# Patient Record
Sex: Male | Born: 1950
Health system: Southern US, Community
[De-identification: ages and names within clinical notes are randomized; demographics above are authoritative.]

## PROBLEM LIST (undated history)

## (undated) DIAGNOSIS — Z9861 Coronary angioplasty status: Secondary | ICD-10-CM

## (undated) DIAGNOSIS — J45909 Unspecified asthma, uncomplicated: Secondary | ICD-10-CM

## (undated) DIAGNOSIS — I2510 Atherosclerotic heart disease of native coronary artery without angina pectoris: Secondary | ICD-10-CM

## (undated) DIAGNOSIS — J069 Acute upper respiratory infection, unspecified: Secondary | ICD-10-CM

## (undated) DIAGNOSIS — E119 Type 2 diabetes mellitus without complications: Secondary | ICD-10-CM

## (undated) DIAGNOSIS — I214 Non-ST elevation (NSTEMI) myocardial infarction: Secondary | ICD-10-CM

## (undated) DIAGNOSIS — G629 Polyneuropathy, unspecified: Secondary | ICD-10-CM

## (undated) DIAGNOSIS — G709 Myoneural disorder, unspecified: Secondary | ICD-10-CM

## (undated) DIAGNOSIS — Z789 Other specified health status: Secondary | ICD-10-CM

## (undated) DIAGNOSIS — R0602 Shortness of breath: Secondary | ICD-10-CM

## (undated) DIAGNOSIS — F329 Major depressive disorder, single episode, unspecified: Secondary | ICD-10-CM

## (undated) DIAGNOSIS — N400 Enlarged prostate without lower urinary tract symptoms: Secondary | ICD-10-CM

## (undated) DIAGNOSIS — K219 Gastro-esophageal reflux disease without esophagitis: Secondary | ICD-10-CM

## (undated) DIAGNOSIS — E785 Hyperlipidemia, unspecified: Secondary | ICD-10-CM

## (undated) DIAGNOSIS — R350 Frequency of micturition: Secondary | ICD-10-CM

## (undated) DIAGNOSIS — M1990 Unspecified osteoarthritis, unspecified site: Secondary | ICD-10-CM

## (undated) DIAGNOSIS — F32A Depression, unspecified: Secondary | ICD-10-CM

## (undated) DIAGNOSIS — J209 Acute bronchitis, unspecified: Secondary | ICD-10-CM

## (undated) HISTORY — PX: MULTIPLE TOOTH EXTRACTIONS: SHX2053

## (undated) HISTORY — DX: Hyperlipidemia, unspecified: E78.5

## (undated) HISTORY — PX: HERNIA REPAIR: SHX51

## (undated) HISTORY — DX: Benign prostatic hyperplasia without lower urinary tract symptoms: N40.0

## (undated) HISTORY — PX: BACK SURGERY: SHX140

## (undated) HISTORY — PX: FINGER SURGERY: SHX640

## (undated) HISTORY — PX: CARDIAC CATHETERIZATION: SHX172

---

## 2003-08-22 ENCOUNTER — Encounter: Admission: RE | Admit: 2003-08-22 | Discharge: 2003-10-10 | Payer: Self-pay | Admitting: *Deleted

## 2003-11-15 ENCOUNTER — Emergency Department (HOSPITAL_COMMUNITY): Admission: EM | Admit: 2003-11-15 | Discharge: 2003-11-15 | Payer: Self-pay | Admitting: Emergency Medicine

## 2007-08-29 ENCOUNTER — Emergency Department (HOSPITAL_COMMUNITY): Admission: EM | Admit: 2007-08-29 | Discharge: 2007-08-29 | Payer: Self-pay | Admitting: Emergency Medicine

## 2007-09-19 ENCOUNTER — Encounter (INDEPENDENT_AMBULATORY_CARE_PROVIDER_SITE_OTHER): Payer: Self-pay | Admitting: General Surgery

## 2007-09-19 ENCOUNTER — Ambulatory Visit (HOSPITAL_COMMUNITY): Admission: RE | Admit: 2007-09-19 | Discharge: 2007-09-19 | Payer: Self-pay | Admitting: General Surgery

## 2010-12-30 NOTE — Consult Note (Signed)
Arthur Smith, Arthur Smith NO.:  [id number]   MEDICAL RECORD NO.:  [id number]          PATIENT TYPE:  EMS   LOCATION:  ED                           FACILITY:  [facility]   PHYSICIAN:  Lennie Muckle, MD      DATE OF BIRTH:  Dec 23, 1950   DATE OF CONSULTATION:  DATE OF DISCHARGE:                                 CONSULTATION   REASON FOR CONSULTATION:  Umbilical hernia.   HISTORY OF PRESENT ILLNESS:  Arthur Smith is a 60 year old male who has  had a longstanding history of an umbilical hernia.  Apparently in the  past couple of days he began having more severe abdominal pain and began  having redness within the past 24 hours of his umbilicus.  He has had no  nausea or vomiting.Marland Kitchen  He has had normal bowel movements and no fevers.  He has been performing abdominal exercises and weight loss recently. He  has been uncomfortable in the past 24 hours and during the pain he  sought treatment at his primary care physician's office and was then  instructed to report to the surgical office but due to a  miscommunication he came to the emergency room at [facility].   PAST MEDICAL HISTORY:  His past medical history is significant for  idiopathic neuropathy and chronic back pain.   PAST SURGICAL HISTORY:  None.   SOCIAL HISTORY:  Tobacco use: None.  He also has no history of alcohol  use.   MEDICATIONS:  Medications include Gabapentin 800 mg 4 times a day,  imipramine 25 mg at bedtime, Neurontin 100 mg 4 times a day, Ultram 50  mg 4 times a day, Vicodin as needed, and Zonegran 100 mg at bedtime.   PHYSICAL EXAMINATION:  GENERAL:  On physical examination he is a  pleasant male, lying on a stretcher, in no acute distress.  VITAL SIGNS:  Temperature is 98.2, blood pressure 137/97.  Pulse is 84.  CHEST: Clear to auscultation bilaterally.  CARDIOVASCULAR:  Regular rate and rhythm.  ABDOMEN:  Obese, is soft. He is tender in the umbilical region.  There  is erythema noted.  An  umbilical hernia is present, approximately a cm  to 2 cm, is not able to be fully reduced to evaluate full size.  No  rebound tenderness or peritoneal signs are noted.   ASSESSMENT AND PLAN:  Umbilical hernia, likely with omental adhesions,  incarcerated.  Agree with CT scan for further assessment just to rule  out bowel within the hernia.  I am doubtful that there is any intestinal  compromise since he is currently stable and has no peritoneal signs.  If  the CT scan does show intestine within the hernia may  have to entertain surgery intervention earlier than later.  The CT does  only show omentum.  The patient can be discharged home with narcotics  and follow up with the surgery clinic, either myself, Dr. Daphine Deutscher, or  other partners, for discussion of repair at that time.Lennie Muckle, MD  Electronically Signed  ALA/MEDQ  D:  08/29/2007  T:  08/30/2007  Job:  [id number]   cc:   Thora Lance, M.D.  Fax: [phone]

## 2010-12-30 NOTE — Op Note (Signed)
Arthur Smith, Arthur Smith               ACCOUNT NO.:  [id number]   MEDICAL RECORD NO.:  [id number]          PATIENT TYPE:  AMB   LOCATION:  DAY                          FACILITY:  [facility]   PHYSICIAN:  Lennie Muckle, MD      DATE OF BIRTH:  Apr 16, 1951   DATE OF PROCEDURE:  DATE OF DISCHARGE:                               OPERATIVE REPORT   DATE OF SURGERY:  09/19/2007   PREOPERATIVE DIAGNOSIS:  Umbilical hernia.   POSTOPERATIVE DIAGNOSIS:  Umbilical hernia with incarcerated omentum.   PROCEDURE:  1. Diagnostic laparoscopy.  2. Open hernia repair.   SURGEON:  Lennie Muckle, MD   ASSISTANT:  General endotracheal anesthesia.   FINDINGS:  A small less than 1 cm defect at the umbilical region with an  incarcerated omentum.   SPECIMEN:  Omentum was removed and sent to Pathology.   ESTIMATED BLOOD LOSS:  Minimal.   COMPLICATIONS:  None immediate.   DRAINS:  None placed.   INDICATIONS FOR PROCEDURE:  Arthur Smith is a 60 year old male whom I had  seen in the emergency department due to abdominal pain and erythema in  the umbilical region.  He was found to have an umbilical hernia on CT  scan which had an omentum within the hernia.  He was treated with  antibiotics due to erythema at the umbilical region discussed in the  office performing a laparoscopic umbilical hernia repair.  Due to his  abdominal girth, I did think he would be better served with the  laparoscopic approach.  Informed consent was obtained from the patient.   DETAILS OF PROCEDURE:  Arthur Smith was identified in the preoperative  holding suite.  He was given heparin before being taken to the operating  room.  He also received IV cefazolin.  Once in the operating room, he  was placed in the supine position and placed under general endotracheal  anesthesia.  A Foley catheter was placed.  Once his abdomen was prepped  and draped in the usual sterile fashion, a time out procedure indicating  the patient and procedure  that were performed.  After insuring  orogastric tube was in position, a Veress needle was placed in the left  upper quadrant.  Pneumoperitoneum was established.  A 5 mm trocar using  Optiview was placed in the left side of the abdomen.  There was no  evidence of injury upon placement of the trocar or the Veress needle.  The Veress needle was removed, and a 5 mm trocar was placed in the left  upper quadrant.  The mid trocar was upsized to a 11 mm trocar.  An  additional 5 mm trocar was placed in the left lower quadrant.  Upon  inspection of the abdominal cavity, there was an omental adhesion to the  umbilical region.  This was taken down with blunt dissection.  There did  seem to be a small twisting piece of omentum up into the umbilical  hernia site.  This was easily transected.  I further inspected the  abdominal wall.  The lesion appeared to be less than a  centimeter in  size by laparoscopy.  There was a hardness noted at the umbilical region  which was unable to be reduced.  I attempted to remove the omentum  within the umbilical site.  However, despite multiple attempts at  removing the omentum at the umbilical region,  I was unsuccesful.  Due  to the size of the defect and the omentum remaining at the hernia site,  I elected to perform an open procedure.  An incision was placed at the  umbilicus.  Umbilical stalk was dissected completely around.  The  omentum was able to be removed from the hernia site.  This was removed  from the operative field and passed off for pathology review.  I then  fully cleaned the fascial surrounding the defect.  A fingertip was  placed within the defect that was approximately 1 cm in size.  Using 0  Nurolon sutures, I repaired it primarily in an interrupted fashion.  After closure, the abdomen once again was insufflated, hernia repair was  inspected.  There was no evidence of bleeding throughout the abdominal  cavity.  The 11 mm trocar site was closed  with 0 Vicryl suture using  laparoscopic suture passer.  The peritoneum was then released.  The  umbilicus was tacked down to the fascia using a 3-0 Vicryl suture.  The  skin was closed with 4-0 Monocryl.  2 x 2s and Tegaderm were placed in  the umbilical region.  Steri-strips for final dressing on the remaining  port site.  The patient was extubated, Foley catheter removed, and taken  to the postanesthesia care unit  in stable condition.  He will be  discharged home with instructions to perform no heavy lifting greater  than 20 pounds for six weeks.  He will follow up with me in  approximately two or three weeks' time.  He may shower tomorrow and was  given Percocet for pain.      Lennie Muckle, MD  Electronically Signed     ALA/MEDQ  D:  09/19/2007  T:  09/19/2007  Job:  [id number]

## 2011-05-07 LAB — BASIC METABOLIC PANEL
BUN: 11
CO2: 22
Calcium: 8.5
Chloride: 104
Creatinine, Ser: 1.07
GFR calc Af Amer: >60
GFR calc non Af Amer: >60
Glucose, Bld: 82
Potassium: 3.5
Sodium: 135

## 2011-05-07 LAB — CBC
HCT: 40.5
Hemoglobin: 14.2
MCHC: 35.1
MCV: 86.7
Platelets: 250
RBC: 4.67
RDW: 13.4
WBC: 7.5

## 2011-05-07 LAB — DIFFERENTIAL
Basophils Absolute: 0
Basophils Relative: 1
Eosinophils Absolute: 0.2
Eosinophils Relative: 3
Lymphocytes Relative: 33
Lymphs Abs: 2.4
Monocytes Absolute: 0.8
Monocytes Relative: 11
Neutro Abs: 4
Neutrophils Relative %: 53

## 2011-09-01 ENCOUNTER — Other Ambulatory Visit: Payer: Self-pay | Admitting: Internal Medicine

## 2011-09-01 DIAGNOSIS — R1011 Right upper quadrant pain: Secondary | ICD-10-CM

## 2011-09-02 ENCOUNTER — Ambulatory Visit
Admission: RE | Admit: 2011-09-02 | Discharge: 2011-09-02 | Disposition: A | Discharge status: Home / Self Care | Payer: No Typology Code available for payment source | Source: Ambulatory Visit | Attending: Internal Medicine | Admitting: Internal Medicine

## 2011-09-02 DIAGNOSIS — R1011 Right upper quadrant pain: Secondary | ICD-10-CM

## 2011-09-21 ENCOUNTER — Other Ambulatory Visit: Payer: Self-pay | Admitting: Internal Medicine

## 2011-09-23 ENCOUNTER — Ambulatory Visit
Admission: RE | Admit: 2011-09-23 | Discharge: 2011-09-23 | Disposition: A | Discharge status: Home / Self Care | Payer: No Typology Code available for payment source | Source: Ambulatory Visit | Attending: Internal Medicine | Admitting: Internal Medicine

## 2011-09-23 MED ORDER — IOHEXOL 300 MG/ML  SOLN
125.0000 mL | Freq: Once | INTRAMUSCULAR | Status: AC | PRN
  Administered 2011-09-23: 125 mL via INTRAVENOUS

## 2011-10-22 ENCOUNTER — Other Ambulatory Visit: Payer: Self-pay | Admitting: Gastroenterology

## 2011-11-03 ENCOUNTER — Other Ambulatory Visit (HOSPITAL_COMMUNITY): Payer: Self-pay | Admitting: Gastroenterology

## 2011-11-03 DIAGNOSIS — K635 Polyp of colon: Secondary | ICD-10-CM

## 2011-11-16 DIAGNOSIS — J209 Acute bronchitis, unspecified: Secondary | ICD-10-CM

## 2011-11-16 DIAGNOSIS — J45909 Unspecified asthma, uncomplicated: Secondary | ICD-10-CM

## 2011-11-16 HISTORY — DX: Unspecified asthma, uncomplicated: J20.9

## 2011-11-16 HISTORY — DX: Acute bronchitis, unspecified: J45.909

## 2011-11-23 ENCOUNTER — Other Ambulatory Visit: Payer: Self-pay | Admitting: Neurosurgery

## 2011-11-24 ENCOUNTER — Encounter (HOSPITAL_COMMUNITY): Payer: Self-pay | Admitting: Pharmacy Technician

## 2011-11-30 ENCOUNTER — Inpatient Hospital Stay (HOSPITAL_COMMUNITY): Admission: RE | Admit: 2011-11-30 | Payer: No Typology Code available for payment source | Source: Ambulatory Visit

## 2011-12-02 ENCOUNTER — Encounter (HOSPITAL_COMMUNITY)
Admission: RE | Admit: 2011-12-02 | Discharge: 2011-12-02 | Disposition: A | Discharge status: Home / Self Care | Payer: No Typology Code available for payment source | Source: Ambulatory Visit | Attending: Neurosurgery | Admitting: Neurosurgery

## 2011-12-02 ENCOUNTER — Encounter (HOSPITAL_COMMUNITY): Payer: Self-pay

## 2011-12-02 ENCOUNTER — Encounter (HOSPITAL_COMMUNITY)
Admission: RE | Admit: 2011-12-02 | Discharge: 2011-12-02 | Disposition: A | Discharge status: Home / Self Care | Payer: No Typology Code available for payment source | Source: Ambulatory Visit | Attending: Anesthesiology | Admitting: Anesthesiology

## 2011-12-02 DIAGNOSIS — Z538 Procedure and treatment not carried out for other reasons: Secondary | ICD-10-CM | POA: Insufficient documentation

## 2011-12-02 DIAGNOSIS — Z01818 Encounter for other preprocedural examination: Secondary | ICD-10-CM | POA: Insufficient documentation

## 2011-12-02 DIAGNOSIS — Z01812 Encounter for preprocedural laboratory examination: Secondary | ICD-10-CM | POA: Insufficient documentation

## 2011-12-02 HISTORY — DX: Shortness of breath: R06.02

## 2011-12-02 HISTORY — DX: Frequency of micturition: R35.0

## 2011-12-02 HISTORY — DX: Myoneural disorder, unspecified: G70.9

## 2011-12-02 HISTORY — DX: Other specified health status: Z78.9

## 2011-12-02 HISTORY — DX: Depression, unspecified: F32.A

## 2011-12-02 HISTORY — DX: Major depressive disorder, single episode, unspecified: F32.9

## 2011-12-02 HISTORY — DX: Unspecified osteoarthritis, unspecified site: M19.90

## 2011-12-02 HISTORY — DX: Acute upper respiratory infection, unspecified: J06.9

## 2011-12-02 LAB — CBC
HCT: 35.9 % — ABNORMAL LOW (ref 39.0–52.0)
Hemoglobin: 12.3 g/dL — ABNORMAL LOW (ref 13.0–17.0)
MCH: 30.6 pg (ref 26.0–34.0)
MCHC: 34.3 g/dL (ref 30.0–36.0)
MCV: 89.3 fL (ref 78.0–100.0)
Platelets: 230 10*3/uL (ref 150–400)
RBC: 4.02 MIL/uL — ABNORMAL LOW (ref 4.22–5.81)
RDW: 13.6 % (ref 11.5–15.5)
WBC: 6.3 10*3/uL (ref 4.0–10.5)

## 2011-12-02 LAB — BASIC METABOLIC PANEL
BUN: 17 mg/dL (ref 6–23)
CO2: 23 mEq/L (ref 19–32)
Calcium: 9 mg/dL (ref 8.4–10.5)
Chloride: 104 mEq/L (ref 96–112)
Creatinine, Ser: 0.98 mg/dL (ref 0.50–1.35)
GFR calc Af Amer: >90 mL/min (ref >90)
GFR calc non Af Amer: 88 mL/min — ABNORMAL LOW (ref >90)
Glucose, Bld: 102 mg/dL — ABNORMAL HIGH (ref 70–99)
Potassium: 4.1 mEq/L (ref 3.5–5.1)
Sodium: 137 mEq/L (ref 135–145)

## 2011-12-02 LAB — SURGICAL PCR SCREEN
MRSA, PCR: NEGATIVE
Staphylococcus aureus: POSITIVE — AB

## 2011-12-02 NOTE — Progress Notes (Addendum)
TOE ON LEFT FOOT WITH HEALING SORE.    (PATIENTS MEDICAL MD IS DR Kirby Funk WITH [facility])

## 2011-12-02 NOTE — Pre-Procedure Instructions (Signed)
20 Arthur Smith  12/02/2011   Your procedure is scheduled on:  Tuesday 12/08/11   Report to [facility] at 645 AM.  Call this number if you have problems the morning of surgery: [phone]   Remember:   Do not eat food:After Midnight.  May have clear liquids: up to 4 Hours before arrival (UNTIL 245 AM)  Clear liquids include soda, tea, black coffee, apple or grape juice, broth.  Take these medicines the morning of surgery with A SIP OF WATER: NEURONTIN, ULTRAM, TOFRANIL (IMIPRAMINE)    STOP ASPIRIN, HERBAL MEDICINES    Do not wear jewelry, make-up or nail polish.  Do not wear lotions, powders, or perfumes. You may wear deodorant.  Do not shave 48 hours prior to surgery.  Do not bring valuables to the hospital.  Contacts, dentures or bridgework may not be worn into surgery.  Leave suitcase in the car. After surgery it may be brought to your room.  For patients admitted to the hospital, checkout time is 11:00 AM the day of discharge.   Patients discharged the day of surgery will not be allowed to drive home.  Name and phone number of your driver:  Special Instructions: CHG Shower Use Special Wash: 1/2 bottle night before surgery and 1/2 bottle morning of surgery.   Please read over the following fact sheets that you were given: Pain Booklet, Coughing and Deep Breathing, MRSA Information and Surgical Site Infection Prevention

## 2011-12-03 ENCOUNTER — Encounter (HOSPITAL_COMMUNITY): Payer: Self-pay | Admitting: Vascular Surgery

## 2011-12-03 NOTE — Consult Note (Addendum)
Anesthesia Chart Review:  Patient is a 61 year old male scheduled for a T10-11 laminectomy on 12/08/11.  History includes former smoker, arthritis, depression, dizziness, and peripheral neuropathy.  He is on anti-seizure medication, but there is no history of seizures documented in his health history.  From Neurology notes (Arthur Smith), it appears he is on these for cryptogenic/axonal polyneuropathy. Patient says this effects him below the knees only.  CXR on 12/02/11 showed bronchitic changes without definite infiltrate.  I called and spoke with Arthur Smith.  He is currently recovering from a "cold", and has had some DOE associated with this.  Sats 98% on 12/02/11.  He denies fever currently, but did cough quite a bit yesterday.  He is starting to feel better today. His surgery is on 12/08/11, so I advised him to contact his PCP Arthur Smith tomorrow or Monday if he is not feeling better, so he can be well for surgery.  I also asked him to update Arthur Smith if he is not feeling better.  He denied CP.  He has no cardiac history and denies ever having any cardiac studies done.  His EKG prior to his UHR showed NSR (32/209).  He did not meet Anesthesia's requirement for a preoperative EKG, and I do not see that one was ordered by his surgeon.  He denied history of anesthesia complication and difficult intubation.      Labs reviewed.  Plan to proceed if no worsening URI/respiratory symptoms.  Addendum: 12/04/11  1300  Patient called me today.  He was still not feeling well today and saw Arthur Smith who diagnosed him with asthmatic bronchitis.  He prescribed doxycycline X 7 days, prednisone taper X 8 days, Flonase BID, and albuterol MDI PRN.  Arthur Smith recommended delaying surgery X at least 1 week.  I updated Arthur Smith at Arthur Smith office, and have asked them to contact the patient regarding rescheduling his surgery.

## 2011-12-08 ENCOUNTER — Ambulatory Visit (HOSPITAL_COMMUNITY)
Admission: RE | Admit: 2011-12-08 | Payer: No Typology Code available for payment source | Source: Ambulatory Visit | Admitting: Neurosurgery

## 2011-12-08 ENCOUNTER — Encounter (HOSPITAL_COMMUNITY): Payer: Self-pay | Admitting: Respiratory Therapy

## 2011-12-08 ENCOUNTER — Encounter (HOSPITAL_COMMUNITY): Admission: RE | Payer: Self-pay | Source: Ambulatory Visit

## 2011-12-08 ENCOUNTER — Other Ambulatory Visit: Payer: Self-pay | Admitting: Neurosurgery

## 2011-12-08 SURGERY — LUMBAR LAMINECTOMY/DECOMPRESSION MICRODISCECTOMY 1 LEVEL
Anesthesia: General

## 2011-12-15 ENCOUNTER — Encounter (HOSPITAL_COMMUNITY)
Admission: RE | Admit: 2011-12-15 | Discharge: 2011-12-15 | Disposition: A | Discharge status: Home / Self Care | Payer: No Typology Code available for payment source | Source: Ambulatory Visit | Attending: Anesthesiology | Admitting: Anesthesiology

## 2011-12-15 ENCOUNTER — Encounter (HOSPITAL_COMMUNITY)
Admission: RE | Admit: 2011-12-15 | Discharge: 2011-12-15 | Disposition: A | Discharge status: Home / Self Care | Payer: No Typology Code available for payment source | Source: Ambulatory Visit | Attending: Neurosurgery | Admitting: Neurosurgery

## 2011-12-15 ENCOUNTER — Encounter (HOSPITAL_COMMUNITY): Payer: Self-pay

## 2011-12-15 LAB — BASIC METABOLIC PANEL
BUN: 22 mg/dL (ref 6–23)
CO2: 26 mEq/L (ref 19–32)
Calcium: 9 mg/dL (ref 8.4–10.5)
Chloride: 103 mEq/L (ref 96–112)
Creatinine, Ser: 0.99 mg/dL (ref 0.50–1.35)
GFR calc Af Amer: >90 mL/min (ref >90)
GFR calc non Af Amer: 87 mL/min — ABNORMAL LOW (ref >90)
Glucose, Bld: 167 mg/dL — ABNORMAL HIGH (ref 70–99)
Potassium: 4.4 mEq/L (ref 3.5–5.1)
Sodium: 137 mEq/L (ref 135–145)

## 2011-12-15 LAB — CBC
HCT: 38.8 % — ABNORMAL LOW (ref 39.0–52.0)
Hemoglobin: 13.3 g/dL (ref 13.0–17.0)
MCH: 30.9 pg (ref 26.0–34.0)
MCHC: 34.3 g/dL (ref 30.0–36.0)
MCV: 90.2 fL (ref 78.0–100.0)
Platelets: 307 10*3/uL (ref 150–400)
RBC: 4.3 MIL/uL (ref 4.22–5.81)
RDW: 14 % (ref 11.5–15.5)
WBC: 7.7 10*3/uL (ref 4.0–10.5)

## 2011-12-15 NOTE — Pre-Procedure Instructions (Signed)
20 Arthur Smith  12/15/2011   Your procedure is scheduled on:  Tuesday May 7  Report to [facility] at 8:15 AM.  Call this number if you have problems the morning of surgery: [phone]   Remember:   Do not eat food:After Midnight.  May have clear liquids: up to 4 Hours before arrival.  Clear liquids include soda, tea, black coffee, apple or grape juice, broth.  Take these medicines the morning of surgery with A SIP OF WATER: Neurontin, Tramadol. Stop aspirin, herbal medications.    Do not wear jewelry, make-up or nail polish.  Do not wear lotions, powders, or perfumes. You may wear deodorant.  Do not shave 48 hours prior to surgery.  Do not bring valuables to the hospital.  Contacts, dentures or bridgework may not be worn into surgery.  Leave suitcase in the car. After surgery it may be brought to your room.  For patients admitted to the hospital, checkout time is 11:00 AM the day of discharge.   Patients discharged the day of surgery will not be allowed to drive home.  Name and phone number of your driver: NA  Special Instructions: CHG Shower Use Special Wash: 1/2 bottle night before surgery and 1/2 bottle morning of surgery.   Please read over the following fact sheets that you were given: Pain Booklet, Coughing and Deep Breathing and Surgical Site Infection Prevention

## 2011-12-21 MED ORDER — CEFAZOLIN SODIUM-DEXTROSE 2-3 GM-% IV SOLR
2.0000 g | INTRAVENOUS | Status: AC
  Administered 2011-12-22: 2 g via INTRAVENOUS
  Filled 2011-12-21: qty 50

## 2011-12-21 NOTE — H&P (Signed)
NEUROSURGICAL CONSULTATION   Arthur Smith   DOB:  17-Mar-1951 #[id number]    November 23, 2011    HISTORY:     Arthur Smith is a 61 year old retired man out on disability who presents with back, right flank, and right upper abdominal pain.  He notes numbness going in both of his lower extremities and says this has been going on and has increased over the last three months.  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  He notes increased pain with activity.  He has also been diagnosed with neuropathy in 1995 and has bilateral lower extremity numbness as a result of that.  He describes that the pain around his abdomen and into his right upper quadrant has been going on for about three months and it is quite severe.    REVIEW OF SYSTEMS:   A detailed Review of Systems sheet was reviewed with the patient.  Pertinent positives include wears glasses, hearing loss, ringing in ears, urinary urgency without frank incontinence, back pain, and arthritis.  All other systems are negative; this includes Constitutional symptoms, Cardiovascular, Endocrine, Respiratory, Gastrointestinal, Integumentary & Breast, Neurologic, Psychiatric, Hematologic/Lymphatic, Allergic/Immunologic.    PAST MEDICAL HISTORY:      Current Medical Conditions:    As previously described above.      Prior Operations and Hospitalizations:   His past medical history is additionally significant for in 08/2009 he had an umbilical hernia repair by Dr. Freida Busman in 2011.      Medications and Allergies:  He has currently been taking Gabapentin 800 mg. qid along with 100 mg. qid for a total of 900 mg. qid and has been using Ultram 50 mg. two po qid, Imipramine 25 mg. six tablets q.h.s. for a total dose of 150 mg. and Zonegran 400 mg. q.h.s. for neuropathy.  No known drug allergies.      Height and Weight:     Currently Arthur Smith is a 5'9" tall, 207 lbs.    FAMILY  HISTORY:    Both parents deceased with a history in his mother of high blood pressure, lung cancer, and diabetes.  His father is deceased with a stroke.    SOCIAL HISTORY:    He denies tobacco or alcohol use.  He has a history of drug abuse and says that "years ago he smoked pot".    DIAGNOSTIC STUDIES:   He has had MRIs of his lumbar and thoracic spine, which were performed through [facility] on 10/23/2011 and were read by Dr. Marjory Lies.  The most noteworthy finding is at the T10-11 level there is bulging disc and facet hypertrophy causing moderate spinal stenosis with cord compression at T10-11, and with subtle hyperintensity on STIR images.  There is also some spondylosis noted and bulging disc noted at C5-6, C6-7, and C7-T1 levels.    His lumbar MRI shows some disc degeneration at L5-S1 with disc bulge and facet arthropathy and moderate right and severe left foraminal stenosis. At L3-4 there is disc bulging, facet hypertrophy, and moderate biforaminal stenosis.  At L2-3 and L4-5 there is disc bulging and facet hypertrophy with mild biforaminal stenosis.    Additional radiographs were reviewed including lumbar and thoracic radiographs which show lumbar spine degenerative changes and there appear to be laterally based osteophytes T11 through L3.  There does not appear to be significant spondylolisthesis on flexion and extension views.    PHYSICAL EXAMINATION:  General Appearance:   On examination today, Arthur Smith is a pleasant and cooperative man in no acute distress.      Blood Pressure, Pulse:     His blood pressure is 130/82.  Heart rate is 74 and regular.  Respiratory rate is 16.      HEENT - normocephalic, atraumatic.  The pupils are equal, round and reactive to light.  The extraocular muscles are intact.  Sclerae - white.  Conjunctiva - pink.  Oropharynx benign.  Uvula midline.     Neck - there are no masses, meningismus, deformities, tracheal deviation, jugular vein distention or  carotid bruits.  There is normal cervical range of motion.  Spurlings' test is negative without reproducible radicular pain turning the patient's head to either side.  Lhermitte's sign is not present with axial compression.      Respiratory - there is normal respiratory effort with good intercostal function.  Lungs are clear to auscultation.  There are no rales, rhonchi or wheezes.      Cardiovascular - the heart has regular rate and rhythm to auscultation.  No murmurs are appreciated.  There is no extremity edema, cyanosis or clubbing.  There are palpable pedal pulses.      Abdomen - soft, nontender, no hepatosplenomegaly appreciated or masses.  There are active bowel sounds.  No guarding or rebound.      Musculoskeletal Examination - He is somewhat clumsy with his gait. He is able to stand on his heels and toes.  He is mildly spastic in his lower extremities.    NEUROLOGICAL EXAMINATION: The patient is oriented to time, person and place and has good recall of both recent and remote memory with normal attention span and concentration.  The patient speaks with clear and fluent speech and exhibits normal language function and appropriate fund of knowledge.      Cranial Nerve Examination - pupils are equal, round and reactive to light.  Extraocular movements are full.  Visual fields are full to confrontational testing.  Facial sensation and facial movement are symmetric and intact.  Hearing is intact to finger rub.  Palate is upgoing.  Shoulder shrug is symmetric.  Tongue protrudes in the midline.      Motor Examination - motor strength is 5/5 in the bilateral deltoids, biceps, triceps, handgrips, wrist extensors, interosseous.  In the lower extremities motor strength is 5/5 in hip flexion, extension, quadriceps, hamstrings, plantar flexion, dorsiflexion and extensor hallucis longus.      Sensory Examination - he has decreased pin sensation in a right T10 distribution and also bilaterally below T12.   He notes further numbness below his left knee to pinprick and up to his right knee and just above the knee to pinprick. He has intact proprioception on the left great toe and decreased proprioception on the right great toe, and he has decreased vibratory sensation in both lower extremities.      Deep Tendon Reflexes - trace in the upper extremities, at the knees and absent at the ankles.    The great toes are downgoing to plantar stimulation.      Cerebellar Examination - normal coordination in upper and lower extremities and normal rapid alternating movements.  Romberg test is negative.    IMPRESSION AND RECOMMENDATIONS: Arthur Smith is a 61 year old retired man with thoracic radiculopathy with pain around his right lower abdomen and also a thoracic myelopathy secondary to significant spinal stenosis and cord compression at the T10 through T11 levels. He additionally has a  peripheral neuropathy which makes it difficult to assess theseverity of his myelopathy, although he notes that his urinary urgency is worsening and as his bilateral lower extremity numbness.    In light of these findings and significant cord compression, I have recommended that Arthur Smith undergo a decompressive laminectomy T10 through T11 for spinal stenosis. We talked about risks and benefits of surgery and my concern is that he has increased signal within his spinal cord and he is at risk for worsening myelopathy if he does not pursue surgery; however, the risk of surgery is weakness or paralysis.  He understands these risks. He wishes to proceed with surgery and this has been set up for 12/08/2011.  Risks and benefit were discussed in detail with the patient. We then went over models and gave him printed information regarding the planned surgery and answered his questions.    [facility]    Danae Orleans. Venetia Maxon, M.D.  JDS:aft cc: Dr. Lesia Sago  Dr. Kirby Funk

## 2011-12-22 ENCOUNTER — Encounter (HOSPITAL_COMMUNITY): Payer: Self-pay | Admitting: Anesthesiology

## 2011-12-22 ENCOUNTER — Ambulatory Visit (HOSPITAL_COMMUNITY): Payer: No Typology Code available for payment source | Admitting: Anesthesiology

## 2011-12-22 ENCOUNTER — Ambulatory Visit (HOSPITAL_COMMUNITY)
Admission: RE | Admit: 2011-12-22 | Discharge: 2011-12-23 | Disposition: A | Discharge status: Home / Self Care | Payer: No Typology Code available for payment source | Source: Ambulatory Visit | Attending: Neurosurgery | Admitting: Neurosurgery

## 2011-12-22 ENCOUNTER — Encounter (HOSPITAL_COMMUNITY)
Admission: RE | Disposition: A | Discharge status: Home / Self Care | Payer: Self-pay | Source: Ambulatory Visit | Attending: Neurosurgery

## 2011-12-22 ENCOUNTER — Ambulatory Visit (HOSPITAL_COMMUNITY): Payer: No Typology Code available for payment source

## 2011-12-22 ENCOUNTER — Encounter (HOSPITAL_COMMUNITY): Payer: Self-pay | Admitting: Surgery

## 2011-12-22 DIAGNOSIS — M4714 Other spondylosis with myelopathy, thoracic region: Secondary | ICD-10-CM | POA: Insufficient documentation

## 2011-12-22 DIAGNOSIS — R0602 Shortness of breath: Secondary | ICD-10-CM | POA: Insufficient documentation

## 2011-12-22 DIAGNOSIS — Z01812 Encounter for preprocedural laboratory examination: Secondary | ICD-10-CM | POA: Insufficient documentation

## 2011-12-22 DIAGNOSIS — Z01818 Encounter for other preprocedural examination: Secondary | ICD-10-CM | POA: Insufficient documentation

## 2011-12-22 DIAGNOSIS — M5104 Intervertebral disc disorders with myelopathy, thoracic region: Secondary | ICD-10-CM | POA: Insufficient documentation

## 2011-12-22 DIAGNOSIS — M129 Arthropathy, unspecified: Secondary | ICD-10-CM | POA: Insufficient documentation

## 2011-12-22 HISTORY — PX: LUMBAR LAMINECTOMY/DECOMPRESSION MICRODISCECTOMY: SHX5026

## 2011-12-22 HISTORY — DX: Acute bronchitis, unspecified: J20.9

## 2011-12-22 HISTORY — DX: Unspecified asthma, uncomplicated: J45.909

## 2011-12-22 LAB — URINALYSIS, ROUTINE W REFLEX MICROSCOPIC
Bilirubin Urine: NEGATIVE
Glucose, UA: >1000 mg/dL — AB
Hgb urine dipstick: NEGATIVE
Ketones, ur: NEGATIVE mg/dL
Leukocytes, UA: NEGATIVE
Nitrite: NEGATIVE
Protein, ur: NEGATIVE mg/dL
Specific Gravity, Urine: 1.015 (ref 1.005–1.030)
Urobilinogen, UA: 0.2 mg/dL (ref 0.0–1.0)
pH: 7.5 (ref 5.0–8.0)

## 2011-12-22 LAB — URINE MICROSCOPIC-ADD ON

## 2011-12-22 SURGERY — LUMBAR LAMINECTOMY/DECOMPRESSION MICRODISCECTOMY 1 LEVEL
Anesthesia: General | Site: Back | Wound class: Clean

## 2011-12-22 MED ORDER — PROPOFOL 10 MG/ML IV EMUL
INTRAVENOUS | Status: DC | PRN
  Administered 2011-12-22: 200 mg via INTRAVENOUS

## 2011-12-22 MED ORDER — CEFAZOLIN SODIUM 1-5 GM-% IV SOLN
1.0000 g | Freq: Three times a day (TID) | INTRAVENOUS | Status: AC
  Administered 2011-12-22 – 2011-12-23 (×2): 1 g via INTRAVENOUS
  Filled 2011-12-22 (×3): qty 50

## 2011-12-22 MED ORDER — ROCURONIUM BROMIDE 100 MG/10ML IV SOLN
INTRAVENOUS | Status: DC | PRN
  Administered 2011-12-22: 50 mg via INTRAVENOUS

## 2011-12-22 MED ORDER — GABAPENTIN 300 MG PO CAPS
900.0000 mg | ORAL_CAPSULE | Freq: Four times a day (QID) | ORAL | Status: DC
  Administered 2011-12-22 – 2011-12-23 (×3): 900 mg via ORAL
  Filled 2011-12-22 (×8): qty 3

## 2011-12-22 MED ORDER — IMIPRAMINE HCL 50 MG PO TABS
150.0000 mg | ORAL_TABLET | Freq: Every day | ORAL | Status: DC
  Administered 2011-12-22: 150 mg via ORAL
  Filled 2011-12-22 (×2): qty 3

## 2011-12-22 MED ORDER — KCL IN DEXTROSE-NACL 20-5-0.45 MEQ/L-%-% IV SOLN
INTRAVENOUS | Status: DC
  Administered 2011-12-22: 21:00:00 via INTRAVENOUS
  Filled 2011-12-22 (×3): qty 1000

## 2011-12-22 MED ORDER — ACETAMINOPHEN 650 MG RE SUPP: 650.0000 mg | RECTAL | Status: DC | PRN

## 2011-12-22 MED ORDER — ZOLPIDEM TARTRATE 10 MG PO TABS: 10.0000 mg | ORAL_TABLET | Freq: Every evening | ORAL | Status: DC | PRN

## 2011-12-22 MED ORDER — HYDROMORPHONE HCL PF 1 MG/ML IJ SOLN
0.2500 mg | INTRAMUSCULAR | Status: DC | PRN
  Administered 2011-12-22 (×2): 0.5 mg via INTRAVENOUS

## 2011-12-22 MED ORDER — ONDANSETRON HCL 4 MG/2ML IJ SOLN
INTRAMUSCULAR | Status: DC | PRN
  Administered 2011-12-22: 4 mg via INTRAVENOUS

## 2011-12-22 MED ORDER — 0.9 % SODIUM CHLORIDE (POUR BTL) OPTIME
TOPICAL | Status: DC | PRN
  Administered 2011-12-22: 1000 mL

## 2011-12-22 MED ORDER — HYDROMORPHONE HCL PF 1 MG/ML IJ SOLN
INTRAMUSCULAR | Status: AC
  Filled 2011-12-22: qty 1

## 2011-12-22 MED ORDER — LIDOCAINE HCL (CARDIAC) 20 MG/ML IV SOLN
INTRAVENOUS | Status: DC | PRN
  Administered 2011-12-22: 70 mg via INTRAVENOUS

## 2011-12-22 MED ORDER — SENNOSIDES-DOCUSATE SODIUM 8.6-50 MG PO TABS
3.0000 | ORAL_TABLET | Freq: Every day | ORAL | Status: DC
  Administered 2011-12-22: 3 via ORAL
  Filled 2011-12-22: qty 3

## 2011-12-22 MED ORDER — SODIUM CHLORIDE 0.9 % IV SOLN
INTRAVENOUS | Status: AC
  Filled 2011-12-22: qty 500

## 2011-12-22 MED ORDER — LIDOCAINE-EPINEPHRINE 1 %-1:100000 IJ SOLN
INTRAMUSCULAR | Status: DC | PRN
  Administered 2011-12-22: 5 mL

## 2011-12-22 MED ORDER — PHENOL 1.4 % MT LIQD: 1.0000 | OROMUCOSAL | Status: DC | PRN

## 2011-12-22 MED ORDER — ONDANSETRON HCL 4 MG/2ML IJ SOLN: 4.0000 mg | INTRAMUSCULAR | Status: DC | PRN

## 2011-12-22 MED ORDER — SODIUM CHLORIDE 0.9 % IJ SOLN: 3.0000 mL | Freq: Two times a day (BID) | INTRAMUSCULAR | Status: DC

## 2011-12-22 MED ORDER — PHENAZOPYRIDINE HCL 200 MG PO TABS
200.0000 mg | ORAL_TABLET | Freq: Three times a day (TID) | ORAL | Status: DC
  Administered 2011-12-22: 200 mg via ORAL
  Filled 2011-12-22 (×6): qty 1

## 2011-12-22 MED ORDER — GABAPENTIN 100 MG PO CAPS: 100.0000 mg | ORAL_CAPSULE | Freq: Four times a day (QID) | ORAL | Status: DC

## 2011-12-22 MED ORDER — NEOSTIGMINE METHYLSULFATE 1 MG/ML IJ SOLN
INTRAMUSCULAR | Status: DC | PRN
  Administered 2011-12-22: 4 mg via INTRAVENOUS

## 2011-12-22 MED ORDER — MORPHINE SULFATE 4 MG/ML IJ SOLN: 0.0500 mg/kg | INTRAMUSCULAR | Status: DC | PRN

## 2011-12-22 MED ORDER — ASPIRIN EC 81 MG PO TBEC
81.0000 mg | DELAYED_RELEASE_TABLET | Freq: Every day | ORAL | Status: DC
Start: 2011-12-22 — End: 2011-12-23
  Administered 2011-12-22 – 2011-12-23 (×2): 81 mg via ORAL
  Filled 2011-12-22 (×3): qty 1

## 2011-12-22 MED ORDER — MENTHOL 3 MG MT LOZG: 1.0000 | LOZENGE | OROMUCOSAL | Status: DC | PRN

## 2011-12-22 MED ORDER — PHENYLEPHRINE HCL 10 MG/ML IJ SOLN
INTRAMUSCULAR | Status: DC | PRN
  Administered 2011-12-22: 80 ug via INTRAVENOUS

## 2011-12-22 MED ORDER — LACTATED RINGERS IV SOLN
INTRAVENOUS | Status: DC | PRN
  Administered 2011-12-22 (×2): via INTRAVENOUS

## 2011-12-22 MED ORDER — GLYCOPYRROLATE 0.2 MG/ML IJ SOLN
INTRAMUSCULAR | Status: DC | PRN
  Administered 2011-12-22: .6 mg via INTRAVENOUS

## 2011-12-22 MED ORDER — OXYCODONE-ACETAMINOPHEN 5-325 MG PO TABS
1.0000 | ORAL_TABLET | ORAL | Status: DC | PRN
  Administered 2011-12-22 – 2011-12-23 (×4): 2 via ORAL
  Filled 2011-12-22 (×4): qty 2

## 2011-12-22 MED ORDER — SODIUM CHLORIDE 0.9 % IV SOLN: 250.0000 mL | INTRAVENOUS | Status: DC

## 2011-12-22 MED ORDER — BACITRACIN 50000 UNITS IM SOLR
INTRAMUSCULAR | Status: AC
  Filled 2011-12-22: qty 1

## 2011-12-22 MED ORDER — BUPIVACAINE HCL (PF) 0.5 % IJ SOLN
INTRAMUSCULAR | Status: DC | PRN
  Administered 2011-12-22: 5 mL

## 2011-12-22 MED ORDER — TRAMADOL HCL 50 MG PO TABS
100.0000 mg | ORAL_TABLET | Freq: Four times a day (QID) | ORAL | Status: DC
  Administered 2011-12-22 – 2011-12-23 (×3): 100 mg via ORAL
  Filled 2011-12-22 (×6): qty 2

## 2011-12-22 MED ORDER — HEMOSTATIC AGENTS (NO CHARGE) OPTIME
TOPICAL | Status: DC | PRN
  Administered 2011-12-22: 1 via TOPICAL

## 2011-12-22 MED ORDER — HYDROCODONE-ACETAMINOPHEN 5-325 MG PO TABS: 1.0000 | ORAL_TABLET | ORAL | Status: DC | PRN

## 2011-12-22 MED ORDER — SODIUM CHLORIDE 0.9 % IJ SOLN: 3.0000 mL | INTRAMUSCULAR | Status: DC | PRN

## 2011-12-22 MED ORDER — SODIUM CHLORIDE 0.9 % IR SOLN
Status: DC | PRN
  Administered 2011-12-22: 12:00:00

## 2011-12-22 MED ORDER — MIDAZOLAM HCL 5 MG/5ML IJ SOLN
INTRAMUSCULAR | Status: DC | PRN
  Administered 2011-12-22: 2 mg via INTRAVENOUS

## 2011-12-22 MED ORDER — ACETAMINOPHEN 325 MG PO TABS
650.0000 mg | ORAL_TABLET | ORAL | Status: DC | PRN
Start: 2011-12-22 — End: 2011-12-23

## 2011-12-22 MED ORDER — MORPHINE SULFATE 2 MG/ML IJ SOLN: 1.0000 mg | INTRAMUSCULAR | Status: DC | PRN

## 2011-12-22 MED ORDER — THROMBIN 5000 UNITS EX KIT
PACK | CUTANEOUS | Status: DC | PRN
  Administered 2011-12-22 (×2): 5000 [IU] via TOPICAL

## 2011-12-22 MED ORDER — ZONISAMIDE 100 MG PO CAPS
300.0000 mg | ORAL_CAPSULE | Freq: Every day | ORAL | Status: DC
  Administered 2011-12-22: 300 mg via ORAL
  Filled 2011-12-22 (×2): qty 3

## 2011-12-22 MED ORDER — EPHEDRINE SULFATE 50 MG/ML IJ SOLN
INTRAMUSCULAR | Status: DC | PRN
  Administered 2011-12-22 (×2): 10 mg via INTRAVENOUS

## 2011-12-22 MED ORDER — FENTANYL CITRATE 0.05 MG/ML IJ SOLN
INTRAMUSCULAR | Status: DC | PRN
  Administered 2011-12-22: 150 ug via INTRAVENOUS

## 2011-12-22 MED ORDER — DEXAMETHASONE SODIUM PHOSPHATE 4 MG/ML IJ SOLN
INTRAMUSCULAR | Status: DC | PRN
  Administered 2011-12-22: 10 mg via INTRAVENOUS

## 2011-12-22 MED ORDER — GABAPENTIN 800 MG PO TABS: 800.0000 mg | ORAL_TABLET | Freq: Four times a day (QID) | ORAL | Status: DC

## 2011-12-22 MED ORDER — DOCUSATE SODIUM 100 MG PO CAPS
100.0000 mg | ORAL_CAPSULE | Freq: Two times a day (BID) | ORAL | Status: DC
  Administered 2011-12-22 – 2011-12-23 (×3): 100 mg via ORAL
  Filled 2011-12-22 (×5): qty 1

## 2011-12-22 SURGICAL SUPPLY — 65 items
BAG DECANTER FOR FLEXI CONT (MISCELLANEOUS) ×2 IMPLANT
BENZOIN TINCTURE PRP APPL 2/3 (GAUZE/BANDAGES/DRESSINGS) IMPLANT
BIT DRILL NEURO 2X3.1 SFT TUCH (MISCELLANEOUS) ×1 IMPLANT
BLADE SURG ROTATE 9660 (MISCELLANEOUS) IMPLANT
BUR ROUND FLUTED 5 RND (BURR) ×2 IMPLANT
CANISTER SUCTION 2500CC (MISCELLANEOUS) ×2 IMPLANT
CLOTH BEACON ORANGE TIMEOUT ST (SAFETY) ×2 IMPLANT
CONT SPEC 4OZ CLIKSEAL STRL BL (MISCELLANEOUS) ×2 IMPLANT
DERMABOND ADVANCED (GAUZE/BANDAGES/DRESSINGS) ×1
DERMABOND ADVANCED .7 DNX12 (GAUZE/BANDAGES/DRESSINGS) ×1 IMPLANT
DRAPE LAPAROTOMY 100X72X124 (DRAPES) ×2 IMPLANT
DRAPE MICROSCOPE LEICA (MISCELLANEOUS) ×2 IMPLANT
DRAPE POUCH INSTRU U-SHP 10X18 (DRAPES) ×2 IMPLANT
DRAPE SURG 17X23 STRL (DRAPES) ×2 IMPLANT
DRESSING TELFA 8X3 (GAUZE/BANDAGES/DRESSINGS) ×2 IMPLANT
DRILL NEURO 2X3.1 SOFT TOUCH (MISCELLANEOUS) ×2
DURAPREP 26ML APPLICATOR (WOUND CARE) ×2 IMPLANT
ELECT BLADE 4.0 EZ CLEAN MEGAD (MISCELLANEOUS) ×2
ELECT REM PT RETURN 9FT ADLT (ELECTROSURGICAL) ×2
ELECTRODE BLDE 4.0 EZ CLN MEGD (MISCELLANEOUS) ×1 IMPLANT
ELECTRODE REM PT RTRN 9FT ADLT (ELECTROSURGICAL) ×1 IMPLANT
EVACUATOR 1/8 PVC DRAIN (DRAIN) ×2 IMPLANT
GAUZE SPONGE 4X4 16PLY XRAY LF (GAUZE/BANDAGES/DRESSINGS) IMPLANT
GLOVE BIO SURGEON STRL SZ8 (GLOVE) ×2 IMPLANT
GLOVE BIOGEL M 8.0 STRL (GLOVE) ×2 IMPLANT
GLOVE BIOGEL PI IND STRL 7.5 (GLOVE) ×2 IMPLANT
GLOVE BIOGEL PI IND STRL 8 (GLOVE) ×1 IMPLANT
GLOVE BIOGEL PI IND STRL 8.5 (GLOVE) ×1 IMPLANT
GLOVE BIOGEL PI INDICATOR 7.5 (GLOVE) ×2
GLOVE BIOGEL PI INDICATOR 8 (GLOVE) ×1
GLOVE BIOGEL PI INDICATOR 8.5 (GLOVE) ×1
GLOVE ECLIPSE 7.5 STRL STRAW (GLOVE) ×6 IMPLANT
GLOVE ECLIPSE 8.0 STRL XLNG CF (GLOVE) ×2 IMPLANT
GLOVE EXAM NITRILE LRG STRL (GLOVE) IMPLANT
GLOVE EXAM NITRILE MD LF STRL (GLOVE) IMPLANT
GLOVE EXAM NITRILE XL STR (GLOVE) IMPLANT
GLOVE EXAM NITRILE XS STR PU (GLOVE) IMPLANT
GLOVE SS BIOGEL STRL SZ 7 (GLOVE) ×2 IMPLANT
GLOVE SUPERSENSE BIOGEL SZ 7 (GLOVE) ×2
GOWN BRE IMP SLV AUR LG STRL (GOWN DISPOSABLE) ×2 IMPLANT
GOWN BRE IMP SLV AUR XL STRL (GOWN DISPOSABLE) ×4 IMPLANT
GOWN STRL REIN 2XL LVL4 (GOWN DISPOSABLE) ×2 IMPLANT
KIT BASIN OR (CUSTOM PROCEDURE TRAY) ×2 IMPLANT
KIT ROOM TURNOVER OR (KITS) ×2 IMPLANT
NEEDLE HYPO 18GX1.5 BLUNT FILL (NEEDLE) IMPLANT
NEEDLE HYPO 25X1 1.5 SAFETY (NEEDLE) ×2 IMPLANT
NEEDLE SPNL 18GX3.5 QUINCKE PK (NEEDLE) ×8 IMPLANT
NS IRRIG 1000ML POUR BTL (IV SOLUTION) ×2 IMPLANT
PACK LAMINECTOMY NEURO (CUSTOM PROCEDURE TRAY) ×2 IMPLANT
PAD ARMBOARD 7.5X6 YLW CONV (MISCELLANEOUS) ×6 IMPLANT
RUBBERBAND STERILE (MISCELLANEOUS) ×4 IMPLANT
SPONGE GAUZE 4X4 12PLY (GAUZE/BANDAGES/DRESSINGS) ×2 IMPLANT
SPONGE SURGIFOAM ABS GEL SZ50 (HEMOSTASIS) ×2 IMPLANT
STAPLER SKIN PROX WIDE 3.9 (STAPLE) IMPLANT
STRIP CLOSURE SKIN 1/2X4 (GAUZE/BANDAGES/DRESSINGS) IMPLANT
SUT VIC AB 0 CT1 18XCR BRD8 (SUTURE) ×1 IMPLANT
SUT VIC AB 0 CT1 8-18 (SUTURE) ×1
SUT VIC AB 2-0 CT1 18 (SUTURE) ×2 IMPLANT
SUT VIC AB 3-0 SH 8-18 (SUTURE) ×2 IMPLANT
SYR 20ML ECCENTRIC (SYRINGE) ×2 IMPLANT
SYR 5ML LL (SYRINGE) IMPLANT
TAPE CLOTH SURG 4X10 WHT LF (GAUZE/BANDAGES/DRESSINGS) ×2 IMPLANT
TOWEL OR 17X24 6PK STRL BLUE (TOWEL DISPOSABLE) ×2 IMPLANT
TOWEL OR 17X26 10 PK STRL BLUE (TOWEL DISPOSABLE) ×2 IMPLANT
WATER STERILE IRR 1000ML POUR (IV SOLUTION) ×2 IMPLANT

## 2011-12-22 NOTE — Op Note (Signed)
12/22/2011  12:41 PM  PATIENT:  Arthur Smith  61 y.o. male  PRE-OPERATIVE DIAGNOSIS:  thoracic stenosis thoracic spondylosis with myelopathy  thoracic herniated disc thoracic radiculopathy T 10 - T 11  POST-OPERATIVE DIAGNOSIS: thoracic stenosis thoracic spondylosis with myelopathy  thoracic herniated disc thoracic radiculopathy T 10 - T 11   PROCEDURE:  Procedure(s) (LRB): Thoracic LAMINECTOMY/DECOMPRESSIONT 10 - T 11 (N/A)  SURGEON:  Surgeon(s) and Role:    * Maeola Harman, MD - Primary    * Karn Cassis, MD - Assisting  PHYSICIAN ASSISTANT:   ASSISTANTS: Poteat, RN   ANESTHESIA:   general  EBL:  Total I/O In: 1000 [I.V.:1000] Out: 100 [Blood:100]  BLOOD ADMINISTERED:none  DRAINS: (Medium) Hemovact drain(s) in the epidural with  Suction Open   LOCAL MEDICATIONS USED:  LIDOCAINE   SPECIMEN:  No Specimen  DISPOSITION OF SPECIMEN:  N/A  COUNTS:  YES  TOURNIQUET:  * No tourniquets in log *  DICTATION: DICTATION: Patient has severe spinal stenosis at T 10 - 11 with myelopathy and thoracic radiculopathy. It was elected to take him to surgery for T 10 -  11  laminectomy.  Procedure: Patient was brought to the operating room and following the smooth and uncomplicated induction of general endotracheal anesthesia he was placed in a prone position on the Wilson frame. Mid to Low back was prepped and draped in the usual sterile fashion with DuraPrep. Preoperative radiograph was obtained prior to prepping with 4 18 gauge spinal needles taped to the skin.  Counting up from the sacrum, the upper most spinal needle was found to be overlying the T 10 -11 level. Area of planned incision was infiltrated with local lidocaine. Incision was made in the midline and carried to the thoracic fascia which was incised on the right side of midline. Subperiosteal dissection was performed exposing what was felt to be T 10 and T 11 levels. Intraoperative x-ray demonstrated marker probes at these  levels.  A total laminectomy of T 10  and T 11 was performed with leksell, then a high-speed drill and completed with Kerrison rongeurs to decompress the lateral aspect of the spinal cord and neural foraminae. Angled curettes were used to detatch and then remove thickened compressive ligamentous material.  At this point it was felt that all neural elements were well decompressed. The wound was then irrigated with bacitracin saline. Hemostasis was assured with bipolar electrocautery and , gelfoam and bone wax. A medium Hemovac drain was placed through a separate stab incision. The thoacoodorsal fascia was closed with 0 Vicryl sutures the subcutaneous tissues reapproximated 2-0 Vicryl inverted sutures and the skin edges were reapproximated with 3-0 Vicryl subcuticular stitch. The wound is dressed with Dermabond. Patient was extubated in the operating room and taken to recovery in stable and satisfactory condition having tolerated his operation well counts were correct at the end of the case.   PLAN OF CARE: Admit to inpatient   PATIENT DISPOSITION:  PACU - hemodynamically stable.   Delay start of Pharmacological VTE agent (>24hrs) due to surgical blood loss or risk of bleeding: yes

## 2011-12-22 NOTE — Progress Notes (Signed)
Patient complaining of increased urgency to urinate, pain upon urination, and bladder spasms.  Called Dr. Jeral Fruit who ordered urine culture and pyridium for spasms.  I did a bladder scan wichindicated 300 cc .  Explained to patient the plan to culture his urine and the pyridium to help with symptoms.  Patient is able to void so encouraged him to keep voiding as he feels necessary.  Patient is using the urinal.  Will continue to monitor situation.

## 2011-12-22 NOTE — Anesthesia Preprocedure Evaluation (Signed)
Anesthesia Evaluation    Reviewed: Allergy & Precautions, H&P , NPO status , Patient's Chart, lab work & pertinent test results  Airway Mallampati: II      Dental   Pulmonary shortness of breath and with exertion, Recent URI , Resolved,  breath sounds clear to auscultation        Cardiovascular negative cardio ROS  Rhythm:Regular Rate:Normal     Neuro/Psych    GI/Hepatic negative GI ROS, Neg liver ROS,   Endo/Other  negative endocrine ROS  Renal/GU negative Renal ROS     Musculoskeletal  (+) Arthritis -,   Abdominal   Peds  Hematology negative hematology ROS (+)   Anesthesia Other Findings   Reproductive/Obstetrics                           Anesthesia Physical Anesthesia Plan  ASA: II  Anesthesia Plan: General   Post-op Pain Management:    Induction: Intravenous  Airway Management Planned: Oral ETT  Additional Equipment:   Intra-op Plan:   Post-operative Plan: Extubation in OR  Informed Consent:   Plan Discussed with: CRNA  Anesthesia Plan Comments:         Anesthesia Quick Evaluation

## 2011-12-22 NOTE — Progress Notes (Signed)
Pt. C/oing of pain in abdomen, not firm and pt. Not feeling any difference in size of abdomen.

## 2011-12-22 NOTE — Transfer of Care (Signed)
Immediate Anesthesia Transfer of Care Note  Patient: Arthur Smith  Procedure(s) Performed: Procedure(s) (LRB): LUMBAR LAMINECTOMY/DECOMPRESSION MICRODISCECTOMY 1 LEVEL (N/A)  Patient Location: PACU  Anesthesia Type: General  Level of Consciousness: awake, alert  and oriented  Airway & Oxygen Therapy: Patient Spontanous Breathing and Patient connected to nasal cannula oxygen  Post-op Assessment: Report given to PACU RN, Post -op Vital signs reviewed and stable and Patient moving all extremities X 4  Post vital signs: Reviewed and stable  Complications: No apparent anesthesia complications

## 2011-12-22 NOTE — Anesthesia Postprocedure Evaluation (Signed)
  Anesthesia Post-op Note  Patient: Arthur Smith  Procedure(s) Performed: Procedure(s) (LRB): LUMBAR LAMINECTOMY/DECOMPRESSION MICRODISCECTOMY 1 LEVEL (N/A)  Patient Location: PACU  Anesthesia Type: General  Level of Consciousness: awake  Airway and Oxygen Therapy: Patient Spontanous Breathing  Post-op Pain: mild  Post-op Assessment: Post-op Vital signs reviewed  Post-op Vital Signs: Reviewed  Complications: No apparent anesthesia complications

## 2011-12-22 NOTE — Anesthesia Procedure Notes (Signed)
Procedure Name: Intubation Date/Time: 12/22/2011 11:00 AM Performed by: Quentin Ore Pre-anesthesia Checklist: Patient identified, Emergency Drugs available, Suction available, Patient being monitored and Timeout performed Patient Re-evaluated:Patient Re-evaluated prior to inductionOxygen Delivery Method: Circle system utilized Preoxygenation: Pre-oxygenation with 100% oxygen Intubation Type: IV induction Ventilation: Mask ventilation without difficulty and Oral airway inserted - appropriate to patient size Laryngoscope Size: Mac and 3 Grade View: Grade I Tube type: Oral Tube size: 7.5 mm Number of attempts: 1 Airway Equipment and Method: Stylet Placement Confirmation: ETT inserted through vocal cords under direct vision,  positive ETCO2 and breath sounds checked- equal and bilateral Secured at: 22 cm Tube secured with: Tape Dental Injury: Teeth and Oropharynx as per pre-operative assessment

## 2011-12-22 NOTE — Interval H&P Note (Signed)
History and Physical Interval Note:  12/22/2011 10:36 AM  Arthur Smith  has presented today for surgery, with the diagnosis of thoracic stenosis thoracic spondylosis thoracic herniated disc thoracic radiculopathy  The various methods of treatment have been discussed with the patient and family. After consideration of risks, benefits and other options for treatment, the patient has consented to  Procedure(s) (LRB): LUMBAR LAMINECTOMY/DECOMPRESSION MICRODISCECTOMY 1 LEVEL (N/A) as a surgical intervention .  The patients' history has been reviewed, patient examined, no change in status, stable for surgery.  I have reviewed the patients' chart and labs.  Questions were answered to the patient's satisfaction.     [name]  Date of Initial H&P: 11/23/2011  History reviewed, patient examined, no change in status, stable for surgery.

## 2011-12-22 NOTE — Progress Notes (Signed)
Awake, alert, conversant.  MAEW with good power 

## 2011-12-22 NOTE — Preoperative (Signed)
Beta Blockers   Reason not to administer Beta Blockers:Not Applicable 

## 2011-12-22 NOTE — Progress Notes (Signed)
Patient ID: Arthur Smith, male   DOB: 01/21/1951, 61 y.o.   MRN: [id number]  Alert, conversant. No c/o pain at present. Drain patent, minimal blood present. Good strength BLE.   Georgiann Cocker, RN, BSN

## 2011-12-23 ENCOUNTER — Encounter (HOSPITAL_COMMUNITY): Payer: Self-pay | Admitting: Neurosurgery

## 2011-12-23 NOTE — Evaluation (Signed)
Physical Therapy Evaluation Patient Details Name: Arthur Smith MRN: [id number] DOB: 07-Mar-1951 Today's Date: 12/23/2011 Time: 4098-1191 PT Time Calculation (min): 25 min  PT Assessment / Plan / Recommendation Clinical Impression  Pt is 61 y/o male admitted for s/p thoracic decompression.  Pt moving well however needs constant cues to prevent twisting.  Will continue to follow to improve mobility and continuet to educate on back precautions.    PT Assessment  Patient needs continued PT services    Follow Up Recommendations  No PT follow up    Equipment Recommendations  3 in 1 bedside comode    Frequency Min 5X/week    Precautions / Restrictions Precautions Precautions: Back   Pertinent Vitals/Pain 5/10 back pain       Mobility  Bed Mobility Bed Mobility: Rolling Left;Left Sidelying to Sit Rolling Left: 5: Supervision Left Sidelying to Sit: 5: Supervision;HOB flat;With rails Details for Bed Mobility Assistance: Superivsion for safety with cues for proper technique to prevent twisting motion. Transfers Transfers: Sit to Stand;Stand to Sit Sit to Stand: 4: Min guard;From bed;From chair/3-in-1;With armrests Stand to Sit: 4: Min guard;To bed Details for Transfer Assistance: Minguard for safety with cues for hand placement Ambulation/Gait Ambulation/Gait Assistance: 5: Supervision Ambulation Distance (Feet): 200 Feet Assistive device: None Ambulation/Gait Assistance Details: Slow, guarded gait with wide BOS.  Minguard for safety especially with turns. Gait Pattern: Decreased stride length;Decreased trunk rotation;Wide base of support Gait velocity: decrease gait speed Stairs: Yes Stairs Assistance: 4: Min guard Stair Management Technique: Two rails;Alternating pattern;Forwards Number of Stairs: 3     Exercises     PT Goals Acute Rehab PT Goals PT Goal Formulation: With patient Time For Goal Achievement: 12/30/11 Potential to Achieve Goals: Good Pt will Roll  Supine to Left Side: with modified independence PT Goal: Rolling Supine to Left Side - Progress: Goal set today Pt will go Supine/Side to Sit: with modified independence PT Goal: Supine/Side to Sit - Progress: Goal set today Pt will go Sit to Supine/Side: with modified independence PT Goal: Sit to Supine/Side - Progress: Goal set today Pt will go Sit to Stand: with modified independence PT Goal: Sit to Stand - Progress: Goal set today Pt will go Stand to Sit: with modified independence PT Goal: Stand to Sit - Progress: Goal set today Pt will Ambulate: >150 feet;with modified independence PT Goal: Ambulate - Progress: Goal set today Pt will Go Up / Down Stairs: 3-5 stairs;with modified independence PT Goal: Up/Down Stairs - Progress: Goal set today Additional Goals Additional Goal #1: Pt will be able to recall and adhere to 3/3 back precautions. PT Goal: Additional Goal #1 - Progress: Goal set today  Visit Information  Last PT Received On: 12/23/11 Assistance Needed: +1    Subjective Data  Subjective: "I've been up and moving last night." Patient Stated Goal: To return home and being more active.  To make it to [place] to visit my brother in June.   Prior Functioning  Home Living Lives With: Spouse Available Help at Discharge: Family Type of Home: House Home Access: Stairs to enter Secretary/administrator of Steps: 5 Entrance Stairs-Rails: Can reach both Home Layout: One level Bathroom Shower/Tub: Forensic scientist: Standard Bathroom Accessibility: Yes How Accessible: Accessible via walker Home Adaptive Equipment: None Prior Function Level of Independence: Independent Able to Take Stairs?: Yes Driving: Yes Vocation: Retired Musician: No difficulties Dominant Hand: Right    Cognition  Overall Cognitive Status: Appears within functional limits for tasks assessed/performed  Arousal/Alertness: Awake/alert Orientation Level: Appears  intact for tasks assessed Behavior During Session: [facility] for tasks performed    Extremity/Trunk Assessment Right Upper Extremity Assessment RUE ROM/Strength/Tone: Within functional levels Left Upper Extremity Assessment LUE ROM/Strength/Tone: Within functional levels Right Lower Extremity Assessment RLE ROM/Strength/Tone: Within functional levels Left Lower Extremity Assessment LLE ROM/Strength/Tone: Within functional levels Trunk Assessment Trunk Assessment: Normal   Balance Balance Balance Assessed: Yes Static Sitting Balance Static Sitting - Balance Support: Feet supported Static Sitting - Level of Assistance: 6: Modified independent (Device/Increase time)  End of Session PT - End of Session Equipment Utilized During Treatment: Gait belt Activity Tolerance: Patient tolerated treatment well Patient left: in chair;with call bell/phone within reach Nurse Communication: Mobility status   Candice Lunney 12/23/2011, 9:05 AM Jake Shark, PT DPT [phone]

## 2011-12-23 NOTE — Discharge Summary (Signed)
Physician Discharge Summary  Patient ID: Arthur Smith MRN: [id number] DOB/AGE: October 25, 1950 61 y.o.  Admit date: 12/22/2011 Discharge date: 12/23/2011  Admission Diagnoses: thoracic stenosis thoracic spondylosis with myelopathy thoracic herniated disc thoracic radiculopathy T 10 - T 11   Discharge Diagnoses:  thoracic stenosis thoracic spondylosis with myelopathy thoracic herniated disc thoracic radiculopathy T 10 - T 11 s/p Thoracic LAMINECTOMY/DECOMPRESSIONT 10 - T 11   Active Problems:  * No active hospital problems. *    Discharged Condition: good  Hospital Course: Arthur Smith is a 60 year old retired man out on disability who presented to office with back, right flank, and right upper abdominal pain.  He notes numbness going in both of his lower extremities and says this has been going on and has increased over the last three months.  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  He notes increased pain with activity.  He has also been diagnosed with neuropathy in 1995 and has bilateral lower extremity numbness as a result of that.  He describes that the pain around his abdomen and into his right upper quadrant has been going on for about three months and it is quite severe. He was admitted for surgery on 12/22/11. Following an uncomplicated thoracic laminectomy, he recovered in Neuro PACU before transfer to 3000. Drain output was minimal before removal this am. He experienced some urinary frequency through the night. U/A found to be negative except Glucose. I&O cath yielded ~400cc this am. He will f/u with Urology on o/p basis.   Consults: None  Significant Diagnostic Studies: radiology: X-Ray: intra-operative  Treatments: surgery: Thoracic LAMINECTOMY/DECOMPRESSIONT 10 - T 11    Discharge Exam: Blood pressure 109/55, pulse 89, temperature 98 F (36.7 C), temperature source Oral, resp. rate 20, height 5\' 8"   (1.727 m), weight 97.3 kg (214 lb 8.1 oz), SpO2 97.00%. Alert, conversant. No numbness, tingling LE. No thoracic, lumbar pain. Drain empty this am after 10cc emptied last night- pulled - DSD to site.  Site without erythema, swelling. Good strength BLE. U/A negative (elevated glucose). Pt will f/u with [facility] on O/P basis, # given.     Disposition: Per Dr. Venetia Maxon, d/c to home. Rx's given: Percocet, Neurontin, Flomax. [facility] # given for pt to call for appt. Pt will also call [facility] office to schedule 3-4 wk f/u appt. Pt verbalizes understanding of d/c instructions.   Medication List  As of 12/23/2011  8:35 AM   ASK your doctor about these medications         aspirin EC 81 MG tablet   Take 81 mg by mouth daily.      gabapentin 100 MG capsule   Commonly known as: NEURONTIN   Take 100 mg by mouth 4 (four) times daily.      gabapentin 800 MG tablet   Commonly known as: NEURONTIN   Take 800 mg by mouth 4 (four) times daily.      imipramine 25 MG tablet   Commonly known as: TOFRANIL   Take 150 mg by mouth at bedtime.      SENNA S 8.6-50 MG per tablet   Generic drug: senna-docusate   Take 3 tablets by mouth at bedtime.      traMADol 50 MG tablet   Commonly known as: ULTRAM   Take 100 mg by mouth 4 (four) times daily.      zonisamide 100 MG capsule  Commonly known as: ZONEGRAN   Take 300 mg by mouth at bedtime.             Signed: Georgiann Cocker 12/23/2011, 8:35 AM

## 2011-12-23 NOTE — Progress Notes (Signed)
Subjective: Patient reports "My legs and back don't hurt. I have a little discomfort low in front, especially when I urinate"  Objective: Vital signs in last 24 hours: Temp:  [97.5 F (36.4 C)-98.2 F (36.8 C)] 98 F (36.7 C) (05/08 0600) Pulse Rate:  [72-107] 89  (05/08 0600) Resp:  [11-20] 20  (05/08 0600) BP: (109-144)/(55-91) 109/55 mmHg (05/08 0600) SpO2:  [92 %-99 %] 97 % (05/08 0600) Weight:  [97.3 kg (214 lb 8.1 oz)] 97.3 kg (214 lb 8.1 oz) (05/08 0346)  Intake/Output from previous day: 05/07 0701 - 05/08 0700 In: 1410 [I.V.:1400] Out: 1800 [Urine:1700; Blood:100] Intake/Output this shift:    Alert, conversant. No numbness, tingling LE. No thoracic, lumbar pain. Drain empty this am after 10cc emptied last night. Site without erythema, swelling. Drsg dry. Good strength BLE. U/A negative (elevated glucose).  Lab Results: No results found for this basename: WBC:2,HGB:2,HCT:2,PLT:2 in the last 72 hours BMET No results found for this basename: NA:2,K:2,CL:2,CO2:2,GLUCOSE:2,BUN:2,CREATININE:2,CALCIUM:2 in the last 72 hours  Studies/Results: Dg Thoracolumabar Spine  12/22/2011  *RADIOLOGY REPORT*  Clinical Data: Back pain  THORACOLUMBAR SPINE - 2 VIEW  Comparison: Thoracic spine films 11/23/2011  Findings: Prone film #1 demonstrates multiple linear metallic densities overlying the lumbar spine to allow for accurate thoracic spine numbering.  Film #2 demonstrates angled probes overlying the left T10-11 and left T9- T10 interspaces.  Five lumbar type few bodies are assumed.  IMPRESSION: As above.  Original Report Authenticated By: Elsie Stain, M.D.    Assessment/Plan: Improved, Drain pulled, approximately 10cc thin bloody drainange from site. DSD to site. Incision remains with Dermabond.   LOS: 1 day  Plan to D/C to home. Pt will call if urinary s/s persist. Pt knows to call with s/s infection at site; verballizes understanding of d/c wound care & activity instructions. Pt will  call office to schedule 3wk f/u with Dr. Venetia Maxon in office. Rx's will be given with Dr. Fredrich Birks visit.   Georgiann Cocker 12/23/2011, 7:42 AM

## 2011-12-23 NOTE — Progress Notes (Signed)
Patient still complaining of abdominal pain and frequent urination.  Urinalysis came back negative.  Patient took first dose of pyridium overnight (see MAR).  Has been urinating overnight quite frequently.  Repeated bladder scan which showed 374 cc.  Offered to do in and out catherterization to help relive pain and patient refused.  Will continue to monitor and pass along information to the next shift.

## 2011-12-23 NOTE — Progress Notes (Signed)
Pt d/c in stable condition. Instructions reviewed with patient and family. Pt urged to follow up asap with urologist.

## 2012-03-22 ENCOUNTER — Other Ambulatory Visit: Payer: No Typology Code available for payment source

## 2012-03-24 ENCOUNTER — Ambulatory Visit (INDEPENDENT_AMBULATORY_CARE_PROVIDER_SITE_OTHER): Payer: No Typology Code available for payment source | Admitting: Vascular Surgery

## 2012-03-24 DIAGNOSIS — I70219 Atherosclerosis of native arteries of extremities with intermittent claudication, unspecified extremity: Secondary | ICD-10-CM

## 2012-05-11 ENCOUNTER — Other Ambulatory Visit: Payer: No Typology Code available for payment source

## 2012-05-12 ENCOUNTER — Ambulatory Visit
Admission: RE | Admit: 2012-05-12 | Discharge: 2012-05-12 | Disposition: A | Discharge status: Home / Self Care | Payer: No Typology Code available for payment source | Source: Ambulatory Visit | Attending: Gastroenterology | Admitting: Gastroenterology

## 2012-05-12 DIAGNOSIS — K635 Polyp of colon: Secondary | ICD-10-CM

## 2012-12-19 ENCOUNTER — Other Ambulatory Visit: Payer: Self-pay | Admitting: Neurology

## 2012-12-25 ENCOUNTER — Other Ambulatory Visit: Payer: Self-pay | Admitting: Neurology

## 2013-01-14 ENCOUNTER — Emergency Department (HOSPITAL_COMMUNITY)
Admission: EM | Admit: 2013-01-14 | Discharge: 2013-01-14 | Disposition: A | Discharge status: Home / Self Care | Payer: No Typology Code available for payment source | Attending: Emergency Medicine | Admitting: Emergency Medicine

## 2013-01-14 ENCOUNTER — Emergency Department (HOSPITAL_COMMUNITY): Payer: No Typology Code available for payment source

## 2013-01-14 ENCOUNTER — Encounter (HOSPITAL_COMMUNITY): Payer: Self-pay | Admitting: *Deleted

## 2013-01-14 DIAGNOSIS — S82402A Unspecified fracture of shaft of left fibula, initial encounter for closed fracture: Secondary | ICD-10-CM

## 2013-01-14 DIAGNOSIS — Z79899 Other long term (current) drug therapy: Secondary | ICD-10-CM | POA: Insufficient documentation

## 2013-01-14 DIAGNOSIS — Z7982 Long term (current) use of aspirin: Secondary | ICD-10-CM | POA: Insufficient documentation

## 2013-01-14 DIAGNOSIS — Z8679 Personal history of other diseases of the circulatory system: Secondary | ICD-10-CM | POA: Insufficient documentation

## 2013-01-14 DIAGNOSIS — G609 Hereditary and idiopathic neuropathy, unspecified: Secondary | ICD-10-CM | POA: Insufficient documentation

## 2013-01-14 DIAGNOSIS — Z8709 Personal history of other diseases of the respiratory system: Secondary | ICD-10-CM | POA: Insufficient documentation

## 2013-01-14 DIAGNOSIS — Y990 Civilian activity done for income or pay: Secondary | ICD-10-CM | POA: Insufficient documentation

## 2013-01-14 DIAGNOSIS — F329 Major depressive disorder, single episode, unspecified: Secondary | ICD-10-CM | POA: Insufficient documentation

## 2013-01-14 DIAGNOSIS — S82409A Unspecified fracture of shaft of unspecified fibula, initial encounter for closed fracture: Secondary | ICD-10-CM | POA: Insufficient documentation

## 2013-01-14 DIAGNOSIS — F3289 Other specified depressive episodes: Secondary | ICD-10-CM | POA: Insufficient documentation

## 2013-01-14 DIAGNOSIS — Y929 Unspecified place or not applicable: Secondary | ICD-10-CM | POA: Insufficient documentation

## 2013-01-14 DIAGNOSIS — Z87891 Personal history of nicotine dependence: Secondary | ICD-10-CM | POA: Insufficient documentation

## 2013-01-14 DIAGNOSIS — R350 Frequency of micturition: Secondary | ICD-10-CM | POA: Insufficient documentation

## 2013-01-14 DIAGNOSIS — W010XXA Fall on same level from slipping, tripping and stumbling without subsequent striking against object, initial encounter: Secondary | ICD-10-CM | POA: Insufficient documentation

## 2013-01-14 DIAGNOSIS — M1990 Unspecified osteoarthritis, unspecified site: Secondary | ICD-10-CM | POA: Insufficient documentation

## 2013-01-14 MED ORDER — HYDROCODONE-ACETAMINOPHEN 5-325 MG PO TABS: 1.0000 | ORAL_TABLET | Freq: Four times a day (QID) | ORAL | Status: DC | PRN

## 2013-01-14 MED ORDER — HYDROCODONE-ACETAMINOPHEN 5-325 MG PO TABS
2.0000 | ORAL_TABLET | Freq: Once | ORAL | Status: AC
  Administered 2013-01-14: 2 via ORAL
  Filled 2013-01-14: qty 2

## 2013-01-14 NOTE — Progress Notes (Signed)
Orthopedic Tech Progress Note Patient Details:  Arthur Smith [birth year] [id number]  Ortho Devices Type of Ortho Device: CAM walker;Crutches Ortho Device/Splint Interventions: Application   [name], Arthur Smith 01/14/2013, 2:26 PM

## 2013-01-14 NOTE — ED Provider Notes (Signed)
History     CSN: [id number]  Arrival date & time 01/14/13  1214   First MD Initiated Contact with Patient 01/14/13 1234      Chief Complaint  Patient presents with  . Ankle Injury    (Consider location/radiation/quality/duration/timing/severity/associated sxs/prior treatment) The history is provided by the patient. No language interpreter was used.  Arthur Smith is a 62 y/o M with PMHx of neuromuscular disorder, neuropathy, depression presenting to the ED with left ankle pain after sustaining a fall down a ramp at work - patient stated that the pain is localized to the left ankle without radiation, described as a constant aching sensation that worsens with motion - sharp, shooting pain. Stated that as he was walking down the ramp he lost his footing and ended up landing on his left ankle in an extended position. Stated that he has numbness to feet, bilaterally, secondary to neuropathy - baseline for patient. Reported swelling to the outside of his ankle. Denied changes to numbness, weakness, tingling, loss of sensation, LOC, head injury.   Past Medical History  Diagnosis Date  . Recurrent upper respiratory infection (URI)     SOB CURRENT COLD  . Neuromuscular disorder     DIZZINESS   PERIPHERAL NEUROPATHY   . Arthritis   . Frequency of urination     URINATION AT NIGHT, PAINFUL SLOW STREAM   . No pertinent past medical history     RINGING RIGHT EAR  . Depression   . Shortness of breath     d/t "cold" 12/02/11  . Bronchitis with asthma, acute April 2013    Past Surgical History  Procedure Laterality Date  . Hernia repair      UMBILICAL HERNIA 2011  . Finger surgery      RIGHT MIDDLE FINGER  . Multiple tooth extractions    . Lumbar laminectomy/decompression microdiscectomy  12/22/2011    Procedure: LUMBAR LAMINECTOMY/DECOMPRESSION MICRODISCECTOMY 1 LEVEL;  Surgeon: Maeola Harman, MD;  Location: [facility];  Service: Neurosurgery;  Laterality: N/A;  Thoracic ten-eleven  laminectomy    No family history on file.  History  Substance Use Topics  . Smoking status: Former Games developer  . Smokeless tobacco: Not on file  . Alcohol Use: No      Review of Systems  Constitutional: Negative for fever and chills.  HENT: Negative for sore throat and trouble swallowing.   Eyes: Negative for visual disturbance.  Respiratory: Negative for chest tightness and shortness of breath.   Cardiovascular: Negative for chest pain.  Musculoskeletal: Positive for arthralgias (left ankle). Negative for back pain.  Skin: Negative for rash and wound.  Neurological: Positive for numbness (baseline for patient in both feet and BLE). Negative for dizziness and weakness.  All other systems reviewed and are negative.    Allergies  Review of patient's allergies indicates no known allergies.  Home Medications   Current Outpatient Rx  Name  Route  Sig  Dispense  Refill  . aspirin EC 81 MG tablet   Oral   Take 162 mg by mouth daily.          Marland Kitchen gabapentin (NEURONTIN) 100 MG capsule   Oral   Take 100 mg by mouth 4 (four) times daily.         Marland Kitchen gabapentin (NEURONTIN) 800 MG tablet   Oral   Take 800 mg by mouth 4 (four) times daily.          Marland Kitchen imipramine (TOFRANIL) 25 MG tablet   Oral  Take 150 mg by mouth at bedtime.         . senna-docusate (SENNA S) 8.6-50 MG per tablet   Oral   Take 3 tablets by mouth at bedtime.          . traMADol (ULTRAM) 50 MG tablet   Oral   Take 100 mg by mouth every 6 (six) hours.          Marland Kitchen zonisamide (ZONEGRAN) 100 MG capsule   Oral   Take 300 mg by mouth at bedtime.         Marland Kitchen HYDROcodone-acetaminophen (NORCO) 5-325 MG per tablet   Oral   Take 1 tablet by mouth every 6 (six) hours as needed for pain.   6 tablet   0     BP 126/76  Pulse 96  Temp(Src) 97.6 F (36.4 C) (Oral)  Resp 18  SpO2 94%  Physical Exam  Nursing note and vitals reviewed. Constitutional: He is oriented to person, place, and time. He appears  well-developed and well-nourished. No distress.  HENT:  Head: Normocephalic and atraumatic.  Eyes: Conjunctivae and EOM are normal. Pupils are equal, round, and reactive to light.  Neck: Normal range of motion. Neck supple. No tracheal deviation present.  Negative neck stiffness Negative nuchal rigidity Negative lymphadenopathy  Cardiovascular: Normal rate, regular rhythm and normal heart sounds.  Exam reveals no friction rub.   No murmur heard. Pulses:      Radial pulses are 2+ on the right side, and 2+ on the left side.       Dorsalis pedis pulses are 2+ on the right side, and 2+ on the left side.  Pulmonary/Chest: Effort normal and breath sounds normal. No respiratory distress. He has no wheezes. He has no rales.  Musculoskeletal: He exhibits tenderness. He exhibits no edema.  Swelling noted to the lateral aspect of the left ankle - swelling to the lateral malleolus. Negative ecchymosis, inflammation, warmth to touch noted.  Negative ecchymosis noted to foot or toes of left extremity  Pain upon palpation to the lateral malleolus and lateral aspect of left foot - ATFL tenderness upon palpation.  Decreased ROM to the left ankle secondary to pain.  Full ROM to the left hip, knee, and toes  Strength 5+/5+ to left leg, strength 4+/5+ to the left foot Strength 5+/5+ to right lower extremity   Lymphadenopathy:    He has no cervical adenopathy.  Neurological: He is alert and oriented to person, place, and time. He exhibits normal muscle tone. Coordination normal.  Skin: Skin is warm and dry. No rash noted. He is not diaphoretic. No erythema.  Psychiatric: He has a normal mood and affect. His behavior is normal. Thought content normal.    ED Course  Procedures (including critical care time)  Labs Reviewed - No data to display Dg Ankle Complete Left  01/14/2013   *RADIOLOGY REPORT*  Clinical Data: Pain post fall.  LEFT ANKLE COMPLETE - 3+ VIEW  Comparison: None.  Findings: Oblique  fracture of the distal fibula at the level of the ankle mortise, distracted approximately 1 mm.  Ankle mortise appears intact.  Medial malleolus intact.  Normal mineralization and alignment.  No significant osseous degenerative change.  IMPRESSION:  Minimally-displaced oblique fracture, distal fibula   Original Report Authenticated By: D. Andria Rhein, MD     1. Fibula fracture, left, closed, initial encounter       MDM  Patient stable, afebrile. Left ankle imaging noted minimally displaced oblique fracture of  distal fibula, approximately 1 mm - negative grossly displaced fracture. Negative vascular damage noted. Patient has numbness and decreased sensation to the lower extremities bilaterally  Discussed findings with Dr. Manus Gunning - recommended camwalker boot and crutches - cleared patient for discharge. Pain managed in ED setting. Patient stable, afebrile, aseptic. Negative findings for septic joint. Patient placed in camwalker and given cructhes. Discussed with patient care and to stay in boot at all times - use crutches at all times. Referred patient to PCP and orthopedics. Discussed with patient to rest, elevate, ice. Discussed with patient to monitor symptoms and if symptoms are to worsen or change to report back to the ED.  Patient agreed to plan of care, understood, all questions answered.        Raymon Mutton, PA-C 01/14/13 1707

## 2013-01-14 NOTE — ED Provider Notes (Signed)
Medical screening examination/treatment/procedure(s) were performed by non-physician practitioner and as supervising physician I was immediately available for consultation/collaboration.   Glynn Octave, MD 01/14/13 [phone]

## 2013-01-14 NOTE — ED Notes (Signed)
Pt is here with left ankle injury with lateral swelling after coming out of workshop and slipped down walkway and came down on left foot.

## 2013-01-16 DIAGNOSIS — S8263XA Displaced fracture of lateral malleolus of unspecified fibula, initial encounter for closed fracture: Secondary | ICD-10-CM | POA: Insufficient documentation

## 2013-01-30 DIAGNOSIS — S82892D Other fracture of left lower leg, subsequent encounter for closed fracture with routine healing: Secondary | ICD-10-CM | POA: Insufficient documentation

## 2013-03-06 DIAGNOSIS — M79672 Pain in left foot: Secondary | ICD-10-CM | POA: Insufficient documentation

## 2013-04-18 ENCOUNTER — Other Ambulatory Visit: Payer: Self-pay

## 2013-04-18 MED ORDER — TRAMADOL HCL 50 MG PO TABS: 100.0000 mg | ORAL_TABLET | Freq: Four times a day (QID) | ORAL | Status: DC | PRN

## 2013-04-18 NOTE — Telephone Encounter (Signed)
Rx signed and faxed.

## 2013-05-02 ENCOUNTER — Ambulatory Visit (INDEPENDENT_AMBULATORY_CARE_PROVIDER_SITE_OTHER): Payer: PRIVATE HEALTH INSURANCE | Admitting: Nurse Practitioner

## 2013-05-02 ENCOUNTER — Encounter: Payer: Self-pay | Admitting: Nurse Practitioner

## 2013-05-02 VITALS — BP 117/74 | HR 84 | Wt 227.5 lb

## 2013-05-02 DIAGNOSIS — G609 Hereditary and idiopathic neuropathy, unspecified: Secondary | ICD-10-CM

## 2013-05-02 DIAGNOSIS — M5414 Radiculopathy, thoracic region: Secondary | ICD-10-CM

## 2013-05-02 MED ORDER — GABAPENTIN 800 MG PO TABS: 800.0000 mg | ORAL_TABLET | Freq: Four times a day (QID) | ORAL | Status: DC

## 2013-05-02 MED ORDER — GABAPENTIN 100 MG PO CAPS: 100.0000 mg | ORAL_CAPSULE | Freq: Four times a day (QID) | ORAL | Status: DC

## 2013-05-02 NOTE — Patient Instructions (Addendum)
Try Lyrica 50 mg twice daily along with current dose of gabapentin Will renew her gabapentin Call in 2 weeks and with me know how Lyrica is working Followup in 6 months

## 2013-05-02 NOTE — Progress Notes (Addendum)
[facility]  PATIENT: Arthur Smith DOB: Oct 18, 1950   REASON FOR VISIT: Followup for neuropathy   HISTORY OF PRESENT ILLNESS:  Arthur Smith is a 62 year old right-handed white male with a history of chronic low back pain. The pt was last seen 10/28/12. The patient also has an inherited peripheral neuropathy and has been followed through this office for this reason. The patient presents with a sudden onset of right-sided back pain with radiation to the umbilical area on the right. The patient has severe pain that has persisted. The patient may note that bending and twisting may worsen the pain and the patient never had a rash in this distribution. The wife is a Engineer, civil (consulting), and has looked for a shingles outbreak, but none was seen. The patient has not had a scanning procedure of the low back or thoracic spine in the recent past. The patient has undergone a renal ultrasound and has undergone CT scanning procedures of the abdomen, with no evidence of renal calculi. The patient notes that the pain has a burning quality to it. The patient believes that he has had some issues with hesitancy and urgency the bladder and this has increased since the onset of the pain. The patient comes to this office for an evaluation. The patient is already on gabapentin and imipramine and Zonegran for discomfort prior to the onset of the above symptoms.  04/08/12. Pt had decompressive laminectomy T10-T11 for spinal stenosis by Dr. Venetia Maxon 12/22/11. He says his pain was resolved post operatively He is currently in PT. He remains on his Gabapentin for inherited neuropathy. He has no neurologic complaints.   05/02/13: Patient returns for followup. He had a fall May 29 with fracture of the left tibia distal and. He has had more problems controlling his pain since that time particularly on the left foot and left lower leg.Marland Kitchen He is on the maximum dose of Neurontin presently . He also takes Ultram He has an inherited  neuropathy.   REVIEW OF SYSTEMS: Full 14 system review of systems performed and notable only for:  Constitutional: N/A  Cardiovascular: N/A  Ear/Nose/Throat: Hearing loss  Skin: N/A  Eyes: N/A  Respiratory: Snoring Gastroitestinal: N/A  Hematology/Lymphatic: N/A  Endocrine: N/A Musculoskeletal:N/A  Allergy/Immunology: N/A  Neurological: Neuropathy pain  Psychiatric: N/A   ALLERGIES: No Known Allergies  HOME MEDICATIONS: Outpatient Prescriptions Prior to Visit  Medication Sig Dispense Refill  . aspirin EC 81 MG tablet Take 162 mg by mouth daily.       Marland Kitchen gabapentin (NEURONTIN) 100 MG capsule Take 100 mg by mouth 4 (four) times daily.      Marland Kitchen gabapentin (NEURONTIN) 800 MG tablet Take 800 mg by mouth 4 (four) times daily.       Marland Kitchen HYDROcodone-acetaminophen (NORCO) 5-325 MG per tablet Take 1 tablet by mouth every 6 (six) hours as needed for pain.  6 tablet  0  . imipramine (TOFRANIL) 25 MG tablet Take 150 mg by mouth at bedtime.      . senna-docusate (SENNA S) 8.6-50 MG per tablet Take 3 tablets by mouth at bedtime.       . traMADol (ULTRAM) 50 MG tablet Take 2 tablets (100 mg total) by mouth 4 (four) times daily as needed.  240 tablet  0  . zonisamide (ZONEGRAN) 100 MG capsule Take 300 mg by mouth at bedtime.       No facility-administered medications prior to visit.    PAST MEDICAL HISTORY: Past Medical History  Diagnosis  Date  . Recurrent upper respiratory infection (URI)     SOB CURRENT COLD  . Neuromuscular disorder     DIZZINESS   PERIPHERAL NEUROPATHY   . Arthritis   . Frequency of urination     URINATION AT NIGHT, PAINFUL SLOW STREAM   . No pertinent past medical history     RINGING RIGHT EAR  . Depression   . Shortness of breath     d/t "cold" 12/02/11  . Bronchitis with asthma, acute April 2013    PAST SURGICAL HISTORY: Past Surgical History  Procedure Laterality Date  . Hernia repair      UMBILICAL HERNIA 2011  . Finger surgery      RIGHT MIDDLE FINGER    . Multiple tooth extractions    . Lumbar laminectomy/decompression microdiscectomy  12/22/2011    Procedure: LUMBAR LAMINECTOMY/DECOMPRESSION MICRODISCECTOMY 1 LEVEL;  Surgeon: Maeola Harman, MD;  Location: [facility];  Service: Neurosurgery;  Laterality: N/A;  Thoracic ten-eleven laminectomy    FAMILY HISTORY: History reviewed. No pertinent family history.  SOCIAL HISTORY: History   Social History  . Marital Status: Married    Spouse Name: N/A    Number of Children: N/A  . Years of Education: N/A   Occupational History  . Not on file.   Social History Main Topics  . Smoking status: Former Games developer  . Smokeless tobacco: Never Used  . Alcohol Use: No  . Drug Use: No  . Sexual Activity: Not on file   Other Topics Concern  . Not on file   Social History Narrative  . No narrative on file     PHYSICAL EXAM  Filed Vitals:   05/02/13 1101  BP: 117/74  Pulse: 84  Weight: 227 lb 8 oz (103.193 kg)   Body mass index is 34.6 kg/(m^2).  Generalized: Well developed, moderately obese male in no acute distress  Head: normocephalic and atraumatic,. Oropharynx benign  Neck: Supple, no carotid bruits  Cardiac: Regular rate rhythm, no murmur  Musculoskeletal: No deformity   Neurological examination   Mentation: Alert oriented to time, place, history taking. Follows all commands speech and language fluent  Cranial nerve II-XII: Pupils were equal round reactive to light extraocular movements were full, visual field were full on confrontational test. Facial sensation and strength were normal. hearing was intact to finger rubbing bilaterally. Uvula tongue midline. head turning and shoulder shrug and were normal and symmetric.Tongue protrusion into cheek strength was normal. Motor: normal bulk and tone, full strength in the BUE, BLE, fine finger movements normal, no pronator drift. No focal weakness Sensory: Decreased pinprick up to the knees bilaterally, vibratory sense is impaired in  the feet,  position sense is intact   Coordination: finger-nose-finger, heel-to-shin bilaterally, no dysmetria Reflexes: Brachioradialis 2/2, biceps 2/2, triceps 2/2, patellar 2/2, Achilles trace, plantar responses were flexor bilaterally. Gait and Station: Rising up from seated position without assistance, wide based  stance,  moderate stride, good arm swing, smooth turning, able to perform tiptoe, and heel walking without difficulty. Tandem gait steady   DIAGNOSTIC DATA (LABS, IMAGING, TESTING) - None to review   ASSESSMENT AND PLAN  62 y.o. year old male  has a past medical history of inherited peripheral neuropathy . He had recent fracture of the left tibia distal hand and is having more difficulty controlling his pain. He is currently on gabapentin 900 four  times daily  Try Lyrica 50 mg twice daily along with current dose of gabapentin, samples given Will renew  gabapentin Call in 2 weeks and with me know how Lyrica is working Followup in 6 months Nilda Riggs, [facility], [facility], APRN  [facility] [address], [address] [place], [place] [address] [phone]  I reviewed the above note and documentation by the Nurse Practitioner and agree with the history, physical exam, assessment and plan as outlined above.

## 2013-05-08 ENCOUNTER — Telehealth: Payer: Self-pay | Admitting: Nurse Practitioner

## 2013-05-09 MED ORDER — PREGABALIN 100 MG PO CAPS: 100.0000 mg | ORAL_CAPSULE | Freq: Two times a day (BID) | ORAL | Status: DC

## 2013-05-09 NOTE — Telephone Encounter (Signed)
Called pt, he feels Lyrica has been helpful will increase dose slightly.

## 2013-05-09 NOTE — Telephone Encounter (Signed)
Return pt's call about the medication pregabalin (Lyrica) 50 mg, pt's states that his pain level is about 30- 50 %. Pt states that he would like to increase this medication to 100 mg. Pt also wanted to know if so could Darrol Angel, NP call in a prescription.

## 2013-05-25 ENCOUNTER — Other Ambulatory Visit: Payer: Self-pay | Admitting: Neurology

## 2013-05-26 ENCOUNTER — Other Ambulatory Visit: Payer: Self-pay

## 2013-05-26 MED ORDER — TRAMADOL HCL 50 MG PO TABS: 100.0000 mg | ORAL_TABLET | Freq: Four times a day (QID) | ORAL | Status: DC | PRN

## 2013-06-20 ENCOUNTER — Other Ambulatory Visit: Payer: Self-pay | Admitting: Neurology

## 2013-06-20 MED ORDER — IMIPRAMINE HCL 25 MG PO TABS: 150.0000 mg | ORAL_TABLET | Freq: Every day | ORAL | Status: DC

## 2013-06-20 NOTE — Telephone Encounter (Signed)
Pt's prescription was faxed over to [facility] at [phone].

## 2013-06-27 ENCOUNTER — Other Ambulatory Visit: Payer: Self-pay | Admitting: Neurology

## 2013-06-27 MED ORDER — TRAMADOL HCL 50 MG PO TABS: 100.0000 mg | ORAL_TABLET | Freq: Four times a day (QID) | ORAL | Status: DC | PRN

## 2013-06-28 ENCOUNTER — Telehealth: Payer: Self-pay | Admitting: *Deleted

## 2013-06-28 NOTE — Telephone Encounter (Signed)
Pt's prescription was faxed to [facility] : [facility] at [phone].

## 2013-07-03 NOTE — Telephone Encounter (Signed)
I have not had access to Epic for over 2 weeks.  By viewing the chart, this Rx was already processed.

## 2013-07-18 ENCOUNTER — Other Ambulatory Visit: Payer: Self-pay | Admitting: Neurology

## 2013-07-28 ENCOUNTER — Other Ambulatory Visit: Payer: Self-pay | Admitting: Neurology

## 2013-07-28 NOTE — Telephone Encounter (Signed)
Rx faxed to [facility] at [phone].

## 2013-09-27 ENCOUNTER — Other Ambulatory Visit: Payer: Self-pay | Admitting: Neurology

## 2013-09-27 NOTE — Telephone Encounter (Signed)
Rx signed and faxed.

## 2013-10-15 ENCOUNTER — Other Ambulatory Visit: Payer: Self-pay

## 2013-10-15 MED ORDER — IMIPRAMINE HCL 25 MG PO TABS: 150.0000 mg | ORAL_TABLET | Freq: Every day | ORAL | Status: DC

## 2013-10-25 ENCOUNTER — Other Ambulatory Visit: Payer: Self-pay

## 2013-10-25 MED ORDER — GABAPENTIN 800 MG PO TABS: 800.0000 mg | ORAL_TABLET | Freq: Four times a day (QID) | ORAL | Status: DC

## 2013-10-25 MED ORDER — PREGABALIN 100 MG PO CAPS: 100.0000 mg | ORAL_CAPSULE | Freq: Two times a day (BID) | ORAL | Status: DC

## 2013-10-25 MED ORDER — ZONISAMIDE 100 MG PO CAPS: 300.0000 mg | ORAL_CAPSULE | Freq: Every day | ORAL | Status: DC

## 2013-10-25 MED ORDER — IMIPRAMINE HCL 25 MG PO TABS: 150.0000 mg | ORAL_TABLET | Freq: Every day | ORAL | Status: DC

## 2013-10-25 MED ORDER — GABAPENTIN 100 MG PO CAPS: 100.0000 mg | ORAL_CAPSULE | Freq: Four times a day (QID) | ORAL | Status: DC

## 2013-10-25 NOTE — Telephone Encounter (Signed)
Rx signed and faxed.

## 2013-10-30 ENCOUNTER — Ambulatory Visit (INDEPENDENT_AMBULATORY_CARE_PROVIDER_SITE_OTHER): Payer: PRIVATE HEALTH INSURANCE | Admitting: Nurse Practitioner

## 2013-10-30 ENCOUNTER — Encounter (INDEPENDENT_AMBULATORY_CARE_PROVIDER_SITE_OTHER): Payer: Self-pay

## 2013-10-30 ENCOUNTER — Encounter: Payer: Self-pay | Admitting: Nurse Practitioner

## 2013-10-30 VITALS — BP 115/74 | HR 85 | Ht 69.0 in | Wt 225.0 lb

## 2013-10-30 DIAGNOSIS — G609 Hereditary and idiopathic neuropathy, unspecified: Secondary | ICD-10-CM

## 2013-10-30 DIAGNOSIS — M5414 Radiculopathy, thoracic region: Secondary | ICD-10-CM

## 2013-10-30 NOTE — Progress Notes (Signed)
[facility]  PATIENT: Arthur FerronRoger A Smith DOB: 03/04/1951   REASON FOR VISIT: Followup for peripheral neuropathy   HISTORY OF PRESENT ILLNESS:[name], 63 year old male returns for followup. He has a history of inherited peripheral neuropathy and chronic low back pain. Lyrica was added to his regimen at his last visit and his symptoms are well controlled. He is also on gabapentin and Ultram. He returns for reevaluation    HISTORY: of chronic low back pain. The pt was last seen 10/28/12. The patient also has an inherited peripheral neuropathy and has been followed through this office for this reason. The patient presents with a sudden onset of right-sided back pain with radiation to the umbilical area on the right. The patient has severe pain that has persisted. The patient may note that bending and twisting may worsen the pain and the patient never had a rash in this distribution. The wife is a Engineer, civil (consulting)nurse, and has looked for a shingles outbreak, but none was seen. The patient has not had a scanning procedure of the low back or thoracic spine in the recent past. The patient has undergone a renal ultrasound and has undergone CT scanning procedures of the abdomen, with no evidence of renal calculi. The patient notes that the pain has a burning quality to it. The patient believes that he has had some issues with hesitancy and urgency the bladder and this has increased since the onset of the pain. The patient comes to this office for an evaluation. The patient is already on gabapentin and imipramine and Zonegran for discomfort prior to the onset of the above symptoms.  04/08/12. Pt had decompressive laminectomy T10-T11 for spinal stenosis by Dr. Venetia MaxonStern 12/22/11. He says his pain was resolved post operatively He is currently in PT. He remains on his Gabapentin for inherited neuropathy. He has no neurologic complaints.  05/02/13: Patient returns for followup. He had a fall May 29 with fracture of  the left tibia distal and. He has had more problems controlling his pain since that time particularly on the left foot and left lower leg.Marland Kitchen. He is on the maximum dose of Neurontin presently . He also takes Ultram He has an inherited neuropathy.    REVIEW OF SYSTEMS: Full 14 system review of systems performed and notable only for those listed, all others are neg:  Constitutional: N/A  Cardiovascular: N/A  Ear/Nose/Throat: ringing in the ears Skin: N/A  Eyes: N/A  Respiratory: N/A  Gastroitestinal: N/A  Hematology/Lymphatic: N/A  Endocrine: N/A Musculoskeletal: Back pain Allergy/Immunology: N/A  Neurological: Inherited neuropathy Psychiatric: N/A   ALLERGIES: No Known Allergies  HOME MEDICATIONS: Outpatient Prescriptions Prior to Visit  Medication Sig Dispense Refill  . aspirin EC 81 MG tablet Take 162 mg by mouth daily.       Marland Kitchen. gabapentin (NEURONTIN) 100 MG capsule Take 1 capsule (100 mg total) by mouth 4 (four) times daily. Take with 800mg  tabs  120 capsule  5  . gabapentin (NEURONTIN) 800 MG tablet Take 1 tablet (800 mg total) by mouth 4 (four) times daily. Take with 100mg  caps  120 tablet  5  . HYDROcodone-acetaminophen (NORCO) 5-325 MG per tablet Take 1 tablet by mouth every 6 (six) hours as needed for pain.  6 tablet  0  . imipramine (TOFRANIL) 25 MG tablet Take 6 tablets (150 mg total) by mouth at bedtime.  180 tablet  5  . pregabalin (LYRICA) 100 MG capsule Take 1 capsule (100 mg total) by mouth 2 (two) times  daily.  60 capsule  5  . senna-docusate (SENNA S) 8.6-50 MG per tablet Take 3 tablets by mouth at bedtime.       . traMADol (ULTRAM) 50 MG tablet TAKE TWO TABLETS BY MOUTH 4 TIMES DAILY AS NEEDED  240 tablet  1  . zonisamide (ZONEGRAN) 100 MG capsule Take 3 capsules (300 mg total) by mouth at bedtime.  90 capsule  5   No facility-administered medications prior to visit.    PAST MEDICAL HISTORY: Past Medical History  Diagnosis Date  . Recurrent upper respiratory  infection (URI)     SOB CURRENT COLD  . Neuromuscular disorder     DIZZINESS   PERIPHERAL NEUROPATHY   . Arthritis   . Frequency of urination     URINATION AT NIGHT, PAINFUL SLOW STREAM   . No pertinent past medical history     RINGING RIGHT EAR  . Depression   . Shortness of breath     d/t "cold" 12/02/11  . Bronchitis with asthma, acute April 2013    PAST SURGICAL HISTORY: Past Surgical History  Procedure Laterality Date  . Hernia repair      UMBILICAL HERNIA 2011  . Finger surgery      RIGHT MIDDLE FINGER  . Multiple tooth extractions    . Lumbar laminectomy/decompression microdiscectomy  12/22/2011    Procedure: LUMBAR LAMINECTOMY/DECOMPRESSION MICRODISCECTOMY 1 LEVEL;  Surgeon: Maeola Harman, MD;  Location: [facility];  Service: Neurosurgery;  Laterality: N/A;  Thoracic ten-eleven laminectomy    FAMILY HISTORY: History reviewed. No pertinent family history.  SOCIAL HISTORY: History   Social History  . Marital Status: Married    Spouse Name: Merge    Number of Children: 3  . Years of Education: 12   Occupational History  . Retired   .     Social History Main Topics  . Smoking status: Former Games developer  . Smokeless tobacco: Never Used  . Alcohol Use: No  . Drug Use: No  . Sexual Activity: Not on file   Other Topics Concern  . Not on file   Social History Narrative   Patient lives at home Merge his wife.    Patient has 3 adult children and 1 step son.    Patient has a high school education.    Patient is retired.    [employer]      PHYSICAL EXAM  Filed Vitals:   10/30/13 1517  BP: 115/74  Pulse: 85  Height: 5\' 9"  (1.753 m)  Weight: 225 lb (102.059 kg)   Body mass index is 33.21 kg/(m^2).  Generalized: Well developed, moderately obese male in no acute distress    Neurological examination   Mentation: Alert oriented to time, place, history taking. Follows all commands speech and language fluent  Cranial nerve II-XII: Fundoscopic exam reveals  sharp disc margins.Pupils were equal round reactive to light extraocular movements were full, visual field were full on confrontational test. Facial sensation and strength were normal. hearing was intact to finger rubbing bilaterally. Uvula tongue midline. head turning and shoulder shrug were normal and symmetric.Tongue protrusion into cheek strength was normal. Motor: normal bulk and tone, full strength in the BUE, BLE, . No focal weakness Sensory: Decreased pinprick to the knees bilaterally, vibratory impaired in the feet, position sense is intact   Coordination: finger-nose-finger, heel-to-shin bilaterally, no dysmetria Reflexes: Brachioradialis 2/2, biceps 2/2, triceps 2/2, patellar 2/2, Achilles trace,  plantar responses were flexor bilaterally. Gait and Station: Rising up from seated position  without assistance, wide based stance,  moderate stride, good arm swing, smooth turning, able to perform tiptoe, and heel walking without difficulty. Tandem gait is steady  DIAGNOSTIC DATA (LABS, IMAGING, TESTING) -  ASSESSMENT AND PLAN  63 y.o. year old male  has a past medical history of peripheral neuropathy and chronic back pain.  Continue Lyrica at current dose refilled 10/25/13 Continue gabapentin at current dose, refilled 10/25/13 Followup in 6 months Nilda Riggs, [facility], [facility], [facility]  [facility] [address] [place], [place] [address] [phone]

## 2013-10-30 NOTE — Patient Instructions (Addendum)
Continue Lyrica at current dose Continue gabapentin at current dose Followup in 6 months

## 2013-10-30 NOTE — Progress Notes (Signed)
I have read the note, and I agree with the clinical assessment and plan.  WILLIS,CHARLES KEITH   

## 2013-11-27 ENCOUNTER — Other Ambulatory Visit: Payer: Self-pay | Admitting: Neurology

## 2013-11-28 NOTE — Telephone Encounter (Signed)
Rx signed and faxed.

## 2013-12-28 ENCOUNTER — Other Ambulatory Visit: Payer: Self-pay | Admitting: Neurology

## 2013-12-28 ENCOUNTER — Telehealth: Payer: Self-pay | Admitting: Neurology

## 2013-12-28 MED ORDER — TAMSULOSIN HCL 0.4 MG PO CAPS: 0.4000 mg | ORAL_CAPSULE | Freq: Every day | ORAL | Status: DC

## 2013-12-28 NOTE — Telephone Encounter (Signed)
Patient is requesting a Rx for Flomax.  Would you like to prescribe?  Please advise.  Thank you.

## 2013-12-28 NOTE — Telephone Encounter (Signed)
The patient apparently had the pharmacy call our office to get the Flomax refill, I called the patient and left a message, we are not prescribing this medication, he needs to contact the doctor that gave him this prescription, we did not have this on the list of his medications.

## 2014-02-01 ENCOUNTER — Other Ambulatory Visit (HOSPITAL_COMMUNITY): Payer: Self-pay | Admitting: Neurosurgery

## 2014-02-01 DIAGNOSIS — IMO0002 Reserved for concepts with insufficient information to code with codable children: Secondary | ICD-10-CM

## 2014-02-05 ENCOUNTER — Other Ambulatory Visit: Payer: Self-pay | Admitting: Neurology

## 2014-02-05 NOTE — Telephone Encounter (Signed)
Rx signed and faxed.

## 2014-02-20 ENCOUNTER — Ambulatory Visit (HOSPITAL_COMMUNITY)
Admission: RE | Admit: 2014-02-20 | Discharge: 2014-02-20 | Disposition: A | Discharge status: Home / Self Care | Payer: No Typology Code available for payment source | Source: Ambulatory Visit | Attending: Neurosurgery | Admitting: Neurosurgery

## 2014-02-20 DIAGNOSIS — M51379 Other intervertebral disc degeneration, lumbosacral region without mention of lumbar back pain or lower extremity pain: Secondary | ICD-10-CM | POA: Insufficient documentation

## 2014-02-20 DIAGNOSIS — IMO0002 Reserved for concepts with insufficient information to code with codable children: Secondary | ICD-10-CM

## 2014-02-20 DIAGNOSIS — M5137 Other intervertebral disc degeneration, lumbosacral region: Secondary | ICD-10-CM | POA: Insufficient documentation

## 2014-02-20 LAB — CREATININE, SERUM
Creatinine, Ser: 1.12 mg/dL (ref 0.50–1.35)
GFR calc Af Amer: 80 mL/min — ABNORMAL LOW (ref >90)
GFR calc non Af Amer: 69 mL/min — ABNORMAL LOW (ref >90)

## 2014-02-20 MED ORDER — GADOBENATE DIMEGLUMINE 529 MG/ML IV SOLN
20.0000 mL | Freq: Once | INTRAVENOUS | Status: AC | PRN
  Administered 2014-02-20: 20 mL via INTRAVENOUS

## 2014-03-08 ENCOUNTER — Encounter: Payer: Self-pay | Admitting: Dietician

## 2014-03-08 ENCOUNTER — Encounter: Payer: No Typology Code available for payment source | Attending: Internal Medicine | Admitting: Dietician

## 2014-03-08 VITALS — Ht 69.0 in | Wt 219.0 lb

## 2014-03-08 DIAGNOSIS — Z6832 Body mass index (BMI) 32.0-32.9, adult: Secondary | ICD-10-CM | POA: Diagnosis not present

## 2014-03-08 DIAGNOSIS — Z713 Dietary counseling and surveillance: Secondary | ICD-10-CM | POA: Insufficient documentation

## 2014-03-08 DIAGNOSIS — E669 Obesity, unspecified: Secondary | ICD-10-CM | POA: Diagnosis not present

## 2014-03-08 DIAGNOSIS — E785 Hyperlipidemia, unspecified: Secondary | ICD-10-CM | POA: Diagnosis not present

## 2014-03-08 NOTE — Progress Notes (Signed)
  Medical Nutrition Therapy:  Appt start time: 1430 end time:  1515.   Assessment:  Primary concerns today:  Arthur Smith is here today reporting that Dr. Valentina LucksGriffin sent him here for a "special diet" due to high cholesterol. He states his whole family has history of high cholesterol. Mom had diabetes. Arthur Smith lives with his wife. She does the grocery shopping and cooking. Since receiving news that he has high cholesterol, Arthur Smith reports that he has been watching what he eats and has lost 10 pounds in the last few months. He states that he does not salt his food and uses salsa, BBQ sauce, pepper, light [place] dressing and hot sauce. Always uses light salad dressings.   Preferred Learning Style:  No preference indicated   Learning Readiness:   Ready   MEDICATIONS: see list   DIETARY INTAKE:  24-hr recall:  B ( AM): dry "diet" cereal OR egg whites with low sodium ham; water   Snk ( AM): fruits (peaches, plums, grapes) L ( PM): sometimes skips Snk ( PM): cereal D ( PM): ribeye steak, pork chops, lean hamburger with vegetables Snk ( PM): sometimes; fruit  Beverages: water, 3-4 cups black coffee per day, orange juice, vanilla soy milk   Usual physical activity: considering joining the [facility] [employer]; neuropathy and current back injury  Estimated energy needs: 1800-2000 calories  Progress Towards Goal(s):  In progress.   Nutritional Diagnosis:  Kite-2.2 Altered nutrition-related laboratory (dyslipidemia) As related to obesity and diet high in saturated fat and family history of hyperlipidemia.  As evidenced by TGs 302, TC 250, HDL 35, and LDL 215.    Intervention:  Nutrition counseling provided. Encouraged decreased intake of saturated fats. Explained role of fiber in decreasing cholesterol. Recommended exercise and intake of healthy unsaturated fats.  Teaching Method Utilized: Visual Auditory  Handouts given during visit include:  High fiber foods  Cholesterol and Triglycerides  handout  Barriers to learning/adherence to lifestyle change: neuropathy and back injury  Demonstrated degree of understanding via:  Teach Back   Monitoring/Evaluation:  Dietary intake, exercise, labs, and body weight in 3 month(s).

## 2014-03-08 NOTE — Patient Instructions (Addendum)
-  Encourage your wife to join [employer] with you  -Swimming!  -Decrease saturated fat intake  -Lean cuts of beef and pork, lean ground beef, fish, chicken or [place] without skin   -Increase intake of healthy fats  -Fish, nuts (almonds and walnuts), avocado, olive oil  -Increase fiber intake    -Non-starchy vegetables, fruits, beans, whole grains, nuts

## 2014-03-12 ENCOUNTER — Other Ambulatory Visit: Payer: Self-pay | Admitting: Neurosurgery

## 2014-04-15 ENCOUNTER — Emergency Department (HOSPITAL_COMMUNITY): Payer: No Typology Code available for payment source

## 2014-04-15 ENCOUNTER — Emergency Department (HOSPITAL_COMMUNITY)
Admission: EM | Admit: 2014-04-15 | Discharge: 2014-04-15 | Disposition: A | Discharge status: Home / Self Care | Payer: No Typology Code available for payment source | Attending: Emergency Medicine | Admitting: Emergency Medicine

## 2014-04-15 ENCOUNTER — Encounter (HOSPITAL_COMMUNITY): Payer: Self-pay | Admitting: Emergency Medicine

## 2014-04-15 DIAGNOSIS — J45909 Unspecified asthma, uncomplicated: Secondary | ICD-10-CM | POA: Insufficient documentation

## 2014-04-15 DIAGNOSIS — R5381 Other malaise: Secondary | ICD-10-CM | POA: Insufficient documentation

## 2014-04-15 DIAGNOSIS — Z87891 Personal history of nicotine dependence: Secondary | ICD-10-CM | POA: Diagnosis not present

## 2014-04-15 DIAGNOSIS — F29 Unspecified psychosis not due to a substance or known physiological condition: Secondary | ICD-10-CM | POA: Insufficient documentation

## 2014-04-15 DIAGNOSIS — F3289 Other specified depressive episodes: Secondary | ICD-10-CM | POA: Diagnosis not present

## 2014-04-15 DIAGNOSIS — M129 Arthropathy, unspecified: Secondary | ICD-10-CM | POA: Insufficient documentation

## 2014-04-15 DIAGNOSIS — R4182 Altered mental status, unspecified: Secondary | ICD-10-CM | POA: Diagnosis present

## 2014-04-15 DIAGNOSIS — F329 Major depressive disorder, single episode, unspecified: Secondary | ICD-10-CM | POA: Diagnosis not present

## 2014-04-15 DIAGNOSIS — Z862 Personal history of diseases of the blood and blood-forming organs and certain disorders involving the immune mechanism: Secondary | ICD-10-CM | POA: Insufficient documentation

## 2014-04-15 DIAGNOSIS — Z79899 Other long term (current) drug therapy: Secondary | ICD-10-CM | POA: Insufficient documentation

## 2014-04-15 DIAGNOSIS — Z8669 Personal history of other diseases of the nervous system and sense organs: Secondary | ICD-10-CM | POA: Insufficient documentation

## 2014-04-15 DIAGNOSIS — Z7982 Long term (current) use of aspirin: Secondary | ICD-10-CM | POA: Insufficient documentation

## 2014-04-15 DIAGNOSIS — R5383 Other fatigue: Secondary | ICD-10-CM

## 2014-04-15 DIAGNOSIS — R410 Disorientation, unspecified: Secondary | ICD-10-CM

## 2014-04-15 DIAGNOSIS — Z8639 Personal history of other endocrine, nutritional and metabolic disease: Secondary | ICD-10-CM | POA: Insufficient documentation

## 2014-04-15 LAB — URINALYSIS, ROUTINE W REFLEX MICROSCOPIC
Bilirubin Urine: NEGATIVE
Glucose, UA: NEGATIVE mg/dL
Hgb urine dipstick: NEGATIVE
Ketones, ur: NEGATIVE mg/dL
Leukocytes, UA: NEGATIVE
Nitrite: NEGATIVE
Protein, ur: NEGATIVE mg/dL
Specific Gravity, Urine: 1.015 (ref 1.005–1.030)
Urobilinogen, UA: 1 mg/dL (ref 0.0–1.0)
pH: 6.5 (ref 5.0–8.0)

## 2014-04-15 LAB — COMPREHENSIVE METABOLIC PANEL
ALT: 32 U/L (ref 0–53)
AST: 25 U/L (ref 0–37)
Albumin: 3.6 g/dL (ref 3.5–5.2)
Alkaline Phosphatase: 151 U/L — ABNORMAL HIGH (ref 39–117)
Anion gap: 10 (ref 5–15)
BUN: 19 mg/dL (ref 6–23)
CO2: 25 mEq/L (ref 19–32)
Calcium: 9 mg/dL (ref 8.4–10.5)
Chloride: 108 mEq/L (ref 96–112)
Creatinine, Ser: 1.06 mg/dL (ref 0.50–1.35)
GFR calc Af Amer: 85 mL/min — ABNORMAL LOW (ref >90)
GFR calc non Af Amer: 73 mL/min — ABNORMAL LOW (ref >90)
Glucose, Bld: 82 mg/dL (ref 70–99)
Potassium: 4.8 mEq/L (ref 3.7–5.3)
Sodium: 143 mEq/L (ref 137–147)
Total Bilirubin: 0.4 mg/dL (ref 0.3–1.2)
Total Protein: 6.8 g/dL (ref 6.0–8.3)

## 2014-04-15 LAB — CBC WITH DIFFERENTIAL/PLATELET
Basophils Absolute: 0 10*3/uL (ref 0.0–0.1)
Basophils Relative: 0 % (ref 0–1)
Eosinophils Absolute: 0.2 10*3/uL (ref 0.0–0.7)
Eosinophils Relative: 3 % (ref 0–5)
HCT: 39.1 % (ref 39.0–52.0)
Hemoglobin: 13.2 g/dL (ref 13.0–17.0)
Lymphocytes Relative: 14 % (ref 12–46)
Lymphs Abs: 1 10*3/uL (ref 0.7–4.0)
MCH: 30.4 pg (ref 26.0–34.0)
MCHC: 33.8 g/dL (ref 30.0–36.0)
MCV: 90.1 fL (ref 78.0–100.0)
Monocytes Absolute: 0.9 10*3/uL (ref 0.1–1.0)
Monocytes Relative: 12 % (ref 3–12)
Neutro Abs: 5.4 10*3/uL (ref 1.7–7.7)
Neutrophils Relative %: 71 % (ref 43–77)
Platelets: 220 10*3/uL (ref 150–400)
RBC: 4.34 MIL/uL (ref 4.22–5.81)
RDW: 12.9 % (ref 11.5–15.5)
WBC: 7.6 10*3/uL (ref 4.0–10.5)

## 2014-04-15 LAB — CBG MONITORING, ED: Glucose-Capillary: 95 mg/dL (ref 70–99)

## 2014-04-15 LAB — RAPID URINE DRUG SCREEN, HOSP PERFORMED
Amphetamines: NOT DETECTED
Barbiturates: NOT DETECTED
Benzodiazepines: NOT DETECTED
Cocaine: NOT DETECTED
Opiates: NOT DETECTED
Tetrahydrocannabinol: NOT DETECTED

## 2014-04-15 LAB — TROPONIN I: Troponin I: <0.3 ng/mL (ref <0.30)

## 2014-04-15 MED ORDER — SODIUM CHLORIDE 0.9 % IV BOLUS (SEPSIS)
1000.0000 mL | Freq: Once | INTRAVENOUS | Status: AC
  Administered 2014-04-15: 1000 mL via INTRAVENOUS

## 2014-04-15 NOTE — Discharge Instructions (Signed)
Drink plenty of fluids. If symptoms worsen return to the ER as soon as able. Otherwise follow up with primary care doctor.    Confusion Confusion is the inability to think with your usual speed or clarity. Confusion may come on quickly or slowly over time. How quickly the confusion comes on depends on the cause. Confusion can be due to any number of causes. CAUSES   Concussion, head injury, or head trauma.  Seizures.  Stroke.  Fever.  Brain tumor.  Age related decreased brain function (dementia).  Heightened emotional states like rage or terror.  Mental illness in which the person loses the ability to determine what is real and what is not (hallucinations).  Infections such as a urinary tract infection (UTI).  Toxic effects from alcohol, drugs, or prescription medicines.  Dehydration and an imbalance of salts in the body (electrolytes).  Lack of sleep.  Low blood sugar (diabetes).  Low levels of oxygen from conditions such as chronic lung disorders.  Drug interactions or other medicine side effects.  Nutritional deficiencies, especially niacin, thiamine, vitamin C, or vitamin B.  Sudden drop in body temperature (hypothermia).  Change in routine, such as when traveling or hospitalized. SIGNS AND SYMPTOMS  People often describe their thinking as cloudy or unclear when they are confused. Confusion can also include feeling disoriented. That means you are unaware of where or who you are. You may also not know what the date or time is. If confused, you may also have difficulty paying attention, remembering, and making decisions. Some people also act aggressively when they are confused.  DIAGNOSIS  The medical evaluation of confusion may include:  Blood and urine tests.  X-rays.  Brain and nervous system tests.  Analyzing your brain waves (electroencephalogram or EEG).  Magnetic resonance imaging (MRI) of your head.  Computed tomography (CT) scan of your  head.  Mental status tests in which your health care provider may ask many questions. Some of these questions may seem silly or strange, but they are a very important test to help diagnose and treat confusion. TREATMENT  An admission to the hospital may not be needed, but a person with confusion should not be left alone. Stay with a family member or friend until the confusion clears. Avoid alcohol, pain relievers, or sedative drugs until you have fully recovered. Do not drive until directed by your health care provider. HOME CARE INSTRUCTIONS  What family and friends can do:  To find out if someone is confused, ask the person to state his or her name, age, and the date. If the person is unsure or answers incorrectly, he or she is confused.  Always introduce yourself, no matter how well the person knows you.  Often remind the person of his or her location.  Place a calendar and clock near the confused person.  Help the person with his or her medicines. You may want to use a pill box, an alarm as a reminder, or give the person each dose as prescribed.  Talk about current events and plans for the day.  Try to keep the environment calm, quiet, and peaceful.  Make sure the person keeps follow-up visits with his or her health care provider. PREVENTION  Ways to prevent confusion:  Avoid alcohol.  Eat a balanced diet.  Get enough sleep.  Take medicine only as directed by your health care provider.  Do not become isolated. Spend time with other people and make plans for your days.  Keep careful watch  on your blood sugar levels if you are diabetic. SEEK IMMEDIATE MEDICAL CARE IF:   You develop severe headaches, repeated vomiting, seizures, blackouts, or slurred speech.  There is increasing confusion, weakness, numbness, restlessness, or personality changes.  You develop a loss of balance, have marked dizziness, feel uncoordinated, or fall.  You have delusions, hallucinations, or  develop severe anxiety.  Your family members think you need to be rechecked. Document Released: 09/10/2004 Document Revised: 12/18/2013 Document Reviewed: 09/08/2013 [facility] Patient Information 2015 [place], [place]. This information is not intended to replace advice given to you by your health care provider. Make sure you discuss any questions you have with your health care provider.

## 2014-04-15 NOTE — ED Provider Notes (Signed)
Medical screening examination/treatment/procedure(s) were conducted as a shared visit with non-physician practitioner(s) or resident  and myself.  I personally evaluated the patient during the encounter and agree with the findings.   I have personally reviewed any xrays and/ or EKG's with the provider and I agree with interpretation.   Patient had recent episode of confusion and bilateral shakiness not feeling his normal self. Nonfocal symptoms at home. Patient back to normal here possibly related to narcotics as he's been taking more recently for worsening pain. Patient denies stroke history and his normal neuro exam in ER. Discussed observation for possible TIA versus very close followup outpatient. Patient's capacity make decisions and his wife is a nurse will be with him tonight and they preferred to go home. CT head unremarkable. 5+ strength in UE and LE with f/e at major joints. Sensation to palpation intact in UE and LE. CNs 2-12 grossly intact.  EOMFI.  PERRL.   Finger nose and coordination intact bilateral.   Visual fields intact to finger testing. Dg Chest 2 View  04/15/2014   CLINICAL DATA:  Shortness of breath, weakness.  EXAM: CHEST  2 VIEW  COMPARISON:  None.  FINDINGS: Heart and mediastinal contours are within normal limits. No focal opacities or effusions. No acute bony abnormality.  IMPRESSION: No active cardiopulmonary disease.   Electronically Signed   By: Charlett Nose M.D.   On: 04/15/2014 16:18   Ct Head Wo Contrast  04/15/2014   CLINICAL DATA:  63 year old male with weakness, confusion and blurred vision.  EXAM: CT HEAD WITHOUT CONTRAST  TECHNIQUE: Contiguous axial images were obtained from the base of the skull through the vertex without intravenous contrast.  COMPARISON:  None.  FINDINGS: No intracranial abnormalities are identified, including mass lesion or mass effect, hydrocephalus, extra-axial fluid collection, midline shift, hemorrhage, or acute infarction.  The visualized  bony calvarium is unremarkable.  IMPRESSION: Unremarkable noncontrast head CT.   Electronically Signed   By: Laveda Abbe M.D.   On: 04/15/2014 16:30   Transient confusion  Enid Skeens, MD 04/15/14 [phone]

## 2014-04-15 NOTE — ED Notes (Signed)
CBG is 95. Notified Nurse Marcelino Duster.

## 2014-04-15 NOTE — ED Notes (Signed)
Patient transported to X-ray 

## 2014-04-15 NOTE — ED Notes (Signed)
Woke up at 1230; started to shake and had blurred vision, felt like all was going black, weakness. Called EMS. Alert, but confused on arrival. In route, normal oriented alertness. States currently feels tired.

## 2014-04-15 NOTE — ED Provider Notes (Signed)
CSN: [id number]     Arrival date & time 04/15/14  1510 History   None    Chief Complaint  Patient presents with  . Altered Mental Status  . Weakness     (Consider location/radiation/quality/duration/timing/severity/associated sxs/prior Treatment) HPI Comments: Patient is a 63 year old male past medical history significant for arthritis, depression, hyperlipidemia, back pain, peripheral neuropathy presenting to the ED for an episode of pre-syncopal symptoms including blurred vision, shakiness, generalized weakness, chest palpitations. Patient states he called 911 and they said he sounded confused. Patient denies any LOC or syncope. Symptoms have resolved and patient's symptoms are all gone. Per the patient's wife, she believes he may be taking more of his pain medication than advised, she believes he is taking his medications 1 tablet Q2-3H instead of Q4-6H, as he forgets when he takes his medicine.   Patient is a 63 y.o. male presenting with altered mental status and weakness.  Altered Mental Status Associated symptoms: weakness (generalized (resolved))   Associated symptoms: no fever   Weakness Associated symptoms include weakness (generalized (resolved)). Pertinent negatives include no chills or fever.    Past Medical History  Diagnosis Date  . Recurrent upper respiratory infection (URI)     SOB CURRENT COLD  . Neuromuscular disorder     DIZZINESS   PERIPHERAL NEUROPATHY   . Arthritis   . Frequency of urination     URINATION AT NIGHT, PAINFUL SLOW STREAM   . No pertinent past medical history     RINGING RIGHT EAR  . Depression   . Shortness of breath     d/t "cold" 12/02/11  . Bronchitis with asthma, acute April 2013  . Hyperlipidemia    Past Surgical History  Procedure Laterality Date  . Hernia repair      UMBILICAL HERNIA 2011  . Finger surgery      RIGHT MIDDLE FINGER  . Multiple tooth extractions    . Lumbar laminectomy/decompression microdiscectomy  12/22/2011     Procedure: LUMBAR LAMINECTOMY/DECOMPRESSION MICRODISCECTOMY 1 LEVEL;  Surgeon: Maeola Harman, MD;  Location: [facility];  Service: Neurosurgery;  Laterality: N/A;  Thoracic ten-eleven laminectomy   History reviewed. No pertinent family history. History  Substance Use Topics  . Smoking status: Former Games developer  . Smokeless tobacco: Never Used  . Alcohol Use: No    Review of Systems  Constitutional: Negative for fever and chills.  Neurological: Positive for weakness (generalized (resolved)).  All other systems reviewed and are negative.     Allergies  Review of patient's allergies indicates no known allergies.  Home Medications   Prior to Admission medications   Medication Sig Start Date End Date Taking? Authorizing Provider  aspirin EC 81 MG tablet Take 162 mg by mouth daily.    Yes Historical Provider, MD  gabapentin (NEURONTIN) 100 MG capsule Take 1 capsule (100 mg total) by mouth 4 (four) times daily. Take with  tabs 10/25/13  Yes York Spaniel, MD  gabapentin (NEURONTIN) 800 MG tablet Take 1 tablet (800 mg total) by mouth 4 (four) times daily. Take with  caps 10/25/13  Yes York Spaniel, MD  HYDROcodone-acetaminophen [facility]) 5-325 MG per tablet Take 1 tablet by mouth every 6 (six) hours as needed for pain. 01/14/13  Yes Marissa Sciacca, PA-C  imipramine (TOFRANIL) 25 MG tablet Take 6 tablets (150 mg total) by mouth at bedtime. 10/25/13  Yes York Spaniel, MD  pregabalin (LYRICA) 100 MG capsule Take 1 capsule (100 mg total) by mouth 2 (two) times  daily. 10/25/13  Yes York Spaniel, MD  senna-docusate (SENNA S) 8.6-50 MG per tablet Take 4 tablets by mouth at bedtime.    Yes Historical Provider, MD  tamsulosin (FLOMAX) 0.4 MG CAPS capsule Take 1 capsule (0.4 mg total) by mouth daily after supper. 12/28/13  Yes York Spaniel, MD  traMADol (ULTRAM) 50 MG tablet Take 100 mg by mouth every 6 (six) hours as needed (pain).   Yes Historical Provider, MD  zonisamide  (ZONEGRAN) 100 MG capsule Take 3 capsules (300 mg total) by mouth at bedtime. 10/25/13  Yes York Spaniel, MD   BP 155/94  Pulse 73  Temp(Src) 98.7 F (37.1 C) (Oral)  Resp 16  Ht  (1.778 m)  Wt 214 lb (97.07 kg)  BMI 30.71 kg/m2  SpO2 99% Physical Exam  Nursing note and vitals reviewed. Constitutional: He is oriented to person, place, and time. He appears well-developed and well-nourished. No distress.  HENT:  Head: Normocephalic and atraumatic.  Right Ear: External ear normal.  Left Ear: External ear normal.  Nose: Nose normal.  Mouth/Throat: Oropharynx is clear and moist. No oropharyngeal exudate.  Eyes: Conjunctivae and EOM are normal. Pupils are equal, round, and reactive to light.  Neck: Normal range of motion. Neck supple.  Cardiovascular: Normal rate, regular rhythm, normal heart sounds and intact distal pulses.   Pulmonary/Chest: Effort normal and breath sounds normal. No respiratory distress.  Abdominal: Soft. There is no tenderness.  Neurological: He is alert and oriented to person, place, and time. He has normal strength. No cranial nerve deficit. Gait normal. GCS eye subscore is 4. GCS verbal subscore is 5. GCS motor subscore is 6.  Sensation grossly intact.  No pronator drift.  Bilateral heel-knee-shin intact.  Skin: Skin is warm and dry. He is not diaphoretic.  Psychiatric: He has a normal mood and affect. His speech is normal.    ED Course  Procedures (including critical care time) Medications  sodium chloride 0.9 % bolus 1,000 mL (1,000 mLs Intravenous New Bag/Given 04/15/14 1646)    Labs Review Labs Reviewed  URINALYSIS, ROUTINE W REFLEX MICROSCOPIC - Abnormal; Notable for the following:    APPearance HAZY (*)    All other components within normal limits  URINE CULTURE  CBC WITH DIFFERENTIAL  COMPREHENSIVE METABOLIC PANEL  TROPONIN I  URINE RAPID DRUG SCREEN (HOSP PERFORMED)  CBG MONITORING, ED    Imaging Review Dg Chest 2  View  04/15/2014   CLINICAL DATA:  Shortness of breath, weakness.  EXAM: CHEST  2 VIEW  COMPARISON:  None.  FINDINGS: Heart and mediastinal contours are within normal limits. No focal opacities or effusions. No acute bony abnormality.  IMPRESSION: No active cardiopulmonary disease.   Electronically Signed   By: Charlett Nose M.D.   On: 04/15/2014 16:18   Ct Head Wo Contrast  04/15/2014   CLINICAL DATA:  63 year old male with weakness, confusion and blurred vision.  EXAM: CT HEAD WITHOUT CONTRAST  TECHNIQUE: Contiguous axial images were obtained from the base of the skull through the vertex without intravenous contrast.  COMPARISON:  None.  FINDINGS: No intracranial abnormalities are identified, including mass lesion or mass effect, hydrocephalus, extra-axial fluid collection, midline shift, hemorrhage, or acute infarction.  The visualized bony calvarium is unremarkable.  IMPRESSION: Unremarkable noncontrast head CT.   Electronically Signed   By: Laveda Abbe M.D.   On: 04/15/2014 16:30     EKG Interpretation None      4:38 PM On re-evaluation,  no complaints. Patient resting comfortably. AAOx4.   MDM   Final diagnoses:  None    Filed Vitals:   04/15/14 1631  BP: 155/94  Pulse: 73  Temp:   Resp: 16   Afebrile, NAD, non-toxic appearing, AAOx4.  No neurofocal deficits on examination. I have reviewed nursing notes, vital signs, and all appropriate lab and imaging results for this patient. Discussed options with patient and family regarding possible TIA vs vasovagal symptoms with conservative option being admission or discharge home with close observation by wife who is a Engineer, civil (consulting) with PCP f/u. Patient and family prefer discharge pending any abnormalities on lab results. Will sign patient out to [employer], PA-C. Patient d/w with Dr. Jodi Mourning, agrees with plan.        Jeannetta Ellis, PA-C 04/15/14 1658

## 2014-04-15 NOTE — ED Notes (Signed)
Patient returned from X-ray 

## 2014-04-17 LAB — URINE CULTURE
Colony Count: NO GROWTH
Culture: NO GROWTH

## 2014-04-23 ENCOUNTER — Encounter: Payer: Self-pay | Admitting: Nurse Practitioner

## 2014-04-24 ENCOUNTER — Encounter (HOSPITAL_COMMUNITY)
Admission: RE | Admit: 2014-04-24 | Discharge: 2014-04-24 | Disposition: A | Discharge status: Home / Self Care | Payer: No Typology Code available for payment source | Source: Ambulatory Visit | Attending: Neurosurgery | Admitting: Neurosurgery

## 2014-04-24 ENCOUNTER — Encounter (HOSPITAL_COMMUNITY): Payer: Self-pay

## 2014-04-24 DIAGNOSIS — Z01818 Encounter for other preprocedural examination: Secondary | ICD-10-CM | POA: Diagnosis present

## 2014-04-24 DIAGNOSIS — M5126 Other intervertebral disc displacement, lumbar region: Secondary | ICD-10-CM | POA: Diagnosis not present

## 2014-04-24 HISTORY — DX: Polyneuropathy, unspecified: G62.9

## 2014-04-24 HISTORY — DX: Gastro-esophageal reflux disease without esophagitis: K21.9

## 2014-04-24 LAB — CBC
HCT: 40.2 % (ref 39.0–52.0)
Hemoglobin: 13.6 g/dL (ref 13.0–17.0)
MCH: 30.3 pg (ref 26.0–34.0)
MCHC: 33.8 g/dL (ref 30.0–36.0)
MCV: 89.5 fL (ref 78.0–100.0)
Platelets: 227 10*3/uL (ref 150–400)
RBC: 4.49 MIL/uL (ref 4.22–5.81)
RDW: 13 % (ref 11.5–15.5)
WBC: 6.7 10*3/uL (ref 4.0–10.5)

## 2014-04-24 LAB — BASIC METABOLIC PANEL
Anion gap: 11 (ref 5–15)
BUN: 29 mg/dL — ABNORMAL HIGH (ref 6–23)
CO2: 23 mEq/L (ref 19–32)
Calcium: 8.8 mg/dL (ref 8.4–10.5)
Chloride: 104 mEq/L (ref 96–112)
Creatinine, Ser: 1.14 mg/dL (ref 0.50–1.35)
GFR calc Af Amer: 78 mL/min — ABNORMAL LOW (ref >90)
GFR calc non Af Amer: 67 mL/min — ABNORMAL LOW (ref >90)
Glucose, Bld: 101 mg/dL — ABNORMAL HIGH (ref 70–99)
Potassium: 4.6 mEq/L (ref 3.7–5.3)
Sodium: 138 mEq/L (ref 137–147)

## 2014-04-24 LAB — SURGICAL PCR SCREEN
MRSA, PCR: POSITIVE — AB
Staphylococcus aureus: POSITIVE — AB

## 2014-04-24 NOTE — Progress Notes (Signed)
04/24/14 1026  OBSTRUCTIVE SLEEP APNEA  Have you ever been diagnosed with sleep apnea through a sleep study? No  Do you snore loudly (loud enough to be heard through closed doors)?  1  Has anyone observed you stop breathing during your sleep? 0  Do you have, or are you being treated for high blood pressure? 0  BMI more than 35 kg/m2? 0  Age over 63 years old? 1  Neck circumference greater than 40 cm/16 inches? 1 (16.5)  Gender: 1  Obstructive Sleep Apnea Score 4  Score 4 or greater  Results sent to PCP

## 2014-04-24 NOTE — Pre-Procedure Instructions (Addendum)
Arthur Smith  04/24/2014   Your procedure is scheduled on:  05/01/14  Report to [facility] short stay admitting at 530 AM.  Call this number if you have problems the morning of surgery: [phone]   Remember:   Do not eat food or drink liquids after midnight.   Take these medicines the morning of surgery with A SIP OF WATER: pain med if needed     Take all med as ordered until day of surgery except as instructed below or per dr    Arthur Smith all herbel meds, nsaids (aleve,naproxen,advil,ibuprofen) 5 days prior to surgery including vitamins, aspirin(04/26/14)   Do not wear jewelry, make-up or nail polish.  Do not wear lotions, powders, or perfumes. You may wear deodorant.  Do not shave 48 hours prior to surgery. Men may shave face and neck.  Do not bring valuables to the hospital.  [facility] is not responsible                  for any belongings or valuables.               Contacts, dentures or bridgework may not be worn into surgery.  Leave suitcase in the car. After surgery it may be brought to your room.  For patients admitted to the hospital, discharge time is determined by your                treatment team.               Patients discharged the day of surgery will not be allowed to drive  home.  Name and phone number of your driver:   Special Instructions:  Special Instructions: [place] - Preparing for Surgery  Before surgery, you can play an important role.  Because skin is not sterile, your skin needs to be as free of germs as possible.  You can reduce the number of germs on you skin by washing with CHG (chlorahexidine gluconate) soap before surgery.  CHG is an antiseptic cleaner which kills germs and bonds with the skin to continue killing germs even after washing.  Please DO NOT use if you have an allergy to CHG or antibacterial soaps.  If your skin becomes reddened/irritated stop using the CHG and inform your nurse when you arrive at Short Stay.  Do not shave (including  legs and underarms) for at least 48 hours prior to the first CHG shower.  You may shave your face.  Please follow these instructions carefully:   1.  Shower with CHG Soap the night before surgery and the morning of Surgery.  2.  If you choose to wash your hair, wash your hair first as usual with your normal shampoo.  3.  After you shampoo, rinse your hair and body thoroughly to remove the Shampoo.  4.  Use CHG as you would any other liquid soap.  You can apply chg directly  to the skin and wash gently with scrungie or a clean washcloth.  5.  Apply the CHG Soap to your body ONLY FROM THE NECK DOWN.  Do not use on open wounds or open sores.  Avoid contact with your eyes ears, mouth and genitals (private parts).  Wash genitals (private parts)       with your normal soap.  6.  Wash thoroughly, paying special attention to the area where your surgery will be performed.  7.  Thoroughly rinse your body with warm water from the neck down.  8.  DO NOT shower/wash with your normal soap after using and rinsing off the CHG Soap.  9.  Pat yourself dry with a clean towel.            10.  Wear clean pajamas.            11.  Place clean sheets on your bed the night of your first shower and do not sleep with pets.  Day of Surgery  Do not apply any lotions/deodorants the morning of surgery.  Please wear clean clothes to the hospital/surgery center.   Please read over the following fact sheets that you were given: Pain Booklet, Coughing and Deep Breathing, MRSA Information and Surgical Site Infection Prevention

## 2014-04-30 MED ORDER — CEFAZOLIN SODIUM-DEXTROSE 2-3 GM-% IV SOLR
2.0000 g | INTRAVENOUS | Status: AC
  Administered 2014-05-01: 2 g via INTRAVENOUS
  Filled 2014-04-30: qty 50

## 2014-05-01 ENCOUNTER — Encounter (HOSPITAL_COMMUNITY): Payer: Self-pay | Admitting: *Deleted

## 2014-05-01 ENCOUNTER — Ambulatory Visit (HOSPITAL_COMMUNITY): Payer: No Typology Code available for payment source | Admitting: Anesthesiology

## 2014-05-01 ENCOUNTER — Ambulatory Visit (HOSPITAL_COMMUNITY)
Admission: RE | Admit: 2014-05-01 | Discharge: 2014-05-01 | Disposition: A | Discharge status: Home / Self Care | Payer: No Typology Code available for payment source | Source: Ambulatory Visit | Attending: Neurosurgery | Admitting: Neurosurgery

## 2014-05-01 ENCOUNTER — Observation Stay (HOSPITAL_COMMUNITY): Payer: No Typology Code available for payment source

## 2014-05-01 ENCOUNTER — Encounter (HOSPITAL_COMMUNITY): Payer: No Typology Code available for payment source | Admitting: Anesthesiology

## 2014-05-01 ENCOUNTER — Encounter (HOSPITAL_COMMUNITY)
Admission: RE | Disposition: A | Discharge status: Home / Self Care | Payer: Self-pay | Source: Ambulatory Visit | Attending: Neurosurgery

## 2014-05-01 DIAGNOSIS — G609 Hereditary and idiopathic neuropathy, unspecified: Secondary | ICD-10-CM | POA: Insufficient documentation

## 2014-05-01 DIAGNOSIS — M5126 Other intervertebral disc displacement, lumbar region: Secondary | ICD-10-CM | POA: Diagnosis not present

## 2014-05-01 DIAGNOSIS — M47817 Spondylosis without myelopathy or radiculopathy, lumbosacral region: Secondary | ICD-10-CM | POA: Insufficient documentation

## 2014-05-01 HISTORY — PX: LUMBAR LAMINECTOMY/DECOMPRESSION MICRODISCECTOMY: SHX5026

## 2014-05-01 SURGERY — LUMBAR LAMINECTOMY/DECOMPRESSION MICRODISCECTOMY 1 LEVEL
Anesthesia: General | Site: Back | Laterality: Right

## 2014-05-01 MED ORDER — GABAPENTIN 800 MG PO TABS: 800.0000 mg | ORAL_TABLET | Freq: Four times a day (QID) | ORAL | Status: DC

## 2014-05-01 MED ORDER — HYDROCODONE-ACETAMINOPHEN 5-325 MG PO TABS: 1.0000 | ORAL_TABLET | ORAL | Status: DC | PRN

## 2014-05-01 MED ORDER — ZONISAMIDE 100 MG PO CAPS
300.0000 mg | ORAL_CAPSULE | Freq: Every day | ORAL | Status: DC
  Filled 2014-05-01: qty 3

## 2014-05-01 MED ORDER — MIDAZOLAM HCL 2 MG/2ML IJ SOLN
INTRAMUSCULAR | Status: AC
  Filled 2014-05-01: qty 2

## 2014-05-01 MED ORDER — IMIPRAMINE HCL 50 MG PO TABS
150.0000 mg | ORAL_TABLET | Freq: Every day | ORAL | Status: DC
  Filled 2014-05-01: qty 3

## 2014-05-01 MED ORDER — PANTOPRAZOLE SODIUM 40 MG IV SOLR
40.0000 mg | Freq: Every day | INTRAVENOUS | Status: DC
  Filled 2014-05-01: qty 40

## 2014-05-01 MED ORDER — INFLUENZA VAC SPLIT QUAD 0.5 ML IM SUSY
0.5000 mL | PREFILLED_SYRINGE | INTRAMUSCULAR | Status: AC | PRN
  Administered 2014-05-01: 0.5 mL via INTRAMUSCULAR
  Filled 2014-05-01: qty 0.5

## 2014-05-01 MED ORDER — PHENYLEPHRINE HCL 10 MG/ML IJ SOLN
INTRAMUSCULAR | Status: DC | PRN
  Administered 2014-05-01: 80 ug via INTRAVENOUS

## 2014-05-01 MED ORDER — VANCOMYCIN HCL IN DEXTROSE 1-5 GM/200ML-% IV SOLN
INTRAVENOUS | Status: AC
  Administered 2014-05-01: 1000 mg via INTRAVENOUS
  Filled 2014-05-01: qty 200

## 2014-05-01 MED ORDER — PROPOFOL 10 MG/ML IV BOLUS
INTRAVENOUS | Status: DC | PRN
  Administered 2014-05-01: 10 mg via INTRAVENOUS
  Administered 2014-05-01: 130 mg via INTRAVENOUS

## 2014-05-01 MED ORDER — GLYCOPYRROLATE 0.2 MG/ML IJ SOLN
INTRAMUSCULAR | Status: DC | PRN
  Administered 2014-05-01: 0.6 mg via INTRAVENOUS

## 2014-05-01 MED ORDER — MIDAZOLAM HCL 5 MG/5ML IJ SOLN
INTRAMUSCULAR | Status: DC | PRN
  Administered 2014-05-01 (×2): 1 mg via INTRAVENOUS

## 2014-05-01 MED ORDER — FENTANYL CITRATE 0.05 MG/ML IJ SOLN
INTRAMUSCULAR | Status: AC
  Filled 2014-05-01: qty 2

## 2014-05-01 MED ORDER — METHOCARBAMOL 500 MG PO TABS
500.0000 mg | ORAL_TABLET | Freq: Four times a day (QID) | ORAL | Status: DC | PRN
  Administered 2014-05-01: 500 mg via ORAL
  Filled 2014-05-01: qty 1

## 2014-05-01 MED ORDER — B COMPLEX PO TABS: 1.0000 | ORAL_TABLET | Freq: Every day | ORAL | Status: DC

## 2014-05-01 MED ORDER — LIDOCAINE HCL (CARDIAC) 20 MG/ML IV SOLN
INTRAVENOUS | Status: AC
  Filled 2014-05-01: qty 5

## 2014-05-01 MED ORDER — PREGABALIN 100 MG PO CAPS
100.0000 mg | ORAL_CAPSULE | Freq: Two times a day (BID) | ORAL | Status: DC
  Administered 2014-05-01: 100 mg via ORAL
  Filled 2014-05-01: qty 1

## 2014-05-01 MED ORDER — MORPHINE SULFATE 2 MG/ML IJ SOLN: 1.0000 mg | INTRAMUSCULAR | Status: DC | PRN

## 2014-05-01 MED ORDER — ONDANSETRON HCL 4 MG/2ML IJ SOLN: 4.0000 mg | INTRAMUSCULAR | Status: DC | PRN

## 2014-05-01 MED ORDER — OXYCODONE-ACETAMINOPHEN 5-325 MG PO TABS
1.0000 | ORAL_TABLET | ORAL | Status: DC | PRN
  Administered 2014-05-01: 2 via ORAL
  Filled 2014-05-01: qty 2

## 2014-05-01 MED ORDER — POLYETHYLENE GLYCOL 3350 17 G PO PACK
17.0000 g | PACK | Freq: Every day | ORAL | Status: DC | PRN
  Filled 2014-05-01: qty 1

## 2014-05-01 MED ORDER — DOCUSATE SODIUM 100 MG PO CAPS
100.0000 mg | ORAL_CAPSULE | Freq: Two times a day (BID) | ORAL | Status: DC
  Administered 2014-05-01: 100 mg via ORAL
  Filled 2014-05-01 (×2): qty 1

## 2014-05-01 MED ORDER — HYDROMORPHONE HCL PF 1 MG/ML IJ SOLN
INTRAMUSCULAR | Status: AC
  Filled 2014-05-01: qty 1

## 2014-05-01 MED ORDER — ONDANSETRON HCL 4 MG/2ML IJ SOLN
INTRAMUSCULAR | Status: AC
  Filled 2014-05-01: qty 2

## 2014-05-01 MED ORDER — OXYCODONE HCL 5 MG PO TABS: 5.0000 mg | ORAL_TABLET | Freq: Once | ORAL | Status: DC | PRN

## 2014-05-01 MED ORDER — VANCOMYCIN HCL IN DEXTROSE 1-5 GM/200ML-% IV SOLN
1000.0000 mg | Freq: Once | INTRAVENOUS | Status: DC
  Filled 2014-05-01: qty 200

## 2014-05-01 MED ORDER — HEMOSTATIC AGENTS (NO CHARGE) OPTIME
TOPICAL | Status: DC | PRN
  Administered 2014-05-01: 1 via TOPICAL

## 2014-05-01 MED ORDER — FENTANYL CITRATE 0.05 MG/ML IJ SOLN
INTRAMUSCULAR | Status: DC | PRN
  Administered 2014-05-01: 50 ug via INTRAVENOUS
  Administered 2014-05-01 (×2): 25 ug via INTRAVENOUS
  Administered 2014-05-01: 100 ug via INTRAVENOUS
  Administered 2014-05-01: 50 ug via INTRAVENOUS

## 2014-05-01 MED ORDER — LIDOCAINE HCL (CARDIAC) 20 MG/ML IV SOLN
INTRAVENOUS | Status: DC | PRN
  Administered 2014-05-01: 50 mg via INTRAVENOUS
  Administered 2014-05-01: 30 mg via INTRAVENOUS

## 2014-05-01 MED ORDER — HYDROCODONE-ACETAMINOPHEN 5-325 MG PO TABS: 1.0000 | ORAL_TABLET | Freq: Four times a day (QID) | ORAL | Status: DC | PRN

## 2014-05-01 MED ORDER — KCL IN DEXTROSE-NACL 20-5-0.45 MEQ/L-%-% IV SOLN
INTRAVENOUS | Status: DC
  Filled 2014-05-01 (×2): qty 1000

## 2014-05-01 MED ORDER — MENTHOL 3 MG MT LOZG: 1.0000 | LOZENGE | OROMUCOSAL | Status: DC | PRN

## 2014-05-01 MED ORDER — LIDOCAINE-EPINEPHRINE 1 %-1:100000 IJ SOLN
INTRAMUSCULAR | Status: DC | PRN
Start: 2014-05-01 — End: 2014-05-01
  Administered 2014-05-01: 2 mL

## 2014-05-01 MED ORDER — ROCURONIUM BROMIDE 100 MG/10ML IV SOLN
INTRAVENOUS | Status: DC | PRN
  Administered 2014-05-01: 50 mg via INTRAVENOUS

## 2014-05-01 MED ORDER — NEOSTIGMINE METHYLSULFATE 10 MG/10ML IV SOLN
INTRAVENOUS | Status: DC | PRN
  Administered 2014-05-01: 4 mg via INTRAVENOUS

## 2014-05-01 MED ORDER — ZOLPIDEM TARTRATE 5 MG PO TABS: 5.0000 mg | ORAL_TABLET | Freq: Every evening | ORAL | Status: DC | PRN

## 2014-05-01 MED ORDER — PHENOL 1.4 % MT LIQD: 1.0000 | OROMUCOSAL | Status: DC | PRN

## 2014-05-01 MED ORDER — DEXAMETHASONE SODIUM PHOSPHATE 4 MG/ML IJ SOLN
INTRAMUSCULAR | Status: AC
  Filled 2014-05-01: qty 2

## 2014-05-01 MED ORDER — ONDANSETRON HCL 4 MG/2ML IJ SOLN
INTRAMUSCULAR | Status: DC | PRN
Start: 2014-05-01 — End: 2014-05-01
  Administered 2014-05-01: 4 mg via INTRAVENOUS

## 2014-05-01 MED ORDER — METHOCARBAMOL 1000 MG/10ML IJ SOLN
500.0000 mg | Freq: Four times a day (QID) | INTRAMUSCULAR | Status: DC | PRN
  Filled 2014-05-01: qty 5

## 2014-05-01 MED ORDER — 0.9 % SODIUM CHLORIDE (POUR BTL) OPTIME
TOPICAL | Status: DC | PRN
  Administered 2014-05-01: 1000 mL

## 2014-05-01 MED ORDER — METHYLPREDNISOLONE ACETATE 80 MG/ML IJ SUSP
INTRAMUSCULAR | Status: DC | PRN
  Administered 2014-05-01: 80 mg

## 2014-05-01 MED ORDER — HYDROMORPHONE HCL PF 1 MG/ML IJ SOLN
0.2500 mg | INTRAMUSCULAR | Status: DC | PRN
  Administered 2014-05-01: 0.5 mg via INTRAVENOUS

## 2014-05-01 MED ORDER — ALBUTEROL SULFATE (2.5 MG/3ML) 0.083% IN NEBU: 2.5000 mg | INHALATION_SOLUTION | Freq: Four times a day (QID) | RESPIRATORY_TRACT | Status: DC | PRN

## 2014-05-01 MED ORDER — PNEUMOCOCCAL VAC POLYVALENT 25 MCG/0.5ML IJ INJ
0.5000 mL | INJECTION | INTRAMUSCULAR | Status: AC | PRN
  Administered 2014-05-01: 0.5 mL via INTRAMUSCULAR
  Filled 2014-05-01: qty 0.5

## 2014-05-01 MED ORDER — ACETAMINOPHEN 650 MG RE SUPP: 650.0000 mg | RECTAL | Status: DC | PRN

## 2014-05-01 MED ORDER — FENTANYL CITRATE 0.05 MG/ML IJ SOLN
INTRAMUSCULAR | Status: AC
  Filled 2014-05-01: qty 5

## 2014-05-01 MED ORDER — DEXAMETHASONE SODIUM PHOSPHATE 4 MG/ML IJ SOLN
INTRAMUSCULAR | Status: DC | PRN
  Administered 2014-05-01: 8 mg via INTRAVENOUS

## 2014-05-01 MED ORDER — BISACODYL 10 MG RE SUPP: 10.0000 mg | Freq: Every day | RECTAL | Status: DC | PRN

## 2014-05-01 MED ORDER — GLYCOPYRROLATE 0.2 MG/ML IJ SOLN
INTRAMUSCULAR | Status: AC
Start: 2014-05-01 — End: 2014-05-01
  Filled 2014-05-01: qty 3

## 2014-05-01 MED ORDER — GABAPENTIN 300 MG PO CAPS
900.0000 mg | ORAL_CAPSULE | Freq: Four times a day (QID) | ORAL | Status: DC
  Administered 2014-05-01: 900 mg via ORAL
  Filled 2014-05-01 (×3): qty 3

## 2014-05-01 MED ORDER — OXYCODONE HCL 5 MG/5ML PO SOLN: 5.0000 mg | Freq: Once | ORAL | Status: DC | PRN

## 2014-05-01 MED ORDER — PROPOFOL 10 MG/ML IV BOLUS
INTRAVENOUS | Status: AC
  Filled 2014-05-01: qty 20

## 2014-05-01 MED ORDER — BUPIVACAINE HCL (PF) 0.5 % IJ SOLN
INTRAMUSCULAR | Status: DC | PRN
Start: 2014-05-01 — End: 2014-05-01
  Administered 2014-05-01: 2 mL

## 2014-05-01 MED ORDER — LACTATED RINGERS IV SOLN
INTRAVENOUS | Status: DC | PRN
  Administered 2014-05-01 (×2): via INTRAVENOUS

## 2014-05-01 MED ORDER — THROMBIN 5000 UNITS EX SOLR
CUTANEOUS | Status: DC | PRN
  Administered 2014-05-01 (×2): 5000 [IU] via TOPICAL

## 2014-05-01 MED ORDER — SODIUM CHLORIDE 0.9 % IJ SOLN: 3.0000 mL | INTRAMUSCULAR | Status: DC | PRN

## 2014-05-01 MED ORDER — ALUM & MAG HYDROXIDE-SIMETH 200-200-20 MG/5ML PO SUSP
30.0000 mL | Freq: Four times a day (QID) | ORAL | Status: DC | PRN
Start: 2014-05-01 — End: 2014-05-01

## 2014-05-01 MED ORDER — SENNOSIDES-DOCUSATE SODIUM 8.6-50 MG PO TABS
4.0000 | ORAL_TABLET | Freq: Every day | ORAL | Status: DC
  Filled 2014-05-01: qty 4

## 2014-05-01 MED ORDER — SODIUM CHLORIDE 0.9 % IJ SOLN
3.0000 mL | Freq: Two times a day (BID) | INTRAMUSCULAR | Status: DC
  Administered 2014-05-01: 3 mL via INTRAVENOUS

## 2014-05-01 MED ORDER — ACETAMINOPHEN 325 MG PO TABS: 650.0000 mg | ORAL_TABLET | ORAL | Status: DC | PRN

## 2014-05-01 MED ORDER — OXYCODONE-ACETAMINOPHEN 10-325 MG PO TABS: 1.0000 | ORAL_TABLET | ORAL | Status: DC | PRN

## 2014-05-01 MED ORDER — FENTANYL CITRATE 0.05 MG/ML IJ SOLN
INTRAMUSCULAR | Status: DC | PRN
  Administered 2014-05-01: 100 ug via INTRAVENOUS

## 2014-05-01 MED ORDER — NEOSTIGMINE METHYLSULFATE 10 MG/10ML IV SOLN
INTRAVENOUS | Status: AC
  Filled 2014-05-01: qty 1

## 2014-05-01 MED ORDER — FLEET ENEMA 7-19 GM/118ML RE ENEM
1.0000 | ENEMA | Freq: Once | RECTAL | Status: DC | PRN
  Filled 2014-05-01: qty 1

## 2014-05-01 MED ORDER — ROCURONIUM BROMIDE 50 MG/5ML IV SOLN
INTRAVENOUS | Status: AC
  Filled 2014-05-01: qty 1

## 2014-05-01 MED ORDER — SENNA 8.6 MG PO TABS: 1.0000 | ORAL_TABLET | Freq: Two times a day (BID) | ORAL | Status: DC

## 2014-05-01 MED ORDER — B COMPLEX-C PO TABS
1.0000 | ORAL_TABLET | Freq: Every day | ORAL | Status: DC
  Administered 2014-05-01: 1 via ORAL
  Filled 2014-05-01: qty 1

## 2014-05-01 MED ORDER — TRAMADOL HCL 50 MG PO TABS: 100.0000 mg | ORAL_TABLET | Freq: Four times a day (QID) | ORAL | Status: DC | PRN

## 2014-05-01 MED ORDER — GABAPENTIN 100 MG PO CAPS: 100.0000 mg | ORAL_CAPSULE | Freq: Four times a day (QID) | ORAL | Status: DC

## 2014-05-01 MED ORDER — TAMSULOSIN HCL 0.4 MG PO CAPS
0.4000 mg | ORAL_CAPSULE | Freq: Every day | ORAL | Status: DC
  Filled 2014-05-01: qty 1

## 2014-05-01 SURGICAL SUPPLY — 63 items
BAG DECANTER FOR FLEXI CONT (MISCELLANEOUS) IMPLANT
BENZOIN TINCTURE PRP APPL 2/3 (GAUZE/BANDAGES/DRESSINGS) IMPLANT
BIT DRILL NEURO 2X3.1 SFT TUCH (MISCELLANEOUS) ×1 IMPLANT
BLADE SURG ROTATE 9660 (MISCELLANEOUS) IMPLANT
BUR ROUND FLUTED 5 RND (BURR) ×2 IMPLANT
CANISTER SUCT 3000ML (MISCELLANEOUS) ×2 IMPLANT
CONT SPEC 4OZ CLIKSEAL STRL BL (MISCELLANEOUS) ×2 IMPLANT
DERMABOND ADHESIVE PROPEN (GAUZE/BANDAGES/DRESSINGS) ×1
DERMABOND ADVANCED (GAUZE/BANDAGES/DRESSINGS)
DERMABOND ADVANCED .7 DNX12 (GAUZE/BANDAGES/DRESSINGS) IMPLANT
DERMABOND ADVANCED .7 DNX6 (GAUZE/BANDAGES/DRESSINGS) ×1 IMPLANT
DRAPE LAPAROTOMY 100X72X124 (DRAPES) ×2 IMPLANT
DRAPE MICROSCOPE LEICA (MISCELLANEOUS) ×2 IMPLANT
DRAPE POUCH INSTRU U-SHP 10X18 (DRAPES) ×2 IMPLANT
DRAPE SURG 17X23 STRL (DRAPES) ×2 IMPLANT
DRILL NEURO 2X3.1 SOFT TOUCH (MISCELLANEOUS) ×2
DRSG OPSITE POSTOP 3X4 (GAUZE/BANDAGES/DRESSINGS) ×2 IMPLANT
DRSG TELFA 3X8 NADH (GAUZE/BANDAGES/DRESSINGS) IMPLANT
DURAPREP 26ML APPLICATOR (WOUND CARE) ×2 IMPLANT
ELECT BLADE 4.0 EZ CLEAN MEGAD (MISCELLANEOUS) ×2
ELECT REM PT RETURN 9FT ADLT (ELECTROSURGICAL) ×2
ELECTRODE BLDE 4.0 EZ CLN MEGD (MISCELLANEOUS) ×1 IMPLANT
ELECTRODE REM PT RTRN 9FT ADLT (ELECTROSURGICAL) ×1 IMPLANT
GAUZE SPONGE 4X4 12PLY STRL (GAUZE/BANDAGES/DRESSINGS) IMPLANT
GAUZE SPONGE 4X4 16PLY XRAY LF (GAUZE/BANDAGES/DRESSINGS) IMPLANT
GLOVE BIO SURGEON STRL SZ8 (GLOVE) ×2 IMPLANT
GLOVE BIOGEL PI IND STRL 7.0 (GLOVE) ×2 IMPLANT
GLOVE BIOGEL PI IND STRL 8 (GLOVE) ×1 IMPLANT
GLOVE BIOGEL PI IND STRL 8.5 (GLOVE) ×1 IMPLANT
GLOVE BIOGEL PI INDICATOR 7.0 (GLOVE) ×2
GLOVE BIOGEL PI INDICATOR 8 (GLOVE) ×1
GLOVE BIOGEL PI INDICATOR 8.5 (GLOVE) ×1
GLOVE ECLIPSE 8.0 STRL XLNG CF (GLOVE) ×2 IMPLANT
GLOVE EXAM NITRILE LRG STRL (GLOVE) IMPLANT
GLOVE EXAM NITRILE MD LF STRL (GLOVE) IMPLANT
GLOVE EXAM NITRILE XL STR (GLOVE) IMPLANT
GLOVE EXAM NITRILE XS STR PU (GLOVE) IMPLANT
GLOVE SURG SS PI 7.0 STRL IVOR (GLOVE) ×6 IMPLANT
GOWN STRL REUS W/ TWL LRG LVL3 (GOWN DISPOSABLE) ×1 IMPLANT
GOWN STRL REUS W/ TWL XL LVL3 (GOWN DISPOSABLE) IMPLANT
GOWN STRL REUS W/TWL 2XL LVL3 (GOWN DISPOSABLE) ×2 IMPLANT
GOWN STRL REUS W/TWL LRG LVL3 (GOWN DISPOSABLE) ×1
GOWN STRL REUS W/TWL XL LVL3 (GOWN DISPOSABLE)
KIT BASIN OR (CUSTOM PROCEDURE TRAY) ×2 IMPLANT
KIT ROOM TURNOVER OR (KITS) ×2 IMPLANT
NEEDLE HYPO 18GX1.5 BLUNT FILL (NEEDLE) ×2 IMPLANT
NEEDLE HYPO 25X1 1.5 SAFETY (NEEDLE) ×2 IMPLANT
NEEDLE SPNL 18GX3.5 QUINCKE PK (NEEDLE) ×2 IMPLANT
NS IRRIG 1000ML POUR BTL (IV SOLUTION) ×2 IMPLANT
PACK LAMINECTOMY NEURO (CUSTOM PROCEDURE TRAY) ×2 IMPLANT
PAD ARMBOARD 7.5X6 YLW CONV (MISCELLANEOUS) ×6 IMPLANT
RUBBERBAND STERILE (MISCELLANEOUS) ×4 IMPLANT
SPONGE SURGIFOAM ABS GEL SZ50 (HEMOSTASIS) ×2 IMPLANT
STRIP CLOSURE SKIN 1/2X4 (GAUZE/BANDAGES/DRESSINGS) IMPLANT
SUT VIC AB 0 CT1 18XCR BRD8 (SUTURE) ×1 IMPLANT
SUT VIC AB 0 CT1 8-18 (SUTURE) ×1
SUT VIC AB 2-0 CT1 18 (SUTURE) ×2 IMPLANT
SUT VIC AB 3-0 SH 8-18 (SUTURE) ×2 IMPLANT
SYR 20ML ECCENTRIC (SYRINGE) ×2 IMPLANT
SYR 5ML LL (SYRINGE) ×2 IMPLANT
TOWEL OR 17X24 6PK STRL BLUE (TOWEL DISPOSABLE) ×2 IMPLANT
TOWEL OR 17X26 10 PK STRL BLUE (TOWEL DISPOSABLE) ×2 IMPLANT
WATER STERILE IRR 1000ML POUR (IV SOLUTION) ×2 IMPLANT

## 2014-05-01 NOTE — Progress Notes (Signed)
Patient alert and oriented, mae's well, voiding adequate amount of urine, swallowing without difficulty, no c/o pain. Patient discharged home with family. Script and discharged instructions given to patient. Patient and family stated understanding of d/c instructions given and agree to call the office to schedule f/u appointment. 

## 2014-05-01 NOTE — Progress Notes (Signed)
Awake, alert, conversant.  Full strength, no numbness.  Pain markedly improved.

## 2014-05-01 NOTE — Progress Notes (Signed)
Patient ID: Arthur Smith, male   DOB: 1951/08/04, 63 y.o.   MRN: [id number] Alert, ambulating from bathroom. Incision flat, dry, without drainage or erythema. Honeycomb over Dermabond intact. Good strength BLE. Mild incisional pain. Pt & wife verbalize understanding of d/c instructions & agree to call office to schedle 3-4 week f/u with DrStern. Rx's to chart for Percocet & Tizanidine. Pt will wean off Lyrica & Tramadol, continuing Gabapentin home med.  Georgiann Cocker RN BSN

## 2014-05-01 NOTE — Discharge Summary (Signed)
Physician Discharge Summary  Patient ID: Arthur Smith MRN: [id number] DOB/AGE: 1951/06/06 63 y.o.  Admit date: 05/01/2014 Discharge date: 05/01/2014  Admission Diagnoses: Lumbar herniated nucleus pulposus without myelopathy L 34 Right, lumbar spondylosis, lumbar stenosis, lumbago, lumbar radiculopathy   Discharge Diagnoses: Lumbar herniated nucleus pulposus without myelopathy L 34 Right, lumbar spondylosis, lumbar stenosis, lumbago, lumbar radiculopathy s/p Right Lumbar three-four Microdiskectomy (Right) with microdissection  Active Problems:   Herniated lumbar intervertebral disc   Discharged Condition: good  Hospital Course: [name] was admitted for surgery with Dx HNP and lumbar radiculopathy. Following uncomplicated microdiscectomy at L3-4 on the right, he recovered nicely & transferred to 3500 for observation.   Consults: None  Significant Diagnostic Studies: radiology: X-Ray: intra-operative  Treatments: surgery: Right Lumbar three-four Microdiskectomy (Right) with microdissection   Discharge Exam: Blood pressure 115/71, pulse 88, temperature 97.2 F (36.2 C), temperature source Oral, resp. rate 20, height  (1.753 m), weight 97.75 kg (215 lb 8 oz), SpO2 100.00%. Alert, ambulating from bathroom. Incision flat, dry, without drainage or erythema. Honeycomb over Dermabond intact. Good strength BLE. Mild incisional pain.  Disposition: 01-Home or Self Care  Pt & wife verbalize understanding of d/c instructions & agree to call office to schedle 3-4 week f/u with DrStern. Rx's to chart for Percocet & Tizanidine. Pt will wean off Lyrica & Tramadol, continuing Gabapentin home med.      Medication List    ASK your doctor about these medications       albuterol (2.5 MG/3ML) 0.083% nebulizer solution  Commonly known as:  PROVENTIL  Inhale 2.5 mg into the lungs every 6 (six) hours as needed for wheezing or shortness of breath.     aspirin EC 81 MG tablet  Take 162 mg  by mouth daily.     b complex vitamins tablet  Take 1 tablet by mouth daily.     gabapentin 100 MG capsule  Commonly known as:  NEURONTIN  Take 1 capsule (100 mg total) by mouth 4 (four) times daily. Take with  tabs     gabapentin 800 MG tablet  Commonly known as:  NEURONTIN  Take 1 tablet (800 mg total) by mouth 4 (four) times daily. Take with  caps     HYDROcodone-acetaminophen 5-325 MG per tablet  Commonly known as:  NORCO  Take 1 tablet by mouth every 6 (six) hours as needed for pain.     imipramine 25 MG tablet  Commonly known as:  TOFRANIL  Take 6 tablets (150 mg total) by mouth at bedtime.     oxyCODONE-acetaminophen 10-325 MG per tablet  Commonly known as:  PERCOCET  Take 1 tablet by mouth every 6 (six) hours as needed for pain.     pregabalin 100 MG capsule  Commonly known as:  LYRICA  Take 1 capsule (100 mg total) by mouth 2 (two) times daily.     SENNA S 8.6-50 MG per tablet  Generic drug:  senna-docusate  Take 4 tablets by mouth at bedtime.     tamsulosin 0.4 MG Caps capsule  Commonly known as:  FLOMAX  Take 1 capsule (0.4 mg total) by mouth daily after supper.     traMADol 50 MG tablet  Commonly known as:  ULTRAM  Take 100 mg by mouth every 6 (six) hours as needed (pain).     zonisamide 100 MG capsule  Commonly known as:  ZONEGRAN  Take 3 capsules (300 mg total) by mouth at bedtime.  Signed: Georgiann Cocker 05/01/2014, 2:38 PM

## 2014-05-01 NOTE — Transfer of Care (Signed)
Immediate Anesthesia Transfer of Care Note  Patient: Arthur Smith  Procedure(s) Performed: Procedure(s): Right Lumbar three-four Microdiskectomy (Right)  Patient Location: PACU  Anesthesia Type:General  Level of Consciousness: awake, oriented, patient cooperative and responds to stimulation  Airway & Oxygen Therapy: Patient Spontanous Breathing and Patient connected to face mask oxygen  Post-op Assessment: Report given to PACU RN, Post -op Vital signs reviewed and stable and Patient moving all extremities X 4  Post vital signs: Reviewed and stable  Complications: No apparent anesthesia complications

## 2014-05-01 NOTE — Anesthesia Preprocedure Evaluation (Signed)
Anesthesia Evaluation  Patient identified by MRN, date of birth, ID band Patient awake    Reviewed: Allergy & Precautions, H&P , NPO status , Patient's Chart, lab work & pertinent test results  Airway Mallampati: II TM Distance: >3 FB Neck ROM: Full    Dental no notable dental hx. (+) Edentulous Upper, Edentulous Lower, Dental Advisory Given   Pulmonary asthma , former smoker,  breath sounds clear to auscultation  Pulmonary exam normal       Cardiovascular negative cardio ROS  Rhythm:Regular Rate:Normal     Neuro/Psych Depression negative neurological ROS     GI/Hepatic Neg liver ROS, GERD-  Medicated and Controlled,  Endo/Other  negative endocrine ROS  Renal/GU negative Renal ROS  negative genitourinary   Musculoskeletal  (+) Arthritis -, Osteoarthritis,    Abdominal   Peds  Hematology negative hematology ROS (+)   Anesthesia Other Findings   Reproductive/Obstetrics negative OB ROS                           Anesthesia Physical Anesthesia Plan  ASA: II  Anesthesia Plan: General   Post-op Pain Management:    Induction: Intravenous  Airway Management Planned: Oral ETT  Additional Equipment:   Intra-op Plan:   Post-operative Plan: Extubation in OR  Informed Consent: I have reviewed the patients History and Physical, chart, labs and discussed the procedure including the risks, benefits and alternatives for the proposed anesthesia with the patient or authorized representative who has indicated his/her understanding and acceptance.   Dental advisory given  Plan Discussed with: CRNA  Anesthesia Plan Comments:         Anesthesia Quick Evaluation

## 2014-05-01 NOTE — Op Note (Signed)
05/01/2014  9:22 AM  PATIENT:  Arthur Smith  62 y.o. male  PRE-OPERATIVE DIAGNOSIS:  Lumbar herniated nucleus pulposus without myelopathy L 34 Right, lumbar spondylosis, lumbar stenosis, lumbago, lumbar radiculopathy  POST-OPERATIVE DIAGNOSIS:   Lumbar herniated nucleus pulposus without myelopathy L 34 Right, lumbar spondylosis, lumbar stenosis, lumbago, lumbar radiculopathy   PROCEDURE:  Procedure(s): Right Lumbar three-four Microdiskectomy (Right) with microdissection  SURGEON:  Surgeon(s) and Role:    * Aasir Daigler, MD - Primary  PHYSICIAN ASSISTANT:   ASSISTANTS: Poteat, RN   ANESTHESIA:   general  EBL:  Total I/O In: 1000 [I.V.:1000] Out: -   BLOOD ADMINISTERED:none  DRAINS: none   LOCAL MEDICATIONS USED:  LIDOCAINE   SPECIMEN:  No Specimen  DISPOSITION OF SPECIMEN:  N/A  COUNTS:  YES  TOURNIQUET:  * No tourniquets in log *  DICTATION: Patient has a large L34 disc rupture on the right with significant right leg pain and weakness. It was elected to take him to surgery for right L 34 microdiscectomy.  Procedure: Patient was brought to the operating room and following the smooth and uncomplicated induction of general endotracheal anesthesia he was placed in a prone position on the Wilson frame. Low back was prepped and draped in the usual sterile fashion with betadine scrub and DuraPrep. Preoperative localizing X ray was obtained with a spinal needle. Area of planned incision was infiltrated with local lidocaine. Incision was made in the midline and carried to the lumbodorsal fascia which was incised on the right side of midline. Subperiosteal dissection was performed exposing what was felt to be L 34 level. Intraoperative x-ray demonstrated marker probe at L3-4.  A hemi-semi-laminectomy of L 3 was performed a high-speed drill and completed with Kerrison rongeurs and a generous foraminotomy was performed overlying the superior aspect of the L 4 lamina. Ligamentum  flavum was detached and removed in a piecemeal fashion and the L 4 nerve root was decompressed laterally with removal of the superior aspect of the facet and ligamentum causing nerve root compression. The microscope was brought into the field and the L 4 nerve root was mobilized medially. This exposed a subligamentous partially calcified disc herniation and multiple fragments of herniated disc material were removed.  The redundant annulus was also removed with 2 mm Kerrison rongeur. The interspace was not entered. At this point it was felt that all neural elements were well decompressed and there was no evidence of residual loose disc material. The wound was then irrigated with bacitracin saline and no additional disc material was mobilized. Hemostasis was assured with bipolar electrocautery and the interspace was irrigated with Depo-Medrol and fentanyl. The lumbodorsal fascia was closed with 0 Vicryl sutures the subcutaneous tissues reapproximated 2-0 Vicryl inverted sutures and the skin edges were reapproximated with 3-0 Vicryl subcuticular stitch. The wound is dressed with Dermabond and an occlusive dressing. Patient was extubated in the operating room and taken to recovery in stable and satisfactory condition having tolerated his operation well counts were correct at the end of the case.  PLAN OF CARE: Admit for overnight observation  PATIENT DISPOSITION:  PACU - hemodynamically stable.   Delay start of Pharmacological VTE agent (>24hrs) due to surgical blood loss or risk of bleeding: yes  

## 2014-05-01 NOTE — Plan of Care (Signed)
Problem: Consults Goal: Diagnosis - Spinal Surgery Outcome: Completed/Met Date Met:  05/01/14 Microdiscectomy

## 2014-05-01 NOTE — Progress Notes (Signed)
OT Cancellation Note  Patient Details Name: Arthur Smith MRN: [id number] DOB: June 13, 1951   Cancelled Treatment:    Reason Eval/Treat Not Completed: OT screened, no needs identified, will sign off  Earlie Raveling OTR/L [phone] 05/01/2014, 3:04 PM

## 2014-05-01 NOTE — H&P (Signed)
> [address] [place], [place] [address] Phone: [phone]   Patient ID:   [id number] Patient: Arthur Smith  Date of Birth: [birth year] Visit Type: Office Visit   Date: 03/07/2014 03:30 PM Provider: Danae Orleans. Venetia Maxon MD   This 63 year old male presents for Follow Up of back pain.  History of Present Illness: 1.  Follow Up of back pain  Pt returns to discuss MRI.  Lumbar pain persists, better controlled by Percocet 5/325 2 QID (than Norco).  Patient returns today to review his lumbar MRI.  This shows that he has a significant disc herniation at L3 L4 on the right and also to a lesser degree on the left.  He has significant stenosis at this level.  He has multilevel degenerative changes throughout the lumbar spine.  He has been taking Percocet 5/325-4 times daily for pain relief.  He is not able to function well and wants to get something done.  He is constrained by his wife's work schedule and she wants to wait until September as that is when she will be able have time off.  I do not believe that nonsurgical treatment is good option given the significant pain and sizable disc herniation.      Medical/Surgical/Interim History Reviewed, no change.  Last detailed document date:01/15/2014.   PAST MEDICAL HISTORY, SURGICAL HISTORY, FAMILY HISTORY, SOCIAL HISTORY AND REVIEW OF SYSTEMS I have reviewed the patient's past medical, surgical, family and social history as well as the comprehensive review of systems as included on the [facility] history form dated, which I have signed.  Family History: Reviewed, no changes.  Last detailed document: 01/15/2014.   Social History: Tobacco use reviewed. Reviewed, no changes. Last detailed document date: 01/15/2014.      MEDICATIONS(added, continued or stopped this visit):   Started Medication Directions Instruction Stopped   Flomax 0.4 mg capsule take 1 capsule by oral route  every day  1/2 hour following the same meal each day     gabapentin 100 mg capsule take 1 capsule by oral route 4 times every day     gabapentin 800 mg tablet take 1 tablet by oral route 4 times every day     imipramine 25 mg tablet take 6 tablet by oral route  every day     meloxicam 15 mg tablet take 1 tablet by oral route  every day    01/15/2014 Norco 5 mg-325 mg tablet take 1 tablet by oral route  every 6 hours as needed for pain    03/07/2014 Percocet 10 mg-325 mg tablet take 1 tablet by oral route  every 6 hours as needed    03/05/2014 Percocet 5 mg-325 mg tablet take 1-2 po q6hrs prn pain  03/07/2014   tramadol 50 mg tablet take 2 tablet by oral route 4 times every day as needed     zonisamide 100 mg capsule take 3 capsule by oral route  every day      ALLERGIES:  Ingredient Reaction Medication Name Comment  NO KNOWN ALLERGIES     No known allergies.   Vitals Date Temp F BP Pulse Ht In Wt Lb BMI BSA Pain Score  03/07/2014  127/76 54 68 222 33.75  0/10      DIAGNOSTIC RESULTS Diagnostic report text  CLINICAL DATA: Low back pain. History of lumbar radiculopathy.  EXAM: MRI LUMBAR SPINE WITHOUT AND WITH CONTRAST  TECHNIQUE: Multiplanar and multiecho pulse sequences of the lumbar spine  were obtained without and with intravenous contrast.  CONTRAST: 20mL MULTIHANCE GADOBENATE DIMEGLUMINE 529 MG/ML IV SOLN  COMPARISON: Plain films from ordering provider's office 01/15/2014.  FINDINGS: Mild straightening of the normal lumbar lordosis. Trace retrolisthesis at L2-3, L3-4, L4-5, and L5-S1 appears facet mediated. Multilevel disc space narrowing at L2-3, L3-4, L4-5, and L5-S1. Prominent endplate reactive changes most notable L3-4. Conus has normal size and signal but ends at the L2-3 disc space level. No filum terminale abnormalities are seen. No paravertebral masses. No SI joint abnormalities.  Postcontrast enhancement above and below the L3-4 disc space reflects active ongoing  degenerative change. There is also peridiscal enhancement of the extruded fragment in the canal, described below.  The individual disc spaces were examined with axial images as follows:  L1-L2: Unremarkable disc space. Extensive osseous spurring to the right without foraminal narrowing.  L2-L3: Advanced disc space narrowing. Mild annular bulging. Central osteophyte formation. Mild facet arthropathy and ligamentum flavum hypertrophy. Mild canal stenosis and lateral recess encroachment could affect either L3 nerve root. No appreciable foraminal narrowing.  L3-L4: Severe disc space narrowing with endplate reactive changes. There is a large disc extrusion in the canal, within an inferiorly migrated free fragment. Portions of this extruded fragment demonstrate peridiscal enhancement. There is moderately severe central canal stenosis which is worsened by superimposed posterior element hypertrophy. Due to the large size of this abnormality, it is difficult to exclude a superimposed component of hemorrhage) epidural hematoma), although I believe the majority of this abnormality represents disc material. A component of the disc extends into the right neural foramen. Bilateral right greater than left L4 nerve root impingement along with right L3 nerve root impingement are noted.  L4-L5: Severe disc space narrowing. Central protrusion with bilateral osseous reaction. Facet and ligamentum flavum hypertrophy. Moderate central canal stenosis. Bilateral foraminal narrowing. Either L5 nerve root could be affected.  L5-S1: Central protrusion. Central osteophyte formation, and posterior, hypertrophy combine to result in moderate to severe central canal stenosis. There is moderately advanced bilateral neural foraminal narrowing. Bilateral L5 and S1 nerve root impingement are likely.  IMPRESSION: The dominant abnormality is at L3-L4, where a large disc extrusion and a caudally migrated free  fragment combine with posterior element hypertrophy to result in moderately severe stenosis and right greater than left neural impingement. See discussion above.  Potentially symptomatic neural impingement is observed at L2-3, L4-5, and L5-S1 as detailed above.   Electronically Signed By: Davonna Belling M.D. On: 02/20/2014 19:43    IMPRESSION Lumbar radiculopathy right greater than left with L3 L4 disc herniation with significant stenosis and nerve root compression.  Completed Orders (this encounter) Order Details Reason Side Interpretation Result Initial Treatment Date Region  Lifestyle education regarding diet Encouraged to eat a well balanced diet and follow up with primary care physician.         Assessment/Plan # Detail Type Description   1. Assessment Lumbago (724.2).       2. Assessment Lumbar radiculopathy (724.4).       3. Assessment Peripheral neuropathy (356.9).       4. Assessment Herniated lumbar intervertebral disc (722.10).       5. Assessment BMI 33.0-33.9,ADULT (V85.33).   Plan Orders Today's instructions / counseling include(s) Lifestyle education regarding diet.         Pain Assessment/Treatment Pain Scale: 0/10. Method: Numeric Pain Intensity Scale. Onset: 10/15/2013.  I have recommended the patient undergo right and possibly left L3 L4 laminectomy and microdiscectomy.  Patient wishes to proceed  with surgery and we will planned on doing so in early September.  Risks and benefits were discussed in detail with the patient and patient teaching was also performed today.  Orders: Instruction(s)/Education: Assessment Instruction  V85.33 Lifestyle education regarding diet    MEDICATIONS PRESCRIBED TODAY    Rx Quantity Refills  PERCOCET 10 mg-325 mg  120 0            Provider:  Danae Orleans. Venetia Maxon MD  03/10/2014 03:18 PM Dictation edited by: Danae Orleans. Venetia Maxon    CC Providers: [facility] [address] [place],  [place]  [address]-   [facility] [address] [place], [place] [address]- ----------------------------------------------------------------------------------------------------------------------------------------------------------------------         Electronically signed by Danae Orleans Venetia Maxon MD on 03/10/2014 03:18 PM

## 2014-05-01 NOTE — Brief Op Note (Signed)
05/01/2014  9:22 AM  PATIENT:  Arthur Smith  63 y.o. male  PRE-OPERATIVE DIAGNOSIS:  Lumbar herniated nucleus pulposus without myelopathy L 34 Right, lumbar spondylosis, lumbar stenosis, lumbago, lumbar radiculopathy  POST-OPERATIVE DIAGNOSIS:   Lumbar herniated nucleus pulposus without myelopathy L 34 Right, lumbar spondylosis, lumbar stenosis, lumbago, lumbar radiculopathy   PROCEDURE:  Procedure(s): Right Lumbar three-four Microdiskectomy (Right) with microdissection  SURGEON:  Surgeon(s) and Role:    * Maeola Harman, MD - Primary  PHYSICIAN ASSISTANT:   ASSISTANTS: Poteat, RN   ANESTHESIA:   general  EBL:  Total I/O In: 1000 [I.V.:1000] Out: -   BLOOD ADMINISTERED:none  DRAINS: none   LOCAL MEDICATIONS USED:  LIDOCAINE   SPECIMEN:  No Specimen  DISPOSITION OF SPECIMEN:  N/A  COUNTS:  YES  TOURNIQUET:  * No tourniquets in log *  DICTATION: Patient has a large L34 disc rupture on the right with significant right leg pain and weakness. It was elected to take him to surgery for right L 34 microdiscectomy.  Procedure: Patient was brought to the operating room and following the smooth and uncomplicated induction of general endotracheal anesthesia he was placed in a prone position on the Wilson frame. Low back was prepped and draped in the usual sterile fashion with betadine scrub and DuraPrep. Preoperative localizing X ray was obtained with a spinal needle. Area of planned incision was infiltrated with local lidocaine. Incision was made in the midline and carried to the lumbodorsal fascia which was incised on the right side of midline. Subperiosteal dissection was performed exposing what was felt to be L 34 level. Intraoperative x-ray demonstrated marker probe at L3-4.  A hemi-semi-laminectomy of L 3 was performed a high-speed drill and completed with Kerrison rongeurs and a generous foraminotomy was performed overlying the superior aspect of the L 4 lamina. Ligamentum  flavum was detached and removed in a piecemeal fashion and the L 4 nerve root was decompressed laterally with removal of the superior aspect of the facet and ligamentum causing nerve root compression. The microscope was brought into the field and the L 4 nerve root was mobilized medially. This exposed a subligamentous partially calcified disc herniation and multiple fragments of herniated disc material were removed.  The redundant annulus was also removed with 2 mm Kerrison rongeur. The interspace was not entered. At this point it was felt that all neural elements were well decompressed and there was no evidence of residual loose disc material. The wound was then irrigated with bacitracin saline and no additional disc material was mobilized. Hemostasis was assured with bipolar electrocautery and the interspace was irrigated with Depo-Medrol and fentanyl. The lumbodorsal fascia was closed with 0 Vicryl sutures the subcutaneous tissues reapproximated 2-0 Vicryl inverted sutures and the skin edges were reapproximated with 3-0 Vicryl subcuticular stitch. The wound is dressed with Dermabond and an occlusive dressing. Patient was extubated in the operating room and taken to recovery in stable and satisfactory condition having tolerated his operation well counts were correct at the end of the case.  PLAN OF CARE: Admit for overnight observation  PATIENT DISPOSITION:  PACU - hemodynamically stable.   Delay start of Pharmacological VTE agent (>24hrs) due to surgical blood loss or risk of bleeding: yes

## 2014-05-01 NOTE — Anesthesia Postprocedure Evaluation (Signed)
  Anesthesia Post-op Note  Patient: Arthur Smith  Procedure(s) Performed: Procedure(s): Right Lumbar three-four Microdiskectomy (Right)  Patient Location: PACU  Anesthesia Type:General  Level of Consciousness: awake and alert   Airway and Oxygen Therapy: Patient Spontanous Breathing  Post-op Pain: none  Post-op Assessment: Post-op Vital signs reviewed, Patient's Cardiovascular Status Stable and Respiratory Function Stable  Post-op Vital Signs: Reviewed  Filed Vitals:   05/01/14 0945  BP:   Pulse: 72  Temp:   Resp: 14    Complications: No apparent anesthesia complications

## 2014-05-03 ENCOUNTER — Encounter (HOSPITAL_COMMUNITY): Payer: Self-pay | Admitting: Neurosurgery

## 2014-05-04 ENCOUNTER — Other Ambulatory Visit: Payer: Self-pay | Admitting: Neurology

## 2014-05-04 MED ORDER — GABAPENTIN 800 MG PO TABS: 800.0000 mg | ORAL_TABLET | Freq: Four times a day (QID) | ORAL | Status: DC

## 2014-05-04 MED ORDER — PREGABALIN 100 MG PO CAPS: 100.0000 mg | ORAL_CAPSULE | Freq: Two times a day (BID) | ORAL | Status: DC

## 2014-05-04 NOTE — Telephone Encounter (Signed)
Rx signed and faxed.

## 2014-05-07 ENCOUNTER — Ambulatory Visit: Payer: PRIVATE HEALTH INSURANCE | Admitting: Nurse Practitioner

## 2014-06-12 ENCOUNTER — Ambulatory Visit: Payer: No Typology Code available for payment source | Admitting: Dietician

## 2014-07-30 ENCOUNTER — Encounter: Payer: Self-pay | Admitting: Neurology

## 2014-07-30 ENCOUNTER — Ambulatory Visit (INDEPENDENT_AMBULATORY_CARE_PROVIDER_SITE_OTHER): Payer: PRIVATE HEALTH INSURANCE | Admitting: Neurology

## 2014-07-30 VITALS — BP 121/81 | HR 82 | Ht 70.0 in | Wt 222.6 lb

## 2014-07-30 DIAGNOSIS — G609 Hereditary and idiopathic neuropathy, unspecified: Secondary | ICD-10-CM

## 2014-07-30 NOTE — Patient Instructions (Signed)

## 2014-07-30 NOTE — Progress Notes (Signed)
Reason for visit: Peripheral neuropathy  Arthur FerronRoger A Sutherland is an 63 y.o. male  History of present illness:  Arthur Smith is a 63 year old right-handed white male with a peripheral neuropathy that is felt to be inherited, the patient's brother also has a neuropathy. The patient denies any significant balance issues. He denies any falls or stumbles. He also has chronic low back pain, with sciatica pain going down the right leg. He sees Dr. Venetia MaxonStern for this. He will be going for epidural steroid injections in the near future. The patient is on gabapentin and Lyrica combination, and this seems well for him. He indicates that he sleeps fairly well at night. He returns to this office for further evaluation.  Past Medical History  Diagnosis Date  . Recurrent upper respiratory infection (URI)     SOB CURRENT COLD  . Neuromuscular disorder     DIZZINESS   PERIPHERAL NEUROPATHY   . Arthritis   . Frequency of urination     URINATION AT NIGHT, PAINFUL SLOW STREAM   . No pertinent past medical history     RINGING RIGHT EAR  . Depression   . Shortness of breath     d/t "cold" 12/02/11  . Bronchitis with asthma, acute April 2013  . Hyperlipidemia   . GERD (gastroesophageal reflux disease)     occ  . Neuropathy     Past Surgical History  Procedure Laterality Date  . Hernia repair      UMBILICAL HERNIA 2011  . Finger surgery      RIGHT MIDDLE FINGER  . Multiple tooth extractions    . Lumbar laminectomy/decompression microdiscectomy  12/22/2011    Procedure: LUMBAR LAMINECTOMY/DECOMPRESSION MICRODISCECTOMY 1 LEVEL;  Surgeon: Maeola HarmanJoseph Stern, MD;  Location: [facility];  Service: Neurosurgery;  Laterality: N/A;  Thoracic ten-eleven laminectomy  . Lumbar laminectomy/decompression microdiscectomy Right 05/01/2014    Procedure: Right Lumbar three-four Microdiskectomy;  Surgeon: Maeola HarmanJoseph Stern, MD;  Location: [facility];  Service: Neurosurgery;  Laterality: Right;    Family History  Problem Relation  Age of Onset  . Cancer Mother   . Stroke Father   . Neuropathy Brother     Social history:  reports that he quit smoking about 40 years ago. His smoking use included Cigarettes. He has a 30 pack-year smoking history. He has never used smokeless tobacco. He reports that he drinks alcohol. He reports that he does not use illicit drugs.   No Known Allergies  Medications:  Current Outpatient Prescriptions on File Prior to Visit  Medication Sig Dispense Refill  . albuterol (PROVENTIL) (2.5 MG/3ML) 0.083% nebulizer solution Inhale 2.5 mg into the lungs every 6 (six) hours as needed for wheezing or shortness of breath.    Marland Kitchen. aspirin EC 81 MG tablet Take 162 mg by mouth daily.     Marland Kitchen. b complex vitamins tablet Take 1 tablet by mouth daily.    Marland Kitchen. gabapentin (NEURONTIN) 100 MG capsule TAKE 1 CAPSULE BY MOUTH 4 TIMES DAILY (TAKE WITH 800 MG TABLETS) 120 capsule 5  . gabapentin (NEURONTIN) 800 MG tablet Take 1 tablet (800 mg total) by mouth 4 (four) times daily. Take with 100mg  caps 120 tablet 5  . imipramine (TOFRANIL) 25 MG tablet TAKE 6 TABLETS BY MOUTH ONCE DAILY AT BEDTIME 180 tablet 5  . pregabalin (LYRICA) 100 MG capsule Take 1 capsule (100 mg total) by mouth 2 (two) times daily. 60 capsule 5  . senna-docusate (SENNA S) 8.6-50 MG per tablet Take  4 tablets by mouth at bedtime.     . tamsulosin (FLOMAX) 0.4 MG CAPS capsule Take 1 capsule (0.4 mg total) by mouth daily after supper.    . zonisamide (ZONEGRAN) 100 MG capsule TAKE 3 CAPSULES BY MOUTH ONCE DAILY AT BEDTIME. 90 capsule 5   No current facility-administered medications on file prior to visit.    ROS:  Out of a complete 14 system review of symptoms, the patient complains only of the following symptoms, and all other reviewed systems are negative.  Back pain, right leg pain  Blood pressure 121/81, pulse 82, height 5\' 10"  (1.778 m), weight 222 lb 9.6 oz (100.971 kg).  Physical Exam  General: The patient is alert and cooperative at the  time of the examination. The patient is minimally obese.  Skin: No significant peripheral edema is noted.   Neurologic Exam  Mental status: The patient is oriented x 3.  Cranial nerves: Facial symmetry is present. Speech is normal, no aphasia or dysarthria is noted. Extraocular movements are full. Visual fields are full.  Motor: The patient has good strength in all 4 extremities. The patient is able walk on heels and toes bilaterally.  Sensory examination: There is a stocking pattern pinprick sensory deficit up to the knees bilaterally. The patient has symmetric sensation in the arms and face.  Coordination: The patient has good finger-nose-finger and heel-to-shin bilaterally.  Gait and station: The patient has a normal gait. Tandem gait is normal. Romberg is negative. No drift is seen.  Reflexes: Deep tendon reflexes are symmetric, but are depressed.   Assessment/Plan:  1. Peripheral neuropathy  2. Chronic low back pain, right-sided sciatica  The patient is relatively stable with his neuropathy symptoms. He will continue his medications without change, and he will follow-up in 9 or 10 months. He will call our office if new issues arise.  Marlan Palau. Keith Kerney Hopfensperger MD 07/30/2014 8:52 PM  [facility] [address] [place], [place] [address]  Phone [phone] Fax [phone]

## 2014-08-06 ENCOUNTER — Other Ambulatory Visit: Payer: Self-pay

## 2014-08-06 MED ORDER — IMIPRAMINE HCL 25 MG PO TABS: ORAL_TABLET | ORAL | Status: DC

## 2014-08-06 NOTE — Telephone Encounter (Signed)
Pharmacy requests 90 days  

## 2014-08-08 ENCOUNTER — Ambulatory Visit: Payer: PRIVATE HEALTH INSURANCE | Admitting: Nurse Practitioner

## 2014-08-14 ENCOUNTER — Other Ambulatory Visit: Payer: Self-pay

## 2014-08-14 MED ORDER — GABAPENTIN 100 MG PO CAPS: ORAL_CAPSULE | ORAL | Status: DC

## 2014-08-14 MED ORDER — GABAPENTIN 800 MG PO TABS: 800.0000 mg | ORAL_TABLET | Freq: Four times a day (QID) | ORAL | Status: DC

## 2014-08-14 MED ORDER — ZONISAMIDE 100 MG PO CAPS: ORAL_CAPSULE | ORAL | Status: DC

## 2014-08-14 MED ORDER — PREGABALIN 100 MG PO CAPS: 100.0000 mg | ORAL_CAPSULE | Freq: Two times a day (BID) | ORAL | Status: DC

## 2014-08-14 NOTE — Telephone Encounter (Signed)
Rx signed and faxed.

## 2014-08-14 NOTE — Telephone Encounter (Signed)
Pharmacy requests 90 day Rx  

## 2014-08-22 ENCOUNTER — Telehealth: Payer: Self-pay | Admitting: Neurology

## 2014-08-22 NOTE — Telephone Encounter (Signed)
[facility] Reather LaurenceWilli[facility] to refill 90 day supply Rx for gabapentin (NEURONTIN) 800 MG tablet,  gabapentin (NEURONTIN) 100 MG capsule, and pregabalin (LYRICA) 100 MG capsule.  Please call and advise.

## 2014-08-22 NOTE — Telephone Encounter (Signed)
These Rx's were already sent for 90 day supplies on 12/29.  I called the pharmacy.  Spoke with Morrie SheldonAshley.  She verified they do have the 90 day Rx's, however they have been saved on file because it is too soon.  Ins will not pay for refills at this time.  I called the patient back.  Got no answer.  Left message relaying this info.

## 2014-10-10 ENCOUNTER — Other Ambulatory Visit: Payer: Self-pay | Admitting: Neurosurgery

## 2014-10-29 ENCOUNTER — Encounter (HOSPITAL_COMMUNITY): Payer: Self-pay

## 2014-10-29 ENCOUNTER — Encounter (HOSPITAL_COMMUNITY)
Admission: RE | Admit: 2014-10-29 | Discharge: 2014-10-29 | Disposition: A | Discharge status: Home / Self Care | Payer: No Typology Code available for payment source | Source: Ambulatory Visit | Attending: Neurosurgery | Admitting: Neurosurgery

## 2014-10-29 DIAGNOSIS — M4806 Spinal stenosis, lumbar region: Secondary | ICD-10-CM | POA: Insufficient documentation

## 2014-10-29 DIAGNOSIS — Z01812 Encounter for preprocedural laboratory examination: Secondary | ICD-10-CM | POA: Diagnosis not present

## 2014-10-29 LAB — BASIC METABOLIC PANEL
Anion gap: 9 (ref 5–15)
BUN: 9 mg/dL (ref 6–23)
CO2: 22 mmol/L (ref 19–32)
Calcium: 8.9 mg/dL (ref 8.4–10.5)
Chloride: 109 mmol/L (ref 96–112)
Creatinine, Ser: 1.02 mg/dL (ref 0.50–1.35)
GFR calc Af Amer: 88 mL/min — ABNORMAL LOW (ref >90)
GFR calc non Af Amer: 76 mL/min — ABNORMAL LOW (ref >90)
Glucose, Bld: 106 mg/dL — ABNORMAL HIGH (ref 70–99)
Potassium: 4 mmol/L (ref 3.5–5.1)
Sodium: 140 mmol/L (ref 135–145)

## 2014-10-29 LAB — TYPE AND SCREEN
ABO/RH(D): O NEG
Antibody Screen: NEGATIVE

## 2014-10-29 LAB — CBC
HCT: 41.8 % (ref 39.0–52.0)
Hemoglobin: 14.1 g/dL (ref 13.0–17.0)
MCH: 30.8 pg (ref 26.0–34.0)
MCHC: 33.7 g/dL (ref 30.0–36.0)
MCV: 91.3 fL (ref 78.0–100.0)
Platelets: 269 10*3/uL (ref 150–400)
RBC: 4.58 MIL/uL (ref 4.22–5.81)
RDW: 13.9 % (ref 11.5–15.5)
WBC: 7.1 10*3/uL (ref 4.0–10.5)

## 2014-10-29 LAB — ABO/RH: ABO/RH(D): O NEG

## 2014-10-29 LAB — SURGICAL PCR SCREEN
MRSA, PCR: POSITIVE — AB
Staphylococcus aureus: POSITIVE — AB

## 2014-10-29 NOTE — Pre-Procedure Instructions (Addendum)
Arthur FerronRoger A Mcclenney  10/29/2014   Your procedure is scheduled on:  11/08/14  [facility] at 100 PM.  Call this number if you have problems the morning of surgery: [phone]   Remember:   Do not eat food or drink liquids after midnight.   Take these medicines the morning of surgery with A SIP OF WATER: gabapentin, didlaudid if needed, lyrica    STOP all herbel meds, nsaids (aleve,naproxen,advil,ibuprofen) 5 days prior to surgery starting 11/03/14 including vitamins, aspirin   Do not wear jewelry, make-up or nail polish.  Do not wear lotions, powders, or perfumes. You may wear deodorant.  Do not shave 48 hours prior to surgery. Men may shave face and neck.  Do not bring valuables to the hospital.  [facility] is not responsible                  for any belongings or valuables.               Contacts, dentures or bridgework may not be worn into surgery.  Leave suitcase in the car. After surgery it may be brought to your room.  For patients admitted to the hospital, discharge time is determined by your                treatment team.               Patients discharged the day of surgery will not be allowed to drive  home.  Name and phone number of your driver:   Special Instructions:  Special Instructions: [place] - Preparing for Surgery  Before surgery, you can play an important role.  Because skin is not sterile, your skin needs to be as free of germs as possible.  You can reduce the number of germs on you skin by washing with CHG (chlorahexidine gluconate) soap before surgery.  CHG is an antiseptic cleaner which kills germs and bonds with the skin to continue killing germs even after washing.  Please DO NOT use if you have an allergy to CHG or antibacterial soaps.  If your skin becomes reddened/irritated stop using the CHG and inform your nurse when you arrive at Short Stay.  Do not shave (including legs and underarms) for at least 48 hours prior to the  first CHG shower.  You may shave your face.  Please follow these instructions carefully:   1.  Shower with CHG Soap the night before surgery and the morning of Surgery.  2.  If you choose to wash your hair, wash your hair first as usual with your normal shampoo.  3.  After you shampoo, rinse your hair and body thoroughly to remove the Shampoo.  4.  Use CHG as you would any other liquid soap.  You can apply chg directly  to the skin and wash gently with scrungie or a clean washcloth.  5.  Apply the CHG Soap to your body ONLY FROM THE NECK DOWN.  Do not use on open wounds or open sores.  Avoid contact with your eyes ears, mouth and genitals (private parts).  Wash genitals (private parts)       with your normal soap.  6.  Wash thoroughly, paying special attention to the area where your surgery will be performed.  7.  Thoroughly rinse your body with warm water from the neck down.  8.  DO NOT shower/wash with your normal soap after using and rinsing off the CHG Soap.  9.  Pat yourself dry with a clean towel.            10.  Wear clean pajamas.            11.  Place clean sheets on your bed the night of your first shower and do not sleep with pets.  Day of Surgery  Do not apply any lotions/deodorants the morning of surgery.  Please wear clean clothes to the hospital/surgery center.   Please read over the following fact sheets that you were given: Pain Booklet, Coughing and Deep Breathing, Blood Transfusion Information, MRSA Information and Surgical Site Infection Prevention

## 2014-11-07 ENCOUNTER — Other Ambulatory Visit: Payer: Self-pay | Admitting: Neurology

## 2014-11-07 MED ORDER — CEFAZOLIN SODIUM-DEXTROSE 2-3 GM-% IV SOLR
2.0000 g | INTRAVENOUS | Status: AC
  Administered 2014-11-08: 2 g via INTRAVENOUS
  Filled 2014-11-07: qty 50

## 2014-11-08 ENCOUNTER — Inpatient Hospital Stay (HOSPITAL_COMMUNITY)
Admission: RE | Admit: 2014-11-08 | Discharge: 2014-11-09 | DRG: 520 | Disposition: A | Discharge status: Home / Self Care | Payer: No Typology Code available for payment source | Source: Ambulatory Visit | Attending: Neurosurgery | Admitting: Neurosurgery

## 2014-11-08 ENCOUNTER — Ambulatory Visit (HOSPITAL_COMMUNITY): Payer: No Typology Code available for payment source

## 2014-11-08 ENCOUNTER — Encounter (HOSPITAL_COMMUNITY)
Admission: RE | Disposition: A | Discharge status: Home / Self Care | Payer: Self-pay | Source: Ambulatory Visit | Attending: Neurosurgery

## 2014-11-08 ENCOUNTER — Ambulatory Visit (HOSPITAL_COMMUNITY): Payer: No Typology Code available for payment source | Admitting: Certified Registered Nurse Anesthetist

## 2014-11-08 ENCOUNTER — Encounter (HOSPITAL_COMMUNITY): Payer: Self-pay | Admitting: Certified Registered Nurse Anesthetist

## 2014-11-08 DIAGNOSIS — M4807 Spinal stenosis, lumbosacral region: Secondary | ICD-10-CM | POA: Diagnosis present

## 2014-11-08 DIAGNOSIS — M5117 Intervertebral disc disorders with radiculopathy, lumbosacral region: Secondary | ICD-10-CM | POA: Diagnosis present

## 2014-11-08 DIAGNOSIS — Z79899 Other long term (current) drug therapy: Secondary | ICD-10-CM | POA: Diagnosis not present

## 2014-11-08 DIAGNOSIS — M4726 Other spondylosis with radiculopathy, lumbar region: Secondary | ICD-10-CM | POA: Diagnosis present

## 2014-11-08 DIAGNOSIS — Z419 Encounter for procedure for purposes other than remedying health state, unspecified: Secondary | ICD-10-CM

## 2014-11-08 DIAGNOSIS — M4716 Other spondylosis with myelopathy, lumbar region: Secondary | ICD-10-CM | POA: Diagnosis present

## 2014-11-08 DIAGNOSIS — M4727 Other spondylosis with radiculopathy, lumbosacral region: Secondary | ICD-10-CM | POA: Diagnosis present

## 2014-11-08 DIAGNOSIS — M48062 Spinal stenosis, lumbar region with neurogenic claudication: Secondary | ICD-10-CM | POA: Diagnosis present

## 2014-11-08 HISTORY — PX: LUMBAR LAMINECTOMY/DECOMPRESSION MICRODISCECTOMY: SHX5026

## 2014-11-08 SURGERY — LUMBAR LAMINECTOMY/DECOMPRESSION MICRODISCECTOMY 2 LEVELS
Anesthesia: General | Site: Back | Laterality: Right

## 2014-11-08 MED ORDER — PHENYLEPHRINE HCL 10 MG/ML IJ SOLN
10.0000 mg | INTRAMUSCULAR | Status: DC | PRN
  Administered 2014-11-08: 20 ug/min via INTRAVENOUS

## 2014-11-08 MED ORDER — ZOLPIDEM TARTRATE 5 MG PO TABS: 5.0000 mg | ORAL_TABLET | Freq: Every evening | ORAL | Status: DC | PRN

## 2014-11-08 MED ORDER — DOCUSATE SODIUM 100 MG PO CAPS
100.0000 mg | ORAL_CAPSULE | Freq: Two times a day (BID) | ORAL | Status: DC
  Administered 2014-11-08 – 2014-11-09 (×2): 100 mg via ORAL
  Filled 2014-11-08 (×2): qty 1

## 2014-11-08 MED ORDER — B COMPLEX PO TABS: 1.0000 | ORAL_TABLET | Freq: Every day | ORAL | Status: DC

## 2014-11-08 MED ORDER — B COMPLEX-C PO TABS
1.0000 | ORAL_TABLET | Freq: Every day | ORAL | Status: DC
  Administered 2014-11-09: 1 via ORAL
  Filled 2014-11-08: qty 1

## 2014-11-08 MED ORDER — GLYCOPYRROLATE 0.2 MG/ML IJ SOLN
INTRAMUSCULAR | Status: DC | PRN
  Administered 2014-11-08: .8 mg via INTRAVENOUS

## 2014-11-08 MED ORDER — ONDANSETRON HCL 4 MG/2ML IJ SOLN
INTRAMUSCULAR | Status: DC | PRN
  Administered 2014-11-08: 4 mg via INTRAVENOUS

## 2014-11-08 MED ORDER — ONDANSETRON HCL 4 MG/2ML IJ SOLN
INTRAMUSCULAR | Status: AC
  Filled 2014-11-08: qty 2

## 2014-11-08 MED ORDER — IMIPRAMINE HCL 25 MG PO TABS
25.0000 mg | ORAL_TABLET | Freq: Every day | ORAL | Status: DC
  Administered 2014-11-08: 25 mg via ORAL
  Filled 2014-11-08 (×2): qty 1

## 2014-11-08 MED ORDER — OXYCODONE HCL 5 MG PO TABS
5.0000 mg | ORAL_TABLET | Freq: Once | ORAL | Status: AC | PRN
  Administered 2014-11-08: 5 mg via ORAL

## 2014-11-08 MED ORDER — OXYCODONE-ACETAMINOPHEN 5-325 MG PO TABS
ORAL_TABLET | ORAL | Status: AC
  Administered 2014-11-08: 1 via ORAL
  Filled 2014-11-08: qty 1

## 2014-11-08 MED ORDER — PROMETHAZINE HCL 25 MG/ML IJ SOLN: 6.2500 mg | INTRAMUSCULAR | Status: DC | PRN

## 2014-11-08 MED ORDER — SUCCINYLCHOLINE CHLORIDE 20 MG/ML IJ SOLN
INTRAMUSCULAR | Status: AC
  Filled 2014-11-08: qty 1

## 2014-11-08 MED ORDER — LIDOCAINE HCL (CARDIAC) 20 MG/ML IV SOLN
INTRAVENOUS | Status: AC
Start: 2014-11-08 — End: 2014-11-08
  Filled 2014-11-08: qty 5

## 2014-11-08 MED ORDER — GABAPENTIN 300 MG PO CAPS
900.0000 mg | ORAL_CAPSULE | Freq: Three times a day (TID) | ORAL | Status: DC
  Administered 2014-11-08 – 2014-11-09 (×3): 900 mg via ORAL
  Filled 2014-11-08 (×3): qty 3

## 2014-11-08 MED ORDER — TAMSULOSIN HCL 0.4 MG PO CAPS
0.4000 mg | ORAL_CAPSULE | Freq: Every day | ORAL | Status: DC
  Administered 2014-11-08 – 2014-11-09 (×2): 0.4 mg via ORAL
  Filled 2014-11-08 (×2): qty 1

## 2014-11-08 MED ORDER — SENNOSIDES-DOCUSATE SODIUM 8.6-50 MG PO TABS
4.0000 | ORAL_TABLET | Freq: Every day | ORAL | Status: DC
  Administered 2014-11-08: 4 via ORAL
  Filled 2014-11-08: qty 4

## 2014-11-08 MED ORDER — LIDOCAINE HCL (CARDIAC) 20 MG/ML IV SOLN
INTRAVENOUS | Status: DC | PRN
  Administered 2014-11-08: 80 mg via INTRAVENOUS

## 2014-11-08 MED ORDER — PHENOL 1.4 % MT LIQD: 1.0000 | OROMUCOSAL | Status: DC | PRN

## 2014-11-08 MED ORDER — THROMBIN 5000 UNITS EX SOLR
CUTANEOUS | Status: DC | PRN
  Administered 2014-11-08 (×2): 5000 [IU] via TOPICAL

## 2014-11-08 MED ORDER — HYDROMORPHONE HCL 1 MG/ML IJ SOLN
INTRAMUSCULAR | Status: AC
  Administered 2014-11-08: 0.5 mg via INTRAVENOUS
  Filled 2014-11-08: qty 1

## 2014-11-08 MED ORDER — PROPOFOL 10 MG/ML IV BOLUS
INTRAVENOUS | Status: AC
  Filled 2014-11-08: qty 20

## 2014-11-08 MED ORDER — ATORVASTATIN CALCIUM 10 MG PO TABS
10.0000 mg | ORAL_TABLET | Freq: Every day | ORAL | Status: DC
  Administered 2014-11-08: 10 mg via ORAL
  Filled 2014-11-08: qty 1

## 2014-11-08 MED ORDER — ARTIFICIAL TEARS OP OINT
TOPICAL_OINTMENT | OPHTHALMIC | Status: AC
  Filled 2014-11-08: qty 3.5

## 2014-11-08 MED ORDER — METHOCARBAMOL 1000 MG/10ML IJ SOLN
500.0000 mg | Freq: Four times a day (QID) | INTRAVENOUS | Status: DC | PRN
  Filled 2014-11-08: qty 5

## 2014-11-08 MED ORDER — ALUM & MAG HYDROXIDE-SIMETH 200-200-20 MG/5ML PO SUSP: 30.0000 mL | Freq: Four times a day (QID) | ORAL | Status: DC | PRN

## 2014-11-08 MED ORDER — METHOCARBAMOL 500 MG PO TABS
500.0000 mg | ORAL_TABLET | Freq: Four times a day (QID) | ORAL | Status: DC | PRN
  Administered 2014-11-08 – 2014-11-09 (×2): 500 mg via ORAL
  Filled 2014-11-08 (×2): qty 1

## 2014-11-08 MED ORDER — PROPOFOL 10 MG/ML IV BOLUS
INTRAVENOUS | Status: DC | PRN
  Administered 2014-11-08: 200 mg via INTRAVENOUS

## 2014-11-08 MED ORDER — HEMOSTATIC AGENTS (NO CHARGE) OPTIME
TOPICAL | Status: DC | PRN
  Administered 2014-11-08: 1 via TOPICAL

## 2014-11-08 MED ORDER — LACTATED RINGERS IV SOLN
INTRAVENOUS | Status: DC | PRN
  Administered 2014-11-08 (×2): via INTRAVENOUS

## 2014-11-08 MED ORDER — GLYCOPYRROLATE 0.2 MG/ML IJ SOLN
INTRAMUSCULAR | Status: AC
  Filled 2014-11-08: qty 3

## 2014-11-08 MED ORDER — SODIUM CHLORIDE 0.9 % IJ SOLN: 3.0000 mL | INTRAMUSCULAR | Status: DC | PRN

## 2014-11-08 MED ORDER — SODIUM CHLORIDE 0.9 % IV SOLN: 250.0000 mL | INTRAVENOUS | Status: DC

## 2014-11-08 MED ORDER — PHENYLEPHRINE 40 MCG/ML (10ML) SYRINGE FOR IV PUSH (FOR BLOOD PRESSURE SUPPORT)
PREFILLED_SYRINGE | INTRAVENOUS | Status: AC
  Filled 2014-11-08: qty 10

## 2014-11-08 MED ORDER — HYDROMORPHONE HCL 1 MG/ML IJ SOLN
INTRAMUSCULAR | Status: AC
  Administered 2014-11-08: 1 mg via INTRAVENOUS
  Filled 2014-11-08: qty 1

## 2014-11-08 MED ORDER — MIDAZOLAM HCL 2 MG/2ML IJ SOLN
INTRAMUSCULAR | Status: AC
  Filled 2014-11-08: qty 2

## 2014-11-08 MED ORDER — ONDANSETRON HCL 4 MG/2ML IJ SOLN: 4.0000 mg | INTRAMUSCULAR | Status: DC | PRN

## 2014-11-08 MED ORDER — ARTIFICIAL TEARS OP OINT
TOPICAL_OINTMENT | OPHTHALMIC | Status: DC | PRN
  Administered 2014-11-08: 1 via OPHTHALMIC

## 2014-11-08 MED ORDER — BUPIVACAINE HCL (PF) 0.5 % IJ SOLN
INTRAMUSCULAR | Status: DC | PRN
  Administered 2014-11-08: 5 mL

## 2014-11-08 MED ORDER — LACTATED RINGERS IV SOLN
INTRAVENOUS | Status: DC
  Administered 2014-11-08: 14:00:00 via INTRAVENOUS

## 2014-11-08 MED ORDER — SODIUM CHLORIDE 0.9 % IJ SOLN
3.0000 mL | Freq: Two times a day (BID) | INTRAMUSCULAR | Status: DC
  Administered 2014-11-08: 3 mL via INTRAVENOUS

## 2014-11-08 MED ORDER — PHENYLEPHRINE HCL 10 MG/ML IJ SOLN
INTRAMUSCULAR | Status: DC | PRN
  Administered 2014-11-08 (×5): 80 ug via INTRAVENOUS

## 2014-11-08 MED ORDER — ACETAMINOPHEN 325 MG PO TABS: 650.0000 mg | ORAL_TABLET | ORAL | Status: DC | PRN

## 2014-11-08 MED ORDER — POLYETHYLENE GLYCOL 3350 17 G PO PACK: 17.0000 g | PACK | Freq: Every day | ORAL | Status: DC | PRN

## 2014-11-08 MED ORDER — HYDROMORPHONE HCL 1 MG/ML IJ SOLN: 0.5000 mg | INTRAMUSCULAR | Status: DC | PRN

## 2014-11-08 MED ORDER — FENTANYL CITRATE 0.05 MG/ML IJ SOLN
INTRAMUSCULAR | Status: AC
  Filled 2014-11-08: qty 2

## 2014-11-08 MED ORDER — ROCURONIUM BROMIDE 100 MG/10ML IV SOLN
INTRAVENOUS | Status: DC | PRN
  Administered 2014-11-08: 50 mg via INTRAVENOUS

## 2014-11-08 MED ORDER — PANTOPRAZOLE SODIUM 40 MG IV SOLR
40.0000 mg | Freq: Every day | INTRAVENOUS | Status: DC
  Administered 2014-11-08: 40 mg via INTRAVENOUS
  Filled 2014-11-08: qty 40

## 2014-11-08 MED ORDER — ZONISAMIDE 100 MG PO CAPS: 100.0000 mg | ORAL_CAPSULE | Freq: Every day | ORAL | Status: DC

## 2014-11-08 MED ORDER — NEOSTIGMINE METHYLSULFATE 10 MG/10ML IV SOLN
INTRAVENOUS | Status: AC
  Filled 2014-11-08: qty 4

## 2014-11-08 MED ORDER — NEOSTIGMINE METHYLSULFATE 10 MG/10ML IV SOLN
INTRAVENOUS | Status: DC | PRN
  Administered 2014-11-08: 5 mg via INTRAVENOUS

## 2014-11-08 MED ORDER — LIDOCAINE-EPINEPHRINE 1 %-1:100000 IJ SOLN
INTRAMUSCULAR | Status: DC | PRN
  Administered 2014-11-08: 5 mL

## 2014-11-08 MED ORDER — KCL IN DEXTROSE-NACL 20-5-0.45 MEQ/L-%-% IV SOLN
INTRAVENOUS | Status: DC
  Administered 2014-11-08: 23:00:00 via INTRAVENOUS
  Filled 2014-11-08: qty 1000

## 2014-11-08 MED ORDER — OXYCODONE HCL 5 MG PO TABS
ORAL_TABLET | ORAL | Status: AC
  Administered 2014-11-08: 5 mg via ORAL
  Filled 2014-11-08: qty 1

## 2014-11-08 MED ORDER — FENTANYL CITRATE 0.05 MG/ML IJ SOLN
INTRAMUSCULAR | Status: DC | PRN
Start: 2014-11-08 — End: 2014-11-08
  Administered 2014-11-08: 50 ug via INTRAVENOUS
  Administered 2014-11-08: 100 ug via INTRAVENOUS
  Administered 2014-11-08: 50 ug via INTRAVENOUS

## 2014-11-08 MED ORDER — ACETAMINOPHEN 650 MG RE SUPP: 650.0000 mg | RECTAL | Status: DC | PRN

## 2014-11-08 MED ORDER — METHOCARBAMOL 500 MG PO TABS
ORAL_TABLET | ORAL | Status: AC
  Administered 2014-11-08: 500 mg via ORAL
  Filled 2014-11-08: qty 1

## 2014-11-08 MED ORDER — ZONISAMIDE 100 MG PO CAPS
100.0000 mg | ORAL_CAPSULE | Freq: Every day | ORAL | Status: DC
  Administered 2014-11-08 – 2014-11-09 (×2): 100 mg via ORAL
  Filled 2014-11-08 (×2): qty 1

## 2014-11-08 MED ORDER — OXYCODONE HCL 5 MG/5ML PO SOLN: 5.0000 mg | Freq: Once | ORAL | Status: AC | PRN

## 2014-11-08 MED ORDER — MENTHOL 3 MG MT LOZG: 1.0000 | LOZENGE | OROMUCOSAL | Status: DC | PRN

## 2014-11-08 MED ORDER — BISACODYL 10 MG RE SUPP: 10.0000 mg | Freq: Every day | RECTAL | Status: DC | PRN

## 2014-11-08 MED ORDER — FENTANYL CITRATE 0.05 MG/ML IJ SOLN
INTRAMUSCULAR | Status: AC
  Filled 2014-11-08: qty 5

## 2014-11-08 MED ORDER — FENTANYL CITRATE 0.05 MG/ML IJ SOLN
INTRAMUSCULAR | Status: DC | PRN
  Administered 2014-11-08: 100 ug via INTRAVENOUS

## 2014-11-08 MED ORDER — GABAPENTIN 800 MG PO TABS: 800.0000 mg | ORAL_TABLET | Freq: Four times a day (QID) | ORAL | Status: DC

## 2014-11-08 MED ORDER — HYDROCODONE-ACETAMINOPHEN 5-325 MG PO TABS: 1.0000 | ORAL_TABLET | ORAL | Status: DC | PRN

## 2014-11-08 MED ORDER — CEFAZOLIN SODIUM 1-5 GM-% IV SOLN
1.0000 g | Freq: Three times a day (TID) | INTRAVENOUS | Status: AC
Start: 2014-11-09 — End: 2014-11-09
  Administered 2014-11-08 – 2014-11-09 (×2): 1 g via INTRAVENOUS
  Filled 2014-11-08 (×3): qty 50

## 2014-11-08 MED ORDER — HYDROMORPHONE HCL 1 MG/ML IJ SOLN
0.2500 mg | INTRAMUSCULAR | Status: DC | PRN
  Administered 2014-11-08 (×2): 0.5 mg via INTRAVENOUS
  Administered 2014-11-08: 1 mg via INTRAVENOUS

## 2014-11-08 MED ORDER — HYDROMORPHONE HCL 2 MG PO TABS: 2.0000 mg | ORAL_TABLET | Freq: Four times a day (QID) | ORAL | Status: DC | PRN

## 2014-11-08 MED ORDER — MIDAZOLAM HCL 5 MG/5ML IJ SOLN
INTRAMUSCULAR | Status: DC | PRN
  Administered 2014-11-08: 2 mg via INTRAVENOUS

## 2014-11-08 MED ORDER — OXYCODONE-ACETAMINOPHEN 5-325 MG PO TABS
1.0000 | ORAL_TABLET | ORAL | Status: DC | PRN
  Administered 2014-11-08: 1 via ORAL
  Administered 2014-11-09 (×2): 2 via ORAL
  Filled 2014-11-08 (×2): qty 2

## 2014-11-08 MED ORDER — ASPIRIN EC 81 MG PO TBEC
162.0000 mg | DELAYED_RELEASE_TABLET | Freq: Every day | ORAL | Status: DC
Start: 2014-11-09 — End: 2014-11-09
  Administered 2014-11-09: 162 mg via ORAL
  Filled 2014-11-08: qty 2

## 2014-11-08 MED ORDER — METHYLPREDNISOLONE ACETATE 80 MG/ML IJ SUSP
INTRAMUSCULAR | Status: DC | PRN
  Administered 2014-11-08: 80 mg

## 2014-11-08 MED ORDER — PREGABALIN 50 MG PO CAPS
100.0000 mg | ORAL_CAPSULE | Freq: Two times a day (BID) | ORAL | Status: DC
  Administered 2014-11-08 – 2014-11-09 (×2): 100 mg via ORAL
  Filled 2014-11-08 (×2): qty 2

## 2014-11-08 MED ORDER — FLEET ENEMA 7-19 GM/118ML RE ENEM: 1.0000 | ENEMA | Freq: Once | RECTAL | Status: AC | PRN

## 2014-11-08 SURGICAL SUPPLY — 55 items
BENZOIN TINCTURE PRP APPL 2/3 (GAUZE/BANDAGES/DRESSINGS) IMPLANT
BIT DRILL NEURO 2X3.1 SFT TUCH (MISCELLANEOUS) ×1 IMPLANT
BLADE CLIPPER SURG (BLADE) IMPLANT
BUR ROUND FLUTED 5 RND (BURR) ×2 IMPLANT
CANISTER SUCT 3000ML PPV (MISCELLANEOUS) ×2 IMPLANT
CONT SPEC 4OZ CLIKSEAL STRL BL (MISCELLANEOUS) ×2 IMPLANT
DECANTER SPIKE VIAL GLASS SM (MISCELLANEOUS) ×2 IMPLANT
DRAPE LAPAROTOMY 100X72X124 (DRAPES) ×2 IMPLANT
DRAPE MICROSCOPE LEICA (MISCELLANEOUS) ×2 IMPLANT
DRAPE POUCH INSTRU U-SHP 10X18 (DRAPES) ×2 IMPLANT
DRAPE SURG 17X23 STRL (DRAPES) ×2 IMPLANT
DRILL NEURO 2X3.1 SOFT TOUCH (MISCELLANEOUS) ×2
DRSG OPSITE POSTOP 4X6 (GAUZE/BANDAGES/DRESSINGS) ×2 IMPLANT
DRSG TELFA 3X8 NADH (GAUZE/BANDAGES/DRESSINGS) IMPLANT
DURAPREP 26ML APPLICATOR (WOUND CARE) ×2 IMPLANT
ELECT REM PT RETURN 9FT ADLT (ELECTROSURGICAL) ×2
ELECTRODE REM PT RTRN 9FT ADLT (ELECTROSURGICAL) ×1 IMPLANT
GAUZE SPONGE 4X4 12PLY STRL (GAUZE/BANDAGES/DRESSINGS) IMPLANT
GAUZE SPONGE 4X4 16PLY XRAY LF (GAUZE/BANDAGES/DRESSINGS) IMPLANT
GLOVE BIO SURGEON STRL SZ8 (GLOVE) ×2 IMPLANT
GLOVE BIOGEL PI IND STRL 8 (GLOVE) ×1 IMPLANT
GLOVE BIOGEL PI IND STRL 8.5 (GLOVE) ×1 IMPLANT
GLOVE BIOGEL PI INDICATOR 8 (GLOVE) ×1
GLOVE BIOGEL PI INDICATOR 8.5 (GLOVE) ×1
GLOVE ECLIPSE 8.0 STRL XLNG CF (GLOVE) ×2 IMPLANT
GLOVE EXAM NITRILE LRG STRL (GLOVE) IMPLANT
GLOVE EXAM NITRILE MD LF STRL (GLOVE) IMPLANT
GLOVE EXAM NITRILE XL STR (GLOVE) IMPLANT
GLOVE EXAM NITRILE XS STR PU (GLOVE) IMPLANT
GOWN STRL REUS W/ TWL LRG LVL3 (GOWN DISPOSABLE) IMPLANT
GOWN STRL REUS W/ TWL XL LVL3 (GOWN DISPOSABLE) ×1 IMPLANT
GOWN STRL REUS W/TWL 2XL LVL3 (GOWN DISPOSABLE) ×2 IMPLANT
GOWN STRL REUS W/TWL LRG LVL3 (GOWN DISPOSABLE)
GOWN STRL REUS W/TWL XL LVL3 (GOWN DISPOSABLE) ×1
KIT BASIN OR (CUSTOM PROCEDURE TRAY) ×2 IMPLANT
KIT ROOM TURNOVER OR (KITS) ×2 IMPLANT
LIQUID BAND (GAUZE/BANDAGES/DRESSINGS) ×2 IMPLANT
NEEDLE HYPO 18GX1.5 BLUNT FILL (NEEDLE) IMPLANT
NEEDLE HYPO 25X1 1.5 SAFETY (NEEDLE) ×2 IMPLANT
NS IRRIG 1000ML POUR BTL (IV SOLUTION) ×2 IMPLANT
PACK LAMINECTOMY NEURO (CUSTOM PROCEDURE TRAY) ×2 IMPLANT
PAD ARMBOARD 7.5X6 YLW CONV (MISCELLANEOUS) ×6 IMPLANT
RUBBERBAND STERILE (MISCELLANEOUS) ×4 IMPLANT
SPONGE SURGIFOAM ABS GEL SZ50 (HEMOSTASIS) ×2 IMPLANT
STAPLER SKIN PROX WIDE 3.9 (STAPLE) IMPLANT
STRIP CLOSURE SKIN 1/2X4 (GAUZE/BANDAGES/DRESSINGS) IMPLANT
SUT VIC AB 0 CT1 18XCR BRD8 (SUTURE) ×1 IMPLANT
SUT VIC AB 0 CT1 8-18 (SUTURE) ×1
SUT VIC AB 2-0 CT1 18 (SUTURE) ×2 IMPLANT
SUT VIC AB 3-0 SH 8-18 (SUTURE) ×2 IMPLANT
SYR 20ML ECCENTRIC (SYRINGE) ×2 IMPLANT
SYR 5ML LL (SYRINGE) IMPLANT
TOWEL OR 17X24 6PK STRL BLUE (TOWEL DISPOSABLE) ×2 IMPLANT
TOWEL OR 17X26 10 PK STRL BLUE (TOWEL DISPOSABLE) ×2 IMPLANT
WATER STERILE IRR 1000ML POUR (IV SOLUTION) ×2 IMPLANT

## 2014-11-08 NOTE — Anesthesia Procedure Notes (Signed)
Procedure Name: Intubation Date/Time: 11/08/2014 3:49 PM Performed by: Rise PatienceBELL, Undrea Archbold T Pre-anesthesia Checklist: Patient identified, Emergency Drugs available, Suction available and Patient being monitored Patient Re-evaluated:Patient Re-evaluated prior to inductionOxygen Delivery Method: Circle system utilized Preoxygenation: Pre-oxygenation with 100% oxygen Intubation Type: IV induction Ventilation: Mask ventilation without difficulty and Oral airway inserted - appropriate to patient size Laryngoscope Size: Hyacinth MeekerMiller and 2 Grade View: Grade I Tube type: Oral Tube size: 7.5 mm Number of attempts: 1 Airway Equipment and Method: Stylet and Oral airway Placement Confirmation: ETT inserted through vocal cords under direct vision,  positive ETCO2 and breath sounds checked- equal and bilateral Secured at: 22 cm Tube secured with: Tape Dental Injury: Teeth and Oropharynx as per pre-operative assessment

## 2014-11-08 NOTE — Progress Notes (Signed)
Awake, alert, conversant.  Full strength both legs PF/DF/EHL. Doing well. 

## 2014-11-08 NOTE — Transfer of Care (Signed)
Immediate Anesthesia Transfer of Care Note  Patient: Arthur Smith  Procedure(s) Performed: Procedure(s) with comments: Right L4-5 L5-S1 Laminectomy (Right) - Right L4-5 L5-S1 Laminectomy  Patient Location: PACU  Anesthesia Type:General  Level of Consciousness: awake, alert  and oriented  Airway & Oxygen Therapy: Patient Spontanous Breathing and Patient connected to nasal cannula oxygen  Post-op Assessment: Report given to RN, Post -op Vital signs reviewed and stable and Patient moving all extremities X 4  Post vital signs: Reviewed and stable  Last Vitals:  Filed Vitals:   11/08/14 1301  BP: 129/89  Pulse: 89  Temp: 36.4 C  Resp: 20    Complications: No apparent anesthesia complications

## 2014-11-08 NOTE — Op Note (Signed)
11/08/2014  5:30 PM  PATIENT:  Rosalva Ferronoger A Amenta  64 y.o. male  PRE-OPERATIVE DIAGNOSIS:  Spinal stenosis of lumbar region; Degenerative disc disease, Lumbosacral; Radiculopathy, Lumbar region L 45 and L 5 S 1 levels  POST-OPERATIVE DIAGNOSIS:  Spinal stenosis of lumbar region; Degenerative disc disease, Lumbosacral; Radiculopathy, Lumbar region L 45 and L 5 S 1 levels   PROCEDURE:  Procedure(s) with comments: Right L4-5 L5-S1 Laminectomy (Right) - Right L4-5 L5-S1 Laminectomy with microdissection  SURGEON:  Surgeon(s) and Role:    * Maeola HarmanJoseph Fredrik Mogel, MD - Primary    * Lisbeth RenshawNeelesh Nundkumar, MD - Assisting  PHYSICIAN ASSISTANT:   ASSISTANTS: Poteat, RN   ANESTHESIA:   general  EBL:  Total I/O In: 1300 [I.V.:1300] Out: 50 [Blood:50]  BLOOD ADMINISTERED:none  DRAINS: none   LOCAL MEDICATIONS USED:  LIDOCAINE   SPECIMEN:  No Specimen  DISPOSITION OF SPECIMEN:  N/A  COUNTS:  YES  TOURNIQUET:  * No tourniquets in log *  DICTATION: Patient has severe spinal stenosis at L4-5 and L 5 S1 on the right with significant right leg pain. It was elected to take him to surgery for right L 45 and right L 5 S 1 laminectomies.  Procedure: Patient was brought to the operating room and following the smooth and uncomplicated induction of general endotracheal anesthesia he was placed in a prone position on the Wilson frame. Low back was prepped and draped in the usual sterile fashion with betadine scrub and DuraPrep. Area of planned incision was infiltrated with local lidocaine. Incision was made in the midline and carried to the lumbodorsal fascia which was incised on the right side of midline. Subperiosteal dissection was performed exposing what was felt to be L45 and L 5 S 1 levels. Intraoperative x-ray demonstrated marker probes at L 45 and L 5 S 1. . A right  Laminectomy/foraminotomy of L4 and L5 was performed with leksell, then a high-speed drill and completed with Kerrison rongeurs and generous  foraminotomies were performed to decompress the L4, L5, and S1 nerves bilaterally. Ligamentum flavum was detached and removed in a piecemeal fashion and the right  L4 nerve root was decompressed laterally with removal of the superior aspect of the facet and ligamentum causing nerve root compression, as was the right L 5 nerve root. Angled curettes were used to detatch and then remove thickened compressive ligamentous material.  This was done with the operating microscope, using microdissection technique.  At this point it was felt that all neural elements were well decompressed. The wound was then irrigated with bacitracin saline. Hemostasis was assured with bipolar electrocautery and the interspace was irrigated with Depo-Medrol and fentanyl. The lumbodorsal fascia was closed with 0 Vicryl sutures the subcutaneous tissues reapproximated 2-0 Vicryl inverted sutures and the skin edges were reapproximated with 3-0 Vicryl subcuticular stitch. The wound was dressed with Dermabond and an occlusive dressing. Patient was extubated in the operating room and taken to recovery in stable and satisfactory condition having tolerated his operation well counts were correct at the end of the case.    PLAN OF CARE: Admit to inpatient   PATIENT DISPOSITION:  PACU - hemodynamically stable.   Delay start of Pharmacological VTE agent (>24hrs) due to surgical blood loss or risk of bleeding: yes

## 2014-11-08 NOTE — Anesthesia Preprocedure Evaluation (Addendum)
Anesthesia Evaluation  Patient identified by MRN, date of birth, ID band Patient awake    Reviewed: Allergy & Precautions, NPO status , Patient's Chart, lab work & pertinent test results  History of Anesthesia Complications Negative for: history of anesthetic complications  Airway Mallampati: II  TM Distance: >3 FB Neck ROM: Full    Dental  (+) Dental Advisory Given, Edentulous Upper, Edentulous Lower   Pulmonary asthma , Recent URI , former smoker,          Cardiovascular negative cardio ROS      Neuro/Psych PSYCHIATRIC DISORDERS Depression    GI/Hepatic Neg liver ROS, GERD-  Medicated,  Endo/Other  negative endocrine ROSMorbid obesity  Renal/GU negative Renal ROS     Musculoskeletal  (+) Arthritis -,   Abdominal   Peds  Hematology   Anesthesia Other Findings   Reproductive/Obstetrics                           Anesthesia Physical Anesthesia Plan  ASA: II  Anesthesia Plan: General   Post-op Pain Management:    Induction: Intravenous  Airway Management Planned: Oral ETT  Additional Equipment:   Intra-op Plan:   Post-operative Plan: Extubation in OR  Informed Consent: I have reviewed the patients History and Physical, chart, labs and discussed the procedure including the risks, benefits and alternatives for the proposed anesthesia with the patient or authorized representative who has indicated his/her understanding and acceptance.   Dental advisory given  Plan Discussed with: CRNA, Anesthesiologist and Surgeon  Anesthesia Plan Comments:        Anesthesia Quick Evaluation

## 2014-11-08 NOTE — Brief Op Note (Signed)
11/08/2014  5:30 PM  PATIENT:  Arthur Smith  63 y.o. male  PRE-OPERATIVE DIAGNOSIS:  Spinal stenosis of lumbar region; Degenerative disc disease, Lumbosacral; Radiculopathy, Lumbar region L 45 and L 5 S 1 levels  POST-OPERATIVE DIAGNOSIS:  Spinal stenosis of lumbar region; Degenerative disc disease, Lumbosacral; Radiculopathy, Lumbar region L 45 and L 5 S 1 levels   PROCEDURE:  Procedure(s) with comments: Right L4-5 L5-S1 Laminectomy (Right) - Right L4-5 L5-S1 Laminectomy with microdissection  SURGEON:  Surgeon(s) and Role:    * Samil Mecham, MD - Primary    * Neelesh Nundkumar, MD - Assisting  PHYSICIAN ASSISTANT:   ASSISTANTS: Poteat, RN   ANESTHESIA:   general  EBL:  Total I/O In: 1300 [I.V.:1300] Out: 50 [Blood:50]  BLOOD ADMINISTERED:none  DRAINS: none   LOCAL MEDICATIONS USED:  LIDOCAINE   SPECIMEN:  No Specimen  DISPOSITION OF SPECIMEN:  N/A  COUNTS:  YES  TOURNIQUET:  * No tourniquets in log *  DICTATION: Patient has severe spinal stenosis at L4-5 and L 5 S1 on the right with significant right leg pain. It was elected to take him to surgery for right L 45 and right L 5 S 1 laminectomies.  Procedure: Patient was brought to the operating room and following the smooth and uncomplicated induction of general endotracheal anesthesia he was placed in a prone position on the Wilson frame. Low back was prepped and draped in the usual sterile fashion with betadine scrub and DuraPrep. Area of planned incision was infiltrated with local lidocaine. Incision was made in the midline and carried to the lumbodorsal fascia which was incised on the right side of midline. Subperiosteal dissection was performed exposing what was felt to be L45 and L 5 S 1 levels. Intraoperative x-ray demonstrated marker probes at L 45 and L 5 S 1. . A right  Laminectomy/foraminotomy of L4 and L5 was performed with leksell, then a high-speed drill and completed with Kerrison rongeurs and generous  foraminotomies were performed to decompress the L4, L5, and S1 nerves bilaterally. Ligamentum flavum was detached and removed in a piecemeal fashion and the right  L4 nerve root was decompressed laterally with removal of the superior aspect of the facet and ligamentum causing nerve root compression, as was the right L 5 nerve root. Angled curettes were used to detatch and then remove thickened compressive ligamentous material.  This was done with the operating microscope, using microdissection technique.  At this point it was felt that all neural elements were well decompressed. The wound was then irrigated with bacitracin saline. Hemostasis was assured with bipolar electrocautery and the interspace was irrigated with Depo-Medrol and fentanyl. The lumbodorsal fascia was closed with 0 Vicryl sutures the subcutaneous tissues reapproximated 2-0 Vicryl inverted sutures and the skin edges were reapproximated with 3-0 Vicryl subcuticular stitch. The wound was dressed with Dermabond and an occlusive dressing. Patient was extubated in the operating room and taken to recovery in stable and satisfactory condition having tolerated his operation well counts were correct at the end of the case.    PLAN OF CARE: Admit to inpatient   PATIENT DISPOSITION:  PACU - hemodynamically stable.   Delay start of Pharmacological VTE agent (>24hrs) due to surgical blood loss or risk of bleeding: yes  

## 2014-11-08 NOTE — Interval H&P Note (Signed)
History and Physical Interval Note:  11/08/2014 7:16 AM  Arthur Smith  has presented today for surgery, with the diagnosis of Spinal stenosis of lumbar region; Degenerative disc disease, Lumbosacral; Radiculopathy, Lumbar region  The various methods of treatment have been discussed with the patient and family. After consideration of risks, benefits and other options for treatment, the patient has consented to  Procedure(s) with comments: Right L4-5 L5-S1 Laminectomy (Right) - Right L4-5 L5-S1 Laminectomy as a surgical intervention .  The patient's history has been reviewed, patient examined, no change in status, stable for surgery.  I have reviewed the patient's chart and labs.  Questions were answered to the patient's satisfaction.     Skylinn Vialpando D

## 2014-11-08 NOTE — H&P (Signed)
Patient ID:   [id number] Patient: Arthur Smith  Date of Birth: 08/31/1950 Visit Type: Office Visit   Date: 10/10/2014 11:15 AM Provider: Danae OrleansJoseph D. Venetia MaxonStern MD   This 64 year old male presents for back pain.  History of Present Illness: 1.  back pain  08/23/14 L5-S1 and TF ESI 09/20/14 L5-S1 right TF ESI and S1  Patient notes injection relief only lasted 2-3 days.  Patient continues to complain of significant discomfort into his right leg.  He denies left leg symptoms.  I reviewed his imaging studies which show significant foraminal stenosis at L4 L5 and L5-S1 with right greater than left.  Because of the severity of his pain and failure to improve with injection therapy, I recommended proceeding with a right L4 L5 and right L5-S1 laminal foraminotomies for spinal stenosis and lateral recess stenosis.  I reviewed the imaging studies with the patient and his wife.  They are comfortable with this recommendation and wished to proceed.  Therefore pull that we will be able to proceed with a sooner surgical date but currently the first available surgical date is 11/08/14.      Medical/Surgical/Interim History Reviewed, no change.  Last detailed document date:01/15/2014.   PAST MEDICAL HISTORY, SURGICAL HISTORY, FAMILY HISTORY, SOCIAL HISTORY AND REVIEW OF SYSTEMS I have reviewed the patient's past medical, surgical, family and social history as well as the comprehensive review of systems as included on the [facility] history form dated 08/23/2014, which I have signed.  Family History: Reviewed, no changes.  Last detailed document: 01/15/2014.   Social History: Tobacco use reviewed. Reviewed, no changes. Last detailed document date: 01/15/2014.      MEDICATIONS(added, continued or stopped this visit): Started Medication Directions Instruction Stopped  09/17/2014 Dilaudid 2 mg tablet take 1-2 po q4-6hrs prn pain  10/10/2014  10/10/2014 Dilaudid 2 mg tablet  take 1-2 po q4-6hrs prn pain     Flomax 0.4 mg capsule take 1 capsule by oral route  every day 1/2 hour following the same meal each day     gabapentin 100 mg capsule take 1 capsule by oral route 4 times every day     gabapentin 800 mg tablet take 1 tablet by oral route 4 times every day     imipramine 25 mg tablet take 6 tablet by oral route  every day     meloxicam 15 mg tablet take 1 tablet by oral route  every day    05/30/2014 tizanidine 2 mg tablet take 1 po q6hrs prn spasm     tramadol 50 mg tablet take 2 tablet by oral route 4 times every day as needed     zonisamide 100 mg capsule take 3 capsule by oral route  every day       ALLERGIES: Ingredient Reaction Medication Name Comment  NO KNOWN ALLERGIES     No known allergies.    Vitals Date Temp F BP Pulse Ht In Wt Lb BMI BSA Pain Score  10/10/2014  139/85 86 68 224 34.06  5/10      IMPRESSION Patient is not had significant lasting relief with injections and wishes to proceed with surgery.  This will consist of right L4-L5 and right L5-S1 lamino foraminotomies for stenosis.  Completed Orders (this encounter) Order Details Reason Side Interpretation Result Initial Treatment Date Region  Lifestyle education regarding diet Encouraged to eat a well balanced diet and follow up with primary care physician.  Assessment/Plan # Detail Type Description   1. Assessment Displacement of lumbosacral intervertebral disc (M51.27).       2. Assessment DDD (degenerative disc disease), lumbosacral (M51.37).       3. Assessment Radiculopathy, lumbar region (M54.16).       4. Assessment Spinal stenosis of lumbar region (M48.06).       5. Assessment Body mass index (BMI) 34.0-34.9, adult (Z68.34).   Plan Orders Today's instructions / counseling include(s) Lifestyle education regarding diet.         Pain Assessment/Treatment Pain Scale: 5/10. Method: Numeric Pain Intensity Scale. Location: back. Onset:  10/15/2013. Duration: varies. Quality: discomforting. Pain Assessment/Treatment follow-up plan of care: Patient is taking medications as prescribed..  Right L4-L5 and right L5-S1 lamino foraminotomies for stenosis.  Risks benefits were discussed in detail with the patient and his wife and they wished to proceed.  Orders: Diagnostic Procedures: Assessment Procedure  M48.06 Laminectomy and Foraminotomy - right - L4-L5 - L5-S1  Instruction(s)/Education: Assessment Instruction  Z68.34 Lifestyle education regarding diet    MEDICATIONS PRESCRIBED TODAY    Rx Quantity Refills  DILAUDID 2 mg  60 0            Provider:  Danae Orleans. Venetia Maxon MD  10/13/2014 04:03 PM Dictation edited by: Danae Orleans. Venetia Maxon    CC Providers: [facility] [address] [place],  [place]  [address]-   [facility] [address] [place], [place] [address]-              Electronically signed by Danae Orleans Venetia Maxon MD on 10/13/2014 04:03 PM  > [address] [place], [place] [address] Phone: [phone]   Patient ID:   [id number] Patient: Arthur Smith  Date of Birth: [birth year] Visit Type: Office Visit   Date: 07/23/2014 11:00 AM Provider: Danae Orleans. Venetia Maxon MD   This 64 year old male presents for Follow Up of back pain.  History of Present Illness: 1.  Follow Up of back pain  I reviewed the patient's MRI which shows excellent decompression at L3 L4 but the patient continues to have significant lumbar stenosis and spondylosis of L4 L5 levels.  He does have narrowing affecting the right L5 and right S1 nerve roots.  I suggested that we try blocks at these levels to see whether this will give him some relief of his discomfort.  I do not see any evidence of competition related to his recent surgery.  In fact where there was severely compressed nerves at the L3 L4 level this is much improved.       Medical/Surgical/Interim History Reviewed, no change.  Last detailed document date:01/15/2014.   PAST MEDICAL HISTORY, SURGICAL HISTORY, FAMILY HISTORY, SOCIAL HISTORY AND REVIEW OF SYSTEMS I have reviewed the patient's past medical, surgical, family and social history as well as the comprehensive review of systems as included on the [facility] history form dated, which I have signed.  Family History: Reviewed, no changes.  Last detailed document: 01/15/2014.   Social History: Tobacco use reviewed. Reviewed, no changes. Last detailed document date: 01/15/2014.      MEDICATIONS(added, continued or stopped this visit):   Started Medication Directions Instruction Stopped  07/16/2014 Dilaudid 2 mg tablet take 1-2 po q4-6hrs prn pain  07/23/2014  07/23/2014 Dilaudid 2 mg tablet take 1-2 po q4-6hrs prn pain     Flomax 0.4 mg capsule take 1 capsule  by oral route  every day 1/2 hour following the same meal each day     gabapentin 100 mg capsule take 1 capsule by oral route 4 times every day     gabapentin 800 mg tablet take 1 tablet by oral route 4 times every day     imipramine 25 mg tablet take 6 tablet by oral route  every day     meloxicam 15 mg tablet take 1 tablet by oral route  every day    05/30/2014 tizanidine 2 mg tablet take 1 po q6hrs prn spasm     tramadol 50 mg tablet take 2 tablet by oral route 4 times every day as needed     zonisamide 100 mg capsule take 3 capsule by oral route  every day      ALLERGIES:  Ingredient Reaction Medication Name Comment  NO KNOWN ALLERGIES     No known allergies.    Vitals Date Temp F BP Pulse Ht In Wt Lb BMI BSA Pain Score  07/23/2014  123/78 79 68 222 33.75  8/10      DIAGNOSTIC RESULTS Diagnostic report text  CLINICAL DATA: Low back pain radiating into the right hip and posterior right leg and ankle with numbness and tingling. Symptoms for 8 months. The patient is status post right L3-4  microdiscectomy and micro dissection 05/01/2014.  EXAM: MRI LUMBAR SPINE WITHOUT AND WITH CONTRAST  TECHNIQUE: Multiplanar and multiecho pulse sequences of the lumbar spine were obtained without and with intravenous contrast.  CONTRAST: 20 cc MultiHance IV.  COMPARISON: MR lumbar spine 02/20/2014.  FINDINGS: Vertebral body height is maintained. Scattered degenerative endplate signal change is most notable at L3-4. The conus medullaris is normal in signal and position. Imaged intra-abdominal contents appear normal.  The T11-12 and T12-L1 levels are imaged in the sagittal plane only and negative.  L1-2: Negative.  L2-3: Disc bulge and endplate spur are again seen. Mild ligamentum flavum thickening is identified. Mild to moderate central canal and lateral recess narrowing appears unchanged. The foramina are open.  L3-4: The patient is status post right laminotomy and discectomy. Small amount of residual disc bulging is seen in a far left paracentral position. The thecal sac appears decompressed. A small portion of the superior aspect of the right facet has been removed with improved appearance of encroachment on the exiting right L3 root. The left foramen is open.  L4-5: Shallow disc bulge ligamentum flavum thickening causing mild to moderate central canal narrowing is unchanged. Mild bilateral foraminal narrowing is also stable in appearance.  L5-S1: Disc bulge and endplate spur again seen. There is moderate to moderately severe central canal stenosis with narrowing of both lateral recesses which could impact either descending S1 root. Mild to moderate foraminal narrowing is identified. There is facet degenerative disease. The appearance of this level is unchanged.  IMPRESSION: No new abnormality since the prior examination. Status post right laminotomy and discectomy at L3-4. The appearance of this level is markedly improved with the thecal sac appearing decompressed  and right foraminal narrowing decreased.  No change in moderate to moderately severe central canal and bilateral lateral recess stenosis at L5-S1. Moderate bilateral foraminal narrowing at this level is unchanged.  No change in moderate central canal narrowing L2-3.  No change in mild to moderate central canal narrowing L4-5.   Electronically Signed By: Drusilla Kanner M.D. On: 07/19/2014 15:35    IMPRESSION We're going to go ahead with right L5 and S1 nerve blocks to see  if this will give relief of his complaints of pain.  This will be done with Dr. Ollen Bowl.  Patient will follow up with me after he has had injections.  Completed Orders (this encounter) Order Details Reason Side Interpretation Result Initial Treatment Date Region  Lifestyle education regarding diet Encouraged to eat a well balanced diet and follow up with primary care physician.         Assessment/Plan # Detail Type Description   1. Assessment Acquired polyneuropathy (G62.9).       2. Assessment Low back pain, unspecified back pain laterality, with sciatica presence unspecified (M54.5).       3. Assessment Radiculopathy, lumbar region (M54.16).       4. Assessment Arthropathy of hip (M12.9).       5. Assessment Body mass index (BMI) 33.0-33.9, adult (E95.28).   Plan Orders Today's instructions / counseling include(s) Lifestyle education regarding diet.         Pain Assessment/Treatment Pain Scale: 8/10. Method: Numeric Pain Intensity Scale. Location: back. Onset: 10/15/2013. Duration: varies. Quality: discomforting. Pain Assessment/Treatment follow-up plan of care: Patient is taking medications as prescribed..  Follow-up with me after L5 and S1 blocks on the right with Dr. Ollen Bowl.  Orders: Diagnostic Procedures: Assessment Procedure  M54.16 Return to Clinic  M54.16 TFESI - right - L5, possibly  - S1  Instruction(s)/Education: Assessment Instruction  [phone] Lifestyle education regarding diet     MEDICATIONS PRESCRIBED TODAY    Rx Quantity Refills  DILAUDID 2 mg  60 0            Provider:  Danae Orleans. Venetia Maxon MD  07/28/2014 01:45 PM Dictation edited by: Danae Orleans. Venetia Maxon    CC Providers: [facility] [address] [place],  [place]  [address]-   [facility] [address] [place], [place] [address]-         ----------------------------------------------------------------------------------------------------------------------------------------------------------------------         Electronically signed by Danae Orleans Venetia Maxon MD on 07/28/2014 01:45 PM  > [address] [place], [place] [address] Phone: [phone]   Patient ID:   [id number] Patient: Arthur Smith  Date of Birth: November 01, 1950 Visit Type: Office Visit   Date: 01/15/2014 10:00 AM Provider: Danae Orleans. Venetia Maxon MD   This 65 year old male presents for back pain.  History of Present Illness: 1.  back pain  12/22/11  T10-11 laminectomy  Pt returns reporting severe lumbar & RLE pain since falling on ice.    Meloxicam  1/day Tramadol  2 QID  x-ray on [place]  Patient states that 2-1/2 months ago he fell on the ice and landed on his back and landed hard on the steps.  Since that time he had pain shooting into his right leg into his right foot.  He feels he cannot get out of bed in the morning because of the severity of his pain.  He says his pain is worsening was before and is no longer radiating pain.  He did well after his T10 through T11 laminectomy.  He has been taking up to 8 tramadol per day and I told him this is too much medication.  He is also using meloxicam 15 mg daily.  I suggested he decreased the tramadol and will try hydrocodone for pain as well.  On examination today the patient has pain at the lumbosacral junction.  He is able to bend with an 18 inches  of his upper extremities  outstretched.  He has right greater than left sciatic notch discomfort and a markedly positive straight leg raise on the right.  He is able to stand on his heels and toes.  He has significantly decreased pain sensation all the way to the level of his knees bilaterally and also decreased vibratory sensation in both of his lower extremities in the feet with intact proprioception.  The patient is lost 20 pounds and then gained back.  Plain radiographs in the office today show severe degenerative changes in the thoracolumbar spine with spondylosis and calcified disc protrusions, most notably at the L4 L5 level.        PAST MEDICAL/SURGICAL HISTORY   (Detailed)  Disease/disorder Onset Date Management Date Comments    Surgery, thoracic spine 2013   Peripheral neuropathy          Family History  (Detailed)  Relationship Family Member Name Deceased Age at Death Condition Onset Age Cause of Death  Father    Stroke  N  Mother    Cancer, lung  N  Mother    Diabetes mellitus  N  Mother    Hypertension  N   SOCIAL HISTORY  (Detailed) Tobacco use reviewed. Preferred language is Unknown.   Smoking status: Former smoker.  SMOKING STATUS Use Status Type Smoking Status Usage Per Day Years Used Total Pack Years  yes  Former smoker             MEDICATIONS(added, continued or stopped this visit):   Started Medication Directions Instruction Stopped   Flomax 0.4 mg capsule take 1 capsule by oral route  every day 1/2 hour following the same meal each day     gabapentin 100 mg capsule take 1 capsule by oral route 4 times every day     gabapentin 800 mg tablet take 1 tablet by oral route 4 times every day     imipramine 25 mg tablet take 6 tablet by oral route  every day     meloxicam 15 mg tablet take 1 tablet by oral route  every day    01/15/2014 Norco 5 mg-325 mg tablet take 1 tablet by oral route  every 6 hours as needed for pain     tramadol 50 mg tablet take 2 tablet by oral route 4  times every day as needed     zonisamide 100 mg capsule take 3 capsule by oral route  every day      ALLERGIES:  Ingredient Reaction Medication Name Comment  NO KNOWN ALLERGIES     No known allergies.   Vitals Date Temp F BP Pulse Ht In Wt Lb BMI BSA Pain Score  01/15/2014  134/86 80 68 229 34.82  1/10        IMPRESSION Patient has a lumbar radiculopathy and also a peripheral neuropathy.  He has multilevel lumbar pathology.  Completed Orders (this encounter) Order Details Reason Side Interpretation Result Initial Treatment Date Region  Lifestyle education regarding diet Encouraged to eat a well balanced diet and follow up with primary care physician.        Lumbar Spine- AP/Lat/Flex/Ex      01/15/2014 All Levels to All Levels   Assessment/Plan # Detail Type Description   1. Assessment Lumbago (724.2).       2. Assessment Lumbar radiculopathy (724.4).       3. Assessment Peripheral neuropathy (356.9).       4. Assessment BMI 34.0-34.9,ADULT (V85.34).   Plan Orders  Today's instructions / counseling include(s) Lifestyle education regarding diet.         Pain Assessment/Treatment Pain Scale: 1/10. Method: Numeric Pain Intensity Scale. Location: back. Onset: 10/15/2013. Duration: varies. Quality: throbbing. Pain Assessment/Treatment follow-up plan of care: Patient currently taking pain medication as prescribed..  I recommended a lumbar MRI be obtained and I will see the patient back after that has been done.  I have given him a prescription for hydrocodone for pain and advised him to decrease his usage of tramadol.  Orders: Diagnostic Procedures: Assessment Procedure   Lumbar Spine- AP/Lat/Flex/Ex  724.4 Lumbar Spine- AP/Lat/Flex/Ex  724.4 MRI Spine/lumb With & W/o Contrast  Instruction(s)/Education: Assessment Instruction  V85.34 Lifestyle education regarding diet    MEDICATIONS PRESCRIBED TODAY    Rx Quantity Refills  NORCO 5 mg-325 mg  60 0             Provider:  Danae Orleans. Venetia Maxon MD  01/21/2014 05:45 PM Dictation edited by: Danae Orleans. Venetia Maxon    CC Providers: [facility] [address] [place],  [place]  [address]-   [facility] [address] [place], [place] [address]- ----------------------------------------------------------------------------------------------------------------------------------------------------------------------         Electronically signed by Danae Orleans Venetia Maxon MD on 01/21/2014 05:45 PM

## 2014-11-09 MED ORDER — PANTOPRAZOLE SODIUM 40 MG PO TBEC
40.0000 mg | DELAYED_RELEASE_TABLET | Freq: Every day | ORAL | Status: DC
  Administered 2014-11-09: 40 mg via ORAL
  Filled 2014-11-09: qty 1

## 2014-11-09 MED ORDER — OXYCODONE-ACETAMINOPHEN 5-325 MG PO TABS: 1.0000 | ORAL_TABLET | ORAL | Status: DC | PRN

## 2014-11-09 MED ORDER — DIAZEPAM 5 MG PO TABS: 5.0000 mg | ORAL_TABLET | Freq: Four times a day (QID) | ORAL | Status: DC | PRN

## 2014-11-09 NOTE — Progress Notes (Signed)
CARE MANAGEMENT NOTE 11/09/2014  Patient:  Arthur Smith,Arthur Smith   Account Number:  [id number]  Date Initiated:  11/09/2014  Documentation initiated by:  Jiles CrockerHANDLER,Charman Blasco  Subjective/Objective Assessment:   ADMITTED FOR SURGERY     Action/Plan:   CM FOLLOWING FOR DCP   Anticipated DC Date:  11/12/2014   Anticipated DC Plan:  AWAITING FOR PT/OT EVALS FOR DISPOSITION NEEDS     DC Planning Services  CM consult          Status of service:  In process, will continue to follow  Per UR Regulation:  Reviewed for med. necessity/level of care/duration of stay  Comments:  3/25/2016Abelino Derrick- B Harve Spradley RN,BSN,MHA [phone]

## 2014-11-09 NOTE — Progress Notes (Signed)
D/c to home via wheelchair to home with wife.  Breathing regular and unlabored on room air. Pain med given to manage pain during ride in car.  VSS. D/c instructions given and understood

## 2014-11-09 NOTE — Discharge Summary (Signed)
Physician Discharge Summary  Patient ID: Arthur FerronRoger A Smith MRN: [id number] DOB/AGE: 63/03/1951 64 y.o.  Admit date: 11/08/2014 Discharge date: 11/09/2014  Admission Diagnoses: Lumbar spondylosis and stenosis L4-L5 with radiculopathy  Discharge Diagnoses: Jeanie CooksMarr spondylosis and stenosis L4-L5 with radiculopathy and neurogenic claudication Active Problems:   Lumbar stenosis with neurogenic claudication   Discharged Condition: good  Hospital Course: Patient was admitted to undergo surgical decompression at L4-L5 tolerated surgery well. He's had good relief of his right leg symptoms. His incision is clean and dry.  Consults: None  Significant Diagnostic Studies: None  Treatments: surgery: Laminotomies and foraminotomies L4-5 and L5-S1.  Discharge Exam: Blood pressure 110/64, pulse 76, temperature 98.6 F (37 C), temperature source Oral, resp. rate 18, height 5\' 9"  (1.753 m), weight 103.6 kg (228 lb 6.3 oz), SpO2 96 %. Incision is clean and dry, motor function is intact in lower extremities. Station and gait is intact.  Disposition: 01-Home or Self Care  Discharge Instructions    Call MD for:  redness, tenderness, or signs of infection (pain, swelling, redness, odor or green/yellow discharge around incision site)    Complete by:  As directed      Call MD for:  severe uncontrolled pain    Complete by:  As directed      Call MD for:  temperature >100.4    Complete by:  As directed      Diet - low sodium heart healthy    Complete by:  As directed      Increase activity slowly    Complete by:  As directed             Medication List    TAKE these medications        aspirin EC 81 MG tablet  Take 162 mg by mouth daily.     atorvastatin 10 MG tablet  Commonly known as:  LIPITOR  Take 10 mg by mouth daily.     b complex vitamins tablet  Take 1 tablet by mouth daily.     diazepam 5 MG tablet  Commonly known as:  VALIUM  Take 1 tablet (5 mg total) by mouth every 6 (six) hours  as needed for muscle spasms.     gabapentin 100 MG capsule  Commonly known as:  NEURONTIN  TAKE 1 CAPSULE BY MOUTH 4 TIMES DAILY (TAKE WITH 800 MG TABLETS)     gabapentin 800 MG tablet  Commonly known as:  NEURONTIN  Take 1 tablet (800 mg total) by mouth 4 (four) times daily. Take with 100mg  caps     HYDROmorphone 2 MG tablet  Commonly known as:  DILAUDID  Take 2-4 mg by mouth every 6 (six) hours as needed for severe pain.     imipramine 25 MG tablet  Commonly known as:  TOFRANIL  TAKE 6 TABLETS BY MOUTH ONCE DAILY AT BEDTIME     oxyCODONE-acetaminophen 5-325 MG per tablet  Commonly known as:  PERCOCET/ROXICET  Take 1-2 tablets by mouth every 4 (four) hours as needed for moderate pain.     pregabalin 100 MG capsule  Commonly known as:  LYRICA  Take 1 capsule (100 mg total) by mouth 2 (two) times daily.     SENNA S 8.6-50 MG per tablet  Generic drug:  senna-docusate  Take 4 tablets by mouth at bedtime.     tamsulosin 0.4 MG Caps capsule  Commonly known as:  FLOMAX  Take 1 capsule (0.4 mg total) by mouth daily after supper.  zonisamide 100 MG capsule  Commonly known as:  ZONEGRAN  TAKE 3 CAPSULES BY MOUTH ONCE DAILY AT BEDTIME.     zonisamide 100 MG capsule  Commonly known as:  ZONEGRAN  TAKE 3 CAPSULES BY MOUTH ONCE DAILY AT BEDTIME         Signed: Stefani Dama 11/09/2014, 4:33 PM

## 2014-11-09 NOTE — Evaluation (Signed)
Physical Therapy Evaluation Patient Details Name: Arthur Smith MRN: [id number] DOB: 12-Dec-1950 Today's Date: 11/09/2014   History of Present Illness  Patient is a 64 y/o male with history of neuropathy, GERD, arthritis and previous lumbar lamy x 2 admitted 11/08/14 for L4-5, L5-S1 Laminectomy, decompression.  Clinical Impression  Patient presents without significant limitations for mobility.  Able to walk in hall with walker and negotiate stairs with railings.  No further skilled PT needs at this time.  Did discuss walking program at home and how to progress gradually.  Patient will benefit from walker to aide in car and shower transfers and initially with ambulation.    Follow Up Recommendations No PT follow up    Equipment Recommendations  Rolling walker with 5" wheels    Recommendations for Other Services       Precautions / Restrictions Precautions Precautions: Back Precaution Booklet Issued: Yes (comment)      Mobility  Bed Mobility               General bed mobility comments: NT, pt in chair; but demonstrated technique and reviewed with handout  Transfers Overall transfer level: Modified independent Equipment used: Rolling walker (2 wheeled)             General transfer comment: used arms on chair to stand  Ambulation/Gait Ambulation/Gait assistance: Modified independent (Device/Increase time) Ambulation Distance (Feet): 200 Feet Assistive device: Rolling walker (2 wheeled) Gait Pattern/deviations: Step-through pattern     General Gait Details: good speed, minimal weight on hands on walker  Stairs Stairs: Yes Stairs assistance: Modified independent (Device/Increase time) Stair Management: Two rails;Alternating pattern;Forwards Number of Stairs: 4 General stair comments: negotiated without incident or pain despite step through pattern  Wheelchair Mobility    Modified Rankin (Stroke Patients Only)       Balance Overall balance assessment:  No apparent balance deficits (not formally assessed)                                           Pertinent Vitals/Pain Pain Assessment: No/denies pain    Home Living Family/patient expects to be discharged to:: Private residence Living Arrangements: Spouse/significant other Available Help at Discharge: Family Type of Home: House Home Access: Stairs to enter Entrance Stairs-Rails: Can reach both Entrance Stairs-Number of Steps: 5 Home Layout: One level Home Equipment: None      Prior Function Level of Independence: Independent               Hand Dominance   Dominant Hand: Right    Extremity/Trunk Assessment               Lower Extremity Assessment: RLE deficits/detail;LLE deficits/detail RLE Deficits / Details: WFL LLE Deficits / Details: WFL     Communication   Communication: No difficulties  Cognition Arousal/Alertness: Awake/alert Behavior During Therapy: WFL for tasks assessed/performed Overall Cognitive Status: Within Functional Limits for tasks assessed                      General Comments General comments (skin integrity, edema, etc.): Educated in back precautions with mobility and ADL with handout given including car transfers, bed mobility and donning shoes/socks.  Discussed hip kit in giftshop    Exercises        Assessment/Plan    PT Assessment Patent does not need any further PT services  PT Diagnosis  Acute pain   PT Problem List    PT Treatment Interventions     PT Goals (Current goals can be found in the Care Plan section) Acute Rehab PT Goals PT Goal Formulation: All assessment and education complete, DC therapy    Frequency     Barriers to discharge        Co-evaluation               End of Session Equipment Utilized During Treatment: Gait belt Activity Tolerance: Patient tolerated treatment well Patient left: in chair;with call bell/phone within reach;with chair alarm set            Time: 1208-1232 PT Time Calculation (min) (ACUTE ONLY): 24 min   Charges:   PT Evaluation $Initial PT Evaluation Tier I: 1 Procedure PT Treatments $Self Care/Home Management: 8-22   PT G Codes:        Minard Millirons,CYNDI 22-Nov-2014, 1:13 PM  Magda Kiel, [place] 22-Nov-2014

## 2014-11-09 NOTE — Clinical Social Work Note (Cosign Needed)
CSW Consult Acknowledged:   CSW received a consult for SNF placement. CSW awaiting PT/OT evaluation to determine the appropriate level of care.      CSW reviewed chart. Per PT's evaluation SNF placement was not recommended. At this time CSW will sign off.    Lillyana Majette, MSW, LCSWA [phone]

## 2014-11-09 NOTE — Care Management Note (Signed)
CARE MANAGEMENT NOTE 11/09/2014  Patient:  Arthur Smith,Arthur Smith   Account Number:  [id number]  Date Initiated:  11/09/2014  Documentation initiated by:  Jiles CrockerHANDLER,BRENDA  Subjective/Objective Assessment:   ADMITTED FOR SURGERY     Action/Plan:   CM FOLLOWING FOR DCP   Anticipated DC Date:  11/12/2014   Anticipated DC Plan:  HOME W HOME HEALTH SERVICES      DC Planning Services  CM consult      Choice offered to / List presented to:     DME arranged  Levan HurstWALKER - ROLLING      DME agency  [facility]        Status of service:  Completed, signed off Medicare Important Message given?   (If response is "NO", the following Medicare IM given date fields will be blank) Date Medicare IM given:   Medicare IM given by:   Date Additional Medicare IM given:   Additional Medicare IM given by:    Discharge Disposition:    Per UR Regulation:  Reviewed for med. necessity/level of care/duration of stay  If discussed at Long Length of Stay Meetings, dates discussed:    Comments:  11/09/2014 1920 CM was informed patient is discharged and in need of Smith rolling walker.  CM talked with patient and [facility]  at [phone] [facility] CM spoke withpatient's  Nurse Arthur Harmanana and patient had already left in few miniutes.  CM called patient's wife, Arthur Smith at [phone] to determined how they want the rolling walker picked-up, if they want to come to the hospital anytime now or go to the agency to pick it up on Monday.  Wife says she is an employee of the [employer] and she is on schedule to work this weekend so she plans to pick it up tomorrow or next.  Says patient is doing well at home and patient already walked with Smith cane and did okay until the walker is picked up.  CM took Smith Teacher, early years/prewaker from [employer] floor storage and [facility] the Rolling walk[facility]  CM in-turn delivered the rolling walker to patient's Nurse - Arthur Smith Needham and the Charge Nurse  Arthur Smith on 4th floor to deliver to wife when she comes over this weekend to pick it up.   Arthur Fickola Katrice Goel, RN, BSN, CCM   3/25/2016Abelino Derrick- B CHANDLER RN,BSN,MHA [phone]

## 2014-11-09 NOTE — Progress Notes (Signed)
Patient arrived from PACU, alert and oriented. Patient oriented to room and equipment. VSS. MAEW. No c/o pain at this time, no acute distress noted. Dressing clean, dry and intact. Will monitor closely overnight.

## 2014-11-09 NOTE — Progress Notes (Signed)
Occupational Therapy Evaluation Patient Details Name: Arthur Smith MRN: [id number] DOB: 1951-03-14 Today's Date: 11/09/2014    History of Present Illness Patient is a 64 y/o male with history of neuropathy, GERD, arthritis and previous lumbar lamy x 2 admitted 11/08/14 for L4-5, L5-S1 Laminectomy, decompression.   Clinical Impression   Patient presents to OT with expected post-op deficits with ADLs. Patient has the necessary equipment and assistance at home to be safe and independent with ADLs. All education regarding ADLs and back precautions have been completed. No further OT needed.    Follow Up Recommendations  No OT follow up;Supervision - Intermittent    Equipment Recommendations  None recommended by OT    Recommendations for Other Services       Precautions / Restrictions Precautions Precautions: Back Precaution Booklet Issued: Yes (comment)      Mobility Bed Mobility               General bed mobility comments: NT, pt in chair; but demonstrated technique and reviewed with handout  Transfers Overall transfer level: Modified independent Equipment used: Rolling walker (2 wheeled)             General transfer comment: used arms on chair to stand    Balance Overall balance assessment: No apparent balance deficits (not formally assessed)                                          ADL Overall ADL's : Needs assistance/impaired Eating/Feeding: Independent   Grooming: Modified independent;Standing   Upper Body Bathing: Set up;Sitting   Lower Body Bathing: Minimal assistance;Sit to/from stand   Upper Body Dressing : Set up;Sitting   Lower Body Dressing: Minimal assistance;Sit to/from stand   Toilet Transfer: Modified Independent;Comfort height toilet;RW   Toileting- Clothing Manipulation and Hygiene: Modified independent;Sit to/from stand       Functional mobility during ADLs: Modified independent;Rolling walker General ADL  Comments: Reviewed back precautions with patient. He recalled all precautions. Educated patient on LB dressing techniques of crossing feet over knees and use of AE. He has a Sports administrator and shoe horn at home that he plans to use. For donning/doffing socks and for washing feet, he was educated on performing these tasks seated with one leg crossed up over knee at a time. Patient educated on where to obtain adaptive equipment should he choose to purchase it. For toileting, patient has elevated toilet seats at home with vanity support on one side to assist with standing. Denies need for 3 in 1. For showering, patiient has tub/shower with grab bars, seat, and handheld shower. He denies need to practice this, but was verbally educated on safe technique and to have supervision with this task initially at home. Patient and wife in agreement. All OT education completed and patient hopes to go home today.     Vision     Perception     Praxis      Pertinent Vitals/Pain Pain Assessment: No/denies pain     Hand Dominance Right   Extremity/Trunk Assessment Upper Extremity Assessment Upper Extremity Assessment: Overall WFL for tasks assessed   Lower Extremity Assessment Lower Extremity Assessment: Defer to PT evaluation RLE Deficits / Details: WFL RLE Sensation: history of peripheral neuropathy LLE Deficits / Details: WFL LLE Sensation: history of peripheral neuropathy       Communication Communication Communication: No difficulties   Cognition Arousal/Alertness: Awake/alert  Behavior During Therapy: WFL for tasks assessed/performed Overall Cognitive Status: Within Functional Limits for tasks assessed                     General Comments       Exercises       Shoulder Instructions      Home Living Family/patient expects to be discharged to:: Private residence Living Arrangements: Spouse/significant other Available Help at Discharge: Family Type of Home: House Home Access: Stairs  to enter Secretary/administratorntrance Stairs-Number of Steps: 5 Entrance Stairs-Rails: Can reach both Home Layout: One level     Bathroom Shower/Tub: Chief Strategy OfficerTub/shower unit   Bathroom Toilet: Standard Bathroom Accessibility: Yes How Accessible: Accessible via walker Home Equipment: Walker - 2 wheels;Shower seat;Grab bars - tub/shower   Additional Comments: also has reacher, shoe horn      Prior Functioning/Environment Level of Independence: Independent             OT Diagnosis: Acute pain   OT Problem List: Decreased activity tolerance;Pain   OT Treatment/Interventions:      OT Goals(Current goals can be found in the care plan section) Acute Rehab OT Goals Patient Stated Goal: to go home today  OT Frequency:     Barriers to D/C:            Co-evaluation              End of Session Equipment Utilized During Treatment: Rolling walker  Activity Tolerance: Patient tolerated treatment well Patient left: in chair;with call bell/phone within reach;with family/visitor present   Time: 1540-1559 OT Time Calculation (min): 19 min Charges:  OT General Charges $OT Visit: 1 Procedure OT Evaluation $Initial OT Evaluation Tier I: 1 Procedure G-Codes:    Lindzy Rupert A 11/09/2014, 4:10 PM

## 2014-11-10 NOTE — Anesthesia Postprocedure Evaluation (Signed)
  Anesthesia Post-op Note  Patient: Arthur Smith  Procedure(s) Performed: Procedure(s) (LRB): Right L4-5 L5-S1 Laminectomy (Right)  Patient Location: PACU  Anesthesia Type: General  Level of Consciousness: awake and alert   Airway and Oxygen Therapy: Patient Spontanous Breathing  Post-op Pain: mild  Post-op Assessment: Post-op Vital signs reviewed, Patient's Cardiovascular Status Stable, Respiratory Function Stable, Patent Airway and No signs of Nausea or vomiting  Last Vitals:  Filed Vitals:   11/09/14 1315  BP: 110/64  Pulse: 76  Temp: 37 C  Resp: 18    Post-op Vital Signs: stable   Complications: No apparent anesthesia complications

## 2014-11-12 ENCOUNTER — Encounter (HOSPITAL_COMMUNITY): Payer: Self-pay | Admitting: Neurosurgery

## 2015-03-15 ENCOUNTER — Other Ambulatory Visit: Payer: Self-pay | Admitting: Neurology

## 2015-04-10 IMAGING — CR DG LUMBAR SPINE 2-3V
2 series · 2 of 2 positions shown · non-contrast
Comparison: 02/20/2014

CLINICAL DATA: Lumbar discectomy

EXAM:
LUMBAR SPINE - 2-3 VIEW

[lat (1 of 2)]
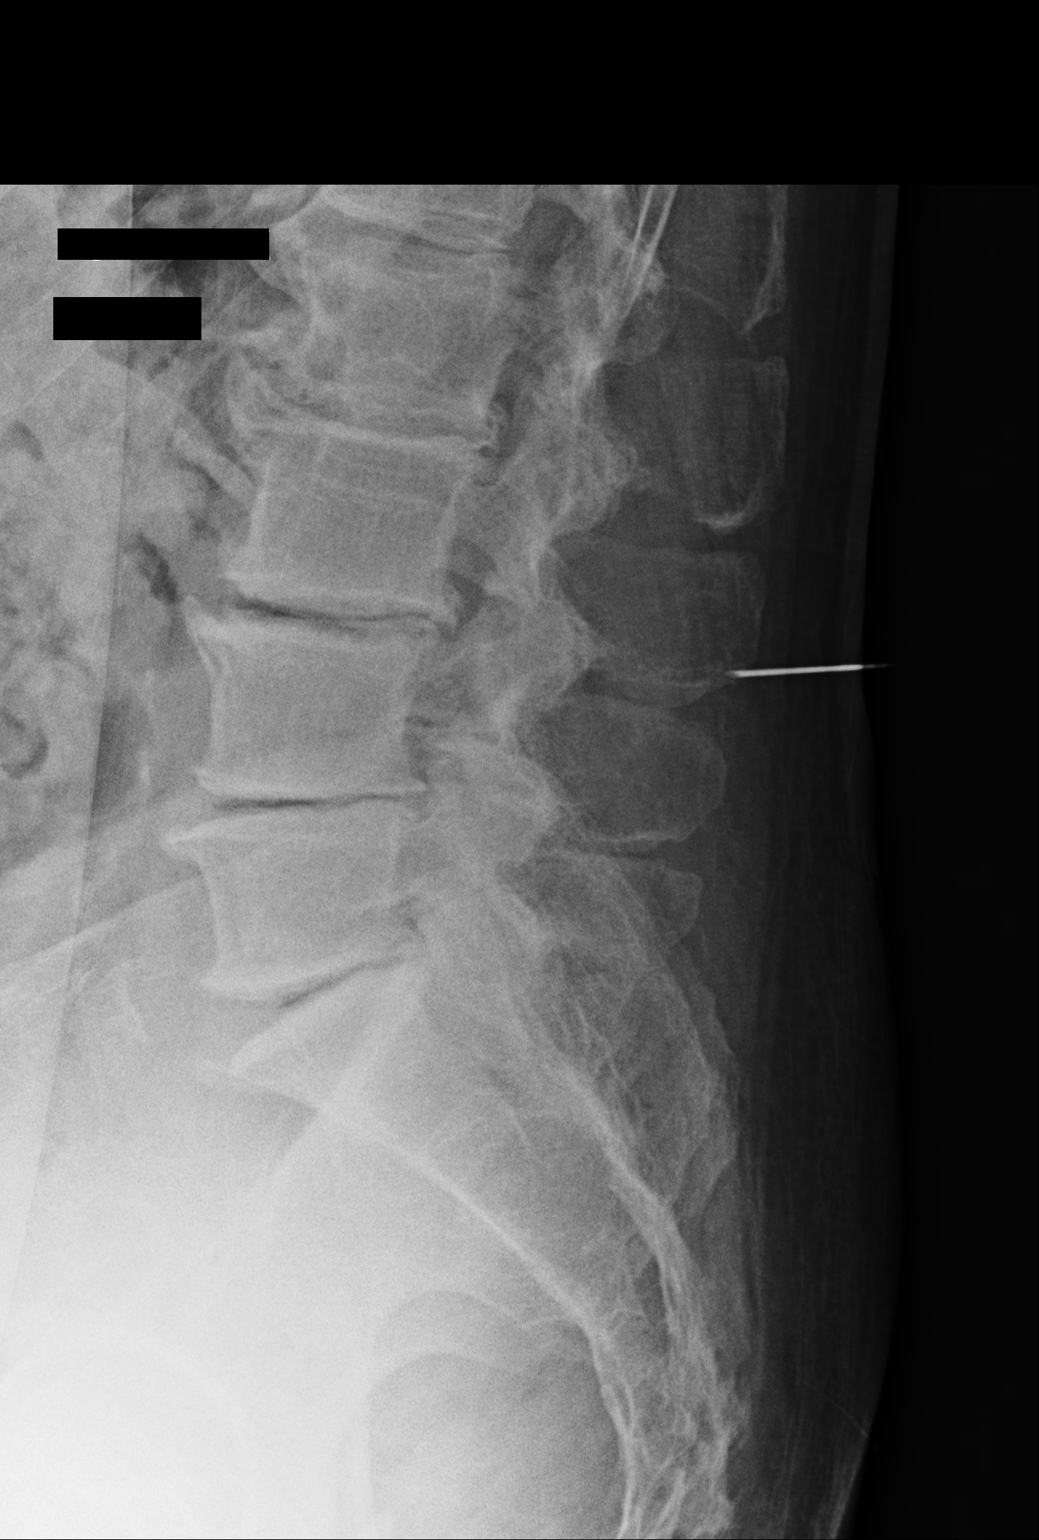

[lat (2 of 2)]
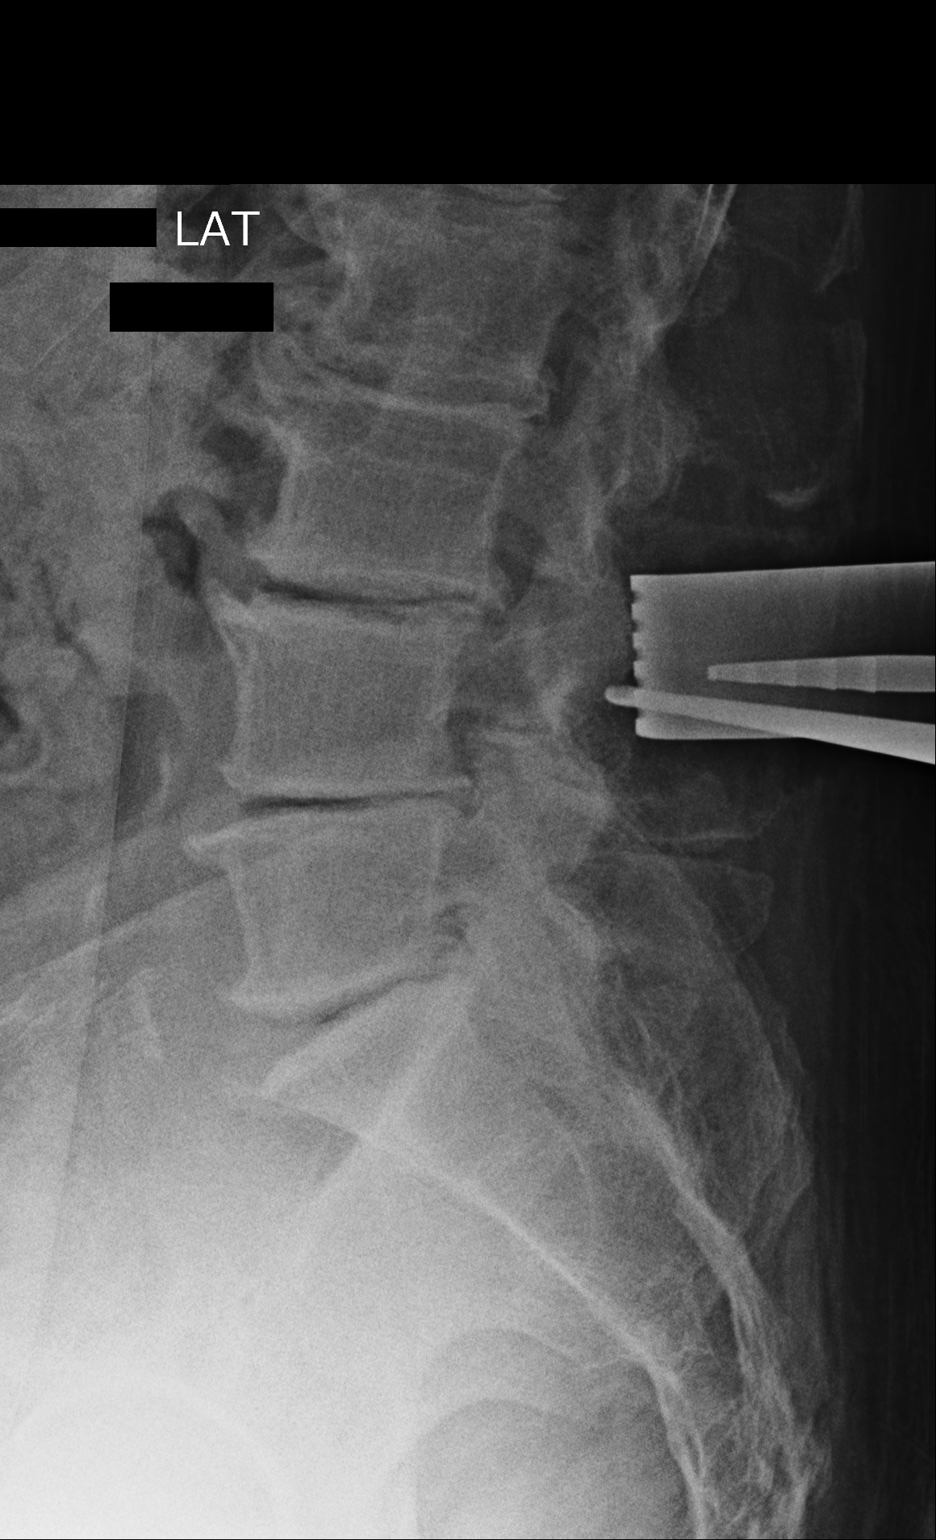

[2 of 2 positions shown; findings below may reference images not displayed]

FINDINGS: Two lateral films of lumbar spine were obtained. Initial film
obtained 806 hrs reveals a needle in the posterior soft tissues at
the L3-4 interspace. Multilevel disc space narrowing is noted. The
numbering nomenclature is similar to that used on prior MRI
examination. The second film obtained at 814 hrs shows a surgical
retractors well as surgical instruments adjacent to the posterior
elements of the L4 will vertebra just below the L3-4 interspace.

## 2015-05-02 ENCOUNTER — Encounter: Payer: Self-pay | Admitting: Nurse Practitioner

## 2015-05-02 ENCOUNTER — Ambulatory Visit (INDEPENDENT_AMBULATORY_CARE_PROVIDER_SITE_OTHER): Payer: PRIVATE HEALTH INSURANCE | Admitting: Nurse Practitioner

## 2015-05-02 VITALS — BP 103/71 | HR 81 | Ht 70.0 in | Wt 204.2 lb

## 2015-05-02 DIAGNOSIS — M4806 Spinal stenosis, lumbar region: Secondary | ICD-10-CM | POA: Diagnosis not present

## 2015-05-02 DIAGNOSIS — M48062 Spinal stenosis, lumbar region with neurogenic claudication: Secondary | ICD-10-CM

## 2015-05-02 DIAGNOSIS — G609 Hereditary and idiopathic neuropathy, unspecified: Secondary | ICD-10-CM | POA: Diagnosis not present

## 2015-05-02 MED ORDER — GABAPENTIN 800 MG PO TABS: 800.0000 mg | ORAL_TABLET | Freq: Four times a day (QID) | ORAL | Status: DC

## 2015-05-02 MED ORDER — ZONISAMIDE 100 MG PO CAPS: ORAL_CAPSULE | ORAL | Status: DC

## 2015-05-02 MED ORDER — IMIPRAMINE HCL 25 MG PO TABS: ORAL_TABLET | ORAL | Status: DC

## 2015-05-02 MED ORDER — PREGABALIN 100 MG PO CAPS: 100.0000 mg | ORAL_CAPSULE | Freq: Two times a day (BID) | ORAL | Status: DC

## 2015-05-02 NOTE — Patient Instructions (Addendum)
Continue Neurontin  at current dose will refill Neurontin  caps decrease by 1 cap every 5 days until discontinued. If symptoms resume go back to previous dose Continue Lyrica will refill Continue Imipramine at current dose Continue Zonegran at current dose F/U in 8 months

## 2015-05-02 NOTE — Progress Notes (Signed)
[facility]  PATIENT: Arthur Smith  DOB: 23-Sep-1950   REASON FOR VISIT: Follow-up  for peripheral neuropathy  HISTORY FROM: Patient    HISTORY OF PRESENT ILLNESS:Mr. Arthur Smith is a 64 year old right-handed white male with  peripheral neuropathy that is felt to be inherited, the patient's brother also has a neuropathy. The patient denies any significant balance issues. He denies any falls or stumbles. He also has chronic low back pain, had right L4-L5 laminectomy  March ,24th by Dr. Venetia Maxon . The patient is on gabapentin and Lyrica combination, and this seems to work well for him. He is wanting to taper down on some of medications. He is now going to water aerobics several times a week He indicates that he sleeps fairly well at night. He returns to this office for further evaluation.    REVIEW OF SYSTEMS: Full 14 system review of systems performed and notable only for those listed, all others are neg:  Constitutional: neg  Cardiovascular: neg Ear/Nose/Throat: Ringing in the ears  Skin: neg Eyes: neg Respiratory: neg Gastroitestinal: neg  Hematology/Lymphatic: neg  Endocrine: neg Musculoskeletal: Recent back surgery Allergy/Immunology: neg Neurological: Numbness Psychiatric: neg Sleep : neg   ALLERGIES: Allergies  Allergen Reactions  . Eggs Or Egg-Derived Products     intolerance  . Lactose Intolerance (Gi)     May have skim milk    HOME MEDICATIONS: Outpatient Prescriptions Prior to Visit  Medication Sig Dispense Refill  . aspirin EC 81 MG tablet Take 162 mg by mouth daily.     Marland Kitchen atorvastatin (LIPITOR) 10 MG tablet Take 10 mg by mouth daily.    Marland Kitchen b complex vitamins tablet Take 1 tablet by mouth daily.    . diazepam (VALIUM) 5 MG tablet Take 1 tablet (5 mg total) by mouth every 6 (six) hours as needed for muscle spasms. 30 tablet 0  . gabapentin (NEURONTIN) 100 MG capsule TAKE 1 CAPSULE BY MOUTH 4 TIMES DAILY (TAKE WITH 800 MG TABLETS) 360 capsule 2   . gabapentin (NEURONTIN) 800 MG tablet Take 1 tablet (800 mg total) by mouth 4 (four) times daily. Take with  caps 360 tablet 2  . HYDROmorphone (DILAUDID) 2 MG tablet Take 2-4 mg by mouth every 6 (six) hours as needed for severe pain.    Marland Kitchen imipramine (TOFRANIL) 25 MG tablet TAKE 6 TABLETS BY MOUTH ONCE DAILY AT BEDTIME 540 tablet 2  . LYRICA 100 MG capsule TAKE 1 CAPSULE BY MOUTH TWICE DAILY 180 capsule 0  . senna-docusate (SENNA S) 8.6-50 MG per tablet Take 4 tablets by mouth at bedtime.     . tamsulosin (FLOMAX) 0.4 MG CAPS capsule Take 1 capsule (0.4 mg total) by mouth daily after supper. (Patient taking differently: Take 0.8 mg by mouth daily after supper. )    . zonisamide (ZONEGRAN) 100 MG capsule TAKE 3 CAPSULES BY MOUTH ONCE DAILY AT BEDTIME 270 capsule 1  . oxyCODONE-acetaminophen (PERCOCET/ROXICET) 5-325 MG per tablet Take 1-2 tablets by mouth every 4 (four) hours as needed for moderate pain. (Patient not taking: Reported on 05/02/2015) 60 tablet 0  . zonisamide (ZONEGRAN) 100 MG capsule TAKE 3 CAPSULES BY MOUTH ONCE DAILY AT BEDTIME. 270 capsule 2   No facility-administered medications prior to visit.    PAST MEDICAL HISTORY: Past Medical History  Diagnosis Date  . Recurrent upper respiratory infection (URI)     SOB CURRENT COLD  . Arthritis   . Frequency of urination     URINATION AT NIGHT,  PAINFUL SLOW STREAM   . No pertinent past medical history     RINGING RIGHT EAR  . Shortness of breath     d/t "cold" 12/02/11  . Hyperlipidemia   . GERD (gastroesophageal reflux disease)     occ  . Neuropathy   . Depression     denies  . Bronchitis with asthma, acute April 2013  . Neuromuscular disorder     DIZZINESS   PERIPHERAL NEUROPATHY     PAST SURGICAL HISTORY: Past Surgical History  Procedure Laterality Date  . Hernia repair      UMBILICAL HERNIA 2011  . Finger surgery      RIGHT MIDDLE FINGER  . Multiple tooth extractions    . Lumbar laminectomy/decompression  microdiscectomy  12/22/2011    Procedure: LUMBAR LAMINECTOMY/DECOMPRESSION MICRODISCECTOMY 1 LEVEL;  Surgeon: Maeola Harman, MD;  Location: [facility];  Service: Neurosurgery;  Laterality: N/A;  Thoracic ten-eleven laminectomy  . Lumbar laminectomy/decompression microdiscectomy Right 05/01/2014    Procedure: Right Lumbar three-four Microdiskectomy;  Surgeon: Maeola Harman, MD;  Location: [facility];  Service: Neurosurgery;  Laterality: Right;  . Lumbar laminectomy/decompression microdiscectomy Right 11/08/2014    Procedure: Right L4-5 L5-S1 Laminectomy;  Surgeon: Maeola Harman, MD;  Location: [facility];  Service: Neurosurgery;  Laterality: Right;  Right L4-5 L5-S1 Laminectomy    FAMILY HISTORY: Family History  Problem Relation Age of Onset  . Cancer Mother   . Stroke Father   . Neuropathy Brother     SOCIAL HISTORY: Social History   Social History  . Marital Status: Married    Spouse Name: Arthur Smith  . Number of Children: 3  . Years of Education: 12   Occupational History  . Retired   .     Social History Main Topics  . Smoking status: Former Smoker -- 2.00 packs/day for 15 years    Types: Cigarettes    Quit date: 04/24/1984  . Smokeless tobacco: Never Used  . Alcohol Use: Yes     Comment: occ  . Drug Use: No  . Sexual Activity: Not on file   Other Topics Concern  . Not on file   Social History Narrative   Patient lives at home Arthur Smith his wife.    Patient has 3 adult children and 1 step son.    Patient has a high school education.    Patient is retired.    [employer]      PHYSICAL EXAM  Filed Vitals:   05/02/15 0902  BP: 103/71  Pulse: 81  Height: 5\' 10"  (1.778 m)  Weight: 204 lb 3.2 oz (92.625 kg)   Body mass index is 29.3 kg/(m^2). General: The patient is alert and cooperative at the time of the examination. The patient is minimally obese. Skin: No significant peripheral edema is noted. Neurologic Exam Mental status: The patient is oriented x 3. Cranial  nerves: Facial symmetry is present. Speech is normal, no aphasia or dysarthria is noted. Extraocular movements are full. Visual fields are full. Motor: The patient has good strength in all 4 extremities. The patient is able walk on heels and toes bilaterally. Sensory examination: There is a stocking pattern pinprick sensory deficit up to the knees bilaterally. Vibratory decreased at the toes. Coordination: The patient has good finger-nose-finger and heel-to-shin bilaterally. Gait and station: The patient has a normal gait. Tandem gait is normal. Romberg is negative. No drift is seen. Reflexes: Deep tendon reflexes are symmetric, but are depressed.   DIAGNOSTIC DATA (LABS, IMAGING,  TESTING) - I reviewed patient records, labs, notes, testing and imaging myself where available.  Lab Results  Component Value Date   WBC 7.1 10/29/2014   HGB 14.1 10/29/2014   HCT 41.8 10/29/2014   MCV 91.3 10/29/2014   PLT 269 10/29/2014      Component Value Date/Time   NA 140 10/29/2014 1429   K 4.0 10/29/2014 1429   CL 109 10/29/2014 1429   CO2 22 10/29/2014 1429   GLUCOSE 106* 10/29/2014 1429   BUN 9 10/29/2014 1429   CREATININE 1.02 10/29/2014 1429   CALCIUM 8.9 10/29/2014 1429   PROT 6.8 04/15/2014 1550   ALBUMIN 3.6 04/15/2014 1550   AST 25 04/15/2014 1550   ALT 32 04/15/2014 1550   ALKPHOS 151* 04/15/2014 1550   BILITOT 0.4 04/15/2014 1550   GFRNONAA 76* 10/29/2014 1429   GFRAA 88* 10/29/2014 1429    ASSESSMENT AND PLAN  64 y.o. year old male  has a past medical history of  Neuropathy; ; and Neuromuscular disorder. here to follow-up. His neuropathy symptoms are relatively stable and he would like to taper1 his medications. History of chronic back pain with recent right L4-L5 laminectomy in March with relief of those symptoms  Continue Neurontin 800mg  at current dose will refill Neurontin 100mg  caps decrease by 1 cap every 5 days until discontinued. If symptoms resume go back to previous  dose Continue Lyrica will refill Continue Imipramine at current dose Continue Zonegran at current dose F/U in 8 months Nilda Riggs , [facility], [facility], APRN  [facility] [address], [address] [place], [place] [address] [phone]

## 2015-05-02 NOTE — Progress Notes (Signed)
I have read the note, and I agree with the clinical assessment and plan.  Dezmin Kittelson KEITH   

## 2015-05-20 ENCOUNTER — Other Ambulatory Visit: Payer: Self-pay | Admitting: Neurology

## 2015-08-28 MED FILL — IMIPRAMINE HCL 50 MG TABLET: 50 | 90 days supply | Qty: 270 | Fill #0

## 2015-08-28 MED FILL — MYRBETRIQ ER 25 MG TABLET: 25 | 30 days supply | Qty: 30 | Fill #5

## 2015-09-10 MED FILL — LYRICA 100 MG CAPSULE: 100 | 90 days supply | Qty: 180 | Fill #1

## 2015-09-10 MED FILL — GABAPENTIN 100 MG CAPSULE: 100 | 90 days supply | Qty: 360 | Fill #1

## 2015-09-10 MED FILL — GABAPENTIN 800 MG TABLET: 800 | 90 days supply | Qty: 360 | Fill #1

## 2015-09-30 MED FILL — MYRBETRIQ ER 25 MG TABLET: 25 | 30 days supply | Qty: 30 | Fill #6

## 2015-10-29 MED FILL — ATORVASTATIN 10 MG TABLET: 10 | 90 days supply | Qty: 90 | Fill #3

## 2015-10-29 MED FILL — MYRBETRIQ ER 25 MG TABLET: 25 | 30 days supply | Qty: 30 | Fill #7

## 2015-11-15 MED FILL — MELOXICAM 15 MG TABLET: 15 | 30 days supply | Qty: 30 | Fill #0

## 2015-11-15 MED FILL — ZONISAMIDE 100 MG CAPSULE: 100 | 90 days supply | Qty: 270 | Fill #1

## 2015-11-15 MED FILL — IMIPRAMINE HCL 50 MG TABLET: 50 | 90 days supply | Qty: 270 | Fill #1

## 2015-11-15 MED FILL — TAMSULOSIN HCL 0.4 MG CAP: 0.4 | 60 days supply | Qty: 120 | Fill #4

## 2015-11-29 MED FILL — MYRBETRIQ ER 25 MG TABLET: 25 | 30 days supply | Qty: 30 | Fill #8

## 2015-12-11 ENCOUNTER — Other Ambulatory Visit: Payer: Self-pay | Admitting: *Deleted

## 2015-12-11 MED ORDER — PREGABALIN 100 MG PO CAPS: 100.0000 mg | ORAL_CAPSULE | Freq: Two times a day (BID) | ORAL | Status: DC

## 2015-12-11 MED ORDER — ZONISAMIDE 100 MG PO CAPS: ORAL_CAPSULE | ORAL | Status: DC

## 2015-12-11 MED FILL — GABAPENTIN 800 MG TABLET: 800 | 90 days supply | Qty: 360 | Fill #2

## 2015-12-11 MED FILL — GABAPENTIN 100 MG CAPSULE: 100 | 90 days supply | Qty: 360 | Fill #2

## 2015-12-11 NOTE — Telephone Encounter (Signed)
Needing refills on lyrica and zonisamide.

## 2015-12-12 ENCOUNTER — Encounter: Payer: Self-pay | Admitting: *Deleted

## 2015-12-12 MED FILL — LYRICA 100 MG CAPSULE: 100 | 90 days supply | Qty: 180 | Fill #0

## 2015-12-12 NOTE — Progress Notes (Signed)
Faxed printed rx pregabalin to pt pharmacy. Fax: [phone]: [phone]. Received confirmation.

## 2015-12-26 DIAGNOSIS — Z6833 Body mass index (BMI) 33.0-33.9, adult: Secondary | ICD-10-CM | POA: Diagnosis not present

## 2015-12-26 DIAGNOSIS — N401 Enlarged prostate with lower urinary tract symptoms: Secondary | ICD-10-CM | POA: Diagnosis not present

## 2015-12-26 DIAGNOSIS — Z23 Encounter for immunization: Secondary | ICD-10-CM | POA: Diagnosis not present

## 2015-12-26 DIAGNOSIS — Z0000 Encounter for general adult medical examination without abnormal findings: Secondary | ICD-10-CM | POA: Diagnosis not present

## 2015-12-26 DIAGNOSIS — G603 Idiopathic progressive neuropathy: Secondary | ICD-10-CM | POA: Diagnosis not present

## 2015-12-26 DIAGNOSIS — J301 Allergic rhinitis due to pollen: Secondary | ICD-10-CM | POA: Diagnosis not present

## 2015-12-26 DIAGNOSIS — E7800 Pure hypercholesterolemia, unspecified: Secondary | ICD-10-CM | POA: Diagnosis not present

## 2015-12-26 DIAGNOSIS — N528 Other male erectile dysfunction: Secondary | ICD-10-CM | POA: Diagnosis not present

## 2015-12-26 DIAGNOSIS — E669 Obesity, unspecified: Secondary | ICD-10-CM | POA: Diagnosis not present

## 2015-12-26 MED FILL — VIAGRA 50 MG TABLET: 50 | 30 days supply | Qty: 6 | Fill #0

## 2015-12-26 MED FILL — ATORVASTATIN 20 MG TABLET: 20 | 90 days supply | Qty: 90 | Fill #0

## 2015-12-30 DIAGNOSIS — R739 Hyperglycemia, unspecified: Secondary | ICD-10-CM | POA: Diagnosis not present

## 2015-12-30 MED FILL — MYRBETRIQ ER 25 MG TABLET: 25 | 30 days supply | Qty: 30 | Fill #9

## 2015-12-31 ENCOUNTER — Encounter: Payer: Self-pay | Admitting: Nurse Practitioner

## 2015-12-31 ENCOUNTER — Ambulatory Visit (INDEPENDENT_AMBULATORY_CARE_PROVIDER_SITE_OTHER): Payer: PRIVATE HEALTH INSURANCE | Admitting: Nurse Practitioner

## 2015-12-31 VITALS — BP 122/78 | HR 80 | Ht 70.0 in | Wt 217.8 lb

## 2015-12-31 DIAGNOSIS — M4806 Spinal stenosis, lumbar region: Secondary | ICD-10-CM

## 2015-12-31 DIAGNOSIS — G609 Hereditary and idiopathic neuropathy, unspecified: Secondary | ICD-10-CM | POA: Diagnosis not present

## 2015-12-31 DIAGNOSIS — M48062 Spinal stenosis, lumbar region with neurogenic claudication: Secondary | ICD-10-CM

## 2015-12-31 MED ORDER — IMIPRAMINE HCL 25 MG PO TABS: ORAL_TABLET | ORAL | Status: DC

## 2015-12-31 MED ORDER — GABAPENTIN 100 MG PO CAPS: ORAL_CAPSULE | ORAL | Status: DC

## 2015-12-31 MED ORDER — GABAPENTIN 800 MG PO TABS: 800.0000 mg | ORAL_TABLET | Freq: Four times a day (QID) | ORAL | Status: DC

## 2015-12-31 NOTE — Patient Instructions (Signed)
Continue Neurontin 800mg  4 times daily will refill Continue Neurontin 100mg  caps with 800mg  dose will refill Continue Lyrica will refill Continue Imipramine at current dose Continue Zonegran at current dose F/U in 8 months

## 2015-12-31 NOTE — Progress Notes (Signed)
I have read the note, and I agree with the clinical assessment and plan.  Arthur Smith   

## 2015-12-31 NOTE — Progress Notes (Signed)
[facility]  PATIENT: Arthur FerronRoger A Smith DOB: 07/04/1951   REASON FOR VISIT: Follow-up for peripheral neuropathy,  HISTORY FROM: Patient    HISTORY OF PRESENT ILLNESS:Mr. Arthur Smith is a 65 year old right-handed white male with peripheral neuropathy that is felt to be inherited, the patient's brother also has a neuropathy. The patient denies any significant balance issues. He denies any falls or stumbles. He also has chronic low back pain, had right L4-L5 laminectomy March 24th, 2016 by Dr. Venetia Smith . The patient is on gabapentin and Lyrica combination, and this seems to work well for him. He has attempted to  taper down on gabapentin without success . He is now going to water aerobics several times a week He indicates that he sleeps fairly well at night. He returns to this office for further evaluation.   REVIEW OF SYSTEMS: Full 14 system review of systems performed and notable only for those listed, all others are neg:  Constitutional: neg  Cardiovascular: neg Ear/Nose/Throat: Ringing in the ears Skin: neg Eyes: neg Respiratory: neg Gastroitestinal: neg  Hematology/Lymphatic: neg  Endocrine: neg Musculoskeletal:neg Allergy/Immunology: Allergies  Neurological: neg Psychiatric: neg Sleep : neg   ALLERGIES: Allergies  Allergen Reactions  . Eggs Or Egg-Derived Products     intolerance  . Lactose Intolerance (Gi)     May have skim milk    HOME MEDICATIONS: Outpatient Prescriptions Prior to Visit  Medication Sig Dispense Refill  . aspirin EC 81 MG tablet Take 162 mg by mouth daily.     Marland Kitchen. atorvastatin (LIPITOR) 10 MG tablet Take 10 mg by mouth daily.    Marland Kitchen. b complex vitamins tablet Take 1 tablet by mouth daily.    . diazepam (VALIUM) 5 MG tablet Take 1 tablet (5 mg total) by mouth every 6 (six) hours as needed for muscle spasms. 30 tablet 0  . gabapentin (NEURONTIN) 100 MG capsule TAKE 1 CAPSULE BY MOUTH 4 TIMES DAILY (TAKE WITH 800 MG TABLETS) 360 capsule 1    . gabapentin (NEURONTIN) 800 MG tablet Take 1 tablet (800 mg total) by mouth 4 (four) times daily. Take with 100mg  caps 360 tablet 2  . HYDROmorphone (DILAUDID) 2 MG tablet Take 2-4 mg by mouth every 6 (six) hours as needed for severe pain.    Marland Kitchen. imipramine (TOFRANIL) 25 MG tablet TAKE 6 TABLETS BY MOUTH ONCE DAILY AT BEDTIME 540 tablet 2  . mirabegron ER (MYRBETRIQ) 25 MG TB24 tablet Take 25 mg by mouth daily.    . pregabalin (LYRICA) 100 MG capsule Take 1 capsule (100 mg total) by mouth 2 (two) times daily. 180 capsule 1  . senna-docusate (SENNA S) 8.6-50 MG per tablet Take 4 tablets by mouth at bedtime.     . tamsulosin (FLOMAX) 0.4 MG CAPS capsule Take 1 capsule (0.4 mg total) by mouth daily after supper. (Patient taking differently: Take 0.8 mg by mouth daily after supper. )    . zonisamide (ZONEGRAN) 100 MG capsule TAKE 3 CAPSULES BY MOUTH ONCE DAILY AT BEDTIME 270 capsule 1   No facility-administered medications prior to visit.    PAST MEDICAL HISTORY: Past Medical History  Diagnosis Date  . Recurrent upper respiratory infection (URI)     SOB CURRENT COLD  . Arthritis   . Frequency of urination     URINATION AT NIGHT, PAINFUL SLOW STREAM   . No pertinent past medical history     RINGING RIGHT EAR  . Shortness of breath     d/t "cold" 12/02/11  .  Hyperlipidemia   . GERD (gastroesophageal reflux disease)     occ  . Neuropathy (HCC)   . Depression     denies  . Bronchitis with asthma, acute April 2013  . Neuromuscular disorder (HCC)     DIZZINESS   PERIPHERAL NEUROPATHY     PAST SURGICAL HISTORY: Past Surgical History  Procedure Laterality Date  . Hernia repair      UMBILICAL HERNIA 2011  . Finger surgery      RIGHT MIDDLE FINGER  . Multiple tooth extractions    . Lumbar laminectomy/decompression microdiscectomy  12/22/2011    Procedure: LUMBAR LAMINECTOMY/DECOMPRESSION MICRODISCECTOMY 1 LEVEL;  Surgeon: Arthur Harman, MD;  [facility];  Service: Neurosurgery;   Laterality: N/A;  Thoracic ten-eleven laminectomy  . Lumbar laminectomy/decompression microdiscectomy Right 05/01/2014    Procedure: Right Lumbar three-four Microdiskectomy;  Surgeon: Arthur Harman, MD;  [facility];  Service: Neurosurgery;  Laterality: Right;  . Lumbar laminectomy/decompression microdiscectomy Right 11/08/2014    Procedure: Right L4-5 L5-S1 Laminectomy;  Surgeon: Arthur Harman, MD;  [facility];  Service: Neurosurgery;  Laterality: Right;  Right L4-5 L5-S1 Laminectomy    FAMILY HISTORY: Family History  Problem Relation Age of Onset  . Cancer Mother   . Stroke Father   . Neuropathy Brother     SOCIAL HISTORY: Social History   Social History  . Marital Status: Married    Spouse Name: Arthur Smith  . Number of Children: 3  . Years of Education: 12   Occupational History  . Retired   .     Social History Main Topics  . Smoking status: Former Smoker -- 2.00 packs/day for 15 years    Types: Cigarettes    Quit date: 04/24/1984  . Smokeless tobacco: Never Used  . Alcohol Use: Yes     Comment: occ  . Drug Use: No  . Sexual Activity: Not on file   Other Topics Concern  . Not on file   Social History Narrative   Patient lives at home Arthur Smith his wife.    Patient has 3 adult children and 1 step son.    Patient has a high school education.    Patient is retired.    [employer]      PHYSICAL EXAM  Filed Vitals:   12/31/15 0908  BP: 122/78  Pulse: 80  Height: 5\' 10"  (1.778 m)  Weight: 217 lb 12.8 oz (98.793 kg)   Body mass index is 31.25 kg/(m^2). General: The patient is alert and cooperative at the time of the examination. The patient is minimally obese. Skin: No significant peripheral edema is noted. Neurologic Exam Mental status: The patient is oriented x 3. Cranial nerves: Facial symmetry is present. Speech is normal, no aphasia or dysarthria is noted. Extraocular movements are full. Visual fields are full. Motor: The patient has good  strength in all 4 extremities. The patient is able walk on heels and toes bilaterally. Sensory examination: There is a stocking pattern pinprick sensory deficit up to the knees bilaterally. Vibratory decreased at the toes. Coordination: The patient has good finger-nose-finger and heel-to-shin bilaterally. Gait and station: The patient has a normal gait. Tandem gait is normal. Romberg is negative. No drift is seen. Reflexes: Deep tendon reflexes are symmetric, but are depressed. DIAGNOSTIC DATA (LABS, IMAGING, TESTING) -  ASSESSMENT AND PLAN 65 y.o. year old male has a past medical history of Neuropathy;  here to follow-up. His neuropathy symptoms are relatively stable. History of chronic back  pain with right L4-L5 laminectomy in March 2016 with relief of those symptoms  Continue Neurontin  4 times daily will refill Continue Neurontin  caps with  dose will refill Continue Lyrica will refill Continue Imipramine at current dose Continue Zonegran at current dose F/U in 8 months Nilda Riggs, [facility], [facility], APRN  [facility] [address], [address] [place], [place] [address] [phone]

## 2016-01-14 MED FILL — TAMSULOSIN HCL 0.4 MG CAP: 0.4 | 30 days supply | Qty: 60 | Fill #0

## 2016-01-21 DIAGNOSIS — R351 Nocturia: Secondary | ICD-10-CM | POA: Diagnosis not present

## 2016-01-21 DIAGNOSIS — R972 Elevated prostate specific antigen [PSA]: Secondary | ICD-10-CM | POA: Diagnosis not present

## 2016-01-21 DIAGNOSIS — N401 Enlarged prostate with lower urinary tract symptoms: Secondary | ICD-10-CM | POA: Diagnosis not present

## 2016-01-27 MED FILL — FINASTERIDE 5 MG TABLET: 5 | 30 days supply | Qty: 30 | Fill #0

## 2016-01-28 MED FILL — MYRBETRIQ ER 25 MG TABLET: 25 | 30 days supply | Qty: 30 | Fill #10

## 2016-02-10 ENCOUNTER — Telehealth: Payer: Self-pay | Admitting: Nurse Practitioner

## 2016-02-10 MED FILL — TAMSULOSIN HCL 0.4 MG CAP: 0.4 | 30 days supply | Qty: 60 | Fill #1

## 2016-02-10 MED FILL — IMIPRAMINE HCL 25 MG TABLET: 25 | 90 days supply | Qty: 540 | Fill #0

## 2016-02-10 NOTE — Telephone Encounter (Signed)
Pt called said he rec'd a call but not a msg from [email or web address]. sts he called pharmacy and requested medications but he doesn't know why he rec'd the call. Operator advised there is no documentation in his chart  but a msg would be left and if he was needed they would call again.

## 2016-02-12 MED FILL — ZONISAMIDE 100 MG CAPSULE: 100 | 90 days supply | Qty: 270 | Fill #0

## 2016-02-21 MED FILL — FINASTERIDE 5 MG TABLET: 5 | 30 days supply | Qty: 30 | Fill #1

## 2016-02-25 MED FILL — MYRBETRIQ ER 25 MG TABLET: 25 | 30 days supply | Qty: 30 | Fill #0

## 2016-03-04 MED FILL — MELOXICAM 15 MG TABLET: 15 | 30 days supply | Qty: 30 | Fill #0

## 2016-03-06 MED FILL — GABAPENTIN 800 MG TABLET: 800 | 90 days supply | Qty: 360 | Fill #0

## 2016-03-06 MED FILL — GABAPENTIN 100 MG CAPSULE: 100 | 90 days supply | Qty: 360 | Fill #0

## 2016-03-09 MED FILL — TAMSULOSIN HCL 0.4 MG CAP: 0.4 | 90 days supply | Qty: 180 | Fill #0

## 2016-03-10 MED FILL — LYRICA 100 MG CAPSULE: 100 | 90 days supply | Qty: 180 | Fill #1

## 2016-03-30 MED FILL — FINASTERIDE 5 MG TABLET: 5 | 30 days supply | Qty: 30 | Fill #2

## 2016-03-30 MED FILL — MYRBETRIQ ER 25 MG TABLET: 25 | 30 days supply | Qty: 30 | Fill #1

## 2016-04-21 MED FILL — VIAGRA 50 MG TABLET: 50 | 30 days supply | Qty: 6 | Fill #1

## 2016-04-21 MED FILL — ATORVASTATIN 20 MG TABLET: 20 | 90 days supply | Qty: 90 | Fill #1

## 2016-04-28 MED FILL — MYRBETRIQ ER 25 MG TABLET: 25 | 30 days supply | Qty: 30 | Fill #2

## 2016-04-28 MED FILL — FINASTERIDE 5 MG TABLET: 5 | 30 days supply | Qty: 30 | Fill #3

## 2016-05-07 MED FILL — ZONISAMIDE 100 MG CAPSULE: 100 | 90 days supply | Qty: 270 | Fill #1

## 2016-05-22 MED FILL — MELOXICAM 15 MG TABLET: 15 | 30 days supply | Qty: 30 | Fill #0

## 2016-05-22 MED FILL — IMIPRAMINE HCL 25 MG TABLET: 25 | 90 days supply | Qty: 540 | Fill #1

## 2016-05-25 ENCOUNTER — Other Ambulatory Visit: Payer: Self-pay | Admitting: Nurse Practitioner

## 2016-05-25 MED ORDER — PREGABALIN 100 MG PO CAPS
100.0000 mg | ORAL_CAPSULE | Freq: Two times a day (BID) | ORAL | 1 refills | Status: DC
Start: 2016-05-25 — End: 2016-12-17

## 2016-05-25 MED FILL — MYRBETRIQ ER 25 MG TABLET: 25 | 30 days supply | Qty: 30 | Fill #3

## 2016-05-25 MED FILL — LYRICA 100 MG CAPSULE: 100 | 90 days supply | Qty: 180 | Fill #0

## 2016-05-25 MED FILL — FINASTERIDE 5 MG TABLET: 5 | 30 days supply | Qty: 30 | Fill #4

## 2016-05-25 MED FILL — VIAGRA 50 MG TABLET: 50 | 30 days supply | Qty: 6 | Fill #2

## 2016-05-25 NOTE — Telephone Encounter (Signed)
Fax confirmation received. [facility].

## 2016-05-25 NOTE — Telephone Encounter (Signed)
Pt request refill for pregabalin (LYRICA) 100 MG capsule [facility].

## 2016-05-25 NOTE — Telephone Encounter (Signed)
Appt 09-01-2016

## 2016-06-04 MED FILL — GABAPENTIN 800 MG TABLET: 800 | 90 days supply | Qty: 360 | Fill #1

## 2016-06-04 MED FILL — GABAPENTIN 100 MG CAPSULE: 100 | 90 days supply | Qty: 360 | Fill #1

## 2016-06-15 MED FILL — TAMSULOSIN HCL 0.4 MG CAP: 0.4 | 90 days supply | Qty: 180 | Fill #1

## 2016-06-29 MED FILL — FINASTERIDE 5 MG TABLET: 5 | 30 days supply | Qty: 30 | Fill #5

## 2016-06-29 MED FILL — VIAGRA 50 MG TABLET: 50 | 30 days supply | Qty: 6 | Fill #3

## 2016-06-29 MED FILL — MYRBETRIQ ER 25 MG TABLET: 25 | 30 days supply | Qty: 30 | Fill #4

## 2016-07-28 MED FILL — MYRBETRIQ ER 25 MG TABLET: 25 | 30 days supply | Qty: 30 | Fill #5

## 2016-07-28 MED FILL — ATORVASTATIN 20 MG TABLET: 20 | 90 days supply | Qty: 90 | Fill #2

## 2016-07-29 DIAGNOSIS — Z23 Encounter for immunization: Secondary | ICD-10-CM | POA: Diagnosis not present

## 2016-08-03 ENCOUNTER — Other Ambulatory Visit: Payer: Self-pay | Admitting: *Deleted

## 2016-08-03 MED ORDER — ZONISAMIDE 100 MG PO CAPS: ORAL_CAPSULE | ORAL | 1 refills | Status: DC

## 2016-08-03 MED FILL — FINASTERIDE 5 MG TABLET: 5 | 30 days supply | Qty: 30 | Fill #6

## 2016-08-03 MED FILL — ZONISAMIDE 100 MG CAPSULE: 100 | 90 days supply | Qty: 270 | Fill #0

## 2016-08-18 MED FILL — IMIPRAMINE HCL 50 MG TABLET: 50 | 90 days supply | Qty: 270 | Fill #0

## 2016-08-27 MED FILL — MYRBETRIQ ER 25 MG TABLET: 25 | 30 days supply | Qty: 30 | Fill #6

## 2016-09-01 ENCOUNTER — Encounter: Payer: Self-pay | Admitting: Nurse Practitioner

## 2016-09-01 ENCOUNTER — Ambulatory Visit (INDEPENDENT_AMBULATORY_CARE_PROVIDER_SITE_OTHER): Payer: PRIVATE HEALTH INSURANCE | Admitting: Nurse Practitioner

## 2016-09-01 VITALS — BP 132/85 | HR 87 | Ht 70.0 in | Wt 221.8 lb

## 2016-09-01 DIAGNOSIS — G609 Hereditary and idiopathic neuropathy, unspecified: Secondary | ICD-10-CM

## 2016-09-01 MED ORDER — GABAPENTIN 800 MG PO TABS: 800.0000 mg | ORAL_TABLET | Freq: Four times a day (QID) | ORAL | 3 refills | Status: DC

## 2016-09-01 MED ORDER — GABAPENTIN 100 MG PO CAPS: ORAL_CAPSULE | ORAL | 3 refills | Status: DC

## 2016-09-01 MED ORDER — IMIPRAMINE HCL 25 MG PO TABS: ORAL_TABLET | ORAL | 3 refills | Status: DC

## 2016-09-01 MED FILL — GABAPENTIN 100 MG CAPSULE: 100 | 90 days supply | Qty: 360 | Fill #0

## 2016-09-01 MED FILL — GABAPENTIN 800 MG TABLET: 800 | 90 days supply | Qty: 360 | Fill #0

## 2016-09-01 NOTE — Patient Instructions (Addendum)
Continue Neurontin 800mg  4 times daily will refill Continue Neurontin 100mg  caps with 800mg  dose will refill Continue Lyrica  Continue Imipramine at current dose will refill Continue Zonegran at current dose F/U in 1 year

## 2016-09-01 NOTE — Progress Notes (Signed)
[facility]  PATIENT: Arthur Smith DOB: 1950/12/13   REASON FOR VISIT: Follow-up for peripheral neuropathy,  HISTORY FROM: Patient    HISTORY OF PRESENT ILLNESS:Arthur Smith is a 66 year old right-handed white male with peripheral neuropathy that is felt to be inherited, the patient's brother also has a neuropathy. The patient denies any significant balance issues. He denies any falls or stumbles. He also has chronic low back pain, had right L4-L5 laminectomy March 24th, 2016 by Dr. Venetia Maxon . The patient is on gabapentin and Lyrica combination, and this seems to work well for him. He has attempted to  taper down on gabapentin without success . He indicates that he sleeps fairly well at night. He returns to this office for further evaluation.   REVIEW OF SYSTEMS: Full 14 system review of systems performed and notable only for those listed, all others are neg:  Constitutional: neg  Cardiovascular: neg Ear/Nose/Throat: Ringing in the ears, hearing loss Skin: neg Eyes: neg Respiratory: neg Gastroitestinal: neg  Hematology/Lymphatic: neg  Endocrine: neg Musculoskeletal: Back pain followed by Dr. Venetia Maxon Allergy/Immunology: Allergies  Neurological: neg Psychiatric: neg Sleep : neg   ALLERGIES: Allergies  Allergen Reactions  . Eggs Or Egg-Derived Products     intolerance  . Lactose Intolerance (Gi)     May have skim milk    HOME MEDICATIONS: Outpatient Medications Prior to Visit  Medication Sig Dispense Refill  . aspirin EC 81 MG tablet Take 162 mg by mouth daily.     Marland Kitchen atorvastatin (LIPITOR) 10 MG tablet Take 10 mg by mouth daily.    Marland Kitchen b complex vitamins tablet Take 1 tablet by mouth daily.    . diazepam (VALIUM) 5 MG tablet Take 1 tablet (5 mg total) by mouth every 6 (six) hours as needed for muscle spasms. 30 tablet 0  . gabapentin (NEURONTIN) 100 MG capsule TAKE 1 CAPSULE BY MOUTH 4 TIMES DAILY (TAKE WITH 800 MG TABLETS) 360 capsule 2  . gabapentin  (NEURONTIN) 800 MG tablet Take 1 tablet (800 mg total) by mouth 4 (four) times daily. Take with 100mg  caps 360 tablet 2  . HYDROmorphone (DILAUDID) 2 MG tablet Take 2-4 mg by mouth every 6 (six) hours as needed for severe pain.    Marland Kitchen imipramine (TOFRANIL) 25 MG tablet TAKE 6 TABLETS BY MOUTH ONCE DAILY AT BEDTIME 540 tablet 2  . meloxicam (MOBIC) 15 MG tablet   0  . mirabegron ER (MYRBETRIQ) 25 MG TB24 tablet Take 25 mg by mouth daily.    . Omega-3 Fatty Acids (FISH OIL OMEGA-3 PO) Take 1 capsule by mouth daily.    . pregabalin (LYRICA) 100 MG capsule Take 1 capsule (100 mg total) by mouth 2 (two) times daily. 180 capsule 1  . senna-docusate (SENNA S) 8.6-50 MG per tablet Take 4 tablets by mouth at bedtime.     Marland Kitchen Specialty Vitamins Products (BIOTIN PLUS KERATIN) 10000-100 MCG-MG TABS Take by mouth.    . tamsulosin (FLOMAX) 0.4 MG CAPS capsule Take 1 capsule (0.4 mg total) by mouth daily after supper. (Patient taking differently: Take 0.8 mg by mouth daily after supper. )    . VIAGRA 50 MG tablet as needed.   11  . zonisamide (ZONEGRAN) 100 MG capsule TAKE 3 CAPSULES BY MOUTH ONCE DAILY AT BEDTIME 270 capsule 1   No facility-administered medications prior to visit.     PAST MEDICAL HISTORY: Past Medical History:  Diagnosis Date  . Arthritis   . Bronchitis with asthma,  acute April 2013  . Depression    denies  . Frequency of urination    URINATION AT NIGHT, PAINFUL SLOW STREAM   . GERD (gastroesophageal reflux disease)    occ  . Hyperlipidemia   . Neuromuscular disorder (HCC)    DIZZINESS   PERIPHERAL NEUROPATHY   . Neuropathy (HCC)   . No pertinent past medical history    RINGING RIGHT EAR  . Recurrent upper respiratory infection (URI)    SOB CURRENT COLD  . Shortness of breath    d/t "cold" 12/02/11    PAST SURGICAL HISTORY: Past Surgical History:  Procedure Laterality Date  . FINGER SURGERY     RIGHT MIDDLE FINGER  . HERNIA REPAIR     UMBILICAL HERNIA 2011  . LUMBAR  LAMINECTOMY/DECOMPRESSION MICRODISCECTOMY  12/22/2011   Procedure: LUMBAR LAMINECTOMY/DECOMPRESSION MICRODISCECTOMY 1 LEVEL;  Surgeon: Maeola HarmanJoseph Stern, MD;  Location: [facility];  Service: Neurosurgery;  Laterality: N/A;  Thoracic ten-eleven laminectomy  . LUMBAR LAMINECTOMY/DECOMPRESSION MICRODISCECTOMY Right 05/01/2014   Procedure: Right Lumbar three-four Microdiskectomy;  Surgeon: Maeola HarmanJoseph Stern, MD;  [facility];  Service: Neurosurgery;  Laterality: Right;  . LUMBAR LAMINECTOMY/DECOMPRESSION MICRODISCECTOMY Right 11/08/2014   Procedure: Right L4-5 L5-S1 Laminectomy;  Surgeon: Maeola HarmanJoseph Stern, MD;  [facility];  Service: Neurosurgery;  Laterality: Right;  Right L4-5 L5-S1 Laminectomy  . MULTIPLE TOOTH EXTRACTIONS      FAMILY HISTORY: Family History  Problem Relation Age of Onset  . Cancer Mother   . Stroke Father   . Neuropathy Brother     SOCIAL HISTORY: Social History   Social History  . Marital status: Married    Spouse name: Merge  . Number of children: 3  . Years of education: 12   Occupational History  . Retired   .     Social History Main Topics  . Smoking status: Former Smoker    Packs/day: 2.00    Years: 15.00    Types: Cigarettes    Quit date: 04/24/1984  . Smokeless tobacco: Never Used  . Alcohol use Yes     Comment: occ  . Drug use: No  . Sexual activity: Not on file   Other Topics Concern  . Not on file   Social History Narrative   Patient lives at home Merge his wife.    Patient has 3 adult children and 1 step son.    Patient has a high school education.    Patient is retired.    [employer]      PHYSICAL EXAM  Vitals:   09/01/16 0938  BP: 132/85  Pulse: 87  Weight: 221 lb 12.8 oz (100.6 kg)  Height: 5\' 10"  (1.778 m)   Body mass index is 31.82 kg/m. General: The patient is alert and cooperative at the time of the examination. The patient is minimally obese. Skin: No significant peripheral edema is noted. Neurologic Exam Mental  status: The patient is oriented x 3. Cranial nerves: Facial symmetry is present. Speech is normal, no aphasia or dysarthria is noted. Extraocular movements are full. Visual fields are full. Motor: The patient has good strength in all 4 extremities. The patient is able walk on heels and toes bilaterally. Sensory examination: There is a stocking pattern pinprick sensory deficit up to the knees bilaterally. Vibratory decreased at the toes and ankles. Coordination: The patient has good finger-nose-finger and heel-to-shin bilaterally. Gait and station: The patient has a normal gait. Tandem gait is normal. Romberg is negative. No drift is  seen. Reflexes: Deep tendon reflexes are symmetric, but are depressed. DIAGNOSTIC DATA (LABS, IMAGING, TESTING) -  ASSESSMENT AND PLAN 66 y.o. year old male has a past medical history of Neuropathy;  here to follow-up. His neuropathy symptoms are  stable. History of chronic back pain with right L4-L5 laminectomy in March 2016 by Dr. Venetia Maxon  Continue Neurontin 800mg  4 times daily will refill Continue Neurontin 100mg  caps with 800mg  dose will refill Continue Lyrica will refill Continue Imipramine at current dose Continue Zonegran at current dose F/U in 8 months Nilda Riggs, [facility] [place], APRN  [facility] [address], [address] [place], [place] [address] [phone]

## 2016-09-01 NOTE — Progress Notes (Signed)
I have read the note, and I agree with the clinical assessment and plan.  Nadene Witherspoon KEITH   

## 2016-09-07 MED FILL — FINASTERIDE 5 MG TABLET: 5 | 30 days supply | Qty: 30 | Fill #7

## 2016-09-11 MED FILL — SILDENAFIL 50 MG TABLET: 50 | 30 days supply | Qty: 6 | Fill #4

## 2016-09-11 MED FILL — LYRICA 100 MG CAPSULE: 100 | 90 days supply | Qty: 180 | Fill #1

## 2016-09-11 MED FILL — TAMSULOSIN HCL 0.4 MG CAP: 0.4 | 90 days supply | Qty: 180 | Fill #2

## 2016-09-24 MED FILL — MYRBETRIQ ER 25 MG TABLET: 25 | 30 days supply | Qty: 30 | Fill #7

## 2016-10-06 MED FILL — FINASTERIDE 5 MG TABLET: 5 | 30 days supply | Qty: 30 | Fill #8

## 2016-10-21 MED FILL — MYRBETRIQ ER 25 MG TABLET: 25 | 30 days supply | Qty: 30 | Fill #8

## 2016-10-21 MED FILL — ATORVASTATIN 20 MG TABLET: 20 | 90 days supply | Qty: 90 | Fill #3

## 2016-11-04 MED FILL — ZONISAMIDE 100 MG CAPSULE: 100 | 90 days supply | Qty: 270 | Fill #1

## 2016-11-04 MED FILL — IMIPRAMINE HCL 50 MG TABLET: 50 | 90 days supply | Qty: 270 | Fill #0

## 2016-11-04 MED FILL — FINASTERIDE 5 MG TABLET: 5 | 30 days supply | Qty: 30 | Fill #9

## 2016-11-23 DIAGNOSIS — N390 Urinary tract infection, site not specified: Secondary | ICD-10-CM | POA: Diagnosis not present

## 2016-11-23 DIAGNOSIS — R300 Dysuria: Secondary | ICD-10-CM | POA: Diagnosis not present

## 2016-11-23 MED FILL — MYRBETRIQ ER 25 MG TABLET: 25 | 30 days supply | Qty: 30 | Fill #9

## 2016-11-23 MED FILL — SULFAMETHOXAZOLE/TMP DS TAB: 800-160 | 7 days supply | Qty: 14 | Fill #0

## 2016-12-02 MED FILL — FINASTERIDE 5 MG TABLET: 5 | 30 days supply | Qty: 30 | Fill #10

## 2016-12-16 MED FILL — TAMSULOSIN HCL 0.4 MG CAP: 0.4 | 90 days supply | Qty: 180 | Fill #3

## 2016-12-16 MED FILL — GABAPENTIN 100 MG CAPSULE: 100 | 90 days supply | Qty: 360 | Fill #1

## 2016-12-16 MED FILL — GABAPENTIN 800 MG TABLET: 800 | 90 days supply | Qty: 360 | Fill #1

## 2016-12-17 ENCOUNTER — Other Ambulatory Visit: Payer: Self-pay | Admitting: *Deleted

## 2016-12-17 MED ORDER — PREGABALIN 100 MG PO CAPS: 100.0000 mg | ORAL_CAPSULE | Freq: Two times a day (BID) | ORAL | 3 refills | Status: DC

## 2016-12-17 MED FILL — LYRICA 100 MG CAPSULE: 100 | 90 days supply | Qty: 180 | Fill #0

## 2016-12-17 MED FILL — MYRBETRIQ ER 25 MG TABLET: 25 | 30 days supply | Qty: 30 | Fill #10

## 2016-12-17 NOTE — Telephone Encounter (Signed)
[phone] [email or web address]

## 2016-12-31 MED FILL — FINASTERIDE 5 MG TABLET: 5 | 30 days supply | Qty: 30 | Fill #11

## 2017-01-25 MED FILL — ATORVASTATIN 20 MG TABLET: 20 | 90 days supply | Qty: 90 | Fill #0

## 2017-01-25 MED FILL — MYRBETRIQ ER 25 MG TABLET: 25 | 30 days supply | Qty: 30 | Fill #11

## 2017-02-01 ENCOUNTER — Other Ambulatory Visit: Payer: Self-pay | Admitting: Nurse Practitioner

## 2017-02-01 MED FILL — ZONISAMIDE 100 MG CAPSULE: 100 | 90 days supply | Qty: 270 | Fill #0

## 2017-02-01 MED FILL — IMIPRAMINE HCL 50 MG TABLET: 50 | 90 days supply | Qty: 270 | Fill #1

## 2017-02-02 MED FILL — FINASTERIDE 5 MG TABLET: 5 | 30 days supply | Qty: 30 | Fill #0

## 2017-02-22 MED FILL — MYRBETRIQ ER 25 MG TABLET: 25 | 30 days supply | Qty: 30 | Fill #12

## 2017-03-02 MED FILL — FINASTERIDE 5 MG TABLET: 5 | 30 days supply | Qty: 30 | Fill #1

## 2017-03-16 MED FILL — TAMSULOSIN HCL 0.4 MG CAP: 0.4 | 30 days supply | Qty: 60 | Fill #0

## 2017-03-18 MED FILL — LYRICA 100 MG CAPSULE: 100 | 90 days supply | Qty: 180 | Fill #1

## 2017-03-22 MED FILL — GABAPENTIN 800 MG TABLET: 800 | 90 days supply | Qty: 360 | Fill #2

## 2017-03-22 MED FILL — GABAPENTIN 100 MG CAP: 100 | 90 days supply | Qty: 360 | Fill #2

## 2017-03-26 MED FILL — MYRBETRIQ ER 25 MG TABLET: 25 | 30 days supply | Qty: 30 | Fill #0

## 2017-04-02 MED FILL — FINASTERIDE 5 MG TABLET: 5 | 30 days supply | Qty: 30 | Fill #2

## 2017-04-12 MED FILL — TAMSULOSIN HCL 0.4 MG CAP: 0.4 | 30 days supply | Qty: 60 | Fill #1

## 2017-04-20 MED FILL — MYRBETRIQ ER 25 MG TABLET: 25 | 30 days supply | Qty: 30 | Fill #1

## 2017-04-20 MED FILL — ATORVASTATIN 20 MG TABLET: 20 | 90 days supply | Qty: 90 | Fill #1

## 2017-05-03 MED FILL — IMIPRAMINE HCL 50 MG TABLET: 50 | 90 days supply | Qty: 270 | Fill #2

## 2017-05-05 MED FILL — FINASTERIDE 5 MG TABLET: 5 | 30 days supply | Qty: 30 | Fill #3

## 2017-05-06 DIAGNOSIS — N401 Enlarged prostate with lower urinary tract symptoms: Secondary | ICD-10-CM | POA: Diagnosis not present

## 2017-05-06 DIAGNOSIS — R972 Elevated prostate specific antigen [PSA]: Secondary | ICD-10-CM | POA: Diagnosis not present

## 2017-05-06 DIAGNOSIS — R3915 Urgency of urination: Secondary | ICD-10-CM | POA: Diagnosis not present

## 2017-05-11 MED FILL — ZONISAMIDE 100 MG CAPSULE: 100 | 90 days supply | Qty: 270 | Fill #1

## 2017-05-11 MED FILL — TAMSULOSIN HCL 0.4 MG CAP: 0.4 | 30 days supply | Qty: 60 | Fill #2

## 2017-05-20 MED FILL — MYRBETRIQ ER 50 MG TABLET: 50 | 30 days supply | Qty: 30 | Fill #0

## 2017-06-02 MED FILL — FINASTERIDE 5 MG TABLET: 5 | 30 days supply | Qty: 30 | Fill #4

## 2017-06-17 MED FILL — TAMSULOSIN HCL 0.4 MG CAP: 0.4 | 30 days supply | Qty: 60 | Fill #3

## 2017-06-17 MED FILL — GABAPENTIN 100 MG CAP: 100 | 90 days supply | Qty: 360 | Fill #3

## 2017-06-17 MED FILL — LYRICA 100 MG CAPSULE: 100 | 90 days supply | Qty: 180 | Fill #2

## 2017-06-17 MED FILL — GABAPENTIN 800 MG TABLET: 800 | 90 days supply | Qty: 360 | Fill #3

## 2017-06-28 MED FILL — MYRBETRIQ ER 50 MG TABLET: 50 | 30 days supply | Qty: 30 | Fill #1

## 2017-06-28 MED FILL — FINASTERIDE 5 MG TABLET: 5 | 30 days supply | Qty: 30 | Fill #5

## 2017-07-19 MED FILL — ATORVASTATIN 20 MG TABLET: 20 | 90 days supply | Qty: 90 | Fill #2

## 2017-07-19 MED FILL — TAMSULOSIN HCL 0.4 MG CAP: 0.4 | 30 days supply | Qty: 60 | Fill #4

## 2017-07-29 MED FILL — MYRBETRIQ ER 50 MG TABLET: 50 | 30 days supply | Qty: 30 | Fill #2

## 2017-07-29 MED FILL — FINASTERIDE 5 MG TABLET: 5 | 30 days supply | Qty: 30 | Fill #6

## 2017-08-05 MED FILL — IMIPRAMINE HCL 50 MG TABLET: 50 | 90 days supply | Qty: 270 | Fill #3

## 2017-08-12 ENCOUNTER — Other Ambulatory Visit: Payer: Self-pay | Admitting: Neurology

## 2017-08-12 MED FILL — ZONISAMIDE 100 MG CAPSULE: 100 | 90 days supply | Qty: 270 | Fill #0

## 2017-08-18 MED FILL — TAMSULOSIN HCL 0.4 MG CAP: 0.4 | 30 days supply | Qty: 60 | Fill #5

## 2017-08-25 MED FILL — FINASTERIDE 5 MG TABLET: 5 | 30 days supply | Qty: 30 | Fill #7

## 2017-08-25 MED FILL — MYRBETRIQ ER 50 MG TABLET: 50 | 90 days supply | Qty: 90 | Fill #0

## 2017-08-31 NOTE — Progress Notes (Signed)
[facility]  PATIENT: Arthur Smith DOB: 1951-01-16   REASON FOR VISIT: Follow-up for peripheral neuropathy,  HISTORY FROM: Patient    HISTORY OF PRESENT ILLNESS:Arthur Smith is a 67 year old right-handed white male with peripheral neuropathy that is felt to be inherited, the patient's brother also has a neuropathy. The patient denies any significant balance issues. He denies any falls or stumbles. He also has chronic low back pain, had right L4-L5 laminectomy March 24th, 2016 by Dr. Venetia Maxon . The patient is on gabapentin and Lyrica combination, and this seems to work well for him. He has attempted to  taper down on gabapentin without success . He indicates that he sleeps fairly well at night. He returns to this office for further evaluation.  He has no new neurologic complaints   REVIEW OF SYSTEMS: Full 14 system review of systems performed and notable only for those listed, all others are neg:  Constitutional: neg  Cardiovascular: neg Ear/Nose/Throat: Ringing in the ears, hearing loss Skin: neg Eyes: neg Respiratory: neg Gastroitestinal: neg  Hematology/Lymphatic: neg  Endocrine: neg Musculoskeletal: Back pain followed by Dr. Venetia Maxon Allergy/Immunology: Allergies  Neurological: neg Psychiatric: neg Sleep : neg   ALLERGIES: Allergies  Allergen Reactions  . Eggs Or Egg-Derived Products     intolerance  . Lactose Intolerance (Gi)     May have skim milk    HOME MEDICATIONS: Outpatient Medications Prior to Visit  Medication Sig Dispense Refill  . aspirin EC 81 MG tablet Take 162 mg by mouth daily.     Marland Kitchen atorvastatin (LIPITOR) 10 MG tablet Take 10 mg by mouth daily.    Marland Kitchen b complex vitamins tablet Take 1 tablet by mouth daily.    . diazepam (VALIUM) 5 MG tablet Take 1 tablet (5 mg total) by mouth every 6 (six) hours as needed for muscle spasms. 30 tablet 0  . finasteride (PROSCAR) 5 MG tablet Take 5 mg by mouth daily.  11  . gabapentin (NEURONTIN) 100  MG capsule TAKE 1 CAPSULE BY MOUTH 4 TIMES DAILY (TAKE WITH 800 MG TABLETS) 360 capsule 3  . gabapentin (NEURONTIN) 800 MG tablet Take 1 tablet (800 mg total) by mouth 4 (four) times daily. Take with 100mg  caps 360 tablet 3  . HYDROmorphone (DILAUDID) 2 MG tablet Take 2-4 mg by mouth every 6 (six) hours as needed for severe pain.    Marland Kitchen imipramine (TOFRANIL) 25 MG tablet TAKE 6 TABLETS BY MOUTH ONCE DAILY AT BEDTIME 540 tablet 3  . meloxicam (MOBIC) 15 MG tablet   0  . mirabegron ER (MYRBETRIQ) 25 MG TB24 tablet Take 25 mg by mouth daily.    . Omega-3 Fatty Acids (FISH OIL OMEGA-3 PO) Take 1 capsule by mouth daily.    . pregabalin (LYRICA) 100 MG capsule Take 1 capsule (100 mg total) by mouth 2 (two) times daily. 180 capsule 3  . senna-docusate (SENNA S) 8.6-50 MG per tablet Take 4 tablets by mouth at bedtime.     Marland Kitchen Specialty Vitamins Products (BIOTIN PLUS KERATIN) 10000-100 MCG-MG TABS Take by mouth.    . tamsulosin (FLOMAX) 0.4 MG CAPS capsule Take 1 capsule (0.4 mg total) by mouth daily after supper. (Patient taking differently: Take 0.8 mg by mouth daily after supper. )    . VIAGRA 50 MG tablet as needed.   11  . zonisamide (ZONEGRAN) 100 MG capsule TAKE 3 CAPSULES BY MOUTH ONCE DAILY AT BEDTIME 270 capsule 0   No facility-administered medications prior to visit.  PAST MEDICAL HISTORY: Past Medical History:  Diagnosis Date  . Arthritis   . Bronchitis with asthma, acute April 2013  . Depression    denies  . Frequency of urination    URINATION AT NIGHT, PAINFUL SLOW STREAM   . GERD (gastroesophageal reflux disease)    occ  . Hyperlipidemia   . Neuromuscular disorder (HCC)    DIZZINESS   PERIPHERAL NEUROPATHY   . Neuropathy   . No pertinent past medical history    RINGING RIGHT EAR  . Recurrent upper respiratory infection (URI)    SOB CURRENT COLD  . Shortness of breath    d/t "cold" 12/02/11    PAST SURGICAL HISTORY: Past Surgical History:  Procedure Laterality Date  .  FINGER SURGERY     RIGHT MIDDLE FINGER  . HERNIA REPAIR     UMBILICAL HERNIA 2011  . LUMBAR LAMINECTOMY/DECOMPRESSION MICRODISCECTOMY  12/22/2011   Procedure: LUMBAR LAMINECTOMY/DECOMPRESSION MICRODISCECTOMY 1 LEVEL;  Surgeon: Maeola Harman, MD;  [facility];  Service: Neurosurgery;  Laterality: N/A;  Thoracic ten-eleven laminectomy  . LUMBAR LAMINECTOMY/DECOMPRESSION MICRODISCECTOMY Right 05/01/2014   Procedure: Right Lumbar three-four Microdiskectomy;  Surgeon: Maeola Harman, MD;  [facility];  Service: Neurosurgery;  Laterality: Right;  . LUMBAR LAMINECTOMY/DECOMPRESSION MICRODISCECTOMY Right 11/08/2014   Procedure: Right L4-5 L5-S1 Laminectomy;  Surgeon: Maeola Harman, MD;  [facility];  Service: Neurosurgery;  Laterality: Right;  Right L4-5 L5-S1 Laminectomy  . MULTIPLE TOOTH EXTRACTIONS      FAMILY HISTORY: Family History  Problem Relation Age of Onset  . Cancer Mother   . Stroke Father   . Neuropathy Brother     SOCIAL HISTORY: Social History   Socioeconomic History  . Marital status: Married    Spouse name: Merge  . Number of children: 3  . Years of education: 70  . Highest education level: Not on file  Social Needs  . Financial resource strain: Not on file  . Food insecurity - worry: Not on file  . Food insecurity - inability: Not on file  . Transportation needs - medical: Not on file  . Transportation needs - non-medical: Not on file  Occupational History  . Occupation: Retired  Tobacco Use  . Smoking status: Former Smoker    Packs/day: 2.00    Years: 15.00    Pack years: 30.00    Types: Cigarettes    Last attempt to quit: 04/24/1984    Years since quitting: 33.3  . Smokeless tobacco: Never Used  Substance and Sexual Activity  . Alcohol use: Yes    Comment: occ  . Drug use: No  . Sexual activity: Not on file  Other Topics Concern  . Not on file  Social History Narrative   Patient lives at home Merge his wife.    Patient has 3  adult children and 1 step son.    Patient has a high school education.    Patient is retired.    [employer]      PHYSICAL EXAM  Vitals:   09/01/17 1023  BP: 134/86  Pulse: 85  Weight: 225 lb (102.1 kg)   Body mass index is 32.28 kg/m. General: The patient is alert and cooperative at the time of the examination. The patient is minimally obese. Skin: No significant peripheral edema is noted. Neurologic Exam Mental status: The patient is oriented x 3. Cranial nerves: Facial symmetry is present. Speech is normal, no aphasia or dysarthria is noted. Extraocular movements are full. Visual  fields are full. Motor: The patient has good strength in all 4 extremities. The patient is able walk on heels and toes bilaterally. Sensory examination: There is a stocking pattern pinprick sensory deficit up to the knees bilaterally. Vibratory decreased at the toes and ankles. Coordination: The patient has good finger-nose-finger and heel-to-shin bilaterally. Gait and station: The patient has a normal gait. Tandem gait is normal. Romberg is negative. No drift is seen. Reflexes: Deep tendon reflexes are symmetric, but are depressed. DIAGNOSTIC DATA (LABS, IMAGING, TESTING) -  ASSESSMENT AND PLAN 67 y.o. year old male has a past medical history of Neuropathy;  here to follow-up. His neuropathy symptoms are  stable. History of chronic back pain with right L4-L5 laminectomy in March 2016 by Dr. Venetia MaxonStern  Continue Neurontin 800mg  4 times daily will refill Continue Neurontin 100mg  caps with 800mg  dose will refill Continue Lyrica will refill Continue Imipramine at current dose Continue Zonegran at current dose F/U in 1 year Nilda RiggsNancy Carolyn Tyrah Broers, [facility] AntonioGNP, [facility] PabloBC, APRN  [facility] [address], [address] [place], [place] [id number] [phone]

## 2017-09-01 ENCOUNTER — Other Ambulatory Visit: Payer: Self-pay | Admitting: Nurse Practitioner

## 2017-09-01 ENCOUNTER — Encounter: Payer: Self-pay | Admitting: Nurse Practitioner

## 2017-09-01 ENCOUNTER — Ambulatory Visit (INDEPENDENT_AMBULATORY_CARE_PROVIDER_SITE_OTHER): Payer: 59 | Admitting: Nurse Practitioner

## 2017-09-01 VITALS — BP 134/86 | HR 85 | Wt 225.0 lb

## 2017-09-01 DIAGNOSIS — G609 Hereditary and idiopathic neuropathy, unspecified: Secondary | ICD-10-CM | POA: Diagnosis not present

## 2017-09-01 MED ORDER — GABAPENTIN 100 MG PO CAPS: ORAL_CAPSULE | ORAL | 3 refills | Status: DC

## 2017-09-01 MED ORDER — PREGABALIN 100 MG PO CAPS: 100.0000 mg | ORAL_CAPSULE | Freq: Two times a day (BID) | ORAL | 1 refills | Status: DC

## 2017-09-01 MED ORDER — IMIPRAMINE HCL 25 MG PO TABS: ORAL_TABLET | ORAL | 3 refills | Status: DC

## 2017-09-01 MED ORDER — ZONISAMIDE 100 MG PO CAPS: ORAL_CAPSULE | ORAL | 3 refills | Status: DC

## 2017-09-01 MED ORDER — GABAPENTIN 800 MG PO TABS: 800.0000 mg | ORAL_TABLET | Freq: Four times a day (QID) | ORAL | 3 refills | Status: DC

## 2017-09-01 NOTE — Progress Notes (Signed)
Ly[facility]

## 2017-09-01 NOTE — Progress Notes (Signed)
I have read the note, and I agree with the clinical assessment and plan.  Alana Dayton K Kasha Howeth   

## 2017-09-01 NOTE — Patient Instructions (Signed)
Continue Neurontin 800mg  4 times daily will refill Continue Neurontin 100mg  caps with 800mg  dose will refill Continue Lyrica will refill Continue Imipramine at current dose Continue Zonegran at current dose Recommend walking every day for exercise F/U in 1 year

## 2017-09-21 MED FILL — TAMSULOSIN HCL 0.4 MG CAP: 0.4 | 30 days supply | Qty: 60 | Fill #6

## 2017-09-23 ENCOUNTER — Telehealth: Payer: Self-pay | Admitting: *Deleted

## 2017-09-23 MED FILL — LYRICA 100 MG CAPSULE: 100 | 90 days supply | Qty: 180 | Fill #0

## 2017-09-23 NOTE — Telephone Encounter (Signed)
Completed Lyrica PA on Cover My Meds  (Key: [id number])  Determination expected via fax within 1 to 5 business days.

## 2017-09-24 NOTE — Telephone Encounter (Signed)
We received a letter from [employer] stating that this medication does not require a PA b/c it is a covered benefit. Ref #: [id number]

## 2017-10-04 MED FILL — GABAPENTIN 800 MG TABLET: 800 | 90 days supply | Qty: 360 | Fill #0

## 2017-10-04 MED FILL — ATORVASTATIN 20 MG TABLET: 20 | 90 days supply | Qty: 90 | Fill #3

## 2017-10-04 MED FILL — FINASTERIDE 5 MG TABLET: 5 | 30 days supply | Qty: 30 | Fill #8

## 2017-10-04 MED FILL — GABAPENTIN 100 MG CAP: 100 | 90 days supply | Qty: 360 | Fill #0

## 2017-10-20 ENCOUNTER — Other Ambulatory Visit: Payer: Self-pay | Admitting: Nurse Practitioner

## 2017-10-20 MED FILL — TAMSULOSIN HCL 0.4 MG CAP: 0.4 | 30 days supply | Qty: 60 | Fill #7

## 2017-10-26 MED FILL — IMIPRAMINE HCL 50 MG TABLET: 50 | 90 days supply | Qty: 270 | Fill #0

## 2017-11-02 MED FILL — FINASTERIDE 5 MG TABLET: 5 | 30 days supply | Qty: 30 | Fill #9

## 2017-11-15 MED FILL — ZONISAMIDE 100 MG CAPSULE: 100 | 90 days supply | Qty: 270 | Fill #0

## 2017-11-18 MED FILL — TAMSULOSIN HCL 0.4 MG CAP: 0.4 | 30 days supply | Qty: 60 | Fill #8

## 2017-12-08 MED FILL — FINASTERIDE 5 MG TABLET: 5 | 30 days supply | Qty: 30 | Fill #10

## 2017-12-21 MED FILL — LYRICA 100 MG CAPSULE: 100 | 90 days supply | Qty: 180 | Fill #1

## 2017-12-21 MED FILL — TAMSULOSIN HCL 0.4 MG CAP: 0.4 | 30 days supply | Qty: 60 | Fill #9

## 2017-12-24 DIAGNOSIS — Z86010 Personal history of colonic polyps: Secondary | ICD-10-CM | POA: Diagnosis not present

## 2017-12-24 DIAGNOSIS — R0789 Other chest pain: Secondary | ICD-10-CM | POA: Diagnosis not present

## 2017-12-24 DIAGNOSIS — E7800 Pure hypercholesterolemia, unspecified: Secondary | ICD-10-CM | POA: Diagnosis not present

## 2017-12-24 DIAGNOSIS — Z0000 Encounter for general adult medical examination without abnormal findings: Secondary | ICD-10-CM | POA: Diagnosis not present

## 2017-12-24 DIAGNOSIS — G603 Idiopathic progressive neuropathy: Secondary | ICD-10-CM | POA: Diagnosis not present

## 2017-12-24 DIAGNOSIS — E669 Obesity, unspecified: Secondary | ICD-10-CM | POA: Diagnosis not present

## 2017-12-24 DIAGNOSIS — J301 Allergic rhinitis due to pollen: Secondary | ICD-10-CM | POA: Diagnosis not present

## 2017-12-24 DIAGNOSIS — Z23 Encounter for immunization: Secondary | ICD-10-CM | POA: Diagnosis not present

## 2017-12-24 DIAGNOSIS — N401 Enlarged prostate with lower urinary tract symptoms: Secondary | ICD-10-CM | POA: Diagnosis not present

## 2017-12-28 MED FILL — GABAPENTIN 800 MG TABLET: 800 | 90 days supply | Qty: 360 | Fill #1

## 2017-12-28 MED FILL — GABAPENTIN 100 MG CAP: 100 | 90 days supply | Qty: 360 | Fill #1

## 2018-01-03 MED FILL — FINASTERIDE 5 MG TABLET: 5 | 30 days supply | Qty: 30 | Fill #11

## 2018-01-21 MED FILL — TAMSULOSIN HCL 0.4 MG CAP: 0.4 | 30 days supply | Qty: 60 | Fill #10

## 2018-01-28 MED FILL — ATORVASTATIN 20 MG TABLET: 20 | 90 days supply | Qty: 90 | Fill #0

## 2018-01-28 MED FILL — IMIPRAMINE HCL 50 MG TABLET: 50 | 90 days supply | Qty: 270 | Fill #1

## 2018-01-31 MED FILL — FINASTERIDE 5 MG TABLET: 5 | 30 days supply | Qty: 30 | Fill #0

## 2018-02-23 MED FILL — TAMSULOSIN HCL 0.4 MG CAP: 0.4 | 30 days supply | Qty: 60 | Fill #11

## 2018-02-23 MED FILL — ZONISAMIDE 100 MG CAPSULE: 100 | 90 days supply | Qty: 270 | Fill #1

## 2018-03-10 MED FILL — FINASTERIDE 5 MG TABLET: 5 | 30 days supply | Qty: 30 | Fill #1

## 2018-03-18 ENCOUNTER — Other Ambulatory Visit: Payer: Self-pay | Admitting: Nurse Practitioner

## 2018-03-22 MED FILL — PREGABALIN 100 MG CAPS: 100 | 90 days supply | Qty: 180 | Fill #0

## 2018-03-22 NOTE — Telephone Encounter (Signed)
Prescription faxed to [facility] [phone] with confirmation.

## 2018-04-06 MED FILL — FINASTERIDE 5 MG TABLET: 5 | 30 days supply | Qty: 30 | Fill #2

## 2018-04-06 MED FILL — GABAPENTIN 100 MG CAP: 100 | 90 days supply | Qty: 360 | Fill #2

## 2018-04-06 MED FILL — GABAPENTIN 800 MG TABLET: 800 | 90 days supply | Qty: 360 | Fill #2

## 2018-05-05 MED FILL — FINASTERIDE 5 MG TABLET: 5 | 30 days supply | Qty: 30 | Fill #3

## 2018-05-05 MED FILL — ATORVASTATIN CALCIUM 20 MG: 20 | 90 days supply | Qty: 90 | Fill #1

## 2018-05-05 MED FILL — IMIPRAMINE HCL 50 MG TABLET: 50 | 90 days supply | Qty: 270 | Fill #2

## 2018-05-30 MED FILL — ZONISAMIDE 100 MG CAPSULE: 100 | 90 days supply | Qty: 270 | Fill #2

## 2018-06-29 MED FILL — PREGABALIN 100 MG CAPS: 100 | 90 days supply | Qty: 180 | Fill #1

## 2018-07-04 MED FILL — GABAPENTIN 100 MG CAPS: 100 | 90 days supply | Qty: 360 | Fill #3

## 2018-07-04 MED FILL — GABAPENTIN 800 MG TABLET: 800 | 90 days supply | Qty: 360 | Fill #3

## 2018-08-11 MED FILL — IMIPRAMINE HCL 50 MG TABLET: 50 | 90 days supply | Qty: 270 | Fill #3

## 2018-08-11 MED FILL — ATORVASTATIN CALCIUM 20 MG: 20 | 90 days supply | Qty: 90 | Fill #2

## 2018-08-29 MED FILL — ZONISAMIDE 100 MG CAPSULE: 100 | 90 days supply | Qty: 270 | Fill #3

## 2018-09-01 NOTE — Progress Notes (Signed)
[facility]  PATIENT: Arthur FerronRoger A Rendleman DOB: 02/16/1951   REASON FOR VISIT: Follow-up for peripheral neuropathy, chronic low back pain HISTORY FROM: Patient    HISTORY OF PRESENT ILLNESS:[name] is a 68 year old right-handed white male with peripheral neuropathy that is felt to be inherited, the patient's brother also has a neuropathy. The patient denies any significant balance issues. He denies any falls or stumbles. He also has chronic low back pain, had right L4-L5 laminectomy March 24th, 2016 by Dr. Venetia MaxonStern . The patient is on gabapentin and Lyrica combination, and this seems to work well for him. He has attempted to  taper down on gabapentin without success . He indicates that he sleeps fairly well at night. He returns to this office for further evaluation.  He has no new neurologic complaints   REVIEW OF SYSTEMS: Full 14 system review of systems performed and notable only for those listed, all others are neg:  Constitutional: neg  Cardiovascular: neg Ear/Nose/Throat: Ringing in the ears, hearing loss Skin: neg Eyes: neg Respiratory: neg Gastroitestinal: neg  Hematology/Lymphatic: neg  Endocrine: neg Musculoskeletal: Back pain  Allergy/Immunology: Allergies  Neurological: neg Psychiatric: neg Sleep : neg   ALLERGIES: Allergies  Allergen Reactions  . Eggs Or Egg-Derived Products     intolerance  . Lactose Intolerance (Gi)     May have skim milk    HOME MEDICATIONS: Outpatient Medications Prior to Visit  Medication Sig Dispense Refill  . aspirin EC 81 MG tablet Take 162 mg by mouth daily.     Marland Kitchen. atorvastatin (LIPITOR) 10 MG tablet Take 10 mg by mouth daily.    Marland Kitchen. b complex vitamins tablet Take 1 tablet by mouth daily.    . diazepam (VALIUM) 5 MG tablet Take 1 tablet (5 mg total) by mouth every 6 (six) hours as needed for muscle spasms. 30 tablet 0  . finasteride (PROSCAR) 5 MG tablet Take 5 mg by mouth daily.  11  . gabapentin (NEURONTIN) 100  MG capsule TAKE 1 CAPSULE BY MOUTH 4 TIMES DAILY (TAKE WITH 800 MG TABLETS) 360 capsule 3  . gabapentin (NEURONTIN) 800 MG tablet Take 1 tablet (800 mg total) by mouth 4 (four) times daily. Take with 100mg  caps 360 tablet 3  . HYDROmorphone (DILAUDID) 2 MG tablet Take 2-4 mg by mouth every 6 (six) hours as needed for severe pain.    Marland Kitchen. imipramine (TOFRANIL) 25 MG tablet TAKE 6 TABLETS BY MOUTH ONCE DAILY AT BEDTIME 540 tablet 3  . LYRICA 100 MG capsule TAKE 1 CAPSULE BY MOUTH TWICE DAILY 270 capsule 1  . meloxicam (MOBIC) 15 MG tablet   0  . Omega-3 Fatty Acids (FISH OIL OMEGA-3 PO) Take 1 capsule by mouth daily.    Marland Kitchen. senna-docusate (SENNA S) 8.6-50 MG per tablet Take 4 tablets by mouth at bedtime.     Marland Kitchen. Specialty Vitamins Products (BIOTIN PLUS KERATIN) 10000-100 MCG-MG TABS Take by mouth.    Marland Kitchen. VIAGRA 50 MG tablet as needed.   11  . zonisamide (ZONEGRAN) 100 MG capsule TAKE 3 CAPSULES BY MOUTH ONCE DAILY AT BEDTIME 270 capsule 3  . mirabegron ER (MYRBETRIQ) 25 MG TB24 tablet Take 25 mg by mouth daily.    . tamsulosin (FLOMAX) 0.4 MG CAPS capsule Take 1 capsule (0.4 mg total) by mouth daily after supper. (Patient taking differently: Take 0.8 mg by mouth daily after supper. )     No facility-administered medications prior to visit.     PAST MEDICAL  HISTORY: Past Medical History:  Diagnosis Date  . Arthritis   . Bronchitis with asthma, acute April 2013  . Depression    denies  . Frequency of urination    URINATION AT NIGHT, PAINFUL SLOW STREAM   . GERD (gastroesophageal reflux disease)    occ  . Hyperlipidemia   . Neuromuscular disorder (HCC)    DIZZINESS   PERIPHERAL NEUROPATHY   . Neuropathy   . No pertinent past medical history    RINGING RIGHT EAR  . Recurrent upper respiratory infection (URI)    SOB CURRENT COLD  . Shortness of breath    d/t "cold" 12/02/11    PAST SURGICAL HISTORY: Past Surgical History:  Procedure Laterality Date  . FINGER SURGERY     RIGHT MIDDLE FINGER   . HERNIA REPAIR     UMBILICAL HERNIA 2011  . LUMBAR LAMINECTOMY/DECOMPRESSION MICRODISCECTOMY  12/22/2011   Procedure: LUMBAR LAMINECTOMY/DECOMPRESSION MICRODISCECTOMY 1 LEVEL;  Surgeon: Maeola HarmanJoseph Stern, MD;  Location: [facility];  Service: Neurosurgery;  Laterality: N/A;  Thoracic ten-eleven laminectomy  . LUMBAR LAMINECTOMY/DECOMPRESSION MICRODISCECTOMY Right 05/01/2014   Procedure: Right Lumbar three-four Microdiskectomy;  Surgeon: Maeola HarmanJoseph Stern, MD;  Location: [facility];  Service: Neurosurgery;  Laterality: Right;  . LUMBAR LAMINECTOMY/DECOMPRESSION MICRODISCECTOMY Right 11/08/2014   Procedure: Right L4-5 L5-S1 Laminectomy;  Surgeon: Maeola HarmanJoseph Stern, MD;  Location: [facility];  Service: Neurosurgery;  Laterality: Right;  Right L4-5 L5-S1 Laminectomy  . MULTIPLE TOOTH EXTRACTIONS      FAMILY HISTORY: Family History  Problem Relation Age of Onset  . Cancer Mother   . Stroke Father   . Neuropathy Brother     SOCIAL HISTORY: Social History   Socioeconomic History  . Marital status: Married    Spouse name: Merge  . Number of children: 3  . Years of education: 8312  . Highest education level: Not on file  Occupational History  . Occupation: Retired  Engineer, productionocial Needs  . Financial resource strain: Not on file  . Food insecurity:    Worry: Not on file    Inability: Not on file  . Transportation needs:    Medical: Not on file    Non-medical: Not on file  Tobacco Use  . Smoking status: Former Smoker    Packs/day: 2.00    Years: 15.00    Pack years: 30.00    Types: Cigarettes    Last attempt to quit: 04/24/1984    Years since quitting: 34.3  . Smokeless tobacco: Never Used  Substance and Sexual Activity  . Alcohol use: Yes    Comment: occ  . Drug use: No  . Sexual activity: Not on file  Lifestyle  . Physical activity:    Days per week: Not on file    Minutes per session: Not on file  . Stress: Not on file  Relationships  . Social connections:    Talks on phone: Not on file     Gets together: Not on file    Attends religious service: Not on file    Active member of club or organization: Not on file    Attends meetings of clubs or organizations: Not on file    Relationship status: Not on file  . Intimate partner violence:    Fear of current or ex partner: Not on file    Emotionally abused: Not on file    Physically abused: Not on file    Forced sexual activity: Not on file  Other Topics Concern  . Not on  file  Social History Narrative   Patient lives at home Merge his wife.    Patient has 3 adult children and 1 step son.    Patient has a high school education.    Patient is retired.    [employer] [employer]      PHYSICAL EXAM  Vitals:   09/02/18 1028  BP: 120/75  Pulse: 77  Weight: 224 lb 9.6 oz (101.9 kg)  Height: 5' 8.5" (1.74 m)   Body mass index is 33.65 kg/m. General: The patient is alert and cooperative at the time of the examination. The patient is  obese. Skin: No significant peripheral edema is noted. Neurologic Exam Mental status: The patient is oriented x 3. Cranial nerves: Facial symmetry is present. Speech is normal, no aphasia or dysarthria is noted. Extraocular movements are full. Visual fields are full. Motor: The patient has good strength in all 4 extremities. The patient is able walk on heels and toes bilaterally. Sensory examination: There is a stocking pattern pinprick sensory deficit up to the knees bilaterally. Vibratory decreased at the toes and ankles. Coordination: The patient has good finger-nose-finger and heel-to-shin bilaterally. Gait and station: The patient has a normal gait. Tandem gait is normal. Romberg is negative. No drift is seen. Reflexes: Deep tendon reflexes are symmetric, but are depressed. DIAGNOSTIC DATA (LABS, IMAGING, TESTING) -  ASSESSMENT AND PLAN 68 y.o. year old male has a past medical history of Neuropathy;  here to follow-up. His neuropathy symptoms are  stable. History of chronic back pain with right  L4-L5 laminectomy in March 2016 by Dr. Venetia Maxon  Continue Neurontin 800mg  4 times daily will refill Continue Neurontin 100mg  caps with 800mg  dose will refill Continue Lyrica will refill Continue Imipramine at current dose Continue Zonegran at current dose F/U in 1 year Nilda Riggs, [facility], [facility], APRN  [facility] [address] [place], [place] [address] [phone]

## 2018-09-02 ENCOUNTER — Encounter: Payer: Self-pay | Admitting: Nurse Practitioner

## 2018-09-02 ENCOUNTER — Ambulatory Visit (INDEPENDENT_AMBULATORY_CARE_PROVIDER_SITE_OTHER): Payer: 59 | Admitting: Nurse Practitioner

## 2018-09-02 VITALS — BP 120/75 | HR 77 | Ht 68.5 in | Wt 224.6 lb

## 2018-09-02 DIAGNOSIS — M545 Low back pain, unspecified: Secondary | ICD-10-CM

## 2018-09-02 DIAGNOSIS — M549 Dorsalgia, unspecified: Secondary | ICD-10-CM | POA: Insufficient documentation

## 2018-09-02 DIAGNOSIS — G609 Hereditary and idiopathic neuropathy, unspecified: Secondary | ICD-10-CM | POA: Diagnosis not present

## 2018-09-02 MED ORDER — IMIPRAMINE HCL 25 MG PO TABS: ORAL_TABLET | ORAL | 3 refills | Status: DC

## 2018-09-02 MED ORDER — GABAPENTIN 800 MG PO TABS: 800.0000 mg | ORAL_TABLET | Freq: Four times a day (QID) | ORAL | 3 refills | Status: DC

## 2018-09-02 MED ORDER — GABAPENTIN 100 MG PO CAPS: ORAL_CAPSULE | ORAL | 3 refills | Status: DC

## 2018-09-02 MED ORDER — ZONISAMIDE 100 MG PO CAPS: ORAL_CAPSULE | ORAL | 3 refills | Status: DC

## 2018-09-02 MED ORDER — PREGABALIN 100 MG PO CAPS: 100.0000 mg | ORAL_CAPSULE | Freq: Two times a day (BID) | ORAL | 1 refills | Status: DC

## 2018-09-02 NOTE — Patient Instructions (Signed)
Continue Neurontin 800mg  4 times daily will refill Continue Neurontin 100mg  caps with 800mg  dose will refill Continue Lyrica will refill Continue Imipramine at current dose Continue Zonegran at current dose F/U in 1 year

## 2018-09-02 NOTE — Progress Notes (Signed)
I have read the note, and I agree with the clinical assessment and plan.  Charles K Willis   

## 2018-09-02 NOTE — Progress Notes (Signed)
Lyrica refill Rx successfully faxed to [facility] outpt pharmacy.

## 2018-10-07 ENCOUNTER — Other Ambulatory Visit: Payer: Self-pay | Admitting: Nurse Practitioner

## 2018-10-07 MED FILL — GABAPENTIN 100 MG CAPSULE: 100 | 30 days supply | Qty: 120 | Fill #0 | Status: TO

## 2018-10-07 MED FILL — PREGABALIN 100 MG CAPS: 100 | 30 days supply | Qty: 60 | Fill #0 | Status: TO

## 2018-10-07 MED FILL — GABAPENTIN 800 MG TABLET: 800 | 30 days supply | Qty: 120 | Fill #0 | Status: TO

## 2018-11-09 MED FILL — IMIPRAMINE HCL 25 MG TABLET: 25 | 30 days supply | Qty: 180 | Fill #0

## 2018-11-09 MED FILL — GABAPENTIN 800 MG TABS: 800 | 30 days supply | Qty: 120 | Fill #0

## 2018-11-09 MED FILL — ATORVASTATIN 20 MG TABLET: 20 | 90 days supply | Qty: 90 | Fill #0

## 2018-11-09 MED FILL — PREGABALIN 100 MG CAPS: 100 | 30 days supply | Qty: 60 | Fill #0

## 2018-11-09 MED FILL — GABAPENTIN 100 MG CAPSULE: 100 | 30 days supply | Qty: 120 | Fill #0

## 2018-11-13 DIAGNOSIS — I214 Non-ST elevation (NSTEMI) myocardial infarction: Secondary | ICD-10-CM

## 2018-11-13 HISTORY — DX: Non-ST elevation (NSTEMI) myocardial infarction: I21.4

## 2018-11-14 ENCOUNTER — Inpatient Hospital Stay (HOSPITAL_COMMUNITY)
Admission: EM | Admit: 2018-11-14 | Discharge: 2018-11-17 | DRG: 246 | Disposition: A | Discharge status: Home / Self Care | Payer: 59 | Attending: Cardiology | Admitting: Cardiology

## 2018-11-14 ENCOUNTER — Encounter (HOSPITAL_COMMUNITY): Payer: Self-pay

## 2018-11-14 ENCOUNTER — Other Ambulatory Visit: Payer: Self-pay

## 2018-11-14 ENCOUNTER — Emergency Department (HOSPITAL_COMMUNITY): Payer: 59

## 2018-11-14 DIAGNOSIS — J45909 Unspecified asthma, uncomplicated: Secondary | ICD-10-CM | POA: Diagnosis present

## 2018-11-14 DIAGNOSIS — Z8249 Family history of ischemic heart disease and other diseases of the circulatory system: Secondary | ICD-10-CM | POA: Diagnosis not present

## 2018-11-14 DIAGNOSIS — Z7982 Long term (current) use of aspirin: Secondary | ICD-10-CM | POA: Diagnosis not present

## 2018-11-14 DIAGNOSIS — Z79899 Other long term (current) drug therapy: Secondary | ICD-10-CM

## 2018-11-14 DIAGNOSIS — I2510 Atherosclerotic heart disease of native coronary artery without angina pectoris: Secondary | ICD-10-CM | POA: Diagnosis not present

## 2018-11-14 DIAGNOSIS — R030 Elevated blood-pressure reading, without diagnosis of hypertension: Secondary | ICD-10-CM | POA: Diagnosis present

## 2018-11-14 DIAGNOSIS — E669 Obesity, unspecified: Secondary | ICD-10-CM

## 2018-11-14 DIAGNOSIS — Z791 Long term (current) use of non-steroidal anti-inflammatories (NSAID): Secondary | ICD-10-CM

## 2018-11-14 DIAGNOSIS — I10 Essential (primary) hypertension: Secondary | ICD-10-CM | POA: Diagnosis not present

## 2018-11-14 DIAGNOSIS — Z820 Family history of epilepsy and other diseases of the nervous system: Secondary | ICD-10-CM | POA: Diagnosis not present

## 2018-11-14 DIAGNOSIS — Z9861 Coronary angioplasty status: Secondary | ICD-10-CM | POA: Diagnosis not present

## 2018-11-14 DIAGNOSIS — E785 Hyperlipidemia, unspecified: Secondary | ICD-10-CM | POA: Diagnosis present

## 2018-11-14 DIAGNOSIS — M48062 Spinal stenosis, lumbar region with neurogenic claudication: Secondary | ICD-10-CM | POA: Diagnosis present

## 2018-11-14 DIAGNOSIS — E7800 Pure hypercholesterolemia, unspecified: Secondary | ICD-10-CM | POA: Diagnosis not present

## 2018-11-14 DIAGNOSIS — I4519 Other right bundle-branch block: Secondary | ICD-10-CM | POA: Diagnosis present

## 2018-11-14 DIAGNOSIS — Z823 Family history of stroke: Secondary | ICD-10-CM

## 2018-11-14 DIAGNOSIS — Z955 Presence of coronary angioplasty implant and graft: Secondary | ICD-10-CM

## 2018-11-14 DIAGNOSIS — I25110 Atherosclerotic heart disease of native coronary artery with unstable angina pectoris: Secondary | ICD-10-CM | POA: Diagnosis present

## 2018-11-14 DIAGNOSIS — Z87891 Personal history of nicotine dependence: Secondary | ICD-10-CM

## 2018-11-14 DIAGNOSIS — R079 Chest pain, unspecified: Secondary | ICD-10-CM | POA: Diagnosis not present

## 2018-11-14 DIAGNOSIS — I237 Postinfarction angina: Secondary | ICD-10-CM | POA: Diagnosis present

## 2018-11-14 DIAGNOSIS — I214 Non-ST elevation (NSTEMI) myocardial infarction: Principal | ICD-10-CM | POA: Diagnosis present

## 2018-11-14 DIAGNOSIS — R05 Cough: Secondary | ICD-10-CM | POA: Diagnosis not present

## 2018-11-14 DIAGNOSIS — R0789 Other chest pain: Secondary | ICD-10-CM | POA: Diagnosis present

## 2018-11-14 HISTORY — DX: Non-ST elevation (NSTEMI) myocardial infarction: I21.4

## 2018-11-14 HISTORY — DX: Coronary angioplasty status: Z98.61

## 2018-11-14 HISTORY — DX: Atherosclerotic heart disease of native coronary artery without angina pectoris: I25.10

## 2018-11-14 LAB — COMPREHENSIVE METABOLIC PANEL
ALT: 48 U/L — ABNORMAL HIGH (ref 0–44)
AST: 57 U/L — ABNORMAL HIGH (ref 15–41)
Albumin: 4 g/dL (ref 3.5–5.0)
Alkaline Phosphatase: 156 U/L — ABNORMAL HIGH (ref 38–126)
Anion gap: 7 (ref 5–15)
BUN: 17 mg/dL (ref 8–23)
CO2: 24 mmol/L (ref 22–32)
Calcium: 9.3 mg/dL (ref 8.9–10.3)
Chloride: 107 mmol/L (ref 98–111)
Creatinine, Ser: 1.13 mg/dL (ref 0.61–1.24)
GFR calc Af Amer: >60 mL/min (ref >60)
GFR calc non Af Amer: >60 mL/min (ref >60)
Glucose, Bld: 127 mg/dL — ABNORMAL HIGH (ref 70–99)
Potassium: 3.7 mmol/L (ref 3.5–5.1)
Sodium: 138 mmol/L (ref 135–145)
Total Bilirubin: 1.4 mg/dL — ABNORMAL HIGH (ref 0.3–1.2)
Total Protein: 7 g/dL (ref 6.5–8.1)

## 2018-11-14 LAB — CBC WITH DIFFERENTIAL/PLATELET
Abs Immature Granulocytes: 0.03 10*3/uL (ref 0.00–0.07)
Basophils Absolute: 0.1 10*3/uL (ref 0.0–0.1)
Basophils Relative: 1 %
Eosinophils Absolute: 0.4 10*3/uL (ref 0.0–0.5)
Eosinophils Relative: 4 %
HCT: 44.4 % (ref 39.0–52.0)
Hemoglobin: 14.6 g/dL (ref 13.0–17.0)
Immature Granulocytes: 0 %
Lymphocytes Relative: 25 %
Lymphs Abs: 2.5 10*3/uL (ref 0.7–4.0)
MCH: 30.5 pg (ref 26.0–34.0)
MCHC: 32.9 g/dL (ref 30.0–36.0)
MCV: 92.7 fL (ref 80.0–100.0)
Monocytes Absolute: 1.2 10*3/uL — ABNORMAL HIGH (ref 0.1–1.0)
Monocytes Relative: 12 %
Neutro Abs: 5.7 10*3/uL (ref 1.7–7.7)
Neutrophils Relative %: 58 %
Platelets: 226 10*3/uL (ref 150–400)
RBC: 4.79 MIL/uL (ref 4.22–5.81)
RDW: 13.2 % (ref 11.5–15.5)
WBC: 9.8 10*3/uL (ref 4.0–10.5)
nRBC: 0 % (ref 0.0–0.2)

## 2018-11-14 LAB — TROPONIN I
Troponin I: 4.68 ng/mL (ref <0.03)
Troponin I: 5.15 ng/mL (ref <0.03)

## 2018-11-14 LAB — MRSA PCR SCREENING: MRSA by PCR: NEGATIVE

## 2018-11-14 MED ORDER — HEPARIN (PORCINE) 25000 UT/250ML-% IV SOLN
1250.0000 [IU]/h | INTRAVENOUS | Status: DC
  Filled 2018-11-14: qty 250

## 2018-11-14 MED ORDER — ACETAMINOPHEN 325 MG PO TABS: 650.0000 mg | ORAL_TABLET | ORAL | Status: DC | PRN

## 2018-11-14 MED ORDER — HEPARIN BOLUS VIA INFUSION
4000.0000 [IU] | Freq: Once | INTRAVENOUS | Status: AC
  Administered 2018-11-14: 4000 [IU] via INTRAVENOUS
  Filled 2018-11-14: qty 4000

## 2018-11-14 MED ORDER — ATORVASTATIN CALCIUM 80 MG PO TABS
80.0000 mg | ORAL_TABLET | Freq: Every day | ORAL | Status: DC
  Administered 2018-11-15 – 2018-11-16 (×2): 80 mg via ORAL
  Filled 2018-11-14 (×2): qty 1

## 2018-11-14 MED ORDER — HYDROMORPHONE HCL 2 MG PO TABS: 2.0000 mg | ORAL_TABLET | Freq: Four times a day (QID) | ORAL | Status: DC | PRN

## 2018-11-14 MED ORDER — SODIUM CHLORIDE 0.9 % WEIGHT BASED INFUSION
1.0000 mL/kg/h | INTRAVENOUS | Status: DC
  Administered 2018-11-15: 0.752 mL/kg/h via INTRAVENOUS

## 2018-11-14 MED ORDER — FINASTERIDE 5 MG PO TABS
5.0000 mg | ORAL_TABLET | Freq: Every day | ORAL | Status: DC
  Administered 2018-11-15 – 2018-11-17 (×2): 5 mg via ORAL
  Filled 2018-11-14 (×2): qty 1

## 2018-11-14 MED ORDER — SODIUM CHLORIDE 0.9% FLUSH: 3.0000 mL | INTRAVENOUS | Status: DC | PRN

## 2018-11-14 MED ORDER — NITROGLYCERIN 0.4 MG SL SUBL: 0.4000 mg | SUBLINGUAL_TABLET | SUBLINGUAL | Status: DC | PRN

## 2018-11-14 MED ORDER — ASPIRIN 81 MG PO CHEW
81.0000 mg | CHEWABLE_TABLET | ORAL | Status: AC
  Administered 2018-11-15: 81 mg via ORAL
  Filled 2018-11-14: qty 1

## 2018-11-14 MED ORDER — ASPIRIN 81 MG PO CHEW
324.0000 mg | CHEWABLE_TABLET | ORAL | Status: AC
  Administered 2018-11-14: 324 mg via ORAL
  Filled 2018-11-14: qty 4

## 2018-11-14 MED ORDER — SODIUM CHLORIDE 0.9 % WEIGHT BASED INFUSION: 3.0000 mL/kg/h | INTRAVENOUS | Status: DC

## 2018-11-14 MED ORDER — ONDANSETRON HCL 4 MG/2ML IJ SOLN: 4.0000 mg | Freq: Four times a day (QID) | INTRAMUSCULAR | Status: DC | PRN

## 2018-11-14 MED ORDER — ASPIRIN EC 81 MG PO TBEC: 162.0000 mg | DELAYED_RELEASE_TABLET | Freq: Every day | ORAL | Status: DC

## 2018-11-14 MED ORDER — DIAZEPAM 5 MG PO TABS: 5.0000 mg | ORAL_TABLET | Freq: Four times a day (QID) | ORAL | Status: DC | PRN

## 2018-11-14 MED ORDER — ASPIRIN 300 MG RE SUPP
300.0000 mg | RECTAL | Status: AC
  Filled 2018-11-14: qty 1

## 2018-11-14 MED ORDER — PREGABALIN 100 MG PO CAPS
100.0000 mg | ORAL_CAPSULE | Freq: Two times a day (BID) | ORAL | Status: DC
  Administered 2018-11-14 – 2018-11-17 (×5): 100 mg via ORAL
  Filled 2018-11-14 (×5): qty 1

## 2018-11-14 MED ORDER — SODIUM CHLORIDE 0.9% FLUSH: 3.0000 mL | Freq: Two times a day (BID) | INTRAVENOUS | Status: DC

## 2018-11-14 MED ORDER — GABAPENTIN 400 MG PO CAPS
800.0000 mg | ORAL_CAPSULE | Freq: Four times a day (QID) | ORAL | Status: DC
  Administered 2018-11-14 – 2018-11-17 (×8): 800 mg via ORAL
  Filled 2018-11-14 (×8): qty 2

## 2018-11-14 MED ORDER — SODIUM CHLORIDE 0.9 % IV SOLN: 250.0000 mL | INTRAVENOUS | Status: DC | PRN

## 2018-11-14 NOTE — Progress Notes (Signed)
Cardiology Admission History and Physical:   Patient ID: Arthur Smith MRN: [id number]; DOB: 1951/07/04   Admission date: 11/14/2018  Primary Care Provider: Kirby Funk, MD Primary Cardiologist: No primary care provider on file.  New Primary Electrophysiologist:  None   Chief Complaint:  Angina with minimal activity  Patient Profile:   Arthur Smith is a 68 y.o. male with a history of treated hyperlipidemia and a family history of premature onset of CAD, presenting with multiple episodes of chest pain with minimal activity  History of Present Illness:   Arthur Smith was at rest yesterday when he experienced a sudden sharp precordial pain radiating upwards towards his left clavicle, left side of the neck and into his lower jaw.  The pain was so intense that he could not tolerate his lower dentures and had to take them out.  Symptoms lasted for about an hour and resolve spontaneously.  He continued to feel uncomfortable throughout the evening but was able to sleep through the night.  This morning he began experiencing the chest discomfort again with the slightest physical activity.  His wife, Arthur Smith, who is an Charity fundraiser at [employer], convinced him to come to the hospital.  His ECG is unremarkable and shows sinus rhythm without repolarization abnormalities, but the troponin I was 4.68.  He is currently asymptomatic at rest.  Eyes dyspnea, diaphoresis, palpitations, dizziness or syncope, focal neurological complaints or claudication or edema.  He has chronic problems with lumbar stenosis and secondary neuropathy for which she takes a variety of different medications and takes atorvastatin for hyperlipidemia.  He denies any recent fever, chills, rhinorrhea, cough or sneezing, contact with patients with known viral illness or any recent travel.  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   Past Medical History:  Diagnosis Date  . Arthritis   . Bronchitis with asthma,  acute April 2013  . Depression    denies  . Frequency of urination    URINATION AT NIGHT, PAINFUL SLOW STREAM   . GERD (gastroesophageal reflux disease)    occ  . Hyperlipidemia   . Neuromuscular disorder (HCC)    DIZZINESS   PERIPHERAL NEUROPATHY   . Neuropathy   . No pertinent past medical history    RINGING RIGHT EAR  . Recurrent upper respiratory infection (URI)    SOB CURRENT COLD  . Shortness of breath    d/t "cold" 12/02/11    Past Surgical History:  Procedure Laterality Date  . FINGER SURGERY     RIGHT MIDDLE FINGER  . HERNIA REPAIR     UMBILICAL HERNIA 2011  . LUMBAR LAMINECTOMY/DECOMPRESSION MICRODISCECTOMY  12/22/2011   Procedure: LUMBAR LAMINECTOMY/DECOMPRESSION MICRODISCECTOMY 1 LEVEL;  Surgeon: Maeola Harman, MD;  Location: [facility];  Service: Neurosurgery;  Laterality: N/A;  Thoracic ten-eleven laminectomy  . LUMBAR LAMINECTOMY/DECOMPRESSION MICRODISCECTOMY Right 05/01/2014   Procedure: Right Lumbar three-four Microdiskectomy;  Surgeon: Maeola Harman, MD;  Location: [facility];  Service: Neurosurgery;  Laterality: Right;  . LUMBAR LAMINECTOMY/DECOMPRESSION MICRODISCECTOMY Right 11/08/2014   Procedure: Right L4-5 L5-S1 Laminectomy;  Surgeon: Maeola Harman, MD;  Location: [facility];  Service: Neurosurgery;  Laterality: Right;  Right L4-5 L5-S1 Laminectomy  . MULTIPLE TOOTH EXTRACTIONS       Medications Prior to Admission: Prior to Admission medications   Medication Sig Start Date End Date Taking? Authorizing Provider  aspirin EC 81 MG tablet Take 162 mg by mouth daily.     [provider]  atorvastatin (LIPITOR) 10 MG tablet Take 10 mg by mouth daily.    [provider]  b complex vitamins tablet Take 1 tablet by mouth daily.    [provider]  diazepam (VALIUM) 5 MG tablet Take 1 tablet (5 mg total) by mouth every 6 (six) hours as needed for muscle spasms. 11/09/14   Barnett Abu, MD  finasteride (PROSCAR) 5 MG tablet Take 5 mg by  mouth daily. 08/25/17   [provider]  gabapentin (NEURONTIN) 100 MG capsule TAKE 1 CAPSULE BY MOUTH 4 TIMES DAILY (TAKE WITH 800 MG TABLETS) 09/02/18   Nilda Riggs, NP  gabapentin (NEURONTIN) 800 MG tablet Take 1 tablet (800 mg total) by mouth 4 (four) times daily. Take with 100mg  caps 09/02/18   Nilda Riggs, NP  HYDROmorphone (DILAUDID) 2 MG tablet Take 2-4 mg by mouth every 6 (six) hours as needed for severe pain.    [provider]  imipramine (TOFRANIL) 25 MG tablet TAKE 6 TABLETS BY MOUTH ONCE DAILY AT BEDTIME 09/02/18   Nilda Riggs, NP  meloxicam [facility]) 15 MG tablet  11/15/15   [provider]  Omega-3 Fatty Acids (FISH OIL OMEGA-3 PO) Take 1 capsule by mouth daily.    [provider]  pregabalin (LYRICA) 100 MG capsule Take 1 capsule (100 mg total) by mouth 2 (two) times daily. 09/02/18   Nilda Riggs, NP  senna-docusate (SENNA S) 8.6-50 MG per tablet Take 4 tablets by mouth at bedtime.     [provider]  Specialty Vitamins Products (BIOTIN PLUS KERATIN) 10000-100 MCG-MG TABS Take by mouth.    [provider]  VIAGRA 50 MG tablet as needed.  12/26/15   [provider]  zonisamide (ZONEGRAN) 100 MG capsule TAKE 3 CAPSULES BY MOUTH ONCE DAILY AT BEDTIME 09/02/18   Nilda Riggs, NP     Allergies:    Allergies  Allergen Reactions  . Eggs Or Egg-Derived Products     intolerance  . Lactose Intolerance (Gi)     May have skim milk    Social History:   Social History   Socioeconomic History  . Marital status: Married    Spouse name: Merge  . Number of children: 3  . Years of education: 72  . Highest education level: Not on file  Occupational History  . Occupation: Retired  Engineer, production  . Financial resource strain: Not on file  . Food insecurity:    Worry: Not on file    Inability: Not on file  . Transportation needs:    Medical: Not on file    Non-medical: Not on file   Tobacco Use  . Smoking status: Former Smoker    Packs/day: 2.00    Years: 15.00    Pack years: 30.00    Types: Cigarettes    Last attempt to quit: 04/24/1984    Years since quitting: 34.5  . Smokeless tobacco: Never Used  Substance and Sexual Activity  . Alcohol use: Yes    Comment: occ  . Drug use: No  . Sexual activity: Not on file  Lifestyle  . Physical activity:    Days per week: Not on file    Minutes per session: Not on file  . Stress: Not on file  Relationships  . Social connections:    Talks on phone: Not on file    Gets together: Not on file    Attends religious service: Not on file  Active member of club or organization: Not on file    Attends meetings of clubs or organizations: Not on file    Relationship status: Not on file  . Intimate partner violence:    Fear of current or ex partner: Not on file    Emotionally abused: Not on file    Physically abused: Not on file    Forced sexual activity: Not on file  Other Topics Concern  . Not on file  Social History Narrative   Patient lives at home Merge his wife.    Patient has 3 adult children and 1 step son.    Patient has a high school education.    Patient is retired.    [place] [employer]     Family History: His mother had bypass surgery in her 30s The patient's family history includes Cancer in his mother; Neuropathy in his brother; Stroke in his father.    ROS:  Please see the history of present illness.  All other ROS reviewed and negative.     Physical Exam/Data:   Vitals:   11/14/18 1715 11/14/18 1717 11/14/18 1730  BP:  (!) 134/96 126/89  Pulse:  76 75  Resp:  18 14  Temp:  97.8 F (36.6 C)   TempSrc:  Oral   SpO2:  99% 98%  Weight: 99.8 kg    Height: 5\' 8"  (1.727 m)     No intake or output data in the 24 hours ending 11/14/18 1955 Last 3 Weights 11/14/2018 09/02/2018 09/01/2017  Weight (lbs) 220 lb 224 lb 9.6 oz 225 lb  Weight (kg) 99.791 kg 101.878 kg 102.059 kg     Body mass index is 33.45  kg/m.  General:  Well nourished, well developed, in no acute distress HEENT: normal Lymph: no adenopathy Neck: no JVD Endocrine:  No thryomegaly Vascular: No carotid bruits; FA pulses 2+ bilaterally without bruits  Cardiac:  normal S1, S2; RRR; no murmur  Lungs:  clear to auscultation bilaterally, no wheezing, rhonchi or rales  Abd: soft, nontender, no hepatomegaly  Ext: no edema Musculoskeletal:  No deformities, BUE and BLE strength normal and equal Skin: warm and dry  Neuro:  CNs 2-12 intact, no focal abnormalities noted Psych:  Normal affect    EKG:  The ECG that was done today was personally reviewed and demonstrates sinus rhythm, no repolarization abnormalities  Relevant CV Studies: Not applicable  Laboratory Data:  Chemistry Recent Labs  Lab 11/14/18 1736  NA 138  K 3.7  CL 107  CO2 24  GLUCOSE 127*  BUN 17  CREATININE 1.13  CALCIUM 9.3  GFRNONAA >60  GFRAA >60  ANIONGAP 7    Recent Labs  Lab 11/14/18 1736  PROT 7.0  ALBUMIN 4.0  AST 57*  ALT 48*  ALKPHOS 156*  BILITOT 1.4*   Hematology Recent Labs  Lab 11/14/18 1736  WBC 9.8  RBC 4.79  HGB 14.6  HCT 44.4  MCV 92.7  MCH 30.5  MCHC 32.9  RDW 13.2  PLT 226   Cardiac Enzymes Recent Labs  Lab 11/14/18 1736  TROPONINI 4.68*   No results for input(s): TROPIPOC in the last 168 hours.  BNPNo results for input(s): BNP, PROBNP in the last 168 hours.  DDimer No results for input(s): DDIMER in the last 168 hours.  Radiology/Studies:  Dg Chest 2 View  Result Date: 11/14/2018 CLINICAL DATA:  Left-sided chest pain and shortness-of-breath beginning yesterday. Dry cough. EXAM: CHEST - 2 VIEW COMPARISON:  04/15/2014 FINDINGS: Lungs  are adequately inflated with mild prominence of the central bronchovascular markings. No lobar consolidation or effusion. Cardiomediastinal silhouette and remainder of the exam is unchanged. IMPRESSION: Mild prominence of the central bronchovascular markings which may be due  to acute bronchitic process. Electronically Signed   By: Elberta Fortis M.D.   On: 11/14/2018 18:10    Assessment and Plan:   1. NSTEMI: Currently asymptomatic but continues to have anginal symptoms with minimal activity.  The current elevation in troponin is likely related to the protracted episode of angina that occurred yesterday.  Unable to distinguish the distribution of ischemia or the extent of myocardium in jeopardy since his ECG is normal.  Consider left circumflex artery distribution since the ECG is so silent.  He has postinfarction angina and should undergo coronary geography and revascularization as appropriate.This procedure has been fully reviewed with the patient and written informed consent has been obtained.  We will start intravenous heparin and continue aspirin.  Start beta-blockers.   2. HLP: We will check a lipid profile, but likely will increase his atorvastatin to high-dose for the next 12 months. 3. Elevated BP: He does not usually have hypertension.  The current elevation of blood pressure may be situational.  Will start beta-blockers.  Severity of Illness: The appropriate patient status for this patient is INPATIENT. Inpatient status is judged to be reasonable and necessary in order to provide the required intensity of service to ensure the patient's safety. The patient's presenting symptoms, physical exam findings, and initial radiographic and laboratory data in the context of their chronic comorbidities is felt to place them at high risk for further clinical deterioration. Furthermore, it is not anticipated that the patient will be medically stable for discharge from the hospital within 2 midnights of admission. The following factors support the patient status of inpatient.   " The patient's presenting symptoms include prolonged angina at rest. " The worrisome physical exam findings include obesity. " The initial radiographic and laboratory data are worrisome because of  evidence of recent myocardial infarction. " The chronic co-morbidities include obesity, hyperlipidemia.   * I certify that at the point of admission it is my clinical judgment that the patient will require inpatient hospital care spanning beyond 2 midnights from the point of admission due to high intensity of service, high risk for further deterioration and high frequency of surveillance required.*    For questions or updates, please contact [facility] Please consult [email or web address] for contact info under        Signed, Thurmon Fair, MD  11/14/2018 7:55 PM

## 2018-11-14 NOTE — Progress Notes (Signed)
ANTICOAGULATION CONSULT NOTE - Initial Consult  Pharmacy Consult for heparin Indication: chest pain/ACS  Allergies  Allergen Reactions  . Eggs Or Egg-Derived Products     intolerance  . Lactose Intolerance (Gi)     May have skim milk    Patient Measurements: Height: 5\' 8"  (172.7 cm) Weight: 220 lb (99.8 kg) IBW/kg (Calculated) : 68.4 Heparin Dosing Weight: 89 kg   Vital Signs: Temp: 97.8 F (36.6 C) (03/30 1717) Temp Source: Oral (03/30 1717) BP: 126/89 (03/30 1730) Pulse Rate: 75 (03/30 1730)  Labs: Recent Labs    11/14/18 1736  HGB 14.6  HCT 44.4  PLT 226  CREATININE 1.13  TROPONINI 4.68*    Estimated Creatinine Clearance: 72.7 mL/min (by C-G formula based on SCr of 1.13 mg/dL).   Medical History: Past Medical History:  Diagnosis Date  . Arthritis   . Bronchitis with asthma, acute April 2013  . Depression    denies  . Frequency of urination    URINATION AT NIGHT, PAINFUL SLOW STREAM   . GERD (gastroesophageal reflux disease)    occ  . Hyperlipidemia   . Neuromuscular disorder (HCC)    DIZZINESS   PERIPHERAL NEUROPATHY   . Neuropathy   . No pertinent past medical history    RINGING RIGHT EAR  . Recurrent upper respiratory infection (URI)    SOB CURRENT COLD  . Shortness of breath    d/t "cold" 12/02/11    Medications:  (Not in a hospital admission)   Assessment: 88 YOM with chest pain to start IV heparin for ACS. Initial troponin is 4.68. H/H and Plt wnl.   Goal of Therapy:  Heparin level 0.3-0.7 units/ml Monitor platelets by anticoagulation protocol: Yes   Plan:  -Heparin 4000 units IV bolus, then start IV heparin at 1100 units/hr -F/u 6 hr HL -Monitor daily HL, CBC and s/s of bleeding   Vinnie Level, PharmD., BCPS Clinical Pharmacist Clinical phone for 11/14/18 until 10:30pm: [phone] If after 10:30pm, please refer to [facility] for unit-specific pharmacist

## 2018-11-14 NOTE — ED Triage Notes (Addendum)
Pt c/o Chest pain radiating to the left shoulder/jaw that began yesterday; pt states he is only SOB when the pain gets severe ; pt denies any fevers ; pt reports taking 6 baby ASA

## 2018-11-14 NOTE — ED Provider Notes (Signed)
[facility] EMERGENCY DEPARTMENT Provider Note   CSN: [id number] Arrival date & time: 11/14/18  1707    History   Chief Complaint Chief Complaint  Patient presents with   Chest Pain    HPI BELINDA BRINGHURST is a 68 y.o. male.     HPI   CP This afternoon started around 330PM Had some yesterday, began after walked to deck then had episode of pain yesterday left side that radiated to neck and jaw, lasted 24min-1hr yesterday Today was moving some pots and plants, working on flag and then started hurting. Finished up what doing and had same pain radiating from left chest to jaw.  Then it started again while sitting on couch.  Had some shortness of breath.  This afternoon was 4-5/10. Yesterday was 8-9/10.   Felt like severe aching, tightness going up into neck.  Tight in chest.  Tried drinking water and still was there.  Did have headache, took some ibuprofen.   No smoking, (33 yrs ago),no other drugs, occ etoh Mom had quadruple CABG, first diagnosed 64s or 61s Uncles had bypass as well  Never had symptoms like this. No recent stress test.   From [place], [place], originally  Past Medical History:  Diagnosis Date   Arthritis    Bronchitis with asthma, acute April 2013   Depression    denies   Frequency of urination    URINATION AT NIGHT, PAINFUL SLOW STREAM    GERD (gastroesophageal reflux disease)    occ   Hyperlipidemia    Neuromuscular disorder (HCC)    DIZZINESS   PERIPHERAL NEUROPATHY    Neuropathy    No pertinent past medical history    RINGING RIGHT EAR   Recurrent upper respiratory infection (URI)    SOB CURRENT COLD   Shortness of breath    d/t "cold" 12/02/11    Patient Active Problem List   Diagnosis Date Noted   NSTEMI (non-ST elevated myocardial infarction) (HCC) 11/14/2018   Back pain 09/02/2018   Lumbar stenosis with neurogenic claudication 11/08/2014   Herniated lumbar intervertebral disc 05/01/2014   Hereditary and  idiopathic peripheral neuropathy 05/02/2013   Thoracic root lesions, not elsewhere classified 05/02/2013    Past Surgical History:  Procedure Laterality Date   FINGER SURGERY     RIGHT MIDDLE FINGER   HERNIA REPAIR     UMBILICAL HERNIA 2011   LUMBAR LAMINECTOMY/DECOMPRESSION MICRODISCECTOMY  12/22/2011   Procedure: LUMBAR LAMINECTOMY/DECOMPRESSION MICRODISCECTOMY 1 LEVEL;  Surgeon: Maeola Harman, MD;  Location: [facility];  Service: Neurosurgery;  Laterality: N/A;  Thoracic ten-eleven laminectomy   LUMBAR LAMINECTOMY/DECOMPRESSION MICRODISCECTOMY Right 05/01/2014   Procedure: Right Lumbar three-four Microdiskectomy;  Surgeon: Maeola Harman, MD;  Location: [facility];  Service: Neurosurgery;  Laterality: Right;   LUMBAR LAMINECTOMY/DECOMPRESSION MICRODISCECTOMY Right 11/08/2014   Procedure: Right L4-5 L5-S1 Laminectomy;  Surgeon: Maeola Harman, MD;  Location: [facility];  Service: Neurosurgery;  Laterality: Right;  Right L4-5 L5-S1 Laminectomy   MULTIPLE TOOTH EXTRACTIONS          Home Medications    Prior to Admission medications   Medication Sig Start Date End Date Taking? Authorizing Provider  albuterol (PROVENTIL HFA;VENTOLIN HFA) 108 (90 Base) MCG/ACT inhaler Inhale 2 puffs into the lungs every 6 (six) hours as needed for wheezing or shortness of breath (seasonal allergies).   Yes [provider]  aspirin EC 81 MG tablet Take 81-162 mg by mouth See admin instructions. Take 2 tablets (162 mg) by  mouth daily at noon, take 1 tablet (81 mg) at midnight   Yes [provider]  atorvastatin (LIPITOR) 20 MG tablet Take 20 mg by mouth at bedtime. 11/09/18  Yes [provider]  cholecalciferol (VITAMIN D3) 25 MCG (1000 UT) tablet Take 1,000 Units by mouth daily at 6 PM.   Yes [provider]  Cyanocobalamin 2500 MCG TABS Take 2,500 mcg by mouth daily at 6 PM. Vitamin B12   Yes [provider]  gabapentin (NEURONTIN) 100 MG capsule TAKE 1  CAPSULE BY MOUTH 4 TIMES DAILY (TAKE WITH 800 MG TABLETS) Patient taking differently: Take 100 mg by mouth every 6 (six) hours. Take with one 800 mg tablet for a total dose of 900 mg. - 6am, noon, 6pm, midnight 09/02/18  Yes Nilda Riggs, NP  gabapentin (NEURONTIN) 800 MG tablet Take 1 tablet (800 mg total) by mouth 4 (four) times daily. Take with 100mg  caps Patient taking differently: Take 800 mg by mouth 4 (four) times daily. Take with one 100 mg capsule for a total dose of 900 mg - 6am, noon, 6pm and midnight 09/02/18  Yes Nilda Riggs, NP  imipramine (TOFRANIL) 25 MG tablet TAKE 6 TABLETS BY MOUTH ONCE DAILY AT BEDTIME Patient taking differently: Take 150 mg by mouth daily at 6 PM.  09/02/18  Yes Nilda Riggs, NP  Omega-3 Fatty Acids (FISH OIL) 1000 MG CAPS Take 1,000 mg by mouth daily at 6 PM.   Yes [provider]  pregabalin (LYRICA) 100 MG capsule Take 1 capsule (100 mg total) by mouth 2 (two) times daily. Patient taking differently: Take 100 mg by mouth every 12 (twelve) hours. Noon and midnight 09/02/18  Yes Nilda Riggs, NP  senna-docusate (SENNA S) 8.6-50 MG per tablet Take 2 tablets by mouth at bedtime as needed (constipation).    Yes [provider]  sildenafil (VIAGRA) 50 MG tablet Take 50 mg by mouth daily as needed for erectile dysfunction.   Yes [provider]  VITAMIN E PO Take 1 capsule by mouth daily at 6 PM.   Yes [provider]  zonisamide (ZONEGRAN) 100 MG capsule TAKE 3 CAPSULES BY MOUTH ONCE DAILY AT BEDTIME Patient taking differently: Take 300 mg by mouth daily at 6 PM.  09/02/18  Yes Nilda Riggs, NP  diazepam (VALIUM) 5 MG tablet Take 1 tablet (5 mg total) by mouth every 6 (six) hours as needed for muscle spasms. Patient not taking: Reported on 11/14/2018 11/09/14   Barnett Abu, MD    Family History Family History  Problem Relation Age of Onset   Cancer Mother    Stroke Father     Neuropathy Brother     Social History Social History   Tobacco Use   Smoking status: Former Smoker    Packs/day: 2.00    Years: 15.00    Pack years: 30.00    Types: Cigarettes    Last attempt to quit: 04/24/1984    Years since quitting: 34.5   Smokeless tobacco: Never Used  Substance Use Topics   Alcohol use: Yes    Comment: occ   Drug use: No     Allergies   Eggs or egg-derived products and Lactose intolerance (gi)   Review of Systems Review of Systems  Constitutional: Negative for diaphoresis, fatigue and fever.  HENT: Positive for rhinorrhea (allergies pollen). Negative for congestion and sore throat.   Eyes: Negative for visual disturbance.  Respiratory: Positive for shortness of breath.  Negative for cough.   Cardiovascular: Positive for chest pain.  Gastrointestinal: Negative for abdominal pain, diarrhea, nausea and vomiting.  Genitourinary: Negative for difficulty urinating.  Musculoskeletal: Negative for back pain and neck stiffness.  Skin: Negative for rash.  Neurological: Positive for headaches. Negative for syncope.     Physical Exam Updated Vital Signs BP 125/88 (BP Location: Right Arm)    Pulse 70    Temp 97.7 F (36.5 C) (Oral)    Resp 19    Ht 5\' 9"  (1.753 m)    Wt 99.4 kg    SpO2 93%    BMI 32.36 kg/m   Physical Exam Vitals signs and nursing note reviewed.  Constitutional:      General: He is not in acute distress.    Appearance: He is well-developed. He is not diaphoretic.  HENT:     Head: Normocephalic and atraumatic.  Eyes:     Conjunctiva/sclera: Conjunctivae normal.  Neck:     Musculoskeletal: Normal range of motion.  Cardiovascular:     Rate and Rhythm: Normal rate and regular rhythm.     Heart sounds: Normal heart sounds. No murmur. No friction rub. No gallop.   Pulmonary:     Effort: Pulmonary effort is normal. No respiratory distress.     Breath sounds: Normal breath sounds. No wheezing or rales.  Abdominal:     General: There  is no distension.     Palpations: Abdomen is soft.     Tenderness: There is no abdominal tenderness. There is no guarding.  Skin:    General: Skin is warm and dry.  Neurological:     Mental Status: He is alert and oriented to person, place, and time.      ED Treatments / Results  Labs (all labs ordered are listed, but only abnormal results are displayed) Labs Reviewed  CBC WITH DIFFERENTIAL/PLATELET - Abnormal; Notable for the following components:      Result Value   Monocytes Absolute 1.2 (*)    All other components within normal limits  COMPREHENSIVE METABOLIC PANEL - Abnormal; Notable for the following components:   Glucose, Bld 127 (*)    AST 57 (*)    ALT 48 (*)    Alkaline Phosphatase 156 (*)    Total Bilirubin 1.4 (*)    All other components within normal limits  TROPONIN I - Abnormal; Notable for the following components:   Troponin I 4.68 (*)    All other components within normal limits  TROPONIN I - Abnormal; Notable for the following components:   Troponin I 5.15 (*)    All other components within normal limits  MRSA PCR SCREENING  HEPARIN LEVEL (UNFRACTIONATED)  CBC  HIV ANTIBODY (ROUTINE TESTING W REFLEX)  TROPONIN I  TROPONIN I  BASIC METABOLIC PANEL  LIPID PANEL    EKG EKG Interpretation  Date/Time:  Monday November 14 2018 17:16:44 EDT Ventricular Rate:  77 PR Interval:    QRS Duration: 114 QT Interval:  394 QTC Calculation: 446 R Axis:   89 Text Interpretation:  Sinus rhythm Incomplete right bundle branch block No significant change since last tracing Confirmed by Alvira Monday ([id number]) on 11/14/2018 5:18:58 PM   Radiology Dg Chest 2 View  Result Date: 11/14/2018 CLINICAL DATA:  Left-sided chest pain and shortness-of-breath beginning yesterday. Dry cough. EXAM: CHEST - 2 VIEW COMPARISON:  04/15/2014 FINDINGS: Lungs are adequately inflated with mild prominence of the central bronchovascular markings. No lobar consolidation or effusion.  Cardiomediastinal silhouette and remainder  of the exam is unchanged. IMPRESSION: Mild prominence of the central bronchovascular markings which may be due to acute bronchitic process. Electronically Signed   By: Elberta Fortis M.D.   On: 11/14/2018 18:10    Procedures .Critical Care Performed by: Alvira Monday, MD Authorized by: Alvira Monday, MD   Critical care provider statement:    Critical care time (minutes):  45   Critical care was necessary to treat or prevent imminent or life-threatening deterioration of the following conditions:  Cardiac failure   Critical care was time spent personally by me on the following activities:  Discussions with consultants, evaluation of patient's response to treatment, examination of patient, ordering and performing treatments and interventions, ordering and review of laboratory studies, ordering and review of radiographic studies, pulse oximetry, re-evaluation of patient's condition, obtaining history from patient or surrogate and review of old charts   (including critical care time)  Medications Ordered in ED Medications  heparin ADULT infusion 100 units/mL (25000 units/236mL sodium chloride 0.45%) (1,100 Units/hr Intravenous IV Pump Association 11/14/18 2010)  diazepam (VALIUM) tablet 5 mg (has no administration in time range)  finasteride (PROSCAR) tablet 5 mg (has no administration in time range)  gabapentin (NEURONTIN) capsule 800 mg (800 mg Oral Given 11/14/18 2110)  pregabalin (LYRICA) capsule 100 mg (100 mg Oral Given 11/14/18 2110)  HYDROmorphone (DILAUDID) tablet 2-4 mg (has no administration in time range)  aspirin EC tablet 162 mg (has no administration in time range)  nitroGLYCERIN (NITROSTAT) SL tablet 0.4 mg (has no administration in time range)  acetaminophen (TYLENOL) tablet 650 mg (has no administration in time range)  ondansetron (ZOFRAN) injection 4 mg (has no administration in time range)  atorvastatin (LIPITOR) tablet 80 mg (has  no administration in time range)  sodium chloride flush (NS) 0.9 % injection 3 mL (3 mLs Intravenous Not Given 11/14/18 2110)  sodium chloride flush (NS) 0.9 % injection 3 mL (has no administration in time range)  0.9 %  sodium chloride infusion ( Intravenous Rate/Dose Change 11/14/18 2108)  aspirin chewable tablet 81 mg (has no administration in time range)  0.9% sodium chloride infusion (has no administration in time range)    Followed by  0.9% sodium chloride infusion (has no administration in time range)  heparin bolus via infusion 4,000 Units (4,000 Units Intravenous Bolus from Bag 11/14/18 2010)  aspirin chewable tablet 324 mg (324 mg Oral Given 11/14/18 2110)    Or  aspirin suppository 300 mg ( Rectal See Alternative 11/14/18 2110)     Initial Impression / Assessment and Plan / ED Course  I have reviewed the triage vital signs and the nursing notes.  Pertinent labs & imaging results that were available during my care of the patient were reviewed by me and considered in my medical decision making (see chart for details).        68 year old male with a history of hyperlipidemia, peripheral neuropathy, presents with concern for chest pain.  EKG was done and evaluate by me and showed no acute ST changes and no signs of pericarditis. Chest x-ray was done and evaluated by me and radiology and showed no sign of pneumonia or pneumothorax. Low suspicion for PE by history.  Concern for angina on history.  Troponin greater than 4, likely secondary to NSTEMI. Given heparin bolus and gtt, Cardiology to admit.    Final Clinical Impressions(s) / ED Diagnoses   Final diagnoses:  NSTEMI (non-ST elevated myocardial infarction) [facility])    ED Discharge Orders  None       Alvira Monday, MD 11/14/18 [phone]

## 2018-11-15 ENCOUNTER — Other Ambulatory Visit: Payer: Self-pay

## 2018-11-15 ENCOUNTER — Encounter (HOSPITAL_COMMUNITY)
Admission: EM | Disposition: A | Discharge status: Home / Self Care | Payer: Self-pay | Source: Home / Self Care | Attending: Cardiology

## 2018-11-15 ENCOUNTER — Encounter (HOSPITAL_COMMUNITY): Payer: Self-pay | Admitting: Cardiology

## 2018-11-15 DIAGNOSIS — I2510 Atherosclerotic heart disease of native coronary artery without angina pectoris: Secondary | ICD-10-CM

## 2018-11-15 DIAGNOSIS — Z7982 Long term (current) use of aspirin: Secondary | ICD-10-CM | POA: Diagnosis not present

## 2018-11-15 DIAGNOSIS — I214 Non-ST elevation (NSTEMI) myocardial infarction: Secondary | ICD-10-CM | POA: Diagnosis present

## 2018-11-15 DIAGNOSIS — R739 Hyperglycemia, unspecified: Secondary | ICD-10-CM | POA: Diagnosis present

## 2018-11-15 DIAGNOSIS — E785 Hyperlipidemia, unspecified: Secondary | ICD-10-CM | POA: Diagnosis present

## 2018-11-15 DIAGNOSIS — Z9861 Coronary angioplasty status: Secondary | ICD-10-CM

## 2018-11-15 DIAGNOSIS — J45909 Unspecified asthma, uncomplicated: Secondary | ICD-10-CM | POA: Diagnosis present

## 2018-11-15 DIAGNOSIS — R338 Other retention of urine: Secondary | ICD-10-CM | POA: Diagnosis present

## 2018-11-15 DIAGNOSIS — M48062 Spinal stenosis, lumbar region with neurogenic claudication: Secondary | ICD-10-CM | POA: Diagnosis present

## 2018-11-15 DIAGNOSIS — Z791 Long term (current) use of non-steroidal anti-inflammatories (NSAID): Secondary | ICD-10-CM | POA: Diagnosis not present

## 2018-11-15 DIAGNOSIS — I4519 Other right bundle-branch block: Secondary | ICD-10-CM | POA: Diagnosis present

## 2018-11-15 DIAGNOSIS — Z823 Family history of stroke: Secondary | ICD-10-CM | POA: Diagnosis not present

## 2018-11-15 DIAGNOSIS — R030 Elevated blood-pressure reading, without diagnosis of hypertension: Secondary | ICD-10-CM | POA: Diagnosis present

## 2018-11-15 DIAGNOSIS — Z820 Family history of epilepsy and other diseases of the nervous system: Secondary | ICD-10-CM | POA: Diagnosis not present

## 2018-11-15 DIAGNOSIS — R0789 Other chest pain: Secondary | ICD-10-CM | POA: Diagnosis present

## 2018-11-15 DIAGNOSIS — Z87891 Personal history of nicotine dependence: Secondary | ICD-10-CM | POA: Diagnosis not present

## 2018-11-15 DIAGNOSIS — Z79899 Other long term (current) drug therapy: Secondary | ICD-10-CM | POA: Diagnosis not present

## 2018-11-15 DIAGNOSIS — I237 Postinfarction angina: Secondary | ICD-10-CM | POA: Diagnosis present

## 2018-11-15 DIAGNOSIS — Z8249 Family history of ischemic heart disease and other diseases of the circulatory system: Secondary | ICD-10-CM | POA: Diagnosis not present

## 2018-11-15 DIAGNOSIS — I25110 Atherosclerotic heart disease of native coronary artery with unstable angina pectoris: Secondary | ICD-10-CM | POA: Diagnosis present

## 2018-11-15 DIAGNOSIS — I10 Essential (primary) hypertension: Secondary | ICD-10-CM | POA: Diagnosis not present

## 2018-11-15 HISTORY — DX: Coronary angioplasty status: Z98.61

## 2018-11-15 HISTORY — PX: LEFT HEART CATH AND CORONARY ANGIOGRAPHY: CATH118249

## 2018-11-15 HISTORY — PX: CORONARY STENT INTERVENTION: CATH118234

## 2018-11-15 HISTORY — DX: Atherosclerotic heart disease of native coronary artery without angina pectoris: I25.10

## 2018-11-15 LAB — CBC
HCT: 42.2 % (ref 39.0–52.0)
HCT: 44.9 % (ref 39.0–52.0)
Hemoglobin: 13.8 g/dL (ref 13.0–17.0)
Hemoglobin: 14.7 g/dL (ref 13.0–17.0)
MCH: 30 pg (ref 26.0–34.0)
MCH: 29.8 pg (ref 26.0–34.0)
MCHC: 32.7 g/dL (ref 30.0–36.0)
MCHC: 32.7 g/dL (ref 30.0–36.0)
MCV: 91.7 fL (ref 80.0–100.0)
MCV: 90.9 fL (ref 80.0–100.0)
Platelets: 206 10*3/uL (ref 150–400)
Platelets: 187 10*3/uL (ref 150–400)
RBC: 4.6 MIL/uL (ref 4.22–5.81)
RBC: 4.94 MIL/uL (ref 4.22–5.81)
RDW: 13.3 % (ref 11.5–15.5)
RDW: 13.3 % (ref 11.5–15.5)
WBC: 6.6 10*3/uL (ref 4.0–10.5)
WBC: 6.8 10*3/uL (ref 4.0–10.5)
nRBC: 0 % (ref 0.0–0.2)
nRBC: 0 % (ref 0.0–0.2)

## 2018-11-15 LAB — LIPID PANEL
Cholesterol: 153 mg/dL (ref 0–200)
HDL: 32 mg/dL — ABNORMAL LOW (ref >40)
LDL Cholesterol: 63 mg/dL (ref 0–99)
Total CHOL/HDL Ratio: 4.8 RATIO
Triglycerides: 292 mg/dL — ABNORMAL HIGH (ref <150)
VLDL: 58 mg/dL — ABNORMAL HIGH (ref 0–40)

## 2018-11-15 LAB — BASIC METABOLIC PANEL
Anion gap: 7 (ref 5–15)
BUN: 16 mg/dL (ref 8–23)
CO2: 18 mmol/L — ABNORMAL LOW (ref 22–32)
Calcium: 8.3 mg/dL — ABNORMAL LOW (ref 8.9–10.3)
Chloride: 111 mmol/L (ref 98–111)
Creatinine, Ser: 0.97 mg/dL (ref 0.61–1.24)
GFR calc Af Amer: >60 mL/min (ref >60)
GFR calc non Af Amer: >60 mL/min (ref >60)
Glucose, Bld: 153 mg/dL — ABNORMAL HIGH (ref 70–99)
Potassium: 4.3 mmol/L (ref 3.5–5.1)
Sodium: 136 mmol/L (ref 135–145)

## 2018-11-15 LAB — TROPONIN I
Troponin I: 3.31 ng/mL (ref <0.03)
Troponin I: 1.93 ng/mL (ref <0.03)

## 2018-11-15 LAB — HEPARIN LEVEL (UNFRACTIONATED): Heparin Unfractionated: 0.29 IU/mL — ABNORMAL LOW (ref 0.30–0.70)

## 2018-11-15 LAB — POCT ACTIVATED CLOTTING TIME
Activated Clotting Time: 263 seconds
Activated Clotting Time: 835 seconds
Activated Clotting Time: 417 seconds

## 2018-11-15 LAB — HIV ANTIBODY (ROUTINE TESTING W REFLEX): HIV Screen 4th Generation wRfx: NONREACTIVE

## 2018-11-15 SURGERY — LEFT HEART CATH AND CORONARY ANGIOGRAPHY
Anesthesia: LOCAL

## 2018-11-15 MED ORDER — VERAPAMIL HCL 2.5 MG/ML IV SOLN
INTRAVENOUS | Status: DC | PRN
  Administered 2018-11-15: 10:00:00 via INTRA_ARTERIAL

## 2018-11-15 MED ORDER — MIDAZOLAM HCL 2 MG/2ML IJ SOLN
INTRAMUSCULAR | Status: DC | PRN
  Administered 2018-11-15: 1 mg via INTRAVENOUS

## 2018-11-15 MED ORDER — SODIUM CHLORIDE 0.9 % IV SOLN: 250.0000 mL | INTRAVENOUS | Status: DC | PRN

## 2018-11-15 MED ORDER — SODIUM CHLORIDE 0.9 % IV SOLN
INTRAVENOUS | Status: AC
  Administered 2018-11-15: 12:00:00 via INTRAVENOUS

## 2018-11-15 MED ORDER — FENTANYL CITRATE (PF) 100 MCG/2ML IJ SOLN
INTRAMUSCULAR | Status: DC | PRN
  Administered 2018-11-15: 25 ug via INTRAVENOUS

## 2018-11-15 MED ORDER — HEPARIN (PORCINE) IN NACL 1000-0.9 UT/500ML-% IV SOLN
INTRAVENOUS | Status: DC | PRN
  Administered 2018-11-15: 500 mL

## 2018-11-15 MED ORDER — HEPARIN (PORCINE) IN NACL 1000-0.9 UT/500ML-% IV SOLN
INTRAVENOUS | Status: AC
  Filled 2018-11-15: qty 500

## 2018-11-15 MED ORDER — HEPARIN SODIUM (PORCINE) 5000 UNIT/ML IJ SOLN
5000.0000 [IU] | Freq: Three times a day (TID) | INTRAMUSCULAR | Status: DC
  Administered 2018-11-15 – 2018-11-16 (×2): 5000 [IU] via SUBCUTANEOUS
  Filled 2018-11-15 (×2): qty 1

## 2018-11-15 MED ORDER — NITROGLYCERIN 1 MG/10 ML FOR IR/CATH LAB
INTRA_ARTERIAL | Status: AC
  Filled 2018-11-15: qty 10

## 2018-11-15 MED ORDER — TICAGRELOR 90 MG PO TABS
ORAL_TABLET | ORAL | Status: DC | PRN
  Administered 2018-11-15: 180 mg via ORAL

## 2018-11-15 MED ORDER — ASPIRIN EC 81 MG PO TBEC
81.0000 mg | DELAYED_RELEASE_TABLET | Freq: Every day | ORAL | Status: DC
  Administered 2018-11-16: 81 mg via ORAL
  Filled 2018-11-15: qty 1

## 2018-11-15 MED ORDER — NITROGLYCERIN 1 MG/10 ML FOR IR/CATH LAB
INTRA_ARTERIAL | Status: DC | PRN
  Administered 2018-11-15 (×2): 200 ug via INTRACORONARY

## 2018-11-15 MED ORDER — SODIUM CHLORIDE 0.9% FLUSH: 3.0000 mL | INTRAVENOUS | Status: DC | PRN

## 2018-11-15 MED ORDER — TICAGRELOR 90 MG PO TABS
90.0000 mg | ORAL_TABLET | Freq: Two times a day (BID) | ORAL | Status: DC
  Administered 2018-11-15 – 2018-11-16 (×2): 90 mg via ORAL
  Filled 2018-11-15 (×2): qty 1

## 2018-11-15 MED ORDER — IOHEXOL 350 MG/ML SOLN
INTRAVENOUS | Status: DC | PRN
  Administered 2018-11-15: 295 mL via INTRA_ARTERIAL

## 2018-11-15 MED ORDER — SODIUM CHLORIDE 0.9% FLUSH
3.0000 mL | Freq: Two times a day (BID) | INTRAVENOUS | Status: DC
  Administered 2018-11-15: 3 mL via INTRAVENOUS

## 2018-11-15 MED ORDER — FENTANYL CITRATE (PF) 100 MCG/2ML IJ SOLN
INTRAMUSCULAR | Status: AC
  Filled 2018-11-15: qty 2

## 2018-11-15 MED ORDER — HEPARIN SODIUM (PORCINE) 1000 UNIT/ML IJ SOLN
INTRAMUSCULAR | Status: AC
  Filled 2018-11-15: qty 1

## 2018-11-15 MED ORDER — MIDAZOLAM HCL 2 MG/2ML IJ SOLN
INTRAMUSCULAR | Status: AC
  Filled 2018-11-15: qty 2

## 2018-11-15 MED ORDER — TICAGRELOR 90 MG PO TABS
ORAL_TABLET | ORAL | Status: AC
  Filled 2018-11-15: qty 2

## 2018-11-15 MED ORDER — LIDOCAINE HCL (PF) 1 % IJ SOLN
INTRAMUSCULAR | Status: AC
  Filled 2018-11-15: qty 30

## 2018-11-15 MED ORDER — HEPARIN SODIUM (PORCINE) 1000 UNIT/ML IJ SOLN
INTRAMUSCULAR | Status: DC | PRN
  Administered 2018-11-15: 2000 [IU] via INTRAVENOUS
  Administered 2018-11-15 (×2): 5000 [IU] via INTRAVENOUS

## 2018-11-15 MED ORDER — SODIUM CHLORIDE 0.9 % WEIGHT BASED INFUSION: 3.0000 mL/kg/h | INTRAVENOUS | Status: DC

## 2018-11-15 MED ORDER — SODIUM CHLORIDE 0.9 % WEIGHT BASED INFUSION: 1.0000 mL/kg/h | INTRAVENOUS | Status: DC

## 2018-11-15 MED ORDER — VERAPAMIL HCL 2.5 MG/ML IV SOLN
INTRAVENOUS | Status: AC
  Filled 2018-11-15: qty 2

## 2018-11-15 MED ORDER — LIDOCAINE HCL (PF) 1 % IJ SOLN
INTRAMUSCULAR | Status: DC | PRN
  Administered 2018-11-15: 2 mL

## 2018-11-15 SURGICAL SUPPLY — 25 items
BALLN SAPPHIRE 2.0X12 (BALLOONS) ×2
BALLN ~~LOC~~ EMERGE MR 2.5X15 (BALLOONS) ×2
BALLN ~~LOC~~ EMERGE MR 3.5X15 (BALLOONS) ×2
BALLOON SAPPHIRE 2.0X12 (BALLOONS) ×1 IMPLANT
BALLOON ~~LOC~~ EMERGE MR 2.5X15 (BALLOONS) ×1 IMPLANT
BALLOON ~~LOC~~ EMERGE MR 3.5X15 (BALLOONS) ×1 IMPLANT
CATH 5FR JL3.5 JR4 ANG PIG MP (CATHETERS) ×2 IMPLANT
CATH INFINITI 5 FR 3DRC (CATHETERS) ×2 IMPLANT
CATH INFINITI 5FR AL1 (CATHETERS) ×2 IMPLANT
CATH LAUNCHER 6FR EBU3.5 (CATHETERS) ×2 IMPLANT
DEVICE RAD COMP TR BAND LRG (VASCULAR PRODUCTS) ×2 IMPLANT
GLIDESHEATH SLEND SS 6F .021 (SHEATH) ×2 IMPLANT
GUIDEWIRE INQWIRE 1.5J.035X260 (WIRE) ×1 IMPLANT
INQWIRE 1.5J .035X260CM (WIRE) ×2
KIT ENCORE 26 ADVANTAGE (KITS) ×2 IMPLANT
KIT HEART LEFT (KITS) ×2 IMPLANT
PACK CARDIAC CATHETERIZATION (CUSTOM PROCEDURE TRAY) ×2 IMPLANT
STENT SYNERGY DES 2.25X32 (Permanent Stent) ×2 IMPLANT
STENT SYNERGY DES 3.5X28 (Permanent Stent) ×2 IMPLANT
TRANSDUCER W/STOPCOCK (MISCELLANEOUS) ×2 IMPLANT
TUBING CIL FLEX 10 FLL-RA (TUBING) ×2 IMPLANT
WIRE ASAHI FIELDER XT 190CM (WIRE) ×2 IMPLANT
WIRE ASAHI PROWATER 180CM (WIRE) ×2 IMPLANT
WIRE FIGHTER CROSSING 190CM (WIRE) ×2 IMPLANT
WIRE PT2 MS 185 (WIRE) ×2 IMPLANT

## 2018-11-15 NOTE — Plan of Care (Signed)
  Problem: Clinical Measurements: Goal: Ability to maintain clinical measurements within normal limits will improve Outcome: Progressing Goal: Diagnostic test results will improve Outcome: Progressing Goal: Cardiovascular complication will be avoided Outcome: Progressing   Problem: Nutrition: Goal: Adequate nutrition will be maintained Outcome: Progressing   Problem: Coping: Goal: Level of anxiety will decrease Outcome: Progressing   Problem: Elimination: Goal: Will not experience complications related to bowel motility Outcome: Progressing Goal: Will not experience complications related to urinary retention Outcome: Progressing   Problem: Safety: Goal: Ability to remain free from injury will improve Outcome: Progressing   Problem: Skin Integrity: Goal: Risk for impaired skin integrity will decrease Outcome: Progressing   Problem: Education: Goal: Understanding of CV disease, CV risk reduction, and recovery process will improve Outcome: Progressing   Problem: Activity: Goal: Ability to return to baseline activity level will improve Outcome: Progressing   Problem: Cardiovascular: Goal: Ability to achieve and maintain adequate cardiovascular perfusion will improve Outcome: Progressing

## 2018-11-15 NOTE — H&P (View-Only) (Signed)
Progress Note  Patient Name: Arthur Smith Date of Encounter: 11/15/2018  Primary Cardiologist: No primary care provider on file. -Seen by Dr. Rachelle Hora Croitoru  Subjective   Did have a brief episode of chest pain last evening that resolved quickly. No further episodes.  Feels okay right now.  Inpatient Medications    Scheduled Meds: . [START ON 11/16/2018] aspirin EC  81 mg Oral Daily  . atorvastatin  80 mg Oral q1800  . finasteride  5 mg Oral Daily  . gabapentin  800 mg Oral QID  . pregabalin  100 mg Oral BID  . sodium chloride flush  3 mL Intravenous Q12H   Continuous Infusions: . sodium chloride Stopped (11/15/18 0447)  . sodium chloride 1 mL/kg/hr (11/15/18 0447)  . heparin 1,250 Units/hr (11/15/18 0347)   PRN Meds: sodium chloride, acetaminophen, diazepam, HYDROmorphone, nitroGLYCERIN, ondansetron (ZOFRAN) IV, sodium chloride flush   Vital Signs    Vitals:   11/14/18 2053 11/14/18 2100 11/14/18 2312 11/15/18 0359  BP:  125/88 117/68 95/61  Pulse:  70 81 73  Resp:  19 20 14   Temp:  97.7 F (36.5 C) (!) 97.4 F (36.3 C) (!) 97.5 F (36.4 C)  TempSrc:  Oral Oral Oral  SpO2:  93% 95% 97%  Weight: 99.4 kg     Height: 5\' 9"  (1.753 m)       Intake/Output Summary (Last 24 hours) at 11/15/2018 0915 Last data filed at 11/15/2018 1700 Gross per 24 hour  Intake 926.74 ml  Output 575 ml  Net 351.74 ml   Last 3 Weights 11/14/2018 11/14/2018 09/02/2018  Weight (lbs) 219 lb 2.2 oz 220 lb 224 lb 9.6 oz  Weight (kg) 99.4 kg 99.791 kg 101.878 kg      Telemetry    Sinus rhythm rate in 60s to 70s.- Personally Reviewed  ECG    SR with J point elevation in lead II, III - Personally Reviewed  Physical Exam   GEN:  Resting comfortably in bed.  No acute distress.   Neck: No JVD.  No carotid bruit. Cardiac: RRR, normal S1 and S2 (somewhat distant).  No murmurs, rubs, or gallops.  Respiratory: Clear to auscultation bilaterally.  Nonlabored.  No W/R/R GI: Soft, nontender,  non-distended.  No HJR.  No HSM. MS: No edema; No deformity. Neuro:  Nonfocal.  CN II-XII grossly intact. Psych: Normal mood and affect   Labs    Chemistry Recent Labs  Lab 11/14/18 1736  NA 138  K 3.7  CL 107  CO2 24  GLUCOSE 127*  BUN 17  CREATININE 1.13  CALCIUM 9.3  PROT 7.0  ALBUMIN 4.0  AST 57*  ALT 48*  ALKPHOS 156*  BILITOT 1.4*  GFRNONAA >60  GFRAA >60  ANIONGAP 7     Hematology Recent Labs  Lab 11/14/18 1736 11/15/18 0240  WBC 9.8 6.6  RBC 4.79 4.60  HGB 14.6 13.8  HCT 44.4 42.2  MCV 92.7 91.7  MCH 30.5 30.0  MCHC 32.9 32.7  RDW 13.2 13.3  PLT 226 206    Cardiac Enzymes Recent Labs  Lab 11/14/18 1736 11/14/18 2115 11/15/18 0240  TROPONINI 4.68* 5.15* 3.31*   No results for input(s): TROPIPOC in the last 168 hours.   BNPNo results for input(s): BNP, PROBNP in the last 168 hours.   DDimer No results for input(s): DDIMER in the last 168 hours.   Radiology    Dg Chest 2 View  Result Date: 11/14/2018 CLINICAL DATA:  Left-sided  chest pain and shortness-of-breath beginning yesterday. Dry cough. EXAM: CHEST - 2 VIEW COMPARISON:  04/15/2014 FINDINGS: Lungs are adequately inflated with mild prominence of the central bronchovascular markings. No lobar consolidation or effusion. Cardiomediastinal silhouette and remainder of the exam is unchanged. IMPRESSION: Mild prominence of the central bronchovascular markings which may be due to acute bronchitic process. Electronically Signed   By: Elberta Fortis M.D.   On: 11/14/2018 18:10    Cardiac Studies   Cath pending.  Patient Profile     68 y.o. male with PMH of HL and family hx of early CAD who presented with several episodes of chest pain with exertion.   Assessment & Plan    1. NSTEMI: Troponin peaked at 5.15 now trending back down at 3.31.   Remains on IV heparin.   Planned for cardiac cath today.   Blood pressures have been soft (were high when he came in, but now low). Will hold on  adding BB at this time.  -- The patient understands that risks included but are not limited to stroke (1 in 1000), death (1 in 1000), kidney failure [usually temporary] (1 in 500), bleeding (1 in 200), allergic reaction [possibly serious] (1 in 200).   2. HL: LDL 63, Trig 292. Now on high dose statin   For questions or updates, please contact [facility] Please consult [email or web address] for contact info under        Signed, Bryan Lemma, MD  11/15/2018, 9:15 AM    ATTENDING ATTESTATION  I have seen, examined and evaluated the patient this AM along with Laverda Page, NP-C.  After reviewing all the available data and chart, we discussed the patients laboratory, study & physical findings as well as symptoms in detail. I agree with her findings, examination as well as impression recommendations as per our discussion.    Attending adjustments noted in italics.   Admitted with non-STEMI.  No further chest pain, on IV heparin.   Plan for cath today.  More details following cath results.   Bryan Lemma, M.D., M.S. Interventional Cardiologist   Pager # [phone] Phone # [phone] [address] [place], [place] [address]

## 2018-11-15 NOTE — Interval H&P Note (Signed)
History and Physical Interval Note:  11/15/2018 9:25 AM  Arthur Smith  has presented today for surgery, with the diagnosis of NSTEMI.  The various methods of treatment have been discussed with the patient and family. After consideration of risks, benefits and other options for treatment, the patient has consented to  Procedure(s): LEFT HEART CATH AND CORONARY ANGIOGRAPHY (N/A) as a surgical intervention.  The patient's history has been reviewed, patient examined, no change in status, stable for surgery.  I have reviewed the patient's chart and labs.  Questions were answered to the patient's satisfaction.    Cath Lab Visit (complete for each Cath Lab visit)  Clinical Evaluation Leading to the Procedure:   ACS: Yes.    Non-ACS:    Anginal Classification: CCS IV  Anti-ischemic medical therapy: No Therapy  Non-Invasive Test Results: No non-invasive testing performed  Prior CABG: No previous CABG       Theron Arista [facility] 11/15/2018 9:25 AM

## 2018-11-15 NOTE — Progress Notes (Signed)
ANTICOAGULATION CONSULT NOTE   Pharmacy Consult for Heparin Indication: chest pain/ACS  Allergies  Allergen Reactions  . Eggs Or Egg-Derived Products Other (See Comments)    Egg yolks cause stomach cramps and diarrhea  . Lactose Intolerance (Gi) Other (See Comments)    Causes stomach cramps and diarrhea    Patient Measurements: Height: 5\' 9"  (175.3 cm) Weight: 219 lb 2.2 oz (99.4 kg) IBW/kg (Calculated) : 70.7 Heparin Dosing Weight: 89 kg   Vital Signs: Temp: 97.4 F (36.3 C) (03/30 2312) Temp Source: Oral (03/30 2312) BP: 117/68 (03/30 2312) Pulse Rate: 81 (03/30 2312)  Labs: Recent Labs    11/14/18 1736 11/14/18 2115 11/15/18 0240  HGB 14.6  --  13.8  HCT 44.4  --  42.2  PLT 226  --  206  HEPARINUNFRC  --   --  0.29*  CREATININE 1.13  --   --   TROPONINI 4.68* 5.15*  --     Estimated Creatinine Clearance: 73.8 mL/min (by C-G formula based on SCr of 1.13 mg/dL).   Medical History: Past Medical History:  Diagnosis Date  . Arthritis   . Bronchitis with asthma, acute April 2013  . Depression    denies  . Frequency of urination    URINATION AT NIGHT, PAINFUL SLOW STREAM   . GERD (gastroesophageal reflux disease)    occ  . Hyperlipidemia   . Neuromuscular disorder (HCC)    DIZZINESS   PERIPHERAL NEUROPATHY   . Neuropathy   . No pertinent past medical history    RINGING RIGHT EAR  . Recurrent upper respiratory infection (URI)    SOB CURRENT COLD  . Shortness of breath    d/t "cold" 12/02/11    Medications:  Medications Prior to Admission  Medication Sig Dispense Refill Last Dose  . albuterol (PROVENTIL HFA;VENTOLIN HFA) 108 (90 Base) MCG/ACT inhaler Inhale 2 puffs into the lungs every 6 (six) hours as needed for wheezing or shortness of breath (seasonal allergies).   couple days ago  . aspirin EC 81 MG tablet Take 81-162 mg by mouth See admin instructions. Take 2 tablets (162 mg) by mouth daily at noon, take 1 tablet (81 mg) at midnight   11/14/2018 at  am  . atorvastatin (LIPITOR) 20 MG tablet Take 20 mg by mouth at bedtime.   11/13/2018 at midnight  . cholecalciferol (VITAMIN D3) 25 MCG (1000 UT) tablet Take 1,000 Units by mouth daily at 6 PM.   11/14/2018 at pm  . Cyanocobalamin 2500 MCG TABS Take 2,500 mcg by mouth daily at 6 PM. Vitamin B12   11/14/2018 at pm  . gabapentin (NEURONTIN) 100 MG capsule TAKE 1 CAPSULE BY MOUTH 4 TIMES DAILY (TAKE WITH 800 MG TABLETS) (Patient taking differently: Take 100 mg by mouth every 6 (six) hours. Take with one 800 mg tablet for a total dose of 900 mg. - 6am, noon, 6pm, midnight) 360 capsule 3 11/14/2018 at 1730  . gabapentin (NEURONTIN) 800 MG tablet Take 1 tablet (800 mg total) by mouth 4 (four) times daily. Take with 100mg  caps (Patient taking differently: Take 800 mg by mouth 4 (four) times daily. Take with one 100 mg capsule for a total dose of 900 mg - 6am, noon, 6pm and midnight) 360 tablet 3 11/14/2018 at 1730  . imipramine (TOFRANIL) 25 MG tablet TAKE 6 TABLETS BY MOUTH ONCE DAILY AT BEDTIME (Patient taking differently: Take 150 mg by mouth daily at 6 PM. ) 540 tablet 3 11/14/2018 at 1730  .  Omega-3 Fatty Acids (FISH OIL) 1000 MG CAPS Take 1,000 mg by mouth daily at 6 PM.   11/14/2018 at pm  . pregabalin (LYRICA) 100 MG capsule Take 1 capsule (100 mg total) by mouth 2 (two) times daily. (Patient taking differently: Take 100 mg by mouth every 12 (twelve) hours. Noon and midnight) 180 capsule 1 11/14/2018 at 1200  . senna-docusate (SENNA S) 8.6-50 MG per tablet Take 2 tablets by mouth at bedtime as needed (constipation).    couple weeks ago  . sildenafil (VIAGRA) 50 MG tablet Take 50 mg by mouth daily as needed for erectile dysfunction.   several months ago  . VITAMIN E PO Take 1 capsule by mouth daily at 6 PM.   11/14/2018 at pm  . zonisamide (ZONEGRAN) 100 MG capsule TAKE 3 CAPSULES BY MOUTH ONCE DAILY AT BEDTIME (Patient taking differently: Take 300 mg by mouth daily at 6 PM. ) 270 capsule 3 11/14/2018 at 1730   . diazepam (VALIUM) 5 MG tablet Take 1 tablet (5 mg total) by mouth every 6 (six) hours as needed for muscle spasms. (Patient not taking: Reported on 11/14/2018) 30 tablet 0 Not Taking    Assessment: 76 YOM with chest pain to start IV heparin for ACS. Initial troponin is 4.68. H/H and Plt wnl.   3/31 AM update: heparin level just below goal, no issues per RN, trop 5.15  Goal of Therapy:  Heparin level 0.3-0.7 units/ml Monitor platelets by anticoagulation protocol: Yes   Plan:  -Inc heparin to 1250 units/hr -1200 HL  Abran Duke, PharmD, BCPS Clinical Pharmacist Phone: [phone]

## 2018-11-15 NOTE — Progress Notes (Signed)
   11/15/18 1900  Clinical Encounter Type  Visited With Health care provider  Visit Type Initial  Referral From Nurse   Spoke w/ care RN Shanda Bumps via telephone.  Explained that d/t restrictions on visitors and volunteers who serve as witnesses, we are currently unable to notarize Advance Directives.  RN confirmed that she had already explained that pt's wife would be first person to make decisions, if circumstances were to arise, in the absence of a HCPOA.  Pls pg or re-consult if add'l information is needed or if pt has questions.  Margretta Sidle resident, [phone]

## 2018-11-15 NOTE — Progress Notes (Signed)
Progress Note  Patient Name: Arthur Smith Date of Encounter: 11/15/2018  Primary Cardiologist: No primary care provider on file. -Seen by Dr. Rachelle Hora Croitoru  Subjective   Did have a brief episode of chest pain last evening that resolved quickly. No further episodes.  Feels okay right now.  Inpatient Medications    Scheduled Meds: . [START ON 11/16/2018] aspirin EC  81 mg Oral Daily  . atorvastatin  80 mg Oral q1800  . finasteride  5 mg Oral Daily  . gabapentin  800 mg Oral QID  . pregabalin  100 mg Oral BID  . sodium chloride flush  3 mL Intravenous Q12H   Continuous Infusions: . sodium chloride Stopped (11/15/18 0447)  . sodium chloride 1 mL/kg/hr (11/15/18 0447)  . heparin 1,250 Units/hr (11/15/18 0347)   PRN Meds: sodium chloride, acetaminophen, diazepam, HYDROmorphone, nitroGLYCERIN, ondansetron (ZOFRAN) IV, sodium chloride flush   Vital Signs    Vitals:   11/14/18 2053 11/14/18 2100 11/14/18 2312 11/15/18 0359  BP:  125/88 117/68 95/61  Pulse:  70 81 73  Resp:  19 20 14   Temp:  97.7 F (36.5 C) (!) 97.4 F (36.3 C) (!) 97.5 F (36.4 C)  TempSrc:  Oral Oral Oral  SpO2:  93% 95% 97%  Weight: 99.4 kg     Height: 5\' 9"  (1.753 m)       Intake/Output Summary (Last 24 hours) at 11/15/2018 0915 Last data filed at 11/15/2018 1700 Gross per 24 hour  Intake 926.74 ml  Output 575 ml  Net 351.74 ml   Last 3 Weights 11/14/2018 11/14/2018 09/02/2018  Weight (lbs) 219 lb 2.2 oz 220 lb 224 lb 9.6 oz  Weight (kg) 99.4 kg 99.791 kg 101.878 kg      Telemetry    Sinus rhythm rate in 60s to 70s.- Personally Reviewed  ECG    SR with J point elevation in lead II, III - Personally Reviewed  Physical Exam   GEN:  Resting comfortably in bed.  No acute distress.   Neck: No JVD.  No carotid bruit. Cardiac: RRR, normal S1 and S2 (somewhat distant).  No murmurs, rubs, or gallops.  Respiratory: Clear to auscultation bilaterally.  Nonlabored.  No W/R/R GI: Soft, nontender,  non-distended.  No HJR.  No HSM. MS: No edema; No deformity. Neuro:  Nonfocal.  CN II-XII grossly intact. Psych: Normal mood and affect   Labs    Chemistry Recent Labs  Lab 11/14/18 1736  NA 138  K 3.7  CL 107  CO2 24  GLUCOSE 127*  BUN 17  CREATININE 1.13  CALCIUM 9.3  PROT 7.0  ALBUMIN 4.0  AST 57*  ALT 48*  ALKPHOS 156*  BILITOT 1.4*  GFRNONAA >60  GFRAA >60  ANIONGAP 7     Hematology Recent Labs  Lab 11/14/18 1736 11/15/18 0240  WBC 9.8 6.6  RBC 4.79 4.60  HGB 14.6 13.8  HCT 44.4 42.2  MCV 92.7 91.7  MCH 30.5 30.0  MCHC 32.9 32.7  RDW 13.2 13.3  PLT 226 206    Cardiac Enzymes Recent Labs  Lab 11/14/18 1736 11/14/18 2115 11/15/18 0240  TROPONINI 4.68* 5.15* 3.31*   No results for input(s): TROPIPOC in the last 168 hours.   BNPNo results for input(s): BNP, PROBNP in the last 168 hours.   DDimer No results for input(s): DDIMER in the last 168 hours.   Radiology    Dg Chest 2 View  Result Date: 11/14/2018 CLINICAL DATA:  Left-sided  chest pain and shortness-of-breath beginning yesterday. Dry cough. EXAM: CHEST - 2 VIEW COMPARISON:  04/15/2014 FINDINGS: Lungs are adequately inflated with mild prominence of the central bronchovascular markings. No lobar consolidation or effusion. Cardiomediastinal silhouette and remainder of the exam is unchanged. IMPRESSION: Mild prominence of the central bronchovascular markings which may be due to acute bronchitic process. Electronically Signed   By: Daniel  Boyle M.D.   On: 11/14/2018 18:10    Cardiac Studies   Cath pending.  Patient Profile     67 y.o. male with PMH of HL and family hx of early CAD who presented with several episodes of chest pain with exertion.   Assessment & Plan    1. NSTEMI: Troponin peaked at 5.15 now trending back down at 3.31.   Remains on IV heparin.   Planned for cardiac cath today.   Blood pressures have been soft (were high when he came in, but now low). Will hold on  adding BB at this time.  -- The patient understands that risks included but are not limited to stroke (1 in 1000), death (1 in 1000), kidney failure [usually temporary] (1 in 500), bleeding (1 in 200), allergic reaction [possibly serious] (1 in 200).   2. HL: LDL 63, Trig 292. Now on high dose statin   For questions or updates, please contact [facility] Please consult [email or web address] for contact info under        Signed, David Harding, MD  11/15/2018, 9:15 AM    ATTENDING ATTESTATION  I have seen, examined and evaluated the patient this AM along with Lindsay Roberts, NP-C.  After reviewing all the available data and chart, we discussed the patients laboratory, study & physical findings as well as symptoms in detail. I agree with her findings, examination as well as impression recommendations as per our discussion.    Attending adjustments noted in italics.   Admitted with non-STEMI.  No further chest pain, on IV heparin.   Plan for cath today.  More details following cath results.   David Harding, M.D., M.S. Interventional Cardiologist   Pager # [phone] Phone # [phone] [address] [place], [place] [address]    

## 2018-11-16 ENCOUNTER — Inpatient Hospital Stay (HOSPITAL_COMMUNITY)
Admission: EM | Disposition: A | Discharge status: Home / Self Care | Payer: Self-pay | Source: Home / Self Care | Attending: Cardiology

## 2018-11-16 ENCOUNTER — Encounter (HOSPITAL_COMMUNITY): Payer: Self-pay | Admitting: Cardiology

## 2018-11-16 ENCOUNTER — Encounter (HOSPITAL_COMMUNITY)
Admission: EM | Disposition: A | Discharge status: Home / Self Care | Payer: Self-pay | Source: Home / Self Care | Attending: Cardiology

## 2018-11-16 DIAGNOSIS — I10 Essential (primary) hypertension: Secondary | ICD-10-CM

## 2018-11-16 DIAGNOSIS — I2510 Atherosclerotic heart disease of native coronary artery without angina pectoris: Secondary | ICD-10-CM

## 2018-11-16 DIAGNOSIS — Z9861 Coronary angioplasty status: Secondary | ICD-10-CM

## 2018-11-16 HISTORY — PX: CORONARY STENT INTERVENTION: CATH118234

## 2018-11-16 LAB — BASIC METABOLIC PANEL
Anion gap: 9 (ref 5–15)
BUN: 14 mg/dL (ref 8–23)
CO2: 21 mmol/L — ABNORMAL LOW (ref 22–32)
Calcium: 9 mg/dL (ref 8.9–10.3)
Chloride: 108 mmol/L (ref 98–111)
Creatinine, Ser: 1.06 mg/dL (ref 0.61–1.24)
GFR calc Af Amer: >60 mL/min (ref >60)
GFR calc non Af Amer: >60 mL/min (ref >60)
Glucose, Bld: 117 mg/dL — ABNORMAL HIGH (ref 70–99)
Potassium: 4 mmol/L (ref 3.5–5.1)
Sodium: 138 mmol/L (ref 135–145)

## 2018-11-16 LAB — CBC
HCT: 42.1 % (ref 39.0–52.0)
Hemoglobin: 13.7 g/dL (ref 13.0–17.0)
MCH: 29.6 pg (ref 26.0–34.0)
MCHC: 32.5 g/dL (ref 30.0–36.0)
MCV: 90.9 fL (ref 80.0–100.0)
Platelets: 203 10*3/uL (ref 150–400)
RBC: 4.63 MIL/uL (ref 4.22–5.81)
RDW: 13.4 % (ref 11.5–15.5)
WBC: 8.7 10*3/uL (ref 4.0–10.5)
nRBC: 0 % (ref 0.0–0.2)

## 2018-11-16 LAB — POCT ACTIVATED CLOTTING TIME
Activated Clotting Time: 279 seconds
Activated Clotting Time: 301 seconds
Activated Clotting Time: 411 seconds
Activated Clotting Time: 290 seconds

## 2018-11-16 SURGERY — CORONARY STENT INTERVENTION
Anesthesia: LOCAL

## 2018-11-16 MED ORDER — ASPIRIN EC 81 MG PO TBEC
81.0000 mg | DELAYED_RELEASE_TABLET | Freq: Every day | ORAL | Status: DC
  Administered 2018-11-17: 81 mg via ORAL
  Filled 2018-11-16: qty 1

## 2018-11-16 MED ORDER — HEPARIN (PORCINE) IN NACL 1000-0.9 UT/500ML-% IV SOLN
INTRAVENOUS | Status: DC | PRN
  Administered 2018-11-16: 500 mL

## 2018-11-16 MED ORDER — MIDAZOLAM HCL 2 MG/2ML IJ SOLN
INTRAMUSCULAR | Status: AC
  Filled 2018-11-16: qty 2

## 2018-11-16 MED ORDER — NITROGLYCERIN 1 MG/10 ML FOR IR/CATH LAB
INTRA_ARTERIAL | Status: DC | PRN
  Administered 2018-11-16: 100 ug via INTRACORONARY
  Administered 2018-11-16: 200 ug via INTRACORONARY

## 2018-11-16 MED ORDER — HEPARIN SODIUM (PORCINE) 1000 UNIT/ML IJ SOLN
INTRAMUSCULAR | Status: DC | PRN
  Administered 2018-11-16: 10000 [IU] via INTRAVENOUS

## 2018-11-16 MED ORDER — VERAPAMIL HCL 2.5 MG/ML IV SOLN
INTRAVENOUS | Status: AC
  Filled 2018-11-16: qty 2

## 2018-11-16 MED ORDER — FENTANYL CITRATE (PF) 100 MCG/2ML IJ SOLN
INTRAMUSCULAR | Status: AC
  Filled 2018-11-16: qty 2

## 2018-11-16 MED ORDER — HYDRALAZINE HCL 20 MG/ML IJ SOLN: 10.0000 mg | INTRAMUSCULAR | Status: AC | PRN

## 2018-11-16 MED ORDER — SODIUM CHLORIDE 0.9% FLUSH: 3.0000 mL | INTRAVENOUS | Status: DC | PRN

## 2018-11-16 MED ORDER — HEPARIN SODIUM (PORCINE) 1000 UNIT/ML IJ SOLN
INTRAMUSCULAR | Status: AC
  Filled 2018-11-16: qty 1

## 2018-11-16 MED ORDER — NITROGLYCERIN 1 MG/10 ML FOR IR/CATH LAB
INTRA_ARTERIAL | Status: AC
  Filled 2018-11-16: qty 10

## 2018-11-16 MED ORDER — IOHEXOL 350 MG/ML SOLN
INTRAVENOUS | Status: DC | PRN
  Administered 2018-11-16: 240 mL via INTRA_ARTERIAL

## 2018-11-16 MED ORDER — LIDOCAINE HCL (PF) 1 % IJ SOLN
INTRAMUSCULAR | Status: AC
  Filled 2018-11-16: qty 30

## 2018-11-16 MED ORDER — MIDAZOLAM HCL 2 MG/2ML IJ SOLN
INTRAMUSCULAR | Status: DC | PRN
  Administered 2018-11-16: 0.5 mg via INTRAVENOUS
  Administered 2018-11-16: 1 mg via INTRAVENOUS

## 2018-11-16 MED ORDER — HEPARIN SODIUM (PORCINE) 5000 UNIT/ML IJ SOLN
5000.0000 [IU] | Freq: Three times a day (TID) | INTRAMUSCULAR | Status: DC
  Administered 2018-11-16 – 2018-11-17 (×2): 5000 [IU] via SUBCUTANEOUS
  Filled 2018-11-16 (×2): qty 1

## 2018-11-16 MED ORDER — ACETAMINOPHEN 325 MG PO TABS: 650.0000 mg | ORAL_TABLET | ORAL | Status: DC | PRN

## 2018-11-16 MED ORDER — HEPARIN (PORCINE) IN NACL 1000-0.9 UT/500ML-% IV SOLN
INTRAVENOUS | Status: AC
  Filled 2018-11-16: qty 1000

## 2018-11-16 MED ORDER — SODIUM CHLORIDE 0.9% FLUSH
3.0000 mL | Freq: Two times a day (BID) | INTRAVENOUS | Status: DC
  Administered 2018-11-16: 3 mL via INTRAVENOUS

## 2018-11-16 MED ORDER — TICAGRELOR 90 MG PO TABS
90.0000 mg | ORAL_TABLET | Freq: Two times a day (BID) | ORAL | Status: DC
  Administered 2018-11-16 – 2018-11-17 (×2): 90 mg via ORAL
  Filled 2018-11-16 (×2): qty 1

## 2018-11-16 MED ORDER — SODIUM CHLORIDE 0.9 % IV SOLN: 250.0000 mL | INTRAVENOUS | Status: DC | PRN

## 2018-11-16 MED ORDER — OXYCODONE HCL 5 MG PO TABS
5.0000 mg | ORAL_TABLET | ORAL | Status: DC | PRN
  Administered 2018-11-16: 5 mg via ORAL
  Filled 2018-11-16: qty 1

## 2018-11-16 MED ORDER — ONDANSETRON HCL 4 MG/2ML IJ SOLN
INTRAMUSCULAR | Status: AC
  Filled 2018-11-16: qty 2

## 2018-11-16 MED ORDER — SODIUM CHLORIDE 0.9 % WEIGHT BASED INFUSION: 1.0000 mL/kg/h | INTRAVENOUS | Status: AC

## 2018-11-16 MED ORDER — LIDOCAINE HCL (PF) 1 % IJ SOLN
INTRAMUSCULAR | Status: DC | PRN
  Administered 2018-11-16: 2 mL via INTRADERMAL

## 2018-11-16 MED ORDER — ONDANSETRON HCL 4 MG/2ML IJ SOLN: 4.0000 mg | Freq: Four times a day (QID) | INTRAMUSCULAR | Status: DC | PRN

## 2018-11-16 MED ORDER — HEPARIN SODIUM (PORCINE) 1000 UNIT/ML IJ SOLN
INTRAMUSCULAR | Status: DC | PRN
  Administered 2018-11-16: 2500 [IU] via INTRAVENOUS

## 2018-11-16 MED ORDER — LABETALOL HCL 5 MG/ML IV SOLN: 10.0000 mg | INTRAVENOUS | Status: AC | PRN

## 2018-11-16 MED ORDER — FENTANYL CITRATE (PF) 100 MCG/2ML IJ SOLN
INTRAMUSCULAR | Status: DC | PRN
  Administered 2018-11-16 (×2): 25 ug via INTRAVENOUS

## 2018-11-16 MED ORDER — VERAPAMIL HCL 2.5 MG/ML IV SOLN
INTRAVENOUS | Status: DC | PRN
  Administered 2018-11-16: 10 mL via INTRA_ARTERIAL

## 2018-11-16 SURGICAL SUPPLY — 21 items
BALLN SAPPHIRE 2.0X12 (BALLOONS) ×2
BALLN SAPPHIRE 2.5X15 (BALLOONS) ×2
BALLN SAPPHIRE ~~LOC~~ 3.75X18 (BALLOONS) ×2 IMPLANT
BALLOON SAPPHIRE 2.0X12 (BALLOONS) ×1 IMPLANT
BALLOON SAPPHIRE 2.5X15 (BALLOONS) ×1 IMPLANT
CATH LAUNCHER 6FR JR4 (CATHETERS) ×2 IMPLANT
CATH VISTA GUIDE 6FR XBRCA (CATHETERS) ×2 IMPLANT
DEVICE RAD COMP TR BAND LRG (VASCULAR PRODUCTS) ×2 IMPLANT
ELECT DEFIB PAD ADLT CADENCE (PAD) ×2 IMPLANT
GLIDESHEATH SLEND A-KIT 6F 22G (SHEATH) ×2 IMPLANT
GUIDEWIRE INQWIRE 1.5J.035X260 (WIRE) ×1 IMPLANT
INQWIRE 1.5J .035X260CM (WIRE) ×2
KIT ENCORE 26 ADVANTAGE (KITS) ×2 IMPLANT
KIT HEART LEFT (KITS) ×2 IMPLANT
PACK CARDIAC CATHETERIZATION (CUSTOM PROCEDURE TRAY) ×2 IMPLANT
STENT RESOLUTE ONYX 2.0X15 (Permanent Stent) ×2 IMPLANT
STENT RESOLUTE ONYX 3.5X22 (Permanent Stent) ×2 IMPLANT
STENT RESOLUTE ONYX 3.5X38 (Permanent Stent) ×2 IMPLANT
TRANSDUCER W/STOPCOCK (MISCELLANEOUS) ×2 IMPLANT
TUBING CIL FLEX 10 FLL-RA (TUBING) ×2 IMPLANT
WIRE ASAHI PROWATER 180CM (WIRE) ×2 IMPLANT

## 2018-11-16 NOTE — H&P (View-Only) (Signed)
 Progress Note  Patient Name: Neiko A Medearis Date of Encounter: 11/16/2018  Primary Cardiologist: No primary care provider on file.   Subjective   Feels better this morning.  No more chest pain.  Breathing well. Radial cath site intact.  Inpatient Medications    Scheduled Meds: . aspirin EC  81 mg Oral Daily  . atorvastatin  80 mg Oral q1800  . finasteride  5 mg Oral Daily  . gabapentin  800 mg Oral QID  . heparin  5,000 Units Subcutaneous Q8H  . pregabalin  100 mg Oral BID  . sodium chloride flush  3 mL Intravenous Q12H  . ticagrelor  90 mg Oral BID   Continuous Infusions: . sodium chloride Stopped (11/16/18 0350)  . sodium chloride 1 mL/kg/hr (11/16/18 0451)   PRN Meds: sodium chloride, acetaminophen, diazepam, HYDROmorphone, nitroGLYCERIN, ondansetron (ZOFRAN) IV, sodium chloride flush   Vital Signs    Vitals:   11/15/18 2300 11/16/18 0500 11/16/18 0654 11/16/18 0728  BP: (!) 144/81 (!) 146/69  124/85  Pulse: 75 70    Resp: 17 17    Temp: 99.5 F (37.5 C)   98.4 F (36.9 C)  TempSrc: Oral   Oral  SpO2: 98% 96%    Weight:   99.5 kg   Height:        Intake/Output Summary (Last 24 hours) at 11/16/2018 0953 Last data filed at 11/16/2018 0700 Gross per 24 hour  Intake 1728.39 ml  Output 3100 ml  Net -1371.61 ml   Last 3 Weights 11/16/2018 11/14/2018 11/14/2018  Weight (lbs) 219 lb 4.8 oz 219 lb 2.2 oz 220 lb  Weight (kg) 99.474 kg 99.4 kg 99.791 kg      Telemetry    Sinus rhythm.- Personally Reviewed  ECG    Sinus rhythm rate 74 beats minute per minute with sinus arrhythmia and borderline rightward axis deviation.  Otherwise no ischemic changes.- Personally Reviewed  Physical Exam   GEN:  Sitting up on the side of the bed.  No acute distress.   Neck: No JVD, or carotid bruit Cardiac: RRR, (distant S1-S2).  No murmurs, rubs, or gallops.  Respiratory: Clear to auscultation bilaterally.  Nonlabored.  Good air movement.  No W/R/R GI: Soft, nontender,  non-distended.  No HJR/HSM MS: No edema; No deformity.  Radial cath slight without significant bruising or bleeding/hematoma. Neuro:  Nonfocal  Psych: Normal affect   Labs    Chemistry Recent Labs  Lab 11/14/18 1736 11/15/18 1215 11/16/18 0229  NA 138 136 138  K 3.7 4.3 4.0  CL 107 111 108  CO2 24 18* 21*  GLUCOSE 127* 153* 117*  BUN 17 16 14  CREATININE 1.13 0.97 1.06  CALCIUM 9.3 8.3* 9.0  PROT 7.0  --   --   ALBUMIN 4.0  --   --   AST 57*  --   --   ALT 48*  --   --   ALKPHOS 156*  --   --   BILITOT 1.4*  --   --   GFRNONAA >60 >60 >60  GFRAA >60 >60 >60  ANIONGAP 7 7 9     Hematology Recent Labs  Lab 11/15/18 0240 11/15/18 1215 11/16/18 0229  WBC 6.6 6.8 8.7  RBC 4.60 4.94 4.63  HGB 13.8 14.7 13.7  HCT 42.2 44.9 42.1  MCV 91.7 90.9 90.9  MCH 30.0 29.8 29.6  MCHC 32.7 32.7 32.5  RDW 13.3 13.3 13.4  PLT 206 187 203      Cardiac Enzymes Recent Labs  Lab 11/14/18 1736 11/14/18 2115 11/15/18 0240 11/15/18 1215  TROPONINI 4.68* 5.15* 3.31* 1.93*   No results for input(s): TROPIPOC in the last 168 hours.   BNPNo results for input(s): BNP, PROBNP in the last 168 hours.   DDimer No results for input(s): DDIMER in the last 168 hours.   Radiology    Dg Chest 2 View  Result Date: 11/14/2018 CLINICAL DATA:  Left-sided chest pain and shortness-of-breath beginning yesterday. Dry cough. EXAM: CHEST - 2 VIEW COMPARISON:  04/15/2014 FINDINGS: Lungs are adequately inflated with mild prominence of the central bronchovascular markings. No lobar consolidation or effusion. Cardiomediastinal silhouette and remainder of the exam is unchanged. IMPRESSION: Mild prominence of the central bronchovascular markings which may be due to acute bronchitic process. Electronically Signed   By: Elberta Fortis M.D.   On: 11/14/2018 18:10    Cardiac Studies   Cath: 11/15/18: Severe 2 vessel obstructive CAD:. Ost-pCx 95% (DES PCI  STENT SYNERGY DES 3.5X28) &p-mCx 100% (DES PCI STENT  SYNERGY DES 2.25X32). pRCA 90%, mRCA 85% & rPDA 90%.dLAD ~40%.  EF 55 to 60%.  Moderately elevated LVEDP.  No R WMA.  Plan staged PCI to 3 RCA-PDA lesions 4/1   Diagnostic       Intervention        Patient Profile     68 y.o. male with PMH of HL and family hx of early CAD who presented with several episodes of chest pain with exertion.   Assessment & Plan    1. NSTEMI: Troponin peaked at 5.15. Underwent cardiac cath yesterday noted above with 2v disease. Successful PCI/DES x2 to the p/d Lcx, with plans for staged intervention to the RCA today. Placed on DAPT with ASA/Brilinta. Stable renal function this morning. -- Blood pressures higher today. Will plan to add low dose BB  2. HL: LDL 63, Trig 292. Now on high dose statin  For questions or updates, please contact [facility] Please consult [email or web address] for contact info under      Signed, Bryan Lemma, MD  11/16/2018, 9:53 AM

## 2018-11-16 NOTE — Interval H&P Note (Signed)
Cath Lab Visit (complete for each Cath Lab visit)  Clinical Evaluation Leading to the Procedure:   ACS: Yes.    Non-ACS:    Anginal Classification: CCS Smith  Anti-ischemic medical therapy: Minimal Therapy (1 class of medications)  Non-Invasive Test Results: No non-invasive testing performed  Prior CABG: No previous CABG      History and Physical Interval Note:  11/16/2018 10:05 AM   Discussed the procedure with the patient.  Complex anatomy occluding chronic total occlusion of the circumflex which was recanalized yesterday.  Diffusely diseased proximal to distal RCA including the PDA.  This will require virtual reconstruction of the right coronary with a PTCA of the PDA and if acceptable no stent.  If necessary, a stent will be placed.  He will then need a very long stent from the distal RCA back to the mid vessel.  And finally he will need a shorter stent in the proximal RCA.  Anatomy dictates slightly higher than typical risk of acute ischemic complication.  Arthur Smith  has presented today for surgery, with the diagnosis of cad.  The various methods of treatment have been discussed with the patient and family. After consideration of risks, benefits and other options for treatment, the patient has consented to  Procedure(s): CORONARY STENT INTERVENTION (N/A) as a surgical intervention.  The patient's history has been reviewed, patient examined, no change in status, stable for surgery.  I have reviewed the patient's chart and labs.  Questions were answered to the patient's satisfaction.     Arthur Smith

## 2018-11-16 NOTE — Plan of Care (Signed)
  Problem: Clinical Measurements: Goal: Ability to maintain clinical measurements within normal limits will improve Outcome: Progressing Goal: Diagnostic test results will improve Outcome: Progressing Goal: Cardiovascular complication will be avoided Outcome: Progressing   Problem: Nutrition: Goal: Adequate nutrition will be maintained Outcome: Progressing   Problem: Coping: Goal: Level of anxiety will decrease Outcome: Progressing   Problem: Elimination: Goal: Will not experience complications related to bowel motility Outcome: Progressing Goal: Will not experience complications related to urinary retention Outcome: Progressing   Problem: Safety: Goal: Ability to remain free from injury will improve Outcome: Progressing   Problem: Skin Integrity: Goal: Risk for impaired skin integrity will decrease Outcome: Progressing   Problem: Education: Goal: Understanding of CV disease, CV risk reduction, and recovery process will improve Outcome: Progressing Goal: Individualized Educational Video(s) Outcome: Progressing   Problem: Activity: Goal: Ability to return to baseline activity level will improve Outcome: Progressing   Problem: Cardiovascular: Goal: Ability to achieve and maintain adequate cardiovascular perfusion will improve Outcome: Progressing Goal: Vascular access site(s) Level 0-1 will be maintained Outcome: Progressing   Problem: Health Behavior/Discharge Planning: Goal: Ability to safely manage health-related needs after discharge will improve Outcome: Progressing

## 2018-11-16 NOTE — Progress Notes (Signed)
Progress Note  Patient Name: Arthur Smith Date of Encounter: 11/16/2018  Primary Cardiologist: No primary care provider on file.   Subjective   Feels better this morning.  No more chest pain.  Breathing well. Radial cath site intact.  Inpatient Medications    Scheduled Meds: . aspirin EC  81 mg Oral Daily  . atorvastatin  80 mg Oral q1800  . finasteride  5 mg Oral Daily  . gabapentin  800 mg Oral QID  . heparin  5,000 Units Subcutaneous Q8H  . pregabalin  100 mg Oral BID  . sodium chloride flush  3 mL Intravenous Q12H  . ticagrelor  90 mg Oral BID   Continuous Infusions: . sodium chloride Stopped (11/16/18 0350)  . sodium chloride 1 mL/kg/hr (11/16/18 0451)   PRN Meds: sodium chloride, acetaminophen, diazepam, HYDROmorphone, nitroGLYCERIN, ondansetron (ZOFRAN) IV, sodium chloride flush   Vital Signs    Vitals:   11/15/18 2300 11/16/18 0500 11/16/18 0654 11/16/18 0728  BP: (!) 144/81 (!) 146/69  124/85  Pulse: 75 70    Resp: 17 17    Temp: 99.5 F (37.5 C)   98.4 F (36.9 C)  TempSrc: Oral   Oral  SpO2: 98% 96%    Weight:   99.5 kg   Height:        Intake/Output Summary (Last 24 hours) at 11/16/2018 0953 Last data filed at 11/16/2018 0700 Gross per 24 hour  Intake 1728.39 ml  Output 3100 ml  Net -1371.61 ml   Last 3 Weights 11/16/2018 11/14/2018 11/14/2018  Weight (lbs) 219 lb 4.8 oz 219 lb 2.2 oz 220 lb  Weight (kg) 99.474 kg 99.4 kg 99.791 kg      Telemetry    Sinus rhythm.- Personally Reviewed  ECG    Sinus rhythm rate 74 beats minute per minute with sinus arrhythmia and borderline rightward axis deviation.  Otherwise no ischemic changes.- Personally Reviewed  Physical Exam   GEN:  Sitting up on the side of the bed.  No acute distress.   Neck: No JVD, or carotid bruit Cardiac: RRR, (distant S1-S2).  No murmurs, rubs, or gallops.  Respiratory: Clear to auscultation bilaterally.  Nonlabored.  Good air movement.  No W/R/R GI: Soft, nontender,  non-distended.  No HJR/HSM MS: No edema; No deformity.  Radial cath slight without significant bruising or bleeding/hematoma. Neuro:  Nonfocal  Psych: Normal affect   Labs    Chemistry Recent Labs  Lab 11/14/18 1736 11/15/18 1215 11/16/18 0229  NA 138 136 138  K 3.7 4.3 4.0  CL 107 111 108  CO2 24 18* 21*  GLUCOSE 127* 153* 117*  BUN 17 16 14   CREATININE 1.13 0.97 1.06  CALCIUM 9.3 8.3* 9.0  PROT 7.0  --   --   ALBUMIN 4.0  --   --   AST 57*  --   --   ALT 48*  --   --   ALKPHOS 156*  --   --   BILITOT 1.4*  --   --   GFRNONAA >60 >60 >60  GFRAA >60 >60 >60  ANIONGAP 7 7 9      Hematology Recent Labs  Lab 11/15/18 0240 11/15/18 1215 11/16/18 0229  WBC 6.6 6.8 8.7  RBC 4.60 4.94 4.63  HGB 13.8 14.7 13.7  HCT 42.2 44.9 42.1  MCV 91.7 90.9 90.9  MCH 30.0 29.8 29.6  MCHC 32.7 32.7 32.5  RDW 13.3 13.3 13.4  PLT 206 187 203  Cardiac Enzymes Recent Labs  Lab 11/14/18 1736 11/14/18 2115 11/15/18 0240 11/15/18 1215  TROPONINI 4.68* 5.15* 3.31* 1.93*   No results for input(s): TROPIPOC in the last 168 hours.   BNPNo results for input(s): BNP, PROBNP in the last 168 hours.   DDimer No results for input(s): DDIMER in the last 168 hours.   Radiology    Dg Chest 2 View  Result Date: 11/14/2018 CLINICAL DATA:  Left-sided chest pain and shortness-of-breath beginning yesterday. Dry cough. EXAM: CHEST - 2 VIEW COMPARISON:  04/15/2014 FINDINGS: Lungs are adequately inflated with mild prominence of the central bronchovascular markings. No lobar consolidation or effusion. Cardiomediastinal silhouette and remainder of the exam is unchanged. IMPRESSION: Mild prominence of the central bronchovascular markings which may be due to acute bronchitic process. Electronically Signed   By: Elberta Fortis M.D.   On: 11/14/2018 18:10    Cardiac Studies   Cath: 11/15/18: Severe 2 vessel obstructive CAD:. Ost-pCx 95% (DES PCI  STENT SYNERGY DES 3.5X28) &p-mCx 100% (DES PCI STENT  SYNERGY DES 2.25X32). pRCA 90%, mRCA 85% & rPDA 90%.dLAD ~40%.  EF 55 to 60%.  Moderately elevated LVEDP.  No R WMA.  Plan staged PCI to 3 RCA-PDA lesions 4/1   Diagnostic       Intervention        Patient Profile     68 y.o. male with PMH of HL and family hx of early CAD who presented with several episodes of chest pain with exertion.   Assessment & Plan    1. NSTEMI: Troponin peaked at 5.15. Underwent cardiac cath yesterday noted above with 2v disease. Successful PCI/DES x2 to the p/d Lcx, with plans for staged intervention to the RCA today. Placed on DAPT with ASA/Brilinta. Stable renal function this morning. -- Blood pressures higher today. Will plan to add low dose BB  2. HL: LDL 63, Trig 292. Now on high dose statin  For questions or updates, please contact [facility] Please consult [email or web address] for contact info under      Signed, Bryan Lemma, MD  11/16/2018, 9:53 AM

## 2018-11-16 NOTE — TOC Benefit Eligibility Note (Signed)
Transition of Care [facility]) Benefit Eligibility Note    Patient Details  Name: Arthur Smith MRN: [id number] Date of Birth: 06-10-1951   Medication/Dose: BRILINTA  90 MG BID( )  Covered?: Yes  Tier: 2 Drug  Prescription Coverage Preferred Pharmacy: YES([facility] )  Spoke with Person/Company/Phone Number:: SHARON([facility] [phone] )  Co-Pay: $ 93.79  Prior Approval: No  Deductible: Met       Memory Argue Phone Number: 11/16/2018, 4:05 PM

## 2018-11-16 NOTE — Progress Notes (Signed)
CARDIAC REHAB PHASE I  Rhythm: 70 SR  BP: 124/85 O2: 99% RA    Assisted pt to bathroom. Dr arrived to inform pt of plan of care for today. Pt scheduled for cath lab soon. Education completed with pt. Reviewed stent card, MI booklet, antiplatelet medicine, restrictions, risk factors, heart healthy nutrition, exercise guidelines, and cardiac rehab phase II. Pt verbalized understanding. Will send referral to [place] [facility] Phase II per pt's request. Cardiac Rehab will follow up with pt in morning.   [phone]  York Cerise MS, ACSM CEP  9:30 AM 11/16/2018

## 2018-11-16 NOTE — CV Procedure (Signed)
   PCI performed via right radial approach using vascular ultrasound for access.  Successful complex PCI resulting in PDA stent, and overlapping right coronary stents from distal to proximal.  Postdilatation diameter for main body of right coronary 3.75 mm at high pressure PDA stent 2.0 deployed at 14 atm.  Implant form was Onyx.  95%, 80%, and 90% (proximal, mid to distal, and PDA respectively) reduced to 0%, 0%, and 0% with TIMI grade III flow.  No complications.  Aspirin and Brilinta for 12 months with consideration of dropping aspirin after 3 months.

## 2018-11-17 ENCOUNTER — Encounter (HOSPITAL_COMMUNITY): Payer: Self-pay | Admitting: Interventional Cardiology

## 2018-11-17 LAB — BASIC METABOLIC PANEL
Anion gap: 7 (ref 5–15)
BUN: 14 mg/dL (ref 8–23)
CO2: 20 mmol/L — ABNORMAL LOW (ref 22–32)
Calcium: 8.4 mg/dL — ABNORMAL LOW (ref 8.9–10.3)
Chloride: 110 mmol/L (ref 98–111)
Creatinine, Ser: 1.06 mg/dL (ref 0.61–1.24)
GFR calc Af Amer: >60 mL/min (ref >60)
GFR calc non Af Amer: >60 mL/min (ref >60)
Glucose, Bld: 141 mg/dL — ABNORMAL HIGH (ref 70–99)
Potassium: 3.6 mmol/L (ref 3.5–5.1)
Sodium: 137 mmol/L (ref 135–145)

## 2018-11-17 LAB — CBC
HCT: 39.3 % (ref 39.0–52.0)
Hemoglobin: 12.9 g/dL — ABNORMAL LOW (ref 13.0–17.0)
MCH: 29.9 pg (ref 26.0–34.0)
MCHC: 32.8 g/dL (ref 30.0–36.0)
MCV: 91.2 fL (ref 80.0–100.0)
Platelets: 213 10*3/uL (ref 150–400)
RBC: 4.31 MIL/uL (ref 4.22–5.81)
RDW: 13.2 % (ref 11.5–15.5)
WBC: 8.5 10*3/uL (ref 4.0–10.5)
nRBC: 0 % (ref 0.0–0.2)

## 2018-11-17 MED ORDER — TICAGRELOR 90 MG PO TABS: 90.0000 mg | ORAL_TABLET | Freq: Two times a day (BID) | ORAL | 0 refills | Status: DC

## 2018-11-17 MED ORDER — CARVEDILOL 3.125 MG PO TABS: 3.1250 mg | ORAL_TABLET | Freq: Two times a day (BID) | ORAL | 3 refills | Status: DC

## 2018-11-17 MED ORDER — ATORVASTATIN CALCIUM 80 MG PO TABS: 80.0000 mg | ORAL_TABLET | Freq: Every day | ORAL | 11 refills | Status: DC

## 2018-11-17 MED ORDER — NITROGLYCERIN 0.4 MG SL SUBL: 0.4000 mg | SUBLINGUAL_TABLET | SUBLINGUAL | 6 refills | Status: DC | PRN

## 2018-11-17 MED ORDER — CARVEDILOL 3.125 MG PO TABS
3.1250 mg | ORAL_TABLET | Freq: Two times a day (BID) | ORAL | Status: DC
  Administered 2018-11-17: 3.125 mg via ORAL
  Filled 2018-11-17: qty 1

## 2018-11-17 MED ORDER — ASPIRIN EC 81 MG PO TBEC: 81.0000 mg | DELAYED_RELEASE_TABLET | ORAL | Status: AC

## 2018-11-17 MED ORDER — TICAGRELOR 90 MG PO TABS: 90.0000 mg | ORAL_TABLET | Freq: Two times a day (BID) | ORAL | 12 refills | Status: DC

## 2018-11-17 MED ORDER — FINASTERIDE 5 MG PO TABS: 5.0000 mg | ORAL_TABLET | Freq: Every day | ORAL | 11 refills | Status: DC

## 2018-11-17 MED FILL — Heparin Sod (Porcine)-NaCl IV Soln 1000 Unit/500ML-0.9%: INTRAVENOUS | Qty: 500 | Status: AC

## 2018-11-17 MED FILL — NITROGLYCERIN 0.4 MG TAB SL: 0.4 | 8 days supply | Qty: 25 | Fill #0 | Status: TO

## 2018-11-17 MED FILL — ATORVASTATIN 80 MG TABLET: 80 | 30 days supply | Qty: 30 | Fill #0

## 2018-11-17 MED FILL — CARVEDILOL 3.125 MG TABLET: 3.125 | 30 days supply | Qty: 60 | Fill #0 | Status: TO

## 2018-11-17 MED FILL — BRILINTA 90 MG TABLET: 90 | 30 days supply | Qty: 60 | Fill #0

## 2018-11-17 NOTE — Discharge Instructions (Signed)
YOUR CARDIOLOGY TEAM HAS ARRANGED FOR AN E-VISIT FOR YOUR APPOINTMENT - PLEASE REVIEW IMPORTANT INFORMATION BELOW SEVERAL DAYS PRIOR TO YOUR APPOINTMENT ° °Due to the recent COVID-19 pandemic, we are transitioning in-person office visits to tele-medicine visits in an effort to decrease unnecessary exposure to our patients and staff. Medicare and most insurances are covering these visits without a copay needed. We also encourage you to sign up for MyChart if you have not already done so. You will need a smartphone if possible. For patients that do not have this, we can still complete the visit using a regular telephone but do prefer a smartphone to enable video when possible. You may have a close family member that lives with you that can help. If possible, we also ask that you have a blood pressure cuff and scale at home to measure your blood pressure, heart rate and weight prior to your scheduled appointment. Patients with clinical needs that need an in-person evaluation and testing will still be able to come to the office if absolutely necessary. If you have any questions, feel free to call our office. ° ° ° °IF YOU HAVE A SMARTPHONE, PLEASE DOWNLOAD THE WEBEX APP TO YOUR SMARTPHONE ° °- If Apple, go to App Store and type in WebEx in the search bar. Download [employer] Webex Meetings, the blue/green circle. The app is free but as with any other app download, your phone may require you to verify saved payment information or Apple password. You do NOT have to create a WebEx account. ° °- If Android, go to Google Play Store and type in WebEx in the search bar. Download [employer] Webex Meetings, the blue/green circle. The app is free but as with any other app download, your phone may require you to verify saved payment information or Android password. You do NOT have to create a WebEx account. ° °It is very helpful to have this downloaded before your visit. ° ° ° °2-3 DAYS BEFORE YOUR APPOINTMENT ° °You will receive a  telephone call from one of our [facility] team members - your caller ID may say "Unknown caller." If this is a video visit, we will confirm that you have been able to download the WebEx app. We will remind you check your blood pressure, heart rate and weight prior to your scheduled appointment. If you have an Apple Watch or Kardia, please upload any pertinent ECG strips the day before or morning of your appointment to MyChart. Our staff will also make sure you have reviewed the consent and agree to move forward with your scheduled tele-health visit.  ° ° ° °THE DAY OF YOUR APPOINTMENT ° °Approximately 15 minutes prior to your scheduled appointment, you will receive a telephone call from one of [facility] team - your caller ID may say "Unknown caller."  Our staff will confirm medications, vital signs for the day and any symptoms you may be experiencing. Please have this information available prior to the time of visit start. It may also be helpful for you to have a pad of paper and pen handy for any instructions given during your visit. They will also walk you through joining the WebEx smartphone meeting if this is a video visit. ° ° ° °CONSENT FOR TELE-HEALTH VISIT - PLEASE REVIEW ° °I hereby voluntarily request, consent and authorize [facility] and its employed or contracted physicians, physician assistants, nurse practitioners or other licensed health care professionals (the Practitioner), to provide me with telemedicine health care services (the “Services") as   deemed necessary by the treating Practitioner. I acknowledge and consent to receive the Services by the Practitioner via telemedicine. I understand that the telemedicine visit will involve communicating with the Practitioner through live audiovisual communication technology and the disclosure of certain medical information by electronic transmission. I acknowledge that I have been given the opportunity to request an in-person assessment or other available  alternative prior to the telemedicine visit and am voluntarily participating in the telemedicine visit. ° °I understand that I have the right to withhold or withdraw my consent to the use of telemedicine in the course of my care at any time, without affecting my right to future care or treatment, and that the Practitioner or I may terminate the telemedicine visit at any time. I understand that I have the right to inspect all information obtained and/or recorded in the course of the telemedicine visit and may receive copies of available information for a reasonable fee.  I understand that some of the potential risks of receiving the Services via telemedicine include:  °• Delay or interruption in medical evaluation due to technological equipment failure or disruption; °• Information transmitted may not be sufficient (e.g. poor resolution of images) to allow for appropriate medical decision making by the Practitioner; and/or  °• In rare instances, security protocols could fail, causing a breach of personal health information. ° °Furthermore, I acknowledge that it is my responsibility to provide information about my medical history, conditions and care that is complete and accurate to the best of my ability. I acknowledge that Practitioner's advice, recommendations, and/or decision may be based on factors not within their control, such as incomplete or inaccurate data provided by me or distortions of diagnostic images or specimens that may result from electronic transmissions. I understand that the practice of medicine is not an exact science and that Practitioner makes no warranties or guarantees regarding treatment outcomes. I acknowledge that I will receive a copy of this consent concurrently upon execution via email to the email address I last provided but may also request a printed copy by calling the office of [facility].   ° °I understand that my insurance will be billed for this visit.  ° °I have read or had  this consent read to me. °• I understand the contents of this consent, which adequately explains the benefits and risks of the Services being provided via telemedicine.  °• I have been provided ample opportunity to ask questions regarding this consent and the Services and have had my questions answered to my satisfaction. °• I give my informed consent for the services to be provided through the use of telemedicine in my medical care ° °By participating in this telemedicine visit I agree to the above. ° ° °Coronary Angiogram With Stent, Care After °This sheet gives you information about how to care for yourself after your procedure. Your health care provider may also give you more specific instructions. If you have problems or questions, contact your health care provider. °What can I expect after the procedure? °After your procedure, it is common to have: °· Bruising in the area where a small, thin tube (catheter) was inserted. This usually fades within 1-2 weeks. °· Blood collecting in the tissue (hematoma) that may be painful to the touch. It should usually decrease in size and tenderness within 1-2 weeks. °Follow these instructions at home: °Insertion area care °· Do not take baths, swim, or use a hot tub until your health care provider approves. °· You may shower   24-48 hours after the procedure or as directed by your health care provider. °· Follow instructions from your health care provider about how to take care of your incision. Make sure you: °? Wash your hands with soap and water before you change your bandage (dressing). If soap and water are not available, use hand sanitizer. °? Change your dressing as told by your health care provider. °? Leave stitches (sutures), skin glue, or adhesive strips in place. These skin closures may need to stay in place for 2 weeks or longer. If adhesive strip edges start to loosen and curl up, you may trim the loose edges. Do not remove adhesive strips completely unless your  health care provider tells you to do that. °· Remove the bandage (dressing) and gently wash the catheter insertion site with plain soap and water. °· Pat the area dry with a clean towel. Do not rub the area, because that may cause bleeding. °· Do not apply powder or lotion to the incision area. °· Check your incision area every day for signs of infection. Check for: °? More redness, swelling, or pain. °? More fluid or blood. °? Warmth. °? Pus or a bad smell. °Activity °· Do not drive for 24 hours if you were given a medicine to help you relax (sedative). °· Do not lift anything that is heavier than 10 lb (4.5 kg) for 5 days after your procedure or as directed by your health care provider. °· Ask your health care provider when it is okay for you: °? To return to work or school. °? To resume usual physical activities or sports. °? To resume sexual activity. °Eating and drinking ° °· Eat a heart-healthy diet. This should include plenty of fresh fruits and vegetables. °· Avoid the following types of food: °? Food that is high in salt. °? Canned or highly processed food. °? Food that is high in saturated fat or sugar. °? Fried food. °· Limit alcohol intake to no more than 1 drink a day for non-pregnant women and 2 drinks a day for men. One drink equals 12 oz of beer, 5 oz of wine, or 1½ oz of hard liquor. °Lifestyle ° °· Do not use any products that contain nicotine or tobacco, such as cigarettes and e-cigarettes. If you need help quitting, ask your health care provider. °· Take steps to manage and control your weight. °· Get regular exercise. °· Manage your blood pressure. °· Manage other health problems, such as diabetes. °General instructions °· Take over-the-counter and prescription medicines only as told by your health care provider. Blood thinners may be prescribed after your procedure to improve blood flow through the stent. °· If you need an MRI after your heart stent has been placed, be sure to tell the health  care provider who orders the MRI that you have a heart stent. °· Keep all follow-up visits as directed by your health care provider. This is important. °Contact a health care provider if: °· You have a fever. °· You have chills. °· You have increased bleeding from the catheter insertion area. Hold pressure on the area. °Get help right away if: °· You develop chest pain or shortness of breath. °· You feel faint or you pass out. °· You have unusual pain at the catheter insertion area. °· You have redness, warmth, or swelling at the catheter insertion area. °· You have drainage (other than a small amount of blood on the dressing) from the catheter insertion area. °· The catheter   insertion area is bleeding, and the bleeding does not stop after 30 minutes of holding steady pressure on the area. °· You develop bleeding from any other place, such as from your rectum. There may be bright red blood in your urine or stool, or it may appear as black, tarry stool. °This information is not intended to replace advice given to you by your health care provider. Make sure you discuss any questions you have with your health care provider. °Document Released: 02/20/2005 Document Revised: 04/30/2016 Document Reviewed: 04/30/2016 °[employer] Interactive Patient Education © 2019 [employer] ° °

## 2018-11-17 NOTE — Discharge Summary (Addendum)
Discharge Summary    Patient ID: Arthur Smith,  MRN: [id number], DOB/AGE: 20-Apr-1951 68 y.o.  Admit date: 11/14/2018 Discharge date: 11/17/2018  Primary Care Provider: Lavone Orn Primary Cardiologist: Dr. Sallyanne Kuster  Discharge Diagnoses    Principal Problem:   NSTEMI (non-ST elevated myocardial infarction) [facility]) Active Problems:   Hyperlipidemia LDL goal <70   Allergies Allergies  Allergen Reactions  . Eggs Or Egg-Derived Products Other (See Comments)    Egg yolks cause stomach cramps and diarrhea  . Lactose Intolerance (Gi) Other (See Comments)    Causes stomach cramps and diarrhea    Diagnostic Studies/Procedures    Cath: 11/15/2018   Prox RCA lesion is 90% stenosed.  Mid RCA lesion is 85% stenosed.  RPDA lesion is 90% stenosed.  Dist LAD lesion is 40% stenosed.  Ost Cx to Prox Cx lesion is 95% stenosed.  A drug-eluting stent was successfully placed using a STENT SYNERGY DES 3.5X28.  Prox Cx to Mid Cx lesion is 100% stenosed.  Post intervention, there is a 0% residual stenosis.  A drug-eluting stent was successfully placed using a STENT SYNERGY DES 2.25X32.  Post intervention, there is a 0% residual stenosis.  The left ventricular systolic function is normal.  LV end diastolic pressure is moderately elevated.  The left ventricular ejection fraction is 55-65% by visual estimate.   1. Severe 2 vessel obstructive CAD.    - 95% proximal LCx. 100% distal LCx- this is the culprit lesion    - 90% proximal RCA, 85% segmental mid RCA, 90% PDA 2. Normal LV function 3. Moderately elevated LVEDP 4. Successful PCI of the proximal and distal LCX with DES x 2.   Plan: DAPT for one year. Hydrate and observe renal function. Planned for stage PCI of the RCA and PDA in next 48 hours.   Cath: 11/16/2018   A stent was successfully placed.  A stent was successfully placed.    Successful RCA PCI resulting in proximal 90% stenosis, mid to distal 85% stenosis  and PDA 90% stenosis being reduced to 0%, 0%, and 0% using Onyx drug-eluting stents.  TIMI grade III flow was noted.  The RCA proper was treated with 3.5 mm Onyx stents postdilated to 3.75 mm.  The PDA was treated with 2.0 Onyx stent deployed at 14 atm.  RECOMMENDATIONS:   Aspirin and Brilinta for at least 12 months.  Consider dropping aspirin after 3 months if any issue with bleeding.  Aggressive risk factor modification.  Anticipate discharge in a.m.  _____________   History of Present Illness     Arthur Smith is a 68 yo male with PMH of HL and family hx of premature CAD who was at rest the day prior to admission when he experienced a sudden sharp precordial pain radiating upwards towards his left clavicle, left side of the neck and into his lower jaw.  The pain was so intense that he could not tolerate his lower dentures and had to take them out. Symptoms lasted for about an hour and resolve spontaneously.  He continued to feel uncomfortable throughout the evening but was able to sleep through the night.  The morning of admission he began experiencing the chest discomfort again with the slightest physical activity.  His wife, Shawn Route, who is an Therapist, sports at [employer], convinced him to come to the hospital.  His ECG was unremarkable and showed sinus rhythm without repolarization abnormalities, but the troponin I was 4.68.  He was asymptomatic at rest.  Denied any dyspnea, diaphoresis, palpitations, dizziness or syncope, focal neurological complaints or claudication or edema.  He has chronic problems with lumbar stenosis and secondary neuropathy for which he takes a variety of different medications and takes atorvastatin for hyperlipidemia.  He denied any recent fever, chills, rhinorrhea, cough or sneezing, contact with patients with known viral illness or any recent travel.  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 Given symptoms he was admitted with plans to  undergo cardiac cath.   Hospital Course     He was placed on IV heparin along with aspirin and high-dose statin.  Underwent cardiac catheterization on 11/15/2018 which showed severe two-vessel obstructive CAD with a 95 proximal left circumflex, 100% distal circumflex felt to be the culprit lesion with PCI/DES x2 with residual disease in the proximal RCA which was planned for staged intervention.  He was placed on DAPT with aspirin and Brilinta post intervention.  He was placed on IV fluids for hydration with plans to follow-up with renal function the following day for staged intervention to the RCA/PDA.  Morning labs were stable the following day and he underwent successful PCI/DES x 2 to the proximal/mid RCA, and DES x1 to the PDA.  Of note LV gram showed normal LVEF.  Troponin peaked at 5.15.  LDL 63, triglycerides 292.  Low-dose beta-blocker was added.  He did well post cardiac cath with no residual chest pain.  Cardiac rehab consulted for phase 1 rehabilitation while inpatient.  [name] was seen by Dr. Ellyn Hack and determined stable for discharge home. Follow up in the office has been arranged. Medications are listed below.   _____________  Discharge Vitals Blood pressure 108/62, pulse 79, temperature (!) 97.4 F (36.3 C), temperature source Oral, resp. rate 15, height '5\' 9"'  (1.753 m), weight 99.5 kg, SpO2 97 %.  Filed Weights   11/14/18 1715 11/14/18 2053 11/16/18 0654  Weight: 99.8 kg 99.4 kg 99.5 kg  General appearance: alert, cooperative, appears stated age and no distress Neck: no carotid bruit and no JVD Lungs: clear to auscultation bilaterally and normal percussion bilaterally Heart: regular rate and rhythm, S1, S2 normal, no murmur, click, rub or gallop Abdomen: soft, non-tender; bowel sounds normal; no masses,  no organomegaly Extremities: extremities normal, atraumatic, no cyanosis or edema and R radial cath site stable - minimal ecchymoses. Pulses: 2+ and symmetric  Neurologic: Grossly normal   Labs & Radiologic Studies    CBC Recent Labs    11/14/18 1736  11/16/18 0229 11/17/18 0243  WBC 9.8   < > 8.7 8.5  NEUTROABS 5.7  --   --   --   HGB 14.6   < > 13.7 12.9*  HCT 44.4   < > 42.1 39.3  MCV 92.7   < > 90.9 91.2  PLT 226   < > 203 213   < > = values in this interval not displayed.   Basic Metabolic Panel Recent Labs    11/16/18 0229 11/17/18 0243  NA 138 137  K 4.0 3.6  CL 108 110  CO2 21* 20*  GLUCOSE 117* 141*  BUN 14 14  CREATININE 1.06 1.06  CALCIUM 9.0 8.4*   Liver Function Tests Recent Labs    11/14/18 1736  AST 57*  ALT 48*  ALKPHOS 156*  BILITOT 1.4*  PROT 7.0  ALBUMIN 4.0   No results for input(s): LIPASE, AMYLASE in the last 72 hours. Cardiac Enzymes Recent Labs  11/14/18 2115 11/15/18 0240 11/15/18 1215  TROPONINI 5.15* 3.31* 1.93*   BNP Invalid input(s): POCBNP D-Dimer No results for input(s): DDIMER in the last 72 hours. Hemoglobin A1C No results for input(s): HGBA1C in the last 72 hours. Fasting Lipid Panel Recent Labs    11/15/18 0240  CHOL 153  HDL 32*  LDLCALC 63  TRIG 292*  CHOLHDL 4.8   Thyroid Function Tests No results for input(s): TSH, T4TOTAL, T3FREE, THYROIDAB in the last 72 hours.  Invalid input(s): FREET3 _____________  Dg Chest 2 View  Result Date: 11/14/2018 CLINICAL DATA:  Left-sided chest pain and shortness-of-breath beginning yesterday. Dry cough. EXAM: CHEST - 2 VIEW COMPARISON:  04/15/2014 FINDINGS: Lungs are adequately inflated with mild prominence of the central bronchovascular markings. No lobar consolidation or effusion. Cardiomediastinal silhouette and remainder of the exam is unchanged. IMPRESSION: Mild prominence of the central bronchovascular markings which may be due to acute bronchitic process. Electronically Signed   By: Marin Olp M.D.   On: 11/14/2018 18:10   Disposition   Pt is being discharged home today in good condition.  Follow-up Plans &  Appointments    Follow-up Information    Almyra Deforest, [place] Follow up on 11/25/2018.   Specialties:  Cardiology, Radiology Why:  at 10am for your follow up appt. This will be a tele-health visit.  Contact information: [address]  [place] [address] [phone]          Discharge Instructions    Amb Referral to Cardiac Rehabilitation   Complete by:  As directed    Diagnosis:   NSTEMI Coronary Stents     Call MD for:  redness, tenderness, or signs of infection (pain, swelling, redness, odor or green/yellow discharge around incision site)   Complete by:  As directed    Diet - low sodium heart healthy   Complete by:  As directed    Discharge instructions   Complete by:  As directed    Radial Site Care Refer to this sheet in the next few weeks. These instructions provide you with information on caring for yourself after your procedure. Your caregiver may also give you more specific instructions. Your treatment has been planned according to current medical practices, but problems sometimes occur. Call your caregiver if you have any problems or questions after your procedure. HOME CARE INSTRUCTIONS You may shower the day after the procedure.Remove the bandage (dressing) and gently wash the site with plain soap and water.Gently pat the site dry.  Do not apply powder or lotion to the site.  Do not submerge the affected site in water for 3 to 5 days.  Inspect the site at least twice daily.  Do not flex or bend the affected arm for 24 hours.  No lifting over 5 pounds (2.3 kg) for 5 days after your procedure.  Do not drive home if you are discharged the same day of the procedure. Have someone else drive you.  You may drive 24 hours after the procedure unless otherwise instructed by your caregiver.  What to expect: Any bruising will usually fade within 1 to 2 weeks.  Blood that collects in the tissue (hematoma) may be painful to the touch. It should usually decrease in size  and tenderness within 1 to 2 weeks.  SEEK IMMEDIATE MEDICAL CARE IF: You have unusual pain at the radial site.  You have redness, warmth, swelling, or pain at the radial site.  You have drainage (other than a small amount of  blood on the dressing).  You have chills.  You have a fever or persistent symptoms for more than 72 hours.  You have a fever and your symptoms suddenly get worse.  Your arm becomes pale, cool, tingly, or numb.  You have heavy bleeding from the site. Hold pressure on the site.   PLEASE DO NOT MISS ANY DOSES OF YOUR BRILINTA!!!!! Also keep a log of you blood pressures and bring back to your follow up appt. Please call the office with any questions.   Patients taking blood thinners should generally stay away from medicines like ibuprofen, Advil, Motrin, naproxen, and Aleve due to risk of stomach bleeding. You may take Tylenol as directed or talk to your primary doctor about alternatives.   Increase activity slowly   Complete by:  As directed        Discharge Medications     Medication List    STOP taking these medications   diazepam 5 MG tablet Commonly known as:  Valium   VITAMIN E PO     TAKE these medications   albuterol 108 (90 Base) MCG/ACT inhaler Commonly known as:  PROVENTIL HFA;VENTOLIN HFA Inhale 2 puffs into the lungs every 6 (six) hours as needed for wheezing or shortness of breath (seasonal allergies).   aspirin EC 81 MG tablet Take 1 tablet (81 mg total) by mouth See admin instructions. What changed:    how much to take  additional instructions   atorvastatin 80 MG tablet Commonly known as:  LIPITOR Take 1 tablet (80 mg total) by mouth at bedtime. What changed:    medication strength  how much to take   carvedilol 3.125 MG tablet Commonly known as:  COREG Take 1 tablet (3.125 mg total) by mouth 2 (two) times daily with a meal.   cholecalciferol 25 MCG (1000 UT) tablet Commonly known as:  VITAMIN D3 Take 1,000 Units by mouth  daily at 6 PM.   Cyanocobalamin 2500 MCG Tabs Take 2,500 mcg by mouth daily at 6 PM. Vitamin B12   finasteride 5 MG tablet Commonly known as:  PROSCAR Take 1 tablet (5 mg total) by mouth daily.   Fish Oil 1000 MG Caps Take 1,000 mg by mouth daily at 6 PM.   gabapentin 800 MG tablet Commonly known as:  NEURONTIN Take 1 tablet (800 mg total) by mouth 4 (four) times daily. Take with 157m caps What changed:  additional instructions   gabapentin 100 MG capsule Commonly known as:  NEURONTIN TAKE 1 CAPSULE BY MOUTH 4 TIMES DAILY (TAKE WITH 800 MG TABLETS) What changed:    how much to take  how to take this  when to take this  additional instructions   imipramine 25 MG tablet Commonly known as:  TOFRANIL TAKE 6 TABLETS BY MOUTH ONCE DAILY AT BEDTIME What changed:    how much to take  how to take this  when to take this  additional instructions   nitroGLYCERIN 0.4 MG SL tablet Commonly known as:  NITROSTAT Place 1 tablet (0.4 mg total) under the tongue every 5 (five) minutes as needed for chest pain. DON'T take within 24 hrs of Viagra   pregabalin 100 MG capsule Commonly known as:  Lyrica Take 1 capsule (100 mg total) by mouth 2 (two) times daily. What changed:    when to take this  additional instructions   Senna S 8.6-50 MG tablet Generic drug:  senna-docusate Take 2 tablets by mouth at bedtime as needed (constipation).  sildenafil 50 MG tablet Commonly known as:  VIAGRA Take 50 mg by mouth daily as needed for erectile dysfunction.   ticagrelor 90 MG Tabs tablet Commonly known as:  BRILINTA Take 1 tablet (90 mg total) by mouth 2 (two) times daily.   zonisamide 100 MG capsule Commonly known as:  ZONEGRAN TAKE 3 CAPSULES BY MOUTH ONCE DAILY AT BEDTIME What changed:    how much to take  how to take this  when to take this  additional instructions        Acute coronary syndrome (MI, NSTEMI, STEMI, etc) this admission?: Yes.     AHA/ACC  Clinical Performance & Quality Measures: 1. Aspirin prescribed? - Yes 2. ADP Receptor Inhibitor (Plavix/Clopidogrel, Brilinta/Ticagrelor or Effient/Prasugrel) prescribed (includes medically managed patients)? - Yes 3. Beta Blocker prescribed? - Yes 4. High Intensity Statin (Lipitor 40-32m or Crestor 20-473m prescribed? - Yes 5. EF assessed during THIS hospitalization? - Yes 6. For EF <40%, was ACEI/ARB prescribed? - Not Applicable (EF >/= 4049%7. For EF <40%, Aldosterone Antagonist (Spironolactone or Eplerenone) prescribed? - Not Applicable (EF >/= 4082%8. Cardiac Rehab Phase II ordered (Included Medically managed Patients)? - Yes      Outstanding Labs/Studies   FLP/LFTs in 6 weeks.  Tele-health visit scheduled  Duration of Discharge Encounter   Greater than 30 minutes including physician time.  Signed, LiReino BellisP-C 11/17/2018, 10:07 AM   Agree with d/c summary -- final pt evaluation & Phys Exam performed by the undersigned.  DaGlenetta HewM.D., M.S. Interventional Cardiologist   Pager # [phone] # [phone] [address] [id number]

## 2018-11-17 NOTE — TOC Benefit Eligibility Note (Signed)
Transition of Care [facility]) Benefit Eligibility Note    Patient Details  Name: TIMARION AGCAOILI MRN: [id number] Date of Birth: 1950-09-08   Elenor Quinones, RN, BSN [phone] Pt to discharge home today.  TOC delivered pts discharge medications to bedside.  Pt informed CM that he has medicare and therefore pt will not be appropriate for copay card.  CM informed pt of below copay - pt denied hardship with paying.  No other CM needs determined - CM signing off   Medication/Dose: BRILINTA  90 MG BID( )  Covered?: Yes  Tier: 2 Drug  Prescription Coverage [facility]erred Pharmacy: YES([facility] )  Spoke with Person/Company/Phone Number:: [employer]([facility] [phone] )  Co-Pay: $ 93.79  Prior Approval: No  Deductible: Met       Maryclare Labrador, RN Phone Number: 11/17/2018, 11:05 AM

## 2018-11-18 ENCOUNTER — Encounter: Payer: Self-pay | Admitting: *Deleted

## 2018-11-18 ENCOUNTER — Other Ambulatory Visit: Payer: Self-pay | Admitting: *Deleted

## 2018-11-18 DIAGNOSIS — N138 Other obstructive and reflux uropathy: Secondary | ICD-10-CM | POA: Insufficient documentation

## 2018-11-18 DIAGNOSIS — E669 Obesity, unspecified: Secondary | ICD-10-CM | POA: Insufficient documentation

## 2018-11-18 DIAGNOSIS — N401 Enlarged prostate with lower urinary tract symptoms: Principal | ICD-10-CM

## 2018-11-18 NOTE — Patient Outreach (Addendum)
[facility]) Care Management  11/18/2018  DEMITRUS HARPIN 1951-03-18 [id number]  Transition of care telephone call  Referral received: 11/16/18 Initial outreach: 11/18/18 Insurance: [facility] Plan  Initial unsuccessful telephone call to patient's preferred (mobile) number in order to complete transition of care assessment; no answer, left HIPAA compliant voicemail message requesting return call.   Objective: Per the electronic medical record, Mr. Leone Checchi  was hospitalized at [facility] from  3/3-4/2 for non -ST elevated myocardial infarction. Comorbidities include: idiopathic peripheral neuropathy, BPH, chronic back pain, hyperlipidemia, obesity.  He was discharged to home on 11/17/18 without the need for home health services or durable medical equipment per the discharge summary.    Plan: If no return call from the patient by end of business day today, this RNCM will route unsuccessful outreach letter with [facility] Care Management pamphlet and 24 hour Nurse Advice Line Magnet to [facility] Care Management clinical pool to be mailed to patient's home address. This RNCM will attempt another outreach within 4 business days.   Bary Richard RN,CCM,CDE [facility] Care Management Coordinator Office Phone [phone] Office Fax [phone]

## 2018-11-23 ENCOUNTER — Other Ambulatory Visit: Payer: Self-pay | Admitting: *Deleted

## 2018-11-23 ENCOUNTER — Ambulatory Visit: Payer: Self-pay | Admitting: *Deleted

## 2018-11-23 ENCOUNTER — Encounter: Payer: Self-pay | Admitting: *Deleted

## 2018-11-23 ENCOUNTER — Ambulatory Visit: Payer: Medicare Other | Admitting: *Deleted

## 2018-11-23 NOTE — Patient Outreach (Addendum)
[facility]) Care Management  11/23/2018  Arthur Smith [birth year] [id number]   Transition of care telephone call   Referral received: 11/17/18 Initial outreach: 11/18/18 Insurance: [employer]  Subjective: Successful telephone call to patient's preferred number in order to complete transition of care assessment; 2 HIPAA identifiers verified. Explained purpose of call and completed transition of care assessment.  Mr. Arthur Smith says he is doing well, right radial insertion site is only mildly bruised, no swelling. He denies bowel or bladder issues. He denies chest pain or dyspnea since hospital discharge.  He refused offer to help him download the [employer] application to his mobile phone so that he can participate in his telehealth visit with cardiology on 11/25/18.   Objective:  Arthur Smith  was hospitalized at [facility] from  3/3-4/2 for non -ST elevated myocardial infarction. He had cardiac catherization on 3/31 and 4/1 with two drug eluting stents placed on 11/16/18. Comorbidities include: idiopathic peripheral neuropathy,BPH, chronic back pain, hyperlipidemia, obesity.  He was discharged to home on 11/17/18 without the need for home health services or durable medical equipment.   Assessment:  Patient voices good understanding of all discharge instructions,  CAD and CAD treatment plan.  See transition of care flowsheet for assessment details.  Plan: Reviewed CAD treatment plan, medication adherence, discussed common side effects of his new medications Coreg and Brilinta. Reviewed the Centers For Disease Control and Prevention (CDC) guidelines for Covid 19 to slow the spread. An unsuccessful outreach letter with a [facility] Care Management pamphlet and 24 hour Nurse Advice Line Magnet was mailed to the patient's home address on 11/18/18. No ongoing care management needs identified so will close case to [facility] Care  Management care management services.   Bary Richard RN,CCM,CDE [facility] Care Management Coordinator Office Phone [phone] Office Fax [phone]

## 2018-11-24 ENCOUNTER — Telehealth: Payer: Self-pay

## 2018-11-24 NOTE — Telephone Encounter (Signed)
Virtual Visit Pre-Appointment Phone Call  Steps For Call:  1. Confirm consent - "In the setting of the current Covid19 crisis, you are scheduled for a (phone or video) visit with your provider on (date) at (time).  Just as we do with many in-office visits, in order for you to participate in this visit, we must obtain consent.  If you'd like, I can send this to your mychart (if signed up) or email for you to review.  Otherwise, I can obtain your verbal consent now.  All virtual visits are billed to your insurance company just like a normal visit would be.  By agreeing to a virtual visit, we'd like you to understand that the technology does not allow for your provider to perform an examination, and thus may limit your provider's ability to fully assess your condition.  Finally, though the technology is pretty good, we cannot assure that it will always work on either your or our end, and in the setting of a video visit, we may have to convert it to a phone-only visit.  In either situation, we cannot ensure that we have a secure connection.  Are you willing to proceed?"  2. Give patient instructions for WebEx download to smartphone as below if video visit  3. Advise patient to be prepared with any vital sign or heart rhythm information, their current medicines, and a piece of paper and pen handy for any instructions they may receive the day of their visit  4. Inform patient they will receive a phone call 15 minutes prior to their appointment time (may be from unknown caller ID) so they should be prepared to answer  5. Confirm that appointment type is correct in Epic appointment notes (video vs telephone)    TELEPHONE CALL NOTE  Arthur Smith has been deemed a candidate for a follow-up tele-health visit to limit community exposure during the Covid-19 pandemic. I spoke with the patient via phone to ensure availability of phone/video source, confirm preferred email & phone number, and discuss  instructions and expectations.  I reminded QUENTION SCURA to be prepared with any vital sign and/or heart rhythm information that could potentially be obtained via home monitoring, at the time of his visit. I reminded TRAYTON YELEY to expect a phone call at the time of his visit if his visit.  Did the patient verbally acknowledge consent to treatment? YES  Dorris Fetch, CMA 11/24/2018 2:46 PM   DOWNLOADING THE WEBEX SOFTWARE TO SMARTPHONE  - If Apple, go to [employer] and type in WebEx in the search bar. Download [employer], the blue/green circle. The app is free but as with any other app downloads, their phone may require them to verify saved payment information or Apple password. The patient does NOT have to create an account.  - If Android, ask patient to go to [employer] and type in WebEx in the search bar. Download [employer], the blue/green circle. The app is free but as with any other app downloads, their phone may require them to verify saved payment information or Android password. The patient does NOT have to create an account.   CONSENT FOR TELE-HEALTH VISIT - PLEASE REVIEW  I hereby voluntarily request, consent and authorize [facility] and its employed or contracted physicians, physician assistants, nurse practitioners or other licensed health care professionals (the Practitioner), to provide me with telemedicine health care services (the "Services") as deemed necessary by the treating Practitioner. I  acknowledge and consent to receive the Services by the Practitioner via telemedicine. I understand that the telemedicine visit will involve communicating with the Practitioner through live audiovisual communication technology and the disclosure of certain medical information by electronic transmission. I acknowledge that I have been given the opportunity to request an in-person assessment or other available alternative prior to the telemedicine visit and  am voluntarily participating in the telemedicine visit.  I understand that I have the right to withhold or withdraw my consent to the use of telemedicine in the course of my care at any time, without affecting my right to future care or treatment, and that the Practitioner or I may terminate the telemedicine visit at any time. I understand that I have the right to inspect all information obtained and/or recorded in the course of the telemedicine visit and may receive copies of available information for a reasonable fee.  I understand that some of the potential risks of receiving the Services via telemedicine include:  Marland Kitchen Delay or interruption in medical evaluation due to technological equipment failure or disruption; . Information transmitted may not be sufficient (e.g. poor resolution of images) to allow for appropriate medical decision making by the Practitioner; and/or  . In rare instances, security protocols could fail, causing a breach of personal health information.  Furthermore, I acknowledge that it is my responsibility to provide information about my medical history, conditions and care that is complete and accurate to the best of my ability. I acknowledge that Practitioner's advice, recommendations, and/or decision may be based on factors not within their control, such as incomplete or inaccurate data provided by me or distortions of diagnostic images or specimens that may result from electronic transmissions. I understand that the practice of medicine is not an exact science and that Practitioner makes no warranties or guarantees regarding treatment outcomes. I acknowledge that I will receive a copy of this consent concurrently upon execution via email to the email address I last provided but may also request a printed copy by calling the office of [place].    I understand that my insurance will be billed for this visit.   I have read or had this consent read to me. . I understand the  contents of this consent, which adequately explains the benefits and risks of the Services being provided via telemedicine.  . I have been provided ample opportunity to ask questions regarding this consent and the Services and have had my questions answered to my satisfaction. . I give my informed consent for the services to be provided through the use of telemedicine in my medical care  By participating in this telemedicine visit I agree to the above.

## 2018-11-25 ENCOUNTER — Other Ambulatory Visit: Payer: Self-pay

## 2018-11-25 ENCOUNTER — Telehealth (INDEPENDENT_AMBULATORY_CARE_PROVIDER_SITE_OTHER): Payer: 59 | Admitting: Physician Assistant

## 2018-11-25 VITALS — BP 120/71 | HR 71 | Temp 96.6°F | Ht 69.0 in | Wt 221.0 lb

## 2018-11-25 DIAGNOSIS — I2510 Atherosclerotic heart disease of native coronary artery without angina pectoris: Secondary | ICD-10-CM

## 2018-11-25 DIAGNOSIS — E781 Pure hyperglyceridemia: Secondary | ICD-10-CM

## 2018-11-25 NOTE — Progress Notes (Signed)
Virtual Visit via Telephone Note   This visit type was conducted due to national recommendations for restrictions regarding the COVID-19 Pandemic (e.g. social distancing) in an effort to limit this patient's exposure and mitigate transmission in our community.  Due to his co-morbid illnesses, this patient is at least at moderate risk for complications without adequate follow up.  This format is felt to be most appropriate for this patient at this time.  The patient did not have access to video technology/had technical difficulties with video requiring transitioning to audio format only (telephone).  All issues noted in this document were discussed and addressed.  No physical exam could be performed with this format.  Please refer to the patient's chart for his  consent to telehealth for [name].   Evaluation Performed:  Follow-up visit  Date:  11/25/2018   ID:  Arthur Smith, DOB [birth year], MRN [id number]  Patient Location: Home  Provider Location: Home  PCP:  Lavone Orn, MD  Cardiologist:  Sanda Klein, MD  Electrophysiologist:  None   Chief Complaint:  Post hospital followup  History of Present Illness:    Arthur Smith is a 68 y.o. male who presents via audio/video conferencing for a telehealth visit today.    Mr. Arthur Smith is a 68 year old male with past medical history of hyperlipidemia and significant family history of premature CAD who recently was admitted to the hospital with NSTEMI.  Initial EKG was unremarkable but show sinus rhythm.  Troponin was elevated at 4.68.  Initial cardiac catheterization performed on 11/15/2018 showed a 90% RCA disease, 85% mid RCA disease, 90% RPDA lesion, 95% ostial left circumflex lesion treated with synergy DES, 100% occluded left circumflex treated with 2.25 x 32 mm Synergy DES, EF 55 to 65%.  RCA lesion was treated in a staged fashion on 11/16/2018 with 2 additional drug-eluting stents.  Cholesterol panel obtained during this admission  showed a total cholesterol 153, HDL 32, LDL 63, triglyceride 292.  Postprocedure, he was discharged on aspirin and Plavix.  Low-dose beta-blocker was also added.  Patient was contacted via telephone visit today.  Visit was ordered to be set up for a video conference visit through doximity, however due to technical issue, this was switched to telephone visit instead.  Since discharge, he denies any chest discomfort or shortness of breath.  He continues to experience a mild degree of dyspnea on exertion, I doubt this is related to side effect associated with Brilinta.  He has been compliant with aspirin and Brilinta.  He will continue to exercise at home.  I recommended he repeat fasting lipid panel and LFT in 6 to 8 weeks and follow-up with Dr. Sallyanne Kuster in 2 to 3 months.  The patient does not have symptoms concerning for COVID-19 infection (fever, chills, cough, or new shortness of breath).    Past Medical History:  Diagnosis Date   Arthritis    BPH without obstruction/lower urinary tract symptoms    Bronchitis with asthma, acute April 2013   CAD S/P percutaneous coronary angioplasty 11/15/2018   Severe 2 vessel obstructive CAD:. Ost-pCx 95% (DES PCI  STENT SYNERGY DES 3.5X28) &p-mCx 100% (DES PCI STENT SYNERGY DES 2.25X32). pRCA 90%, mRCA 85% & rPDA 90%.dLAD ~40%.  EF 55 to 60%.  Moderately elevated LVEDP.  No R WMA.  Plan staged PCI to 3 RCA-PDA lesions 4/1   Depression    denies   Frequency of urination    URINATION AT NIGHT, PAINFUL SLOW STREAM  GERD (gastroesophageal reflux disease)    occ   Hyperlipidemia    Neuromuscular disorder (HCC)    DIZZINESS   PERIPHERAL NEUROPATHY    Neuropathy    No pertinent past medical history    RINGING RIGHT EAR   NSTEMI (non-ST elevated myocardial infarction) ([place]) 11/13/2018   Recurrent upper respiratory infection (URI)    SOB CURRENT COLD   Shortness of breath    d/t "cold" 12/02/11   Past Surgical History:  Procedure Laterality  Date   CORONARY STENT INTERVENTION N/A 11/15/2018   Procedure: CORONARY STENT INTERVENTION;  Surgeon: Martinique, Peter M, MD;  Location: [facility] INVASIVE CV LAB;;  Ost-pCx 95% (DES PCI  STENT SYNERGY DES 3.5X28) &p-mCx 100% (DES PCI STENT SYNERGY DES 2.25X32)   CORONARY STENT INTERVENTION N/A 11/16/2018   Procedure: CORONARY STENT INTERVENTION;  Surgeon: Belva Crome, MD;  Location: [place] CV LAB;  Service: Cardiovascular;  Laterality: N/A;   FINGER SURGERY     RIGHT MIDDLE FINGER   HERNIA REPAIR     UMBILICAL HERNIA 8250   LEFT HEART CATH AND CORONARY ANGIOGRAPHY N/A 11/15/2018   Procedure: LEFT HEART CATH AND CORONARY ANGIOGRAPHY;  Surgeon: Martinique, Peter M, MD;  Location: [place] CV LAB;; Severe 2 vessel obstructive CAD:. Ost-pCx 95% (DES PCI) &p-mCx 100% (DES PCI). pRCA 90%, mRCA 85% & rPDA 90%.dLAD ~40%.  EF 55 to 60%.  Moderately elevated LVEDP.  No R WMA.  -- Plan staged PCI to 3 RCA-PDA lesions 4/1   LUMBAR LAMINECTOMY/DECOMPRESSION MICRODISCECTOMY  12/22/2011   Procedure: LUMBAR LAMINECTOMY/DECOMPRESSION MICRODISCECTOMY 1 LEVEL;  Surgeon: Erline Levine, MD;  Location: [place] NEURO ORS;  Service: Neurosurgery;  Laterality: N/A;  Thoracic ten-eleven laminectomy   LUMBAR LAMINECTOMY/DECOMPRESSION MICRODISCECTOMY Right 05/01/2014   Procedure: Right Lumbar three-four Microdiskectomy;  Surgeon: Erline Levine, MD;  Location: [place] NE[facility]O ORS;  Service: Neurosurgery;  Laterality: Right;   LUMBAR LAMINECTOMY/DECOMPRESSION MICRODISCECTOMY Right 11/08/2014   Procedure: Right L4-5 L5-S1 Laminectomy;  Surgeon: Erline Levine, MD;  Location: [place] NEURO ORS;  Service: Neurosurgery;  Laterality: Right;  Right L4-5 L5-S1 Laminectomy   MULTIPLE TOOTH EXTRACTIONS       Current Meds  Medication Sig   albuterol (PROVENTIL HFA;VENTOLIN HFA) 108 (90 Base) MCG/ACT inhaler Inhale 2 puffs into the lungs every 6 (six) hours as needed for wheezing or shortness of breath (seasonal allergies).   aspirin EC 81 MG tablet  Take 1 tablet (81 mg total) by mouth See admin instructions.   atorvastatin (LIPITOR) 80 MG tablet Take 1 tablet (80 mg total) by mouth at bedtime.   carvedilol (COREG) 3.125 MG tablet Take 1 tablet (3.125 mg total) by mouth 2 (two) times daily with a meal.   cholecalciferol (VITAMIN D3) 25 MCG (1000 UT) tablet Take 1,000 Units by mouth daily at 6 PM.   Cyanocobalamin 2500 MCG TABS Take 2,500 mcg by mouth daily at 6 PM. Vitamin B12   finasteride (PROSCAR) 5 MG tablet Take 1 tablet (5 mg total) by mouth daily.   gabapentin (NEURONTIN) 100 MG capsule TAKE 1 CAPSULE BY MOUTH 4 TIMES DAILY (TAKE WITH 800 MG TABLETS) (Patient taking differently: Take 100 mg by mouth every 6 (six) hours. Take with one 800 mg tablet for a total dose of 900 mg. - 6am, noon, 6pm, midnight)   gabapentin (NEURONTIN) 800 MG tablet Take 1 tablet (800 mg total) by mouth 4 (four) times daily. Take with 124m caps (Patient taking differently: Take 800 mg by mouth 4 (four) times daily. Take  with one 100 mg capsule for a total dose of 900 mg - 6am, noon, 6pm and midnight)   imipramine (TOFRANIL) 25 MG tablet TAKE 6 TABLETS BY MOUTH ONCE DAILY AT BEDTIME (Patient taking differently: Take 150 mg by mouth daily at 6 PM. )   nitroGLYCERIN (NITROSTAT) 0.4 MG SL tablet Place 1 tablet (0.4 mg total) under the tongue every 5 (five) minutes as needed for chest pain. DON'T take within 24 hrs of Viagra   Omega-3 Fatty Acids (FISH OIL) 1000 MG CAPS Take 1,000 mg by mouth daily at 6 PM.   pregabalin (LYRICA) 100 MG capsule Take 1 capsule (100 mg total) by mouth 2 (two) times daily. (Patient taking differently: Take 100 mg by mouth every 12 (twelve) hours. Noon and midnight)   senna-docusate (SENNA S) 8.6-50 MG per tablet Take 2 tablets by mouth at bedtime as needed (constipation).    sildenafil (VIAGRA) 50 MG tablet Take 50 mg by mouth daily as needed for erectile dysfunction.   ticagrelor (BRILINTA) 90 MG TABS tablet Take 1 tablet (90  mg total) by mouth 2 (two) times daily.   zonisamide (ZONEGRAN) 100 MG capsule TAKE 3 CAPSULES BY MOUTH ONCE DAILY AT BEDTIME (Patient taking differently: Take 300 mg by mouth daily at 6 PM. )     Allergies:   Eggs or egg-derived products and Lactose intolerance (gi)   Social History   Tobacco Use   Smoking status: Former Smoker    Packs/day: 2.00    Years: 15.00    Pack years: 30.00    Types: Cigarettes    Last attempt to quit: 04/24/1984    Years since quitting: 34.6   Smokeless tobacco: Never Used  Substance Use Topics   Alcohol use: Yes    Comment: occ   Drug use: No     Family Hx: The patient's family history includes Cancer in his mother; Neuropathy in his brother; Stroke in his father.  ROS:   Please see the history of present illness.     All other systems reviewed and are negative.   Prior CV studies:   The following studies were reviewed today:  Cath 11/15/2018  Prox RCA lesion is 90% stenosed.  Mid RCA lesion is 85% stenosed.  RPDA lesion is 90% stenosed.  Dist LAD lesion is 40% stenosed.  Ost Cx to Prox Cx lesion is 95% stenosed.  A drug-eluting stent was successfully placed using a STENT SYNERGY DES 3.5X28.  Prox Cx to Mid Cx lesion is 100% stenosed.  Post intervention, there is a 0% residual stenosis.  A drug-eluting stent was successfully placed using a STENT SYNERGY DES 2.25X32.  Post intervention, there is a 0% residual stenosis.  The left ventricular systolic function is normal.  LV end diastolic pressure is moderately elevated.  The left ventricular ejection fraction is 55-65% by visual estimate.   1. Severe 2 vessel obstructive CAD.    - 95% proximal LCx. 100% distal LCx- this is the culprit lesion    - 90% proximal RCA, 85% segmental mid RCA, 90% PDA 2. Normal LV function 3. Moderately elevated LVEDP 4. Successful PCI of the proximal and distal LCX with DES x 2.   Plan: DAPT for one year. Hydrate and observe renal function.  Planned for stage PCI of the RCA and PDA in next 48 hours.    Cath 11/16/2018  A stent was successfully placed.  A stent was successfully placed.    Successful RCA PCI resulting in proximal 90%  stenosis, mid to distal 85% stenosis and PDA 90% stenosis being reduced to 0%, 0%, and 0% using Onyx drug-eluting stents.  TIMI grade III flow was noted.  The RCA proper was treated with 3.5 mm Onyx stents postdilated to 3.75 mm.  The PDA was treated with 2.0 Onyx stent deployed at 14 atm.  RECOMMENDATIONS:   Aspirin and Brilinta for at least 12 months.  Consider dropping aspirin after 3 months if any issue with bleeding.  Aggressive risk factor modification.  Anticipate discharge in a.m.  Labs/Other Tests and Data Reviewed:    EKG:  An ECG dated 11/17/2018 was personally reviewed today and demonstrated:  Normal sinus rhythm  Recent Labs: 11/14/2018: ALT 48 11/17/2018: BUN 14; Creatinine, Ser 1.06; Hemoglobin 12.9; Platelets 213; Potassium 3.6; Sodium 137   Recent Lipid Panel Lab Results  Component Value Date/Time   CHOL 153 11/15/2018 02:40 AM   TRIG 292 (H) 11/15/2018 02:40 AM   HDL 32 (L) 11/15/2018 02:40 AM   CHOLHDL 4.8 11/15/2018 02:40 AM   LDLCALC 63 11/15/2018 02:40 AM    Wt Readings from Last 3 Encounters:  11/25/18 221 lb (100.2 kg)  11/16/18 219 lb 4.8 oz (99.5 kg)  09/02/18 224 lb 9.6 oz (101.9 kg)     Objective:    Vital Signs:  BP 120/71    Pulse 71    Temp (!) 96.6 F (35.9 C)    Ht '5\' 9"'  (1.753 m)    Wt 221 lb (100.2 kg)    BMI 32.64 kg/m    Well nourished, well developed male in no acute distress.   ASSESSMENT & PLAN:    1. CAD: Drug-eluting stents to left circumflex and RCA.  Continue aspirin and Brilinta.  Increase activity as tolerated  2. Hyperlipidemia: He has both low HDL and high triglyceride.  He is currently on over-the-counter fish oil and high-dose Lipitor.  Repeat fasting lipid panel and LFT in 6 to 8 weeks.  If triglyceride is still not under  control, will consider addition of Lovaza.  COVID-19 Education: The signs and symptoms of COVID-19 were discussed with the patient and how to seek care for testing (follow up with PCP or arrange E-visit).  The importance of social distancing was discussed today.  Time:   Today, I have spent 17 minutes with the patient with telehealth technology discussing the above problems.     Medication Adjustments/Labs and Tests Ordered: Current medicines are reviewed at length with the patient today.  Concerns regarding medicines are outlined above.  Tests Ordered: No orders of the defined types were placed in this encounter.  Medication Changes: No orders of the defined types were placed in this encounter.   Disposition:  Follow up in 2 month(s)  Signed, Almyra Deforest, PA  11/25/2018 9:19 AM    [place]

## 2018-11-25 NOTE — Patient Instructions (Signed)
Medication Instructions:   Your physician recommends that you continue on your current medications as directed. Please refer to the Current Medication list given to you today.  If you need a refill on your cardiac medications before your next appointment, please call your pharmacy.   Lab work:  You will need to come into our office in 2 months for labs (blood work): LIPID LIVER  If you have labs (blood work) drawn today and your tests are completely normal, you will receive your results only by: Marland Kitchen MyChart Message (if you have MyChart) OR . A paper copy in the mail If you have any lab test that is abnormal or we need to change your treatment, we will call you to review the results.  Testing/Procedures: NONE ORDERED AT THIS TIME OF THE APPOINTMENT   Follow-Up: At [facility], you and your health needs are our priority.  As part of our continuing mission to provide you with exceptional heart care, we have created designated Provider Care Teams.  These Care Teams include your primary Cardiologist (physician) and Advanced Practice Providers (APPs -  Physician Assistants and Nurse Practitioners) who all work together to provide you with the care you need, when you need it. You will need a follow up appointment in 2-3 months.  Please call our office 2 months in advance to schedule this appointment.  You may see Thurmon Fair, MD or one of the following Advanced Practice Providers on your designated Care Team: [place], [place] . Micah Flesher, PA-C  Any Other Special Instructions Will Be Listed Below (If Applicable).

## 2018-11-28 ENCOUNTER — Telehealth (HOSPITAL_COMMUNITY): Payer: Self-pay | Admitting: *Deleted

## 2018-11-28 NOTE — Telephone Encounter (Signed)
Called and spoke to pt to advise him that we are currently closed to patients due to Covid-19.  Pt verbalized understanding.  Answered general questions he had in regards to exercise.  [place] mostly walking in his driveway. Advised him to continue to increase his time daily as he is able to tolerate. Pt has no complaints of cp. Pt aware that we will contact him for scheduling and insurance verification.  Clarified that pt Medicare is primary, wife insurance is secondary and Insurance account manager is tertiary when permitted. Thanked me for the call. Alanson Aly, BSN Cardiac and Emergency planning/management officer

## 2018-12-02 MED FILL — NITROGLYCERIN 0.4 MG TAB SL: 0.4 | 8 days supply | Qty: 25 | Fill #0 | Status: TO

## 2018-12-02 MED FILL — ZONISAMIDE 100 MG CAPSULE: 100 | 30 days supply | Qty: 90 | Fill #0

## 2018-12-15 MED FILL — IMIPRAMINE HCL 25 MG TABLET: 25 | 30 days supply | Qty: 180 | Fill #1 | Status: TO

## 2018-12-15 MED FILL — PREGABALIN 100 MG CAPS: 100 | 30 days supply | Qty: 60 | Fill #1 | Status: TO

## 2018-12-15 MED FILL — CARVEDILOL 3.125 MG TABLET: 3.125 | 30 days supply | Qty: 60 | Fill #0 | Status: TO

## 2018-12-15 MED FILL — GABAPENTIN 800 MG TABS: 800 | 30 days supply | Qty: 120 | Fill #1 | Status: TO

## 2018-12-15 MED FILL — ATORVASTATIN 80 MG TABLET: 80 | 30 days supply | Qty: 30 | Fill #1 | Status: TO

## 2018-12-15 MED FILL — GABAPENTIN 100 MG CAPSULE: 100 | 30 days supply | Qty: 120 | Fill #1 | Status: TO

## 2018-12-15 MED FILL — BRILINTA 90 MG TABLET: 90 | 30 days supply | Qty: 60 | Fill #1 | Status: TO

## 2018-12-30 ENCOUNTER — Telehealth (HOSPITAL_COMMUNITY): Payer: Self-pay | Admitting: *Deleted

## 2018-12-30 NOTE — Telephone Encounter (Signed)
[id number] Follow-up call made to patient regarding Cardiac Rehab referral. Informed patient that program remains closed at this time due to COVID-19 restrictions. Pt is walking 15-20 minutes BID, 2-4 days/week depending on temperature outside. Pt is also doing push-ups, sit-up, and weight lifting up to 10 lbs. Pt has neuropathy in legs, so unable to walk comfortably up hills, but other wise is doing well with walking. Will mail patient exercise packet with warm-up/cool-down stretches and light hand weight exercises. Pt states he's made changes to his diet to be more heart healthy including eating no red meat. Pt doesn't have any concerns about his exercise at this time. Will continue to follow.  Artist Pais, MS, ACSM CEP

## 2019-01-02 MED FILL — ZONISAMIDE 100 MG CAPSULE: 100 | 30 days supply | Qty: 90 | Fill #1

## 2019-01-12 MED FILL — BRILINTA 90 MG TABLET: 90 | 30 days supply | Qty: 60 | Fill #0

## 2019-01-12 MED FILL — ATORVASTATIN 80 MG TABLET: 80 | 30 days supply | Qty: 30 | Fill #0

## 2019-01-12 MED FILL — PREGABALIN 100 MG CAPS: 100 | 30 days supply | Qty: 60 | Fill #0

## 2019-01-12 MED FILL — IMIPRAMINE HCL 25 MG TABLET: 25 | 30 days supply | Qty: 180 | Fill #0

## 2019-01-12 MED FILL — CARVEDILOL 3.125 MG TABLET: 3.125 | 30 days supply | Qty: 60 | Fill #0

## 2019-01-12 MED FILL — GABAPENTIN 800 MG TABLET: 800 | 30 days supply | Qty: 120 | Fill #0

## 2019-01-12 MED FILL — GABAPENTIN 100 MG CAPSULE: 100 | 30 days supply | Qty: 120 | Fill #0

## 2019-01-12 MED FILL — NITROGLYCERIN 0.4 MG TAB SL: 0.4 | 8 days supply | Qty: 25 | Fill #0

## 2019-02-01 ENCOUNTER — Encounter (HOSPITAL_COMMUNITY): Payer: Self-pay

## 2019-02-01 ENCOUNTER — Telehealth (HOSPITAL_COMMUNITY): Payer: Self-pay

## 2019-02-01 NOTE — Telephone Encounter (Signed)
No response from pt, message left on voicemail regarding Virtual Cardiac Rehab for the continuation of her participation.   ° °Please contact letter sent to address on file in epic. Await response, if no response by 02/15/2019 will discharge pt from the program.  °

## 2019-02-08 ENCOUNTER — Telehealth: Payer: Self-pay | Admitting: Cardiovascular Disease

## 2019-02-08 MED ORDER — ATORVASTATIN CALCIUM 80 MG PO TABS: 80.0000 mg | ORAL_TABLET | Freq: Every day | ORAL | 3 refills | Status: AC

## 2019-02-08 MED ORDER — TICAGRELOR 90 MG PO TABS: 90.0000 mg | ORAL_TABLET | Freq: Two times a day (BID) | ORAL | 3 refills | Status: DC

## 2019-02-08 MED FILL — GABAPENTIN 100 MG CAPSULE: 100 | 30 days supply | Qty: 120 | Fill #1

## 2019-02-08 MED FILL — PREGABALIN 100 MG CAPS: 100 | 30 days supply | Qty: 60 | Fill #1

## 2019-02-08 MED FILL — ZONISAMIDE 100 MG CAPSULE: 100 | 30 days supply | Qty: 90 | Fill #0

## 2019-02-08 MED FILL — GABAPENTIN 800 MG TABLET: 800 | 30 days supply | Qty: 120 | Fill #1

## 2019-02-08 MED FILL — IMIPRAMINE HCL 25 MG TABLET: 25 | 30 days supply | Qty: 180 | Fill #1

## 2019-02-08 NOTE — Telephone Encounter (Signed)
New message    *STAT* If patient is at the pharmacy, call can be transferred to refill team.   1. Which medications need to be refilled? (please list name of each medication and dose if known)?  ticagrelor (BRILINTA) 90 MG TABS tablet      atorvastatin (LIPITOR) 80 MG tablet    2. Which pharmacy/location (including street and city if local pharmacy) is medication to be sent to? [facility] on [address] [place]  3. Do they need a 30 day or 90 day supply? [place]

## 2019-02-08 NOTE — Telephone Encounter (Signed)
New Rx sent to pharmacy

## 2019-02-09 ENCOUNTER — Other Ambulatory Visit: Payer: Self-pay | Admitting: Cardiovascular Disease

## 2019-02-09 ENCOUNTER — Telehealth (HOSPITAL_COMMUNITY): Payer: Self-pay | Admitting: *Deleted

## 2019-02-09 MED ORDER — CARVEDILOL 3.125 MG PO TABS: 3.1250 mg | ORAL_TABLET | Freq: Two times a day (BID) | ORAL | 2 refills | Status: DC

## 2019-02-09 NOTE — Telephone Encounter (Signed)
Pt calling for refill on carvedilol [facility] to [facility] on [address]. 90 day supply. Please address

## 2019-03-13 MED FILL — GABAPENTIN 800 MG TABLET: 800 | 30 days supply | Qty: 120 | Fill #2

## 2019-03-13 MED FILL — GABAPENTIN 100 MG CAPSULE: 100 | 30 days supply | Qty: 120 | Fill #2

## 2019-03-13 MED FILL — IMIPRAMINE HCL 25 MG TABLET: 25 | 30 days supply | Qty: 180 | Fill #2

## 2019-03-14 ENCOUNTER — Other Ambulatory Visit: Payer: Self-pay | Admitting: Neurology

## 2019-03-14 ENCOUNTER — Telehealth: Payer: Self-pay | Admitting: Neurology

## 2019-03-14 MED ORDER — PREGABALIN 100 MG PO CAPS: 100.0000 mg | ORAL_CAPSULE | Freq: Two times a day (BID) | ORAL | 1 refills | Status: DC

## 2019-03-14 MED FILL — PREGABALIN 100 MG CAPS: 100 | 30 days supply | Qty: 60 | Fill #0

## 2019-03-14 NOTE — Telephone Encounter (Signed)
[place] drug registry verified. Last refill was 02/08/2019 # 60 for a 30 day supply provided by our office.

## 2019-03-14 NOTE — Telephone Encounter (Signed)
Pt called needing a refill on his pregabalin (LYRICA) 100 MG capsule sent to the [facility] on [facility]

## 2019-04-13 MED FILL — IMIPRAMINE HCL 25 MG TABLET: 25 | 30 days supply | Qty: 180 | Fill #3

## 2019-04-13 MED FILL — PREGABALIN 100 MG CAPS: 100 | 30 days supply | Qty: 60 | Fill #0

## 2019-04-13 MED FILL — GABAPENTIN 800 MG TABLET: 800 | 30 days supply | Qty: 120 | Fill #3

## 2019-04-13 MED FILL — GABAPENTIN 100 MG CAPSULE: 100 | 30 days supply | Qty: 120 | Fill #3

## 2019-05-01 ENCOUNTER — Other Ambulatory Visit: Payer: Self-pay | Admitting: Cardiovascular Disease

## 2019-05-10 ENCOUNTER — Ambulatory Visit (INDEPENDENT_AMBULATORY_CARE_PROVIDER_SITE_OTHER): Payer: 59 | Admitting: Cardiovascular Disease

## 2019-05-10 ENCOUNTER — Encounter: Payer: Self-pay | Admitting: Cardiovascular Disease

## 2019-05-10 ENCOUNTER — Other Ambulatory Visit: Payer: Self-pay

## 2019-05-10 VITALS — BP 119/79 | HR 70 | Temp 97.0°F | Ht 69.5 in | Wt 212.6 lb

## 2019-05-10 DIAGNOSIS — I2510 Atherosclerotic heart disease of native coronary artery without angina pectoris: Secondary | ICD-10-CM

## 2019-05-10 DIAGNOSIS — E781 Pure hyperglyceridemia: Secondary | ICD-10-CM | POA: Diagnosis not present

## 2019-05-10 DIAGNOSIS — E782 Mixed hyperlipidemia: Secondary | ICD-10-CM

## 2019-05-10 LAB — HEPATIC FUNCTION PANEL
ALT: 52 IU/L — ABNORMAL HIGH (ref 0–44)
AST: 34 IU/L (ref 0–40)
Albumin: 4.3 g/dL (ref 3.8–4.8)
Alkaline Phosphatase: 142 IU/L — ABNORMAL HIGH (ref 39–117)
Bilirubin Total: 0.8 mg/dL (ref 0.0–1.2)
Bilirubin, Direct: 0.28 mg/dL (ref 0.00–0.40)
Total Protein: 6.5 g/dL (ref 6.0–8.5)

## 2019-05-10 LAB — LIPID PANEL
Chol/HDL Ratio: 3.8 ratio (ref 0.0–5.0)
Cholesterol, Total: 136 mg/dL (ref 100–199)
HDL: 36 mg/dL — ABNORMAL LOW (ref >39)
LDL Chol Calc (NIH): 67 mg/dL (ref 0–99)
Triglycerides: 195 mg/dL — ABNORMAL HIGH (ref 0–149)
VLDL Cholesterol Cal: 33 mg/dL (ref 5–40)

## 2019-05-10 NOTE — Progress Notes (Signed)
Cardiology Office Note:    Date:  05/10/2019   ID:  Arthur Smith, DOB [birth year], MRN [id number]  PCP:  Lavone Orn, MD  Cardiologist:  Sanda Klein, MD  Electrophysiologist:  None   Referring MD: Lavone Orn, MD   Chief Complaint  Patient presents with   Coronary Artery Disease    History of Present Illness:     Arthur Smith is a 68 y.o. male with a hx of CAD, with an initial presentation with non-ST segment elevation myocardial infarction in March 2020.  At that time he was found to have high-grade stenoses in both the left circumflex and the right coronary artery and underwent staged PCI with a total of 4 drug-eluting stents (SYNERGY DES 3.5X28 Prox Cx,  STENT SYNERGY DES 2.25X32 distal circumflex, culprit lesion; ONYX 3.5 mm stent in RCA, 2.0 mm stent in PDA).  Since his initial presentation he has not had any problems with angina (he describes this as a precordial deep discomfort that radiates to his throat and lower jaw).  Activity is limited by severe neuropathy in his feet.  He has a hard time using a bicycle due to low back pain.  His forward form of exercise is swimming, which has been limited due to the coronavirus restrictions.  The patient specifically denies any chest pain at rest exertion, dyspnea at rest or with exertion, orthopnea, paroxysmal nocturnal dyspnea, syncope, palpitations, focal neurological deficits, intermittent claudication, lower extremity edema, unexplained weight gain, cough, hemoptysis or wheezing.  He denies any falls or bleeding problems.    Past Medical History:  Diagnosis Date   Arthritis    BPH without obstruction/lower urinary tract symptoms    Bronchitis with asthma, acute April 2013   CAD S/P percutaneous coronary angioplasty 11/15/2018   Severe 2 vessel obstructive CAD:. Ost-pCx 95% (DES PCI  STENT SYNERGY DES 3.5X28) &p-mCx 100% (DES PCI STENT SYNERGY DES 2.25X32). pRCA 90%, mRCA 85% & rPDA 90%.dLAD ~40%.  EF 55 to 60%.   Moderately elevated LVEDP.  No R WMA.  Plan staged PCI to 3 RCA-PDA lesions 4/1   Depression    denies   Frequency of urination    URINATION AT NIGHT, PAINFUL SLOW STREAM    GERD (gastroesophageal reflux disease)    occ   Hyperlipidemia    Neuromuscular disorder (HCC)    DIZZINESS   PERIPHERAL NEUROPATHY    Neuropathy    No pertinent past medical history    RINGING RIGHT EAR   NSTEMI (non-ST elevated myocardial infarction) ([place]) 11/13/2018   Recurrent upper respiratory infection (URI)    SOB CURRENT COLD   Shortness of breath    d/t "cold" 12/02/11    Past Surgical History:  Procedure Laterality Date   CORONARY STENT INTERVENTION N/A 11/15/2018   Procedure: CORONARY STENT INTERVENTION;  Surgeon: Martinique, Peter M, MD;  Location: [facility] INVASIVE CV LAB;;  Ost-pCx 95% (DES PCI  STENT SYNERGY DES 3.5X28) &p-mCx 100% (DES PCI STENT SYNERGY DES 2.25X32)   CORONARY STENT INTERVENTION N/A 11/16/2018   Procedure: CORONARY STENT INTERVENTION;  Surgeon: Belva Crome, MD;  Location: [facility];  Service: Cardiovascular;  Laterality: N/A;   FINGER SURGERY     RIGHT MIDDLE FINGER   HERNIA REPAIR     UMBILICAL HERNIA 1194   LEFT HEART CATH AND CORONARY ANGIOGRAPHY N/A 11/15/2018   Procedure: LEFT HEART CATH AND CORONARY ANGIOGRAPHY;  Surgeon: Martinique, Peter M, MD;  Location: [facility] INVASIVE CV LAB;; Severe 2 vessel obstructive  CAD:. Ost-pCx 95% (DES PCI) &p-mCx 100% (DES PCI). pRCA 90%, mRCA 85% & rPDA 90%.dLAD ~40%.  EF 55 to 60%.  Moderately elevated LVEDP.  No R WMA.  -- Plan staged PCI to 3 RCA-PDA lesions 4/1   LUMBAR LAMINECTOMY/DECOMPRESSION MICRODISCECTOMY  12/22/2011   Procedure: LUMBAR LAMINECTOMY/DECOMPRESSION MICRODISCECTOMY 1 LEVEL;  Surgeon: Erline Levine, MD;  Location: [facility];  Service: Neurosurgery;  Laterality: N/A;  Thoracic ten-eleven laminectomy   LUMBAR LAMINECTOMY/DECOMPRESSION MICRODISCECTOMY Right 05/01/2014   Procedure: Right Lumbar three-four  Microdiskectomy;  Surgeon: Erline Levine, MD;  Location: [place] [facility]UR[facility] OR[facility];  Service: Neurosurgery;  Laterality: Right;   LUMBAR LAMINECTOMY/DECOMPRESSION MICRODISCECTOMY Right 11/08/2014   Procedure: Right L4-5 L5-S1 Laminectomy;  Surgeon: Erline Levine, MD;  Location: [place] [facility];  Service: Neurosurgery;  Laterality: Right;  Right L4-5 L5-S1 Laminectomy   MULTIPLE TOOTH EXTRACTIONS      Current Medications: Current Meds  Medication Sig   albuterol (PROVENTIL HFA;VENTOLIN HFA) 108 (90 Base) MCG/ACT inhaler Inhale 2 puffs into the lungs every 6 (six) hours as needed for wheezing or shortness of breath (seasonal allergies).   aspirin EC 81 MG tablet Take 1 tablet (81 mg total) by mouth See admin instructions.   atorvastatin (LIPITOR) 80 MG tablet Take 1 tablet (80 mg total) by mouth at bedtime.   carvedilol (COREG) 3.125 MG tablet TAKE 1 TABLET BY MOUTH TWICE A DAY WITH A MEAL   cholecalciferol (VITAMIN D3) 25 MCG (1000 UT) tablet Take 1,000 Units by mouth daily at 6 PM.   Cyanocobalamin 2500 MCG TABS Take 2,500 mcg by mouth daily at 6 PM. Vitamin B12   finasteride (PROSCAR) 5 MG tablet Take 1 tablet (5 mg total) by mouth daily.   gabapentin (NEURONTIN) 100 MG capsule TAKE 1 CAPSULE BY MOUTH 4 TIMES DAILY (TAKE WITH 800 MG TABLETS) (Patient taking differently: Take 100 mg by mouth every 6 (six) hours. Take with one 800 mg tablet for a total dose of 900 mg. - 6am, noon, 6pm, midnight)   gabapentin (NEURONTIN) 800 MG tablet Take 1 tablet (800 mg total) by mouth 4 (four) times daily. Take with 159m caps (Patient taking differently: Take 800 mg by mouth 4 (four) times daily. Take with one 100 mg capsule for a total dose of 900 mg - 6am, noon, 6pm and midnight)   imipramine (TOFRANIL) 25 MG tablet TAKE 6 TABLETS BY MOUTH ONCE DAILY AT BEDTIME (Patient taking differently: Take 150 mg by mouth daily at 6 PM. )   nitroGLYCERIN (NITROSTAT) 0.4 MG SL tablet Place 1 tablet (0.4 mg total) under the  tongue every 5 (five) minutes as needed for chest pain. DON'T take within 24 hrs of Viagra   Omega-3 Fatty Acids (FISH OIL) 1000 MG CAPS Take 1,000 mg by mouth daily at 6 PM.   pregabalin (LYRICA) 100 MG capsule Take 1 capsule (100 mg total) by mouth 2 (two) times daily.   senna-docusate (SENNA S) 8.6-50 MG per tablet Take 2 tablets by mouth at bedtime as needed (constipation).    sildenafil (VIAGRA) 50 MG tablet Take 50 mg by mouth daily as needed for erectile dysfunction.   ticagrelor (BRILINTA) 90 MG TABS tablet Take 1 tablet (90 mg total) by mouth 2 (two) times daily.   zonisamide (ZONEGRAN) 100 MG capsule TAKE 3 CAPSULES BY MOUTH ONCE DAILY AT BEDTIME (Patient taking differently: Take 300 mg by mouth daily at 6 PM. )     Allergies:   Eggs or egg-derived products and Lactose intolerance (  gi)   Social History   Socioeconomic History   Marital status: Married    Spouse name: [name]   Number of children: 3   Years of education: 12   Highest education level: Not on file  Occupational History   Occupation: Retired  Scientist, product/process development strain: Not on file   Food insecurity    Worry: Not on file    Inability: Not on Lexicographer needs    Medical: Not on file    Non-medical: Not on file  Tobacco Use   Smoking status: Former Smoker    Packs/day: 2.00    Years: 15.00    Pack years: 30.00    Types: Cigarettes    Quit date: 04/24/1984    Years since quitting: 35.0   Smokeless tobacco: Never Used  Substance and Sexual Activity   Alcohol use: Yes    Comment: occ   Drug use: No   Sexual activity: Not on file  Lifestyle   Physical activity    Days per week: Not on file    Minutes per session: Not on file   Stress: Not on file  Relationships   Social connections    Talks on phone: Not on file    Gets together: Not on file    Attends religious service: Not on file    Active member of club or organization: Not on file    Attends  meetings of clubs or organizations: Not on file    Relationship status: Not on file  Other Topics Concern   Not on file  Social History Narrative   Patient lives at home Eleva his wife.    Patient has 3 adult children and 1 step son.    Patient has a high school education.    Patient is retired.    [place] [employer]      Family History: The patient's family history includes Cancer in his mother; Neuropathy in his brother; Stroke in his father.  ROS:   Please see the history of present illness.     All other systems reviewed and are negative.  EKGs/Labs/Other Studies Reviewed:    The following studies were reviewed today: CATH 11/15/2018  Prox RCA lesion is 90% stenosed.  Mid RCA lesion is 85% stenosed.  RPDA lesion is 90% stenosed.  Dist LAD lesion is 40% stenosed.  Ost Cx to Prox Cx lesion is 95% stenosed.  A drug-eluting stent was successfully placed using a STENT SYNERGY DES 3.5X28.  Prox Cx to Mid Cx lesion is 100% stenosed.  Post intervention, there is a 0% residual stenosis.  A drug-eluting stent was successfully placed using a STENT SYNERGY DES 2.25X32.  Post intervention, there is a 0% residual stenosis.  The left ventricular systolic function is normal.  LV end diastolic pressure is moderately elevated.  The left ventricular ejection fraction is 55-65% by visual estimate.   1. Severe 2 vessel obstructive CAD.    - 95% proximal LCx. 100% distal LCx- this is the culprit lesion    - 90% proximal RCA, 85% segmental mid RCA, 90% PDA 2. Normal LV function 3. Moderately elevated LVEDP 4. Successful PCI of the proximal and distal LCX with DES x 2.   Plan: DAPT for one year. Hydrate and observe renal function. Planned for stage PCI of the RCA and PDA in next 48 hours.   CATH 11/16/2018   A stent was successfully placed.  A stent was successfully placed.  Successful RCA PCI resulting in proximal 90% stenosis, mid to distal 85% stenosis and PDA 90% stenosis  being reduced to 0%, 0%, and 0% using Onyx drug-eluting stents.  TIMI grade III flow was noted.  The RCA proper was treated with 3.5 mm Onyx stents postdilated to 3.75 mm.  The PDA was treated with 2.0 Onyx stent deployed at 14 atm.  RECOMMENDATIONS:   Aspirin and Brilinta for at least 12 months.  Consider dropping aspirin after 3 months if any issue with bleeding.  Aggressive risk factor modification.  Anticipate discharge in a.m.    EKG:  EKG is not ordered today.   Recent Labs: 11/14/2018: ALT 48 11/17/2018: BUN 14; Creatinine, Ser 1.06; Hemoglobin 12.9; Platelets 213; Potassium 3.6; Sodium 137  Recent Lipid Panel    Component Value Date/Time   CHOL 153 11/15/2018 0240   TRIG 292 (H) 11/15/2018 0240   HDL 32 (L) 11/15/2018 0240   CHOLHDL 4.8 11/15/2018 0240   VLDL 58 (H) 11/15/2018 0240   LDLCALC 63 11/15/2018 0240    Physical Exam:    VS:  BP 119/79    Pulse 70    Temp (!) 97 F (36.1 C) (Temporal)    Ht 5' 9.5" (1.765 m)    Wt 212 lb 9.6 oz (96.4 kg)    SpO2 100%    BMI 30.95 kg/m     Wt Readings from Last 3 Encounters:  05/10/19 212 lb 9.6 oz (96.4 kg)  11/25/18 221 lb (100.2 kg)  11/16/18 219 lb 4.8 oz (99.5 kg)     GEN:  Well nourished, well developed in no acute distress HEENT: Normal NECK: No JVD; No carotid bruits LYMPHATICS: No lymphadenopathy CARDIAC: RRR, no murmurs, rubs, gallops RESPIRATORY:  Clear to auscultation without rales, wheezing or rhonchi  ABDOMEN: Soft, non-tender, non-distended MUSCULOSKELETAL:  No edema; No deformity  SKIN: Warm and dry NEUROLOGIC:  Alert and oriented x 3 PSYCHIATRIC:  Normal affect   ASSESSMENT:    1. Coronary artery disease involving native coronary artery of native heart without angina pectoris   2. Mixed hyperlipidemia    PLAN:    In order of problems listed above:  1. CAD: Asymptomatic, roughly 6 months following placement of 4 separate stents in the left circumflex or right coronary artery.  Due to the  sheer length of stent placed he is at high risk for restenosis and we discussed how this will present in what he should do if he comes symptomatic.  Plan to continue Brilinta through November 16, 2019 after which she will remain on lifelong aspirin. 2. HLP: Target LDL cholesterol less than 70, preferably triglycerides less than 150 and HDL greater than 40.  Recheck labs today.  While his most recent LDL was within target at the current statin dose, he still has a very low HDL.  This will only improve with physical activity.  I encouraged him to try to start swimming again and will send a letter to his insurance company to see if they can help support him financially with this.   Medication Adjustments/Labs and Tests Ordered: Current medicines are reviewed at length with the patient today.  Concerns regarding medicines are outlined above.  No orders of the defined types were placed in this encounter.  No orders of the defined types were placed in this encounter.   Patient Instructions  Medication Instructions:  Your physician recommends that you continue on your current medications as directed. Please refer to the Current Medication list given  to you today.  If you need a refill on your cardiac medications before your next appointment, please call your pharmacy.   Lab work: None ordered If you have labs (blood work) drawn today and your tests are completely normal, you will receive your results only by: [place] (if you have MyChart) OR A paper copy in the mail If you have any lab test that is abnormal or we need to change your treatment, we will call you to review the results.  Testing/Procedures: None ordered  Follow-Up: Follow up with Almyra Deforest, PA in April 2021        Signed, Sanda Klein, MD  05/10/2019 9:37 AM    [place]

## 2019-05-10 NOTE — Patient Instructions (Signed)
Medication Instructions:  Your physician recommends that you continue on your current medications as directed. Please refer to the Current Medication list given to you today.  If you need a refill on your cardiac medications before your next appointment, please call your pharmacy.   Lab work: None ordered If you have labs (blood work) drawn today and your tests are completely normal, you will receive your results only by: [place] (if you have MyChart) OR A paper copy in the mail If you have any lab test that is abnormal or we need to change your treatment, we will call you to review the results.  Testing/Procedures: None ordered  Follow-Up: Follow up with Almyra Deforest, PA in April 2021

## 2019-05-12 ENCOUNTER — Telehealth: Payer: Self-pay

## 2019-05-12 NOTE — Telephone Encounter (Addendum)
Left voice message for the patient to give our office a call back to discuss his lab results.  ----- Message from [place], [place] sent at 05/10/2019  4:45 PM EDT ----- Liver function still borderline elevated, but stable when compare to 5 month ago. Bad cholesterol very well controlled, triglyceride only borderline elevated. Triglyceride has improved from previous 292 to now 195. Recommend continue current medication

## 2019-06-13 ENCOUNTER — Telehealth: Payer: Self-pay

## 2019-06-13 MED ORDER — IMIPRAMINE PAMOATE 150 MG PO CAPS: 150.0000 mg | ORAL_CAPSULE | Freq: Every day | ORAL | 3 refills | Status: DC

## 2019-06-13 NOTE — Telephone Encounter (Signed)
A prescription was sent in. 

## 2019-06-13 NOTE — Telephone Encounter (Signed)
Received a fax from [facility] where the patient is requesting a refill on his imipramine (TOFRANIL) 25 MG tablet

## 2019-06-13 NOTE — Addendum Note (Signed)
Addended by: Kathrynn Ducking on: 06/13/2019 08:25 AM   Modules accepted: Orders

## 2019-07-17 ENCOUNTER — Other Ambulatory Visit: Payer: Self-pay | Admitting: Cardiovascular Disease

## 2019-09-08 ENCOUNTER — Ambulatory Visit: Payer: Medicare Other | Admitting: Neurology

## 2019-09-12 ENCOUNTER — Telehealth: Payer: Self-pay | Admitting: Nurse Practitioner

## 2019-09-12 NOTE — Telephone Encounter (Signed)
I called [facility] and spoke to staff. THey show a rx of tofranil PM was sent 05/2019 for 9o pills and 3 refills. Pts prescription is good till 05/2020.

## 2019-09-12 NOTE — Telephone Encounter (Signed)
Pt has called asking for a 3 month refill on his imipramine (TOFRANIL-PM) 150 MG capsule  [id number]  Pt's 1 yr f/u is scheduled for 09-28-19 with Maralyn Sago, NP

## 2019-09-12 NOTE — Telephone Encounter (Signed)
We can fill the gabapentin for this month with an additional refill.  He does have an appointment to see Margie Ege in February and she can decide whether to continue

## 2019-09-12 NOTE — Telephone Encounter (Signed)
I called pt that he was given 90 day supply of tofranil Pm on 05/2019 and the pharmacy confirmed the rx. Pt verbalized understanding.  Pt also wanted refills on his gabapentin 100mg  and gabapentin 800mg . I stated it was last prescribed by NP and she retired. I stated it was not manage by Dr. . I stated message will be sent to Dr.Sater to review both gabapentin rx. Pt verbalized understanding.

## 2019-09-12 NOTE — Telephone Encounter (Signed)
Patient was given 90 day supply and 3 refills on 06/13/2019 which is a year and it was sent to [facility].

## 2019-09-13 ENCOUNTER — Other Ambulatory Visit: Payer: Self-pay

## 2019-09-13 MED ORDER — GABAPENTIN 800 MG PO TABS: 800.0000 mg | ORAL_TABLET | Freq: Four times a day (QID) | ORAL | 0 refills | Status: DC

## 2019-09-13 MED ORDER — GABAPENTIN 100 MG PO CAPS: ORAL_CAPSULE | ORAL | 0 refills | Status: DC

## 2019-09-17 ENCOUNTER — Other Ambulatory Visit: Payer: Self-pay | Admitting: Neurology

## 2019-09-28 ENCOUNTER — Ambulatory Visit (INDEPENDENT_AMBULATORY_CARE_PROVIDER_SITE_OTHER): Payer: 59 | Admitting: Neurology

## 2019-09-28 ENCOUNTER — Encounter: Payer: Self-pay | Admitting: Neurology

## 2019-09-28 ENCOUNTER — Other Ambulatory Visit: Payer: Self-pay

## 2019-09-28 VITALS — BP 121/86 | HR 81 | Temp 97.3°F | Ht 68.5 in | Wt 223.6 lb

## 2019-09-28 DIAGNOSIS — G609 Hereditary and idiopathic neuropathy, unspecified: Secondary | ICD-10-CM

## 2019-09-28 DIAGNOSIS — M545 Low back pain, unspecified: Secondary | ICD-10-CM

## 2019-09-28 MED ORDER — GABAPENTIN 100 MG PO CAPS: ORAL_CAPSULE | ORAL | 1 refills | Status: DC

## 2019-09-28 MED ORDER — GABAPENTIN 800 MG PO TABS: 800.0000 mg | ORAL_TABLET | Freq: Four times a day (QID) | ORAL | 3 refills | Status: DC

## 2019-09-28 NOTE — Patient Instructions (Signed)
It was great to meet you today. Try to slowly wean down the Zonegran, take 2 at bedtime x 1 week, then take 1 at bedtime, if you do well, stop it completely May consider eliminating the 100 mg gabapentin, in the future For now, continue same gabapentin and Lyrica dosing, along with Tofranil

## 2019-09-28 NOTE — Progress Notes (Signed)
PATIENT: Arthur Smith DOB: March 01, 1951  REASON FOR VISIT: follow up HISTORY FROM: patient  HISTORY OF PRESENT ILLNESS: Today 09/28/19  Arthur Smith is a 69 year old male with history of peripheral neuropathy. Is felt to be inherited.  He has chronic low back pain, has had a right L4-L5 laminectomy in 2016 with Dr. Venetia Maxon.  He is on gabapentin and Lyrica.  Apparently, he is also prescribed Tofranil and Zonegran from this office.  He has been on long-term doses of these medications, since at least 2014.  With all the medications, he says the neuropathy in his legs is under excellent control.  In the past, he says he has tried to decrease his dose of gabapentin, and he was miserable.  He has numbness intermittently from the knees down.  He has not had any falls.  He uses a cane for ambulation.  He lives with his wife, drives a car without difficulty.  He indicates he is in good health.  He indicates he is tolerating medications without side effect.  They help him sleep at night.  He presents today for evaluation unaccompanied.  HISTORY 09/02/2018 CM: HISTORY OF PRESENT ILLNESS:Arthur Smith is a 70 year old right-handed white male with peripheral neuropathy that is felt to be inherited, the patient's brother also has a neuropathy. The patient denies any significant balance issues. He denies any falls or stumbles. He also has chronic low back pain, had right L4-L5 laminectomy March 24th, 2016 by Dr. Venetia Maxon . The patient is on gabapentin and Lyrica combination, and this seems to work well for him. He has attempted to  taper down on gabapentin without success . He indicates that he sleeps fairly well at night. He returns to this office for further evaluation.  He has no new neurologic complaints  REVIEW OF SYSTEMS: Out of a complete 14 system review of symptoms, the patient complains only of the following symptoms, and all other reviewed systems are negative.  Numbness  ALLERGIES: Allergies  Allergen  Reactions  . Eggs Or Egg-Derived Products Other (See Comments)    Egg yolks cause stomach cramps and diarrhea  . Lactose Intolerance (Gi) Other (See Comments)    Causes stomach cramps and diarrhea    HOME MEDICATIONS: Outpatient Medications Prior to Visit  Medication Sig Dispense Refill  . albuterol (PROVENTIL HFA;VENTOLIN HFA) 108 (90 Base) MCG/ACT inhaler Inhale 2 puffs into the lungs every 6 (six) hours as needed for wheezing or shortness of breath (seasonal allergies).    Marland Kitchen aspirin EC 81 MG tablet Take 1 tablet (81 mg total) by mouth See admin instructions.    Marland Kitchen atorvastatin (LIPITOR) 80 MG tablet Take 1 tablet (80 mg total) by mouth at bedtime. 90 tablet 3  . carvedilol (COREG) 3.125 MG tablet TAKE 1 TABLET BY MOUTH TWICE A DAY WITH A MEAL 180 tablet 3  . cholecalciferol (VITAMIN D3) 25 MCG (1000 UT) tablet Take 1,000 Units by mouth daily at 6 PM.    . Cyanocobalamin 2500 MCG TABS Take 2,500 mcg by mouth daily at 6 PM. Vitamin B12    . finasteride (PROSCAR) 5 MG tablet Take 1 tablet (5 mg total) by mouth daily.  11  . imipramine (TOFRANIL-PM) 150 MG capsule Take 1 capsule (150 mg total) by mouth at bedtime. 90 capsule 3  . nitroGLYCERIN (NITROSTAT) 0.4 MG SL tablet Place 1 tablet (0.4 mg total) under the tongue every 5 (five) minutes as needed for chest pain. DON'T take within 24 hrs of Viagra 25  tablet 6  . Omega-3 Fatty Acids (FISH OIL) 1000 MG CAPS Take 1,000 mg by mouth daily at 6 PM.    . pregabalin (LYRICA) 100 MG capsule TAKE 1 CAPSULE BY MOUTH TWICE A DAY 180 capsule 1  . senna-docusate (SENNA S) 8.6-50 MG per tablet Take 2 tablets by mouth at bedtime as needed (constipation).     . sildenafil (VIAGRA) 50 MG tablet Take 50 mg by mouth daily as needed for erectile dysfunction.    . ticagrelor (BRILINTA) 90 MG TABS tablet Take 1 tablet (90 mg total) by mouth 2 (two) times daily. 180 tablet 3  . zonisamide (ZONEGRAN) 100 MG capsule TAKE 3 CAPSULES BY MOUTH ONCE DAILY AT BEDTIME  (Patient taking differently: Take 300 mg by mouth daily at 6 PM. ) 270 capsule 3  . gabapentin (NEURONTIN) 100 MG capsule TAKE 1 CAPSULE BY MOUTH 4 TIMES DAILY (TAKE WITH 800 MG TABLETS) 120 capsule 0  . gabapentin (NEURONTIN) 800 MG tablet Take 1 tablet (800 mg total) by mouth 4 (four) times daily. Take with 100mg  caps 120 tablet 0   No facility-administered medications prior to visit.    PAST MEDICAL HISTORY: Past Medical History:  Diagnosis Date  . Arthritis   . BPH without obstruction/lower urinary tract symptoms   . Bronchitis with asthma, acute April 2013  . CAD S/P percutaneous coronary angioplasty 11/15/2018   Severe 2 vessel obstructive CAD:. Ost-pCx 95% (DES PCI  STENT SYNERGY DES 3.5X28) &p-mCx 100% (DES PCI STENT SYNERGY DES 2.25X32). pRCA 90%, mRCA 85% & rPDA 90%.dLAD ~40%.  EF 55 to 60%.  Moderately elevated LVEDP.  No R WMA.  Plan staged PCI to 3 RCA-PDA lesions 4/1  . Depression    denies  . Frequency of urination    URINATION AT NIGHT, PAINFUL SLOW STREAM   . GERD (gastroesophageal reflux disease)    occ  . Hyperlipidemia   . Neuromuscular disorder ([place])    DIZZINESS   PERIPHERAL NEUROPATHY   . Neuropathy   . No pertinent past medical history    RINGING RIGHT EAR  . NSTEMI (non-ST elevated myocardial infarction) ([place]) 11/13/2018  . Recurrent upper respiratory infection (URI)    SOB CURRENT COLD  . Shortness of breath    d/t "cold" 12/02/11    PAST SURGICAL HISTORY: Past Surgical History:  Procedure Laterality Date  . CORONARY STENT INTERVENTION N/A 11/15/2018   Procedure: CORONARY STENT INTERVENTION;  Surgeon: Martinique, Peter M, MD;  Location: [facility];;  Ost-pCx 95% (DES PCI  STENT SYNERGY DES 3.5X28) &p-mCx 100% (DES PCI STENT SYNERGY DES 2.25X32)  . CORONARY STENT INTERVENTION N/A 11/16/2018   Procedure: CORONARY STENT INTERVENTION;  Surgeon: Belva Crome, MD;  Location: [facility];  Service: Cardiovascular;  Laterality: N/A;  . FINGER  SURGERY     RIGHT MIDDLE FINGER  . HERNIA REPAIR     UMBILICAL HERNIA 0109  . LEFT HEART CATH AND CORONARY ANGIOGRAPHY N/A 11/15/2018   Procedure: LEFT HEART CATH AND CORONARY ANGIOGRAPHY;  Surgeon: Martinique, Peter M, MD;  Location: [facility];; Severe 2 vessel obstructive CAD:. Ost-pCx 95% (DES PCI) &p-mCx 100% (DES PCI). pRCA 90%, mRCA 85% & rPDA 90%.dLAD ~40%.  EF 55 to 60%.  Moderately elevated LVEDP.  No R WMA.  -- Plan staged PCI to 3 RCA-PDA lesions 4/1  . LUMBAR LAMINECTOMY/DECOMPRESSION MICRODISCECTOMY  12/22/2011   Procedure: LUMBAR LAMINECTOMY/DECOMPRESSION MICRODISCECTOMY 1 LEVEL;  Surgeon: Erline Levine, MD;  Location: [facility];  Service: Neurosurgery;  Laterality: N/A;  Thoracic ten-eleven laminectomy  . LUMBAR LAMINECTOMY/DECOMPRESSION MICRODISCECTOMY Right 05/01/2014   Procedure: Right Lumbar three-four Microdiskectomy;  Surgeon: Maeola Harman, MD;  Location: [facility];  Service: Neurosurgery;  Laterality: Right;  . LUMBAR LAMINECTOMY/DECOMPRESSION MICRODISCECTOMY Right 11/08/2014   Procedure: Right L4-5 L5-S1 Laminectomy;  Surgeon: Maeola Harman, MD;  Location: [facility];  Service: Neurosurgery;  Laterality: Right;  Right L4-5 L5-S1 Laminectomy  . MULTIPLE TOOTH EXTRACTIONS      FAMILY HISTORY: Family History  Problem Relation Age of Onset  . Cancer Mother   . Stroke Father   . Neuropathy Brother     SOCIAL HISTORY: Social History   Socioeconomic History  . Marital status: Married    Spouse name: [name]  . Number of children: 3  . Years of education: 39  . Highest education level: Not on file  Occupational History  . Occupation: Retired  Tobacco Use  . Smoking status: Former Smoker    Packs/day: 2.00    Years: 15.00    Pack years: 30.00    Types: Cigarettes    Quit date: 04/24/1984    Years since quitting: 35.4  . Smokeless tobacco: Never Used  Substance and Sexual Activity  . Alcohol use: Yes    Comment: occ  . Drug use: No  . Sexual activity: Not  on file  Other Topics Concern  . Not on file  Social History Narrative   Patient lives at home [name] his wife.    Patient has 3 adult children and 1 step son.    Patient has a high school education.    Patient is retired.    [employer]    Social Determinants of Health   Financial Resource Strain:   . Difficulty of Paying Living Expenses: Not on file  Food Insecurity:   . Worried About Programme researcher, broadcasting/film/video in the Last Year: Not on file  . Ran Out of Food in the Last Year: Not on file  Transportation Needs:   . Lack of Transportation (Medical): Not on file  . Lack of Transportation (Non-Medical): Not on file  Physical Activity:   . Days of Exercise per Week: Not on file  . Minutes of Exercise per Session: Not on file  Stress:   . Feeling of Stress : Not on file  Social Connections:   . Frequency of Communication with Friends and Family: Not on file  . Frequency of Social Gatherings with Friends and Family: Not on file  . Attends Religious Services: Not on file  . Active Member of Clubs or Organizations: Not on file  . Attends Banker Meetings: Not on file  . Marital Status: Not on file  Intimate Partner Violence:   . Fear of Current or Ex-Partner: Not on file  . Emotionally Abused: Not on file  . Physically Abused: Not on file  . Sexually Abused: Not on file   PHYSICAL EXAM  Vitals:   09/28/19 1250  BP: 121/86  Pulse: 81  Temp: (!) 97.3 F (36.3 C)  Weight: 223 lb 9.6 oz (101.4 kg)  Height: 5' 8.5" (1.74 m)   Body mass index is 33.5 kg/m.  Generalized: Well developed, in no acute distress   Neurological examination  Mentation: Alert oriented to time, place, history taking. Follows all commands speech and language fluent Cranial nerve II-XII: Pupils were equal round reactive to light. Extraocular movements were full, visual field were full on confrontational test. Facial  sensation and strength were normal. Head turning and shoulder shrug  were normal and  symmetric. Motor: The motor testing reveals 5 over 5 strength of all 4 extremities. Good symmetric motor tone is noted throughout.  Sensory: Decreased pinprick, vibration sensation to mid shin bilaterally Coordination: Cerebellar testing reveals good finger-nose-finger and heel-to-shin bilaterally.  Gait and station: Gait is normal. Tandem gait is normal. Romberg is negative. No drift is seen.  Reflexes: Deep tendon reflexes are symmetric   DIAGNOSTIC DATA (LABS, IMAGING, TESTING) - I reviewed patient records, labs, notes, testing and imaging myself where available.  Lab Results  Component Value Date   WBC 8.5 11/17/2018   HGB 12.9 (L) 11/17/2018   HCT 39.3 11/17/2018   MCV 91.2 11/17/2018   PLT 213 11/17/2018      Component Value Date/Time   NA 137 11/17/2018 0243   K 3.6 11/17/2018 0243   CL 110 11/17/2018 0243   CO2 20 (L) 11/17/2018 0243   GLUCOSE 141 (H) 11/17/2018 0243   BUN 14 11/17/2018 0243   CREATININE 1.06 11/17/2018 0243   CALCIUM 8.4 (L) 11/17/2018 0243   PROT 6.5 05/10/2019 0921   ALBUMIN 4.3 05/10/2019 0921   AST 34 05/10/2019 0921   ALT 52 (H) 05/10/2019 0921   ALKPHOS 142 (H) 05/10/2019 0921   BILITOT 0.8 05/10/2019 0921   GFRNONAA >60 11/17/2018 0243   GFRAA >60 11/17/2018 0243   Lab Results  Component Value Date   CHOL 136 05/10/2019   HDL 36 (L) 05/10/2019   LDLCALC 67 05/10/2019   TRIG 195 (H) 05/10/2019   CHOLHDL 3.8 05/10/2019   No results found for: HGBA1C No results found for: VITAMINB12 No results found for: TSH    ASSESSMENT AND PLAN 69 y.o. year old male  has a past medical history of Arthritis, BPH without obstruction/lower urinary tract symptoms, Bronchitis with asthma, acute (April 2013), CAD S/P percutaneous coronary angioplasty (11/15/2018), Depression, Frequency of urination, GERD (gastroesophageal reflux disease), Hyperlipidemia, Neuromuscular disorder ([facility]), Neuropathy, No pertinent past medical history, NSTEMI (non-ST elevated  myocardial infarction) ([facility]) (11/13/2018), Recurrent upper respiratory infection (URI), and Shortness of breath. here with:  1.  Peripheral neuropathy 2.  Chronic low back pain  Arthur Smith has remained on the same medications as far back as 2014.  Fortunately, his neuropathy symptoms are under excellent control.  We discussed the multitude of medications he is on.  He is on max dose of gabapentin, along with low-dose Lyrica.  He is agreeable to try dose reduction of Zonegran.  He is taking Zonegran 300 mg at bedtime, he will try to decrease his dose by taking 200 at bedtime x1 week, then take 100 mg x 1 week, if his symptoms remain under good control, he may stop the medication.  For now, he will remain on gabapentin, Lyrica, and Tofranil.  I did only refill the gabapentin 100 mg capsules for 6 months, with the idea he may try to eliminate those in the future.  He will follow-up here in 1 year or sooner if needed.  Continue gabapentin 800 mg, 4 times a day Continue gabapentin 100 mg capsule, 4 times a day, with 800 mg Continue Lyrica 100 mg twice a day Continue Tofranil 150 mg at bedtime We will try to discontinue Zonegran 300 mg at bedtime   I spent 15 minutes with the patient. 50% of this time was spent discussing his plan of care.   Margie Ege, AGNP-C, DNP 09/28/2019, 1:55 PM [facility]  [address], [place], [place] [address] [phone]

## 2019-10-01 NOTE — Progress Notes (Signed)
I have read the note, and I agree with the clinical assessment and plan.  Sharnetta Gielow K Ronon Ferger   

## 2019-10-03 ENCOUNTER — Telehealth: Payer: Self-pay | Admitting: *Deleted

## 2019-10-03 NOTE — Telephone Encounter (Signed)
Unable to leave a message on this voicemail.

## 2019-11-10 ENCOUNTER — Encounter: Payer: Medicare Other | Admitting: Physician Assistant

## 2019-11-12 NOTE — Progress Notes (Signed)
This encounter was created in error - please disregard.

## 2019-11-21 ENCOUNTER — Encounter: Payer: Self-pay | Admitting: Physician Assistant

## 2019-11-21 DIAGNOSIS — Z125 Encounter for screening for malignant neoplasm of prostate: Secondary | ICD-10-CM | POA: Diagnosis not present

## 2019-11-21 DIAGNOSIS — N3941 Urge incontinence: Secondary | ICD-10-CM | POA: Diagnosis not present

## 2019-11-21 DIAGNOSIS — N401 Enlarged prostate with lower urinary tract symptoms: Secondary | ICD-10-CM | POA: Diagnosis not present

## 2019-11-27 ENCOUNTER — Telehealth (INDEPENDENT_AMBULATORY_CARE_PROVIDER_SITE_OTHER): Payer: 59 | Admitting: Physician Assistant

## 2019-11-27 ENCOUNTER — Encounter: Payer: Self-pay | Admitting: Physician Assistant

## 2019-11-27 ENCOUNTER — Telehealth: Payer: Self-pay

## 2019-11-27 VITALS — BP 119/78 | HR 65 | Ht 68.0 in | Wt 220.0 lb

## 2019-11-27 DIAGNOSIS — Z87891 Personal history of nicotine dependence: Secondary | ICD-10-CM | POA: Diagnosis not present

## 2019-11-27 DIAGNOSIS — E782 Mixed hyperlipidemia: Secondary | ICD-10-CM | POA: Diagnosis not present

## 2019-11-27 DIAGNOSIS — I2510 Atherosclerotic heart disease of native coronary artery without angina pectoris: Secondary | ICD-10-CM | POA: Diagnosis not present

## 2019-11-27 DIAGNOSIS — Z7982 Long term (current) use of aspirin: Secondary | ICD-10-CM | POA: Diagnosis not present

## 2019-11-27 MED ORDER — CLOPIDOGREL BISULFATE 75 MG PO TABS: 75.0000 mg | ORAL_TABLET | Freq: Every day | ORAL | 3 refills | Status: DC

## 2019-11-27 NOTE — Progress Notes (Signed)
Virtual Visit via Telephone Note   This visit type was conducted due to national recommendations for restrictions regarding the COVID-19 Pandemic (e.g. social distancing) in an effort to limit this patient's exposure and mitigate transmission in our community.  Due to his co-morbid illnesses, this patient is at least at moderate risk for complications without adequate follow up.  This format is felt to be most appropriate for this patient at this time.  The patient did not have access to video technology/had technical difficulties with video requiring transitioning to audio format only (telephone).  All issues noted in this document were discussed and addressed.  No physical exam could be performed with this format.  Please refer to the patient's chart for his  consent to telehealth for [facility].   The patient was identified using 2 identifiers.  Date:  11/27/2019   ID:  Arthur Smith, DOB [birth year], MRN [id number]  Patient Location: Home Provider Location: Home  PCP:  Lavone Orn, MD  Cardiologist:  Sanda Klein, MD  Electrophysiologist:  None   Evaluation Performed:  Follow-Up Visit  Chief Complaint:  followup  History of Present Illness:    Arthur Smith is a 69 y.o. male with past medical history of CAD, peripheral neuropathy and hyperlipidemia.  Patient had NSTEMI in March 2020 and found to have high-grade stenosis in both left circumflex and RCA.  He eventually underwent PCI and placement of 4 drug-eluting stents to proximal left circumflex, distal left circumflex, RCA and PDA.  He was last seen by Dr. Sallyanne Kuster on 05/10/2019 at which time he was doing well from cardiology perspective.  Lipid panel obtained in 62-monthago showed borderline elevated triglyceride, however very well-controlled total cholesterol and LDL.   Patient was contacted today via telephone visit.  He denies any recent chest pain or shortness of breath.  Activity is limited by back pain.  Overall he  is doing well without any cardiac symptoms.  Since he is 1 year out from the stent placement, we discussed 3 different options, one is to take him off the Brilinta, second option is to switch Brilinta to Plavix, third option is to lower the Brilinta dose down to 60 mg twice daily maintenance therapy.  His wife is who is a rEquities traderprefer he to stay on antiplatelet therapy.  Therefore we opted to switch his Brilinta to Plavix therapy after he finished the current bottle.  He is still taking fish oil 1 capsule/day.  I recommend a repeat fasting lipid panel and LFT.   The patient does not have symptoms concerning for COVID-19 infection (fever, chills, cough, or new shortness of breath).    Past Medical History:  Diagnosis Date  . Arthritis   . BPH without obstruction/lower urinary tract symptoms   . Bronchitis with asthma, acute April 2013  . CAD S/P percutaneous coronary angioplasty 11/15/2018   Severe 2 vessel obstructive CAD:. Ost-pCx 95% (DES PCI  STENT SYNERGY DES 3.5X28) &p-mCx 100% (DES PCI STENT SYNERGY DES 2.25X32). pRCA 90%, mRCA 85% & rPDA 90%.dLAD ~40%.  EF 55 to 60%.  Moderately elevated LVEDP.  No R WMA.  Plan staged PCI to 3 RCA-PDA lesions 4/1  . Depression    denies  . Frequency of urination    URINATION AT NIGHT, PAINFUL SLOW STREAM   . GERD (gastroesophageal reflux disease)    occ  . Hyperlipidemia   . Neuromuscular disorder ([place]    DIZZINESS   PERIPHERAL NEUROPATHY   . Neuropathy   .  No pertinent past medical history    RINGING RIGHT EAR  . NSTEMI (non-ST elevated myocardial infarction) ([place]) 11/13/2018  . Recurrent upper respiratory infection (URI)    SOB CURRENT COLD  . Shortness of breath    d/t "cold" 12/02/11   Past Surgical History:  Procedure Laterality Date  . CORONARY STENT INTERVENTION N/A 11/15/2018   Procedure: CORONARY STENT INTERVENTION;  Surgeon: Martinique, Peter M, MD;  Location: [place] INVASIVE CV LAB;;  Ost-pCx 95% (DES PCI  STENT SYNERGY DES  3.5X28) &p-mCx 100% (DES PCI STENT SYNERGY DES 2.25X32)  . CORONARY STENT INTERVENTION N/A 11/16/2018   Procedure: CORONARY STENT INTERVENTION;  Surgeon: Belva Crome, MD;  Location: [place] CV LAB;  Service: Cardiovascular;  Laterality: N/A;  . FINGER SURGERY     RIGHT MIDDLE FINGER  . HERNIA REPAIR     UMBILICAL HERNIA [id number]  . LEFT HEART CATH AND CORONARY ANGIOGRAPHY N/A 11/15/2018   Procedure: LEFT HEART CATH AND CORONARY ANGIOGRAPHY;  Surgeon: Martinique, Peter M, MD;  Location: [place] CV LAB;; Severe 2 vessel obstructive CAD:. Ost-pCx 95% (DES PCI) &p-mCx 100% (DES PCI). pRCA 90%, mRCA 85% & rPDA 90%.dLAD ~40%.  EF 55 to 60%.  Moderately elevated LVEDP.  No R WMA.  -- Plan staged PCI to 3 RCA-PDA lesions 4/1  . LUMBAR LAMINECTOMY/DECOMPRESSION MICRODISCECTOMY  12/22/2011   Procedure: LUMBAR LAMINECTOMY/DECOMPRESSION MICRODISCECTOMY 1 LEVEL;  Surgeon: Erline Levine, MD;  Location: [place] NEURO ORS;  Service: Neurosurgery;  Laterality: N/A;  Thoracic ten-eleven laminectomy  . LUMBAR LAMINECTOMY/DECOMPRESSION MICRODISCECTOMY Right 05/01/2014   Procedure: Right Lumbar three-four Microdiskectomy;  Surgeon: Erline Levine, MD;  Location: [place] NEURO ORS;  Service: Neurosurgery;  Laterality: Right;  . LUMBAR LAMINECTOMY/DECOMPRESSION MICRODISCECTOMY Right 11/08/2014   Procedure: Right L4-5 L5-S1 Laminectomy;  Surgeon: Erline Levine, MD;  Location: [place] NEURO ORS;  Service: Neurosurgery;  Laterality: Right;  Right L4-5 L5-S1 Laminectomy  . MULTIPLE TOOTH EXTRACTIONS       Current Meds  Medication Sig  . aspirin EC 81 MG tablet Take 1 tablet (81 mg total) by mouth See admin instructions.  Marland Kitchen atorvastatin (LIPITOR) 80 MG tablet Take 1 tablet (80 mg total) by mouth at bedtime.  . carvedilol (COREG) 3.125 MG tablet TAKE 1 TABLET BY MOUTH TWICE A DAY WITH A MEAL  . cholecalciferol (VITAMIN D3) 25 MCG (1000 UT) tablet Take 1,000 Units by mouth daily at 6 PM.  . Cyanocobalamin 2500 MCG TABS Take 2,500 mcg by mouth  daily at 6 PM. Vitamin B12  . finasteride (PROSCAR) 5 MG tablet Take 1 tablet (5 mg total) by mouth daily.  Marland Kitchen gabapentin (NEURONTIN) 100 MG capsule TAKE 1 CAPSULE BY MOUTH 4 TIMES DAILY (TAKE WITH 800 MG TABLETS)  . gabapentin (NEURONTIN) 800 MG tablet Take 1 tablet (800 mg total) by mouth 4 (four) times daily. Take with 189m caps  . imipramine (TOFRANIL-PM) 150 MG capsule Take 1 capsule (150 mg total) by mouth at bedtime.  . Omega-3 Fatty Acids (FISH OIL) 1000 MG CAPS Take 1,000 mg by mouth daily at 6 PM.  . Phenazopyridine HCl (AZO URINARY PAIN RELIEF PO) Take by mouth.  . pregabalin (LYRICA) 100 MG capsule TAKE 1 CAPSULE BY MOUTH TWICE A DAY  . senna-docusate (SENNA S) 8.6-50 MG per tablet Take 2 tablets by mouth at bedtime as needed (constipation).   . solifenacin (VESICARE) 10 MG tablet Take 5 mg by mouth daily.  . ticagrelor (BRILINTA) 90 MG TABS tablet Take 1 tablet (90 mg total)  by mouth 2 (two) times daily.  Marland Kitchen zonisamide (ZONEGRAN) 100 MG capsule TAKE 3 CAPSULES BY MOUTH ONCE DAILY AT BEDTIME (Patient taking differently: Take 300 mg by mouth daily at 6 PM. )     Allergies:   Eggs or egg-derived products and Lactose intolerance (gi)   Social History   Tobacco Use  . Smoking status: Former Smoker    Packs/day: 2.00    Years: 15.00    Pack years: 30.00    Types: Cigarettes    Quit date: 04/24/1984    Years since quitting: 35.6  . Smokeless tobacco: Never Used  Substance Use Topics  . Alcohol use: Yes    Comment: occ  . Drug use: No     Family Hx: The patient's family history includes Cancer in his mother; Neuropathy in his brother; Stroke in his father.  ROS:   Please see the history of present illness.     All other systems reviewed and are negative.   Prior CV studies:   The following studies were reviewed today:  Cath 11/15/2018  Prox RCA lesion is 90% stenosed.  Mid RCA lesion is 85% stenosed.  RPDA lesion is 90% stenosed.  Dist LAD lesion is 40%  stenosed.  Ost Cx to Prox Cx lesion is 95% stenosed.  A drug-eluting stent was successfully placed using a STENT SYNERGY DES 3.5X28.  Prox Cx to Mid Cx lesion is 100% stenosed.  Post intervention, there is a 0% residual stenosis.  A drug-eluting stent was successfully placed using a STENT SYNERGY DES 2.25X32.  Post intervention, there is a 0% residual stenosis.  The left ventricular systolic function is normal.  LV end diastolic pressure is moderately elevated.  The left ventricular ejection fraction is 55-65% by visual estimate.   1. Severe 2 vessel obstructive CAD.    - 95% proximal LCx. 100% distal LCx- this is the culprit lesion    - 90% proximal RCA, 85% segmental mid RCA, 90% PDA 2. Normal LV function 3. Moderately elevated LVEDP 4. Successful PCI of the proximal and distal LCX with DES x 2.   Plan: DAPT for one year. Hydrate and observe renal function. Planned for stage PCI of the RCA and PDA in next 48 hours.    Cath 11/16/2018  A stent was successfully placed.  A stent was successfully placed.    Successful RCA PCI resulting in proximal 90% stenosis, mid to distal 85% stenosis and PDA 90% stenosis being reduced to 0%, 0%, and 0% using Onyx drug-eluting stents.  TIMI grade III flow was noted.  The RCA proper was treated with 3.5 mm Onyx stents postdilated to 3.75 mm.  The PDA was treated with 2.0 Onyx stent deployed at 14 atm.  RECOMMENDATIONS:   Aspirin and Brilinta for at least 12 months.  Consider dropping aspirin after 3 months if any issue with bleeding.  Aggressive risk factor modification.  Anticipate discharge in a.m.  Labs/Other Tests and Data Reviewed:    EKG:  An ECG dated 11/17/2018 was personally reviewed today and demonstrated:  Normal sinus rhythm without significant ST-T wave changes.  Recent Labs: 05/10/2019: ALT 52   Recent Lipid Panel Lab Results  Component Value Date/Time   CHOL 136 05/10/2019 09:20 AM   TRIG 195 (H) 05/10/2019  09:20 AM   HDL 36 (L) 05/10/2019 09:20 AM   CHOLHDL 3.8 05/10/2019 09:20 AM   CHOLHDL 4.8 11/15/2018 02:40 AM   LDLCALC 67 05/10/2019 09:20 AM    Wt Readings from  Last 3 Encounters:  11/27/19 220 lb (99.8 kg)  09/28/19 223 lb 9.6 oz (101.4 kg)  05/10/19 212 lb 9.6 oz (96.4 kg)     Objective:    Vital Signs:  BP 119/78   Pulse 65   Ht 5' 8" (1.727 m)   Wt 220 lb (99.8 kg)   BMI 33.45 kg/m    VITAL SIGNS:  reviewed  ASSESSMENT & PLAN:    1. CAD: He denies any chest pain.  Continue aspirin, Lipitor and carvedilol.  We discussed 3 different options since he is 1 year out from the stent placement.  He eventually opted to switch Brilinta to Plavix after finishing the current bottle.  2. Hyperlipidemia: On Lipitor and fish oil.  He is only taking 1000 mg daily of fish oil.  Previous blood work obtained 6 months ago showed a mildly elevated triglyceride.  I recommend a fasting lipid panel and LFT.  If triglyceride is still elevated, I will increase his fish oil to twice daily dosing.  COVID-19 Education: The signs and symptoms of COVID-19 were discussed with the patient and how to seek care for testing (follow up with PCP or arrange E-visit).  The importance of social distancing was discussed today.  Time:   Today, I have spent 10 minutes with the patient with telehealth technology discussing the above problems.     Medication Adjustments/Labs and Tests Ordered: Current medicines are reviewed at length with the patient today.  Concerns regarding medicines are outlined above.   Tests Ordered: No orders of the defined types were placed in this encounter.   Medication Changes: No orders of the defined types were placed in this encounter.   Follow Up:  Either In Person or Virtual in 6 month(s)  Signed, Almyra Deforest, [place]  11/27/2019 11:01 AM    [place]

## 2019-11-27 NOTE — Patient Instructions (Addendum)
Medication Instructions:   Doreatha Martin current bottle of Brilinta then STOP  START Plavix 75 mg daily after completing Brilinta (DO NOT START UNTIL CURRENT BOTTLE OF BRILINTA IS FINISHED/COMPLETED)  *If you need a refill on your cardiac medications before your next appointment, please call your pharmacy*  Lab Work: Your physician recommends that you return for lab work in 1-2 months:   FASTING LIPID PANEL-DO NOT EAT OR DRINK PAST MIDNIGHT. OK TO HAVE WATER  HEPATIC (LIVER) FUNCTION TEST  If you have labs (blood work) drawn today and your tests are completely normal, you will receive your results only by: Marland Kitchen MyChart Message (if you have MyChart) OR . A paper copy in the mail If you have any lab test that is abnormal or we need to change your treatment, we will call you to review the results.  Testing/Procedures: NONE ordered at this time of appointment   Follow-Up: At [facility] you and your health needs are our priority.  As part of our continuing mission to provide you with exceptional heart care, we have created designated Provider Care Teams.  These Care Teams include your primary Cardiologist (physician) and Advanced Practice Providers (APPs -  Physician Assistants and Nurse Practitioners) who all work together to provide you with the care you need, when you need it.  We recommend signing up for the patient portal called "MyChart".  Sign up information is provided on this After Visit Summary.  MyChart is used to connect with patients for Virtual Visits (Telemedicine).  Patients are able to view lab/test results, encounter notes, upcoming appointments, etc.  Non-urgent messages can be sent to your provider as well.   To learn more about what you can do with MyChart, go to [email or web address].    Your next appointment:   6 month(s)  The format for your next appointment:   In Person  Provider:   Thurmon Fair, MD  Other Instructions

## 2019-11-27 NOTE — Telephone Encounter (Signed)
  Patient Consent for Virtual Visit         Arthur GARCIAGARCIA has provided verbal consent on 11/27/2019 for a virtual visit (video or telephone).   CONSENT FOR VIRTUAL VISIT FOR:  Arthur Smith  By participating in this virtual visit I agree to the following:  I hereby voluntarily request, consent and authorize [facility] and its employed or contracted physicians, physician assistants, nurse practitioners or other licensed health care professionals (the Practitioner), to provide me with telemedicine health care services (the "Services") as deemed necessary by the treating Practitioner. I acknowledge and consent to receive the Services by the Practitioner via telemedicine. I understand that the telemedicine visit will involve communicating with the Practitioner through live audiovisual communication technology and the disclosure of certain medical information by electronic transmission. I acknowledge that I have been given the opportunity to request an in-person assessment or other available alternative prior to the telemedicine visit and am voluntarily participating in the telemedicine visit.  I understand that I have the right to withhold or withdraw my consent to the use of telemedicine in the course of my care at any time, without affecting my right to future care or treatment, and that the Practitioner or I may terminate the telemedicine visit at any time. I understand that I have the right to inspect all information obtained and/or recorded in the course of the telemedicine visit and may receive copies of available information for a reasonable fee.  I understand that some of the potential risks of receiving the Services via telemedicine include:  Marland Kitchen Delay or interruption in medical evaluation due to technological equipment failure or disruption; . Information transmitted may not be sufficient (e.g. poor resolution of images) to allow for appropriate medical decision making by the Practitioner;  and/or  . In rare instances, security protocols could fail, causing a breach of personal health information.  Furthermore, I acknowledge that it is my responsibility to provide information about my medical history, conditions and care that is complete and accurate to the best of my ability. I acknowledge that Practitioner's advice, recommendations, and/or decision may be based on factors not within their control, such as incomplete or inaccurate data provided by me or distortions of diagnostic images or specimens that may result from electronic transmissions. I understand that the practice of medicine is not an exact science and that Practitioner makes no warranties or guarantees regarding treatment outcomes. I acknowledge that a copy of this consent can be made available to me via my patient portal [facility] MyChart), or I can request a printed copy by calling the office of [facility].    I understand that my insurance will be billed for this visit.   I have read or had this consent read to me. . I understand the contents of this consent, which adequately explains the benefits and risks of the Services being provided via telemedicine.  . I have been provided ample opportunity to ask questions regarding this consent and the Services and have had my questions answered to my satisfaction. . I give my informed consent for the services to be provided through the use of telemedicine in my medical care

## 2019-12-01 ENCOUNTER — Observation Stay (HOSPITAL_COMMUNITY)
Admission: EM | Admit: 2019-12-01 | Discharge: 2019-12-03 | Disposition: A | Discharge status: Home / Self Care | Payer: 59 | Attending: Internal Medicine | Admitting: Internal Medicine

## 2019-12-01 ENCOUNTER — Encounter (HOSPITAL_COMMUNITY): Payer: Self-pay | Admitting: Emergency Medicine

## 2019-12-01 ENCOUNTER — Other Ambulatory Visit: Payer: Self-pay

## 2019-12-01 DIAGNOSIS — E1165 Type 2 diabetes mellitus with hyperglycemia: Secondary | ICD-10-CM | POA: Diagnosis not present

## 2019-12-01 DIAGNOSIS — Z79899 Other long term (current) drug therapy: Secondary | ICD-10-CM | POA: Insufficient documentation

## 2019-12-01 DIAGNOSIS — Z7984 Long term (current) use of oral hypoglycemic drugs: Secondary | ICD-10-CM | POA: Insufficient documentation

## 2019-12-01 DIAGNOSIS — N179 Acute kidney failure, unspecified: Secondary | ICD-10-CM | POA: Diagnosis not present

## 2019-12-01 DIAGNOSIS — N289 Disorder of kidney and ureter, unspecified: Secondary | ICD-10-CM

## 2019-12-01 DIAGNOSIS — R339 Retention of urine, unspecified: Principal | ICD-10-CM | POA: Insufficient documentation

## 2019-12-01 DIAGNOSIS — R1030 Lower abdominal pain, unspecified: Secondary | ICD-10-CM | POA: Diagnosis present

## 2019-12-01 DIAGNOSIS — R110 Nausea: Secondary | ICD-10-CM | POA: Diagnosis not present

## 2019-12-01 DIAGNOSIS — Z20822 Contact with and (suspected) exposure to covid-19: Secondary | ICD-10-CM | POA: Insufficient documentation

## 2019-12-01 DIAGNOSIS — R739 Hyperglycemia, unspecified: Secondary | ICD-10-CM | POA: Diagnosis present

## 2019-12-01 DIAGNOSIS — Z7902 Long term (current) use of antithrombotics/antiplatelets: Secondary | ICD-10-CM | POA: Insufficient documentation

## 2019-12-01 DIAGNOSIS — N400 Enlarged prostate without lower urinary tract symptoms: Secondary | ICD-10-CM | POA: Insufficient documentation

## 2019-12-01 DIAGNOSIS — K2980 Duodenitis without bleeding: Secondary | ICD-10-CM | POA: Diagnosis not present

## 2019-12-01 DIAGNOSIS — I2510 Atherosclerotic heart disease of native coronary artery without angina pectoris: Secondary | ICD-10-CM

## 2019-12-01 DIAGNOSIS — Z03818 Encounter for observation for suspected exposure to other biological agents ruled out: Secondary | ICD-10-CM | POA: Diagnosis not present

## 2019-12-01 DIAGNOSIS — R338 Other retention of urine: Secondary | ICD-10-CM | POA: Diagnosis present

## 2019-12-01 DIAGNOSIS — Z9861 Coronary angioplasty status: Secondary | ICD-10-CM | POA: Diagnosis not present

## 2019-12-01 DIAGNOSIS — IMO0002 Reserved for concepts with insufficient information to code with codable children: Secondary | ICD-10-CM | POA: Diagnosis present

## 2019-12-01 LAB — COMPREHENSIVE METABOLIC PANEL
ALT: 52 U/L — ABNORMAL HIGH (ref 0–44)
AST: 20 U/L (ref 15–41)
Albumin: 4.2 g/dL (ref 3.5–5.0)
Alkaline Phosphatase: 128 U/L — ABNORMAL HIGH (ref 38–126)
Anion gap: 11 (ref 5–15)
BUN: 20 mg/dL (ref 8–23)
CO2: 23 mmol/L (ref 22–32)
Calcium: 9.6 mg/dL (ref 8.9–10.3)
Chloride: 99 mmol/L (ref 98–111)
Creatinine, Ser: 2.57 mg/dL — ABNORMAL HIGH (ref 0.61–1.24)
GFR calc Af Amer: 29 mL/min — ABNORMAL LOW (ref >60)
GFR calc non Af Amer: 25 mL/min — ABNORMAL LOW (ref >60)
Glucose, Bld: 490 mg/dL — ABNORMAL HIGH (ref 70–99)
Potassium: 5 mmol/L (ref 3.5–5.1)
Sodium: 133 mmol/L — ABNORMAL LOW (ref 135–145)
Total Bilirubin: 1.7 mg/dL — ABNORMAL HIGH (ref 0.3–1.2)
Total Protein: 7.2 g/dL (ref 6.5–8.1)

## 2019-12-01 LAB — CBC
HCT: 46 % (ref 39.0–52.0)
Hemoglobin: 15.3 g/dL (ref 13.0–17.0)
MCH: 30.8 pg (ref 26.0–34.0)
MCHC: 33.3 g/dL (ref 30.0–36.0)
MCV: 92.6 fL (ref 80.0–100.0)
Platelets: 267 10*3/uL (ref 150–400)
RBC: 4.97 MIL/uL (ref 4.22–5.81)
RDW: 13.1 % (ref 11.5–15.5)
WBC: 16.6 10*3/uL — ABNORMAL HIGH (ref 4.0–10.5)
nRBC: 0 % (ref 0.0–0.2)

## 2019-12-01 LAB — URINALYSIS, ROUTINE W REFLEX MICROSCOPIC
Bacteria, UA: NONE SEEN
Bilirubin Urine: NEGATIVE
Glucose, UA: >=500 mg/dL — AB
Hgb urine dipstick: NEGATIVE
Ketones, ur: NEGATIVE mg/dL
Leukocytes,Ua: NEGATIVE
Nitrite: NEGATIVE
Protein, ur: NEGATIVE mg/dL
Specific Gravity, Urine: 1.024 (ref 1.005–1.030)
pH: 6 (ref 5.0–8.0)

## 2019-12-01 LAB — LIPASE, BLOOD: Lipase: 18 U/L (ref 11–51)

## 2019-12-01 MED ORDER — SODIUM CHLORIDE 0.9% FLUSH
3.0000 mL | Freq: Once | INTRAVENOUS | Status: AC
  Administered 2019-12-02: 3 mL via INTRAVENOUS

## 2019-12-01 NOTE — ED Triage Notes (Signed)
Pt c/o 10/10 lower abd pain for the past 3 days unable to have a BM. Pt states he is nauseated but no vomiting.

## 2019-12-02 ENCOUNTER — Encounter (HOSPITAL_COMMUNITY): Payer: Self-pay | Admitting: Family Medicine

## 2019-12-02 ENCOUNTER — Emergency Department (HOSPITAL_COMMUNITY): Payer: 59

## 2019-12-02 DIAGNOSIS — R739 Hyperglycemia, unspecified: Secondary | ICD-10-CM

## 2019-12-02 DIAGNOSIS — N3289 Other specified disorders of bladder: Secondary | ICD-10-CM | POA: Diagnosis not present

## 2019-12-02 DIAGNOSIS — R110 Nausea: Secondary | ICD-10-CM | POA: Diagnosis not present

## 2019-12-02 DIAGNOSIS — K2980 Duodenitis without bleeding: Secondary | ICD-10-CM

## 2019-12-02 DIAGNOSIS — Z7984 Long term (current) use of oral hypoglycemic drugs: Secondary | ICD-10-CM | POA: Diagnosis not present

## 2019-12-02 DIAGNOSIS — R338 Other retention of urine: Secondary | ICD-10-CM

## 2019-12-02 DIAGNOSIS — I2510 Atherosclerotic heart disease of native coronary artery without angina pectoris: Secondary | ICD-10-CM | POA: Diagnosis not present

## 2019-12-02 DIAGNOSIS — N400 Enlarged prostate without lower urinary tract symptoms: Secondary | ICD-10-CM | POA: Diagnosis not present

## 2019-12-02 DIAGNOSIS — Z9861 Coronary angioplasty status: Secondary | ICD-10-CM | POA: Diagnosis not present

## 2019-12-02 DIAGNOSIS — E1165 Type 2 diabetes mellitus with hyperglycemia: Secondary | ICD-10-CM | POA: Diagnosis not present

## 2019-12-02 DIAGNOSIS — R339 Retention of urine, unspecified: Secondary | ICD-10-CM | POA: Diagnosis not present

## 2019-12-02 DIAGNOSIS — N289 Disorder of kidney and ureter, unspecified: Secondary | ICD-10-CM

## 2019-12-02 DIAGNOSIS — R109 Unspecified abdominal pain: Secondary | ICD-10-CM | POA: Diagnosis not present

## 2019-12-02 DIAGNOSIS — Z20822 Contact with and (suspected) exposure to covid-19: Secondary | ICD-10-CM | POA: Diagnosis not present

## 2019-12-02 LAB — BASIC METABOLIC PANEL
Anion gap: 10 (ref 5–15)
BUN: 23 mg/dL (ref 8–23)
CO2: 23 mmol/L (ref 22–32)
Calcium: 9.1 mg/dL (ref 8.9–10.3)
Chloride: 103 mmol/L (ref 98–111)
Creatinine, Ser: 2.99 mg/dL — ABNORMAL HIGH (ref 0.61–1.24)
GFR calc Af Amer: 24 mL/min — ABNORMAL LOW (ref >60)
GFR calc non Af Amer: 20 mL/min — ABNORMAL LOW (ref >60)
Glucose, Bld: 401 mg/dL — ABNORMAL HIGH (ref 70–99)
Potassium: 4.6 mmol/L (ref 3.5–5.1)
Sodium: 136 mmol/L (ref 135–145)

## 2019-12-02 LAB — CBC WITH DIFFERENTIAL/PLATELET
Abs Immature Granulocytes: 0.06 10*3/uL (ref 0.00–0.07)
Basophils Absolute: 0 10*3/uL (ref 0.0–0.1)
Basophils Relative: 0 %
Eosinophils Absolute: 0.1 10*3/uL (ref 0.0–0.5)
Eosinophils Relative: 1 %
HCT: 43.5 % (ref 39.0–52.0)
Hemoglobin: 14.7 g/dL (ref 13.0–17.0)
Immature Granulocytes: 0 %
Lymphocytes Relative: 11 %
Lymphs Abs: 1.6 10*3/uL (ref 0.7–4.0)
MCH: 31.1 pg (ref 26.0–34.0)
MCHC: 33.8 g/dL (ref 30.0–36.0)
MCV: 92.2 fL (ref 80.0–100.0)
Monocytes Absolute: 2 10*3/uL — ABNORMAL HIGH (ref 0.1–1.0)
Monocytes Relative: 14 %
Neutro Abs: 10.8 10*3/uL — ABNORMAL HIGH (ref 1.7–7.7)
Neutrophils Relative %: 74 %
Platelets: 223 10*3/uL (ref 150–400)
RBC: 4.72 MIL/uL (ref 4.22–5.81)
RDW: 13.2 % (ref 11.5–15.5)
WBC: 14.6 10*3/uL — ABNORMAL HIGH (ref 4.0–10.5)
nRBC: 0 % (ref 0.0–0.2)

## 2019-12-02 LAB — HEMOGLOBIN A1C
Hgb A1c MFr Bld: 9.6 % — ABNORMAL HIGH (ref 4.8–5.6)
Mean Plasma Glucose: 228.82 mg/dL

## 2019-12-02 LAB — GLUCOSE, CAPILLARY
Glucose-Capillary: 387 mg/dL — ABNORMAL HIGH (ref 70–99)
Glucose-Capillary: 372 mg/dL — ABNORMAL HIGH (ref 70–99)
Glucose-Capillary: 266 mg/dL — ABNORMAL HIGH (ref 70–99)
Glucose-Capillary: 181 mg/dL — ABNORMAL HIGH (ref 70–99)

## 2019-12-02 LAB — SARS CORONAVIRUS 2 (TAT 6-24 HRS): SARS Coronavirus 2: NEGATIVE

## 2019-12-02 LAB — CREATININE, URINE, RANDOM: Creatinine, Urine: 75.44 mg/dL

## 2019-12-02 LAB — SODIUM, URINE, RANDOM: Sodium, Ur: 28 mmol/L

## 2019-12-02 MED ORDER — ENOXAPARIN SODIUM 30 MG/0.3ML ~~LOC~~ SOLN
30.0000 mg | SUBCUTANEOUS | Status: DC
  Administered 2019-12-03: 30 mg via SUBCUTANEOUS
  Filled 2019-12-02: qty 0.3

## 2019-12-02 MED ORDER — ACETAMINOPHEN 650 MG RE SUPP: 650.0000 mg | Freq: Four times a day (QID) | RECTAL | Status: DC | PRN

## 2019-12-02 MED ORDER — SODIUM CHLORIDE 0.9 % IV SOLN: INTRAVENOUS | Status: DC

## 2019-12-02 MED ORDER — ONDANSETRON HCL 4 MG/2ML IJ SOLN
4.0000 mg | Freq: Once | INTRAMUSCULAR | Status: AC
  Administered 2019-12-02: 4 mg via INTRAVENOUS
  Filled 2019-12-02: qty 2

## 2019-12-02 MED ORDER — TICAGRELOR 90 MG PO TABS
90.0000 mg | ORAL_TABLET | Freq: Two times a day (BID) | ORAL | Status: DC
  Administered 2019-12-03: 90 mg via ORAL
  Filled 2019-12-02: qty 1

## 2019-12-02 MED ORDER — TAMSULOSIN HCL 0.4 MG PO CAPS
0.4000 mg | ORAL_CAPSULE | Freq: Every day | ORAL | Status: DC
  Administered 2019-12-02 – 2019-12-03 (×2): 0.4 mg via ORAL
  Filled 2019-12-02 (×2): qty 1

## 2019-12-02 MED ORDER — GABAPENTIN 400 MG PO CAPS
800.0000 mg | ORAL_CAPSULE | Freq: Four times a day (QID) | ORAL | Status: DC
  Administered 2019-12-02 – 2019-12-03 (×6): 800 mg via ORAL
  Filled 2019-12-02 (×6): qty 2

## 2019-12-02 MED ORDER — CHLORHEXIDINE GLUCONATE CLOTH 2 % EX PADS
6.0000 | MEDICATED_PAD | Freq: Every day | CUTANEOUS | Status: DC
  Administered 2019-12-02 – 2019-12-03 (×2): 6 via TOPICAL

## 2019-12-02 MED ORDER — CLOPIDOGREL BISULFATE 75 MG PO TABS
75.0000 mg | ORAL_TABLET | Freq: Every day | ORAL | Status: DC
  Administered 2019-12-02: 75 mg via ORAL
  Filled 2019-12-02: qty 1

## 2019-12-02 MED ORDER — NITROGLYCERIN 0.4 MG SL SUBL: 0.4000 mg | SUBLINGUAL_TABLET | SUBLINGUAL | Status: DC | PRN

## 2019-12-02 MED ORDER — INSULIN ASPART 100 UNIT/ML ~~LOC~~ SOLN
3.0000 [IU] | Freq: Three times a day (TID) | SUBCUTANEOUS | Status: DC
  Administered 2019-12-02 – 2019-12-03 (×4): 3 [IU] via SUBCUTANEOUS

## 2019-12-02 MED ORDER — ONDANSETRON HCL 4 MG/2ML IJ SOLN: 4.0000 mg | Freq: Four times a day (QID) | INTRAMUSCULAR | Status: DC | PRN

## 2019-12-02 MED ORDER — PREGABALIN 50 MG PO CAPS
100.0000 mg | ORAL_CAPSULE | Freq: Two times a day (BID) | ORAL | Status: DC
  Administered 2019-12-02 – 2019-12-03 (×3): 100 mg via ORAL
  Filled 2019-12-02 (×3): qty 2

## 2019-12-02 MED ORDER — DARIFENACIN HYDROBROMIDE ER 7.5 MG PO TB24
7.5000 mg | ORAL_TABLET | Freq: Every day | ORAL | Status: DC
  Administered 2019-12-02: 7.5 mg via ORAL
  Filled 2019-12-02: qty 1

## 2019-12-02 MED ORDER — MORPHINE SULFATE (PF) 2 MG/ML IV SOLN: 2.0000 mg | INTRAVENOUS | Status: DC | PRN

## 2019-12-02 MED ORDER — ASPIRIN EC 81 MG PO TBEC
81.0000 mg | DELAYED_RELEASE_TABLET | Freq: Every day | ORAL | Status: DC
  Administered 2019-12-02 – 2019-12-03 (×2): 81 mg via ORAL
  Filled 2019-12-02 (×2): qty 1

## 2019-12-02 MED ORDER — INSULIN ASPART 100 UNIT/ML ~~LOC~~ SOLN: 0.0000 [IU] | Freq: Every day | SUBCUTANEOUS | Status: DC

## 2019-12-02 MED ORDER — CARVEDILOL 3.125 MG PO TABS
3.1250 mg | ORAL_TABLET | Freq: Two times a day (BID) | ORAL | Status: DC
  Administered 2019-12-02 – 2019-12-03 (×4): 3.125 mg via ORAL
  Filled 2019-12-02 (×4): qty 1

## 2019-12-02 MED ORDER — PANTOPRAZOLE SODIUM 40 MG IV SOLR
40.0000 mg | Freq: Once | INTRAVENOUS | Status: AC
  Administered 2019-12-02: 40 mg via INTRAVENOUS
  Filled 2019-12-02: qty 40

## 2019-12-02 MED ORDER — FINASTERIDE 5 MG PO TABS
5.0000 mg | ORAL_TABLET | Freq: Every day | ORAL | Status: DC
  Administered 2019-12-02 – 2019-12-03 (×2): 5 mg via ORAL
  Filled 2019-12-02 (×2): qty 1

## 2019-12-02 MED ORDER — ATORVASTATIN CALCIUM 80 MG PO TABS
80.0000 mg | ORAL_TABLET | Freq: Every day | ORAL | Status: DC
  Administered 2019-12-02: 80 mg via ORAL
  Filled 2019-12-02: qty 1

## 2019-12-02 MED ORDER — HYDROMORPHONE HCL 1 MG/ML IJ SOLN
1.0000 mg | Freq: Once | INTRAMUSCULAR | Status: AC
  Administered 2019-12-02: 04:00:00 1 mg via INTRAVENOUS
  Filled 2019-12-02: qty 1

## 2019-12-02 MED ORDER — ENOXAPARIN SODIUM 40 MG/0.4ML ~~LOC~~ SOLN
40.0000 mg | SUBCUTANEOUS | Status: DC
  Administered 2019-12-02: 40 mg via SUBCUTANEOUS
  Filled 2019-12-02: qty 0.4

## 2019-12-02 MED ORDER — ONDANSETRON HCL 4 MG PO TABS: 4.0000 mg | ORAL_TABLET | Freq: Four times a day (QID) | ORAL | Status: DC | PRN

## 2019-12-02 MED ORDER — INSULIN ASPART 100 UNIT/ML ~~LOC~~ SOLN
0.0000 [IU] | Freq: Three times a day (TID) | SUBCUTANEOUS | Status: DC
  Administered 2019-12-02 (×2): 9 [IU] via SUBCUTANEOUS
  Administered 2019-12-02: 5 [IU] via SUBCUTANEOUS
  Administered 2019-12-03: 3 [IU] via SUBCUTANEOUS
  Administered 2019-12-03: 5 [IU] via SUBCUTANEOUS
  Administered 2019-12-03: 2 [IU] via SUBCUTANEOUS

## 2019-12-02 MED ORDER — ACETAMINOPHEN 325 MG PO TABS
650.0000 mg | ORAL_TABLET | Freq: Four times a day (QID) | ORAL | Status: DC | PRN
  Filled 2019-12-02: qty 2

## 2019-12-02 NOTE — ED Provider Notes (Signed)
Attestation: Medical screening examination/treatment/procedure(s) were conducted as a shared visit with non-physician practitioner(s) and myself.  I personally evaluated the patient during the encounter.   Briefly, the patient is a 69 y.o. male with h/o BPH, coronary artery disease, GERD, hyperlipidemia, obesity presents for evaluation of acute onset, progressively worsening abdominal pain for 3 days. Reports no BM for 3 days.  Vitals:   12/02/19 0520 12/02/19 0600  BP: 138/81 (!) 143/80  Pulse: 82 80  Resp: 16 20  Temp:    SpO2: 95% 97%    CONSTITUTIONAL:  nontoxic-appearing, NAD NEURO:  Alert and oriented x 3, no focal deficits EYES:  pupils equal and reactive ENT/NECK:  trachea midline, no JVD CARDIO:  reg rate, reg rhythm, well-perfused PULM:  None labored breathing GI/GU:  Abdomen distended, and diffusely TTP MSK/SPINE:  No gross deformities, no edema SKIN:  no rash, atraumatic PSYCH:  Appropriate speech and behavior   EKG Interpretation  Date/Time:    Ventricular Rate:    PR Interval:    QRS Duration:   QT Interval:    QTC Calculation:   R Axis:     Text Interpretation:         Work up notable for hyperglycemia, AKI, and possible duodenitis vs pancreatitis. Admitted for further work up and management.      Nira Conn, MD 12/02/19 [phone]

## 2019-12-02 NOTE — H&P (Signed)
History and Physical    Arthur Smith JME:[id number] DOB: 14-Jul-1951 DOA: 12/01/2019  PCP: Lavone Orn, MD   Patient coming from: Home   Chief Complaint: Lower abdominal pain   HPI: Arthur Smith is a 69 y.o. male with medical history significant for CAD with stents, peripheral neuropathy, and arthritis, now presenting to emergency department with several days of lower abdominal pain.  Patient reports that he has had difficulty urinating and has seen a urologist for this.  He did not have any abdominal pain however until approximately 3 days ago when he developed aching discomfort in the lower abdomen/suprapubic region.  He has had some nausea associated with this but no vomiting or diarrhea.  He denies melena or hematochezia.  He denies fevers, chills, cough, shortness of breath, or chest pain.  ED Course: Upon arrival to the ED, patient is found to be afebrile, saturating well on room air, and with stable blood pressure.  Chemistry panel notable for creatinine of 2.57, up from 1 a year ago.  CBC is notable for leukocytosis to 16,600.  CT of the abdomen and pelvis with inflammatory stranding about the duodenum, no hydronephrosis, distended urinary bladder, and enlarged prostate.  Patient was treated with Dilaudid, Zofran, and Protonix in the ED.  Review of Systems:  All other systems reviewed and apart from HPI, are negative.  Past Medical History:  Diagnosis Date  . Arthritis   . BPH without obstruction/lower urinary tract symptoms   . Bronchitis with asthma, acute April 2013  . CAD S/P percutaneous coronary angioplasty 11/15/2018   Severe 2 vessel obstructive CAD:. Ost-pCx 95% (DES PCI  STENT SYNERGY DES 3.5X28) &p-mCx 100% (DES PCI STENT SYNERGY DES 2.25X32). pRCA 90%, mRCA 85% & rPDA 90%.dLAD ~40%.  EF 55 to 60%.  Moderately elevated LVEDP.  No R WMA.  Plan staged PCI to 3 RCA-PDA lesions 4/1  . Depression    denies  . Frequency of urination    URINATION AT NIGHT, PAINFUL SLOW  STREAM   . GERD (gastroesophageal reflux disease)    occ  . Hyperlipidemia   . Neuromuscular disorder ([place])    DIZZINESS   PERIPHERAL NEUROPATHY   . Neuropathy   . No pertinent past medical history    RINGING RIGHT EAR  . NSTEMI (non-ST elevated myocardial infarction) ([place]) 11/13/2018  . Recurrent upper respiratory infection (URI)    SOB CURRENT COLD  . Shortness of breath    d/t "cold" 12/02/11    Past Surgical History:  Procedure Laterality Date  . CORONARY STENT INTERVENTION N/A 11/15/2018   Procedure: CORONARY STENT INTERVENTION;  Surgeon: Martinique, Peter M, MD;  Location: [facility];;  Ost-pCx 95% (DES PCI  STENT SYNERGY DES 3.5X28) &p-mCx 100% (DES PCI STENT SYNERGY DES 2.25X32)  . CORONARY STENT INTERVENTION N/A 11/16/2018   Procedure: CORONARY STENT INTERVENTION;  Surgeon: Belva Crome, MD;  Location: [facility];  Service: Cardiovascular;  Laterality: N/A;  . FINGER SURGERY     RIGHT MIDDLE FINGER  . HERNIA REPAIR     UMBILICAL HERNIA [id number]  . LEFT HEART CATH AND CORONARY ANGIOGRAPHY N/A 11/15/2018   Procedure: LEFT HEART CATH AND CORONARY ANGIOGRAPHY;  Surgeon: Martinique, Peter M, MD;  Location: [place];; Severe 2 vessel obstructive CAD:. Ost-pCx 95% (DES PCI) &p-mCx 100% (DES PCI). pRCA 90%, mRCA 85% & rPDA 90%.dLAD ~40%.  EF 55 to 60%.  Moderately elevated LVEDP.  No R WMA.  -- Plan staged PCI  to 3 RCA-PDA lesions 4/1  . LUMBAR LAMINECTOMY/DECOMPRESSION MICRODISCECTOMY  12/22/2011   Procedure: LUMBAR LAMINECTOMY/DECOMPRESSION MICRODISCECTOMY 1 LEVEL;  Surgeon: Maeola Harman, MD;  Location: [facility];  Service: Neurosurgery;  Laterality: N/A;  Thoracic ten-eleven laminectomy  . LUMBAR LAMINECTOMY/DECOMPRESSION MICRODISCECTOMY Right 05/01/2014   Procedure: Right Lumbar three-four Microdiskectomy;  Surgeon: Maeola Harman, MD;  Location: [facility];  Service: Neurosurgery;  Laterality: Right;  . LUMBAR LAMINECTOMY/DECOMPRESSION MICRODISCECTOMY Right 11/08/2014     Procedure: Right L4-5 L5-S1 Laminectomy;  Surgeon: Maeola Harman, MD;  Location: [facility];  Service: Neurosurgery;  Laterality: Right;  Right L4-5 L5-S1 Laminectomy  . MULTIPLE TOOTH EXTRACTIONS       reports that he quit smoking about 35 years ago. His smoking use included cigarettes. He has a 30.00 pack-year smoking history. He has never used smokeless tobacco. He reports current alcohol use. He reports that he does not use drugs.  Allergies  Allergen Reactions  . Eggs Or Egg-Derived Products Other (See Comments)    Egg yolks cause stomach cramps and diarrhea  . Lactose Intolerance (Gi) Other (See Comments)    Causes stomach cramps and diarrhea    Family History  Problem Relation Age of Onset  . Cancer Mother   . Stroke Father   . Neuropathy Brother      Prior to Admission medications   Medication Sig Start Date End Date Taking? Authorizing Provider  aspirin EC 81 MG tablet Take 1 tablet (81 mg total) by mouth See admin instructions. 11/17/18  Yes Arty Baumgartner, NP  atorvastatin (LIPITOR) 80 MG tablet Take 1 tablet (80 mg total) by mouth at bedtime. 02/08/19  Yes Croitoru, Mihai, MD  carvedilol (COREG) 3.125 MG tablet TAKE 1 TABLET BY MOUTH TWICE A DAY WITH A MEAL Patient taking differently: Take 3.125 mg by mouth 2 (two) times daily with a meal.  07/18/19  Yes Croitoru, Mihai, MD  cholecalciferol (VITAMIN D3) 25 MCG (1000 UT) tablet Take 1,000 Units by mouth daily at 6 PM.   Yes [provider]  clopidogrel (PLAVIX) 75 MG tablet Take 1 tablet (75 mg total) by mouth daily. 11/27/19  Yes Azalee Course, PA  Cyanocobalamin 2500 MCG TABS Take 2,500 mcg by mouth daily at 6 PM. Vitamin B12   Yes [provider]  finasteride (PROSCAR) 5 MG tablet Take 1 tablet (5 mg total) by mouth daily. 11/17/18   Arty Baumgartner, NP  gabapentin (NEURONTIN) 100 MG capsule TAKE 1 CAPSULE BY MOUTH 4 TIMES DAILY (TAKE WITH 800 MG TABLETS) 09/28/19   Glean Salvo, NP  gabapentin  (NEURONTIN) 800 MG tablet Take 1 tablet (800 mg total) by mouth 4 (four) times daily. Take with 100mg  caps 09/28/19   11/26/19, NP  imipramine (TOFRANIL-PM) 150 MG capsule Take 1 capsule (150 mg total) by mouth at bedtime. 06/13/19   06/15/19, MD  nitroGLYCERIN (NITROSTAT) 0.4 MG SL tablet Place 1 tablet (0.4 mg total) under the tongue every 5 (five) minutes as needed for chest pain. DON'T take within 24 hrs of Viagra 11/17/18   01/17/19, NP  Omega-3 Fatty Acids (FISH OIL) 1000 MG CAPS Take 1,000 mg by mouth daily at 6 PM.    [provider]  Phenazopyridine HCl (AZO URINARY PAIN RELIEF PO) Take by mouth.    [provider]  pregabalin (LYRICA) 100 MG capsule TAKE 1 CAPSULE BY MOUTH TWICE A DAY 09/18/19   11/16/19, MD  senna-docusate [facility]  S) 8.6-50 MG per tablet Take 2 tablets by mouth at bedtime as needed (constipation).     [provider]  sildenafil (VIAGRA) 50 MG tablet Take 50 mg by mouth daily as needed for erectile dysfunction.    [provider]  solifenacin (VESICARE) 10 MG tablet Take 5 mg by mouth daily.    [provider]  ticagrelor (BRILINTA) 90 MG TABS tablet Take 1 tablet (90 mg total) by mouth 2 (two) times daily. 02/08/19   Croitoru, Rachelle Hora, MD  zonisamide (ZONEGRAN) 100 MG capsule TAKE 3 CAPSULES BY MOUTH ONCE DAILY AT BEDTIME Patient taking differently: Take 300 mg by mouth daily at 6 PM.  09/02/18   Nilda Riggs, NP    Physical Exam: Vitals:   12/01/19 2001 12/02/19 0345 12/02/19 0520 12/02/19 0600  BP:  (!) 147/85 138/81 (!) 143/80  Pulse:  73 82 80  Resp:  17 16 20   Temp:      TempSrc:      SpO2:  99% 95% 97%  Weight: 99.8 kg     Height: 5\' 8"  (1.727 m)       Constitutional: NAD, calm  Eyes: PERTLA, lids and conjunctivae normal ENMT: Mucous membranes are moist. Posterior pharynx clear of any exudate or lesions.   Neck: normal, supple, no masses, no thyromegaly Respiratory: no  wheezing, no crackles. No accessory muscle use.  Cardiovascular: S1 & S2 heard, regular rate and rhythm. No extremity edema.   Abdomen: Lower abdominal tenderness, soft, no rebound pain or guarding. Bowel sounds active.  Musculoskeletal: no clubbing / cyanosis. No joint deformity upper and lower extremities.   Skin: no significant rashes, lesions, ulcers. Warm, dry, well-perfused. Neurologic: No facial asymmetry. Sensation intact. Moving all extremities.  Psychiatric: Alert and oriented to person, place, and situation. Very pleasant and cooperative.    Labs and Imaging on Admission: I have personally reviewed following labs and imaging studies  CBC: Recent Labs  Lab 12/01/19 2006  WBC 16.6*  HGB 15.3  HCT 46.0  MCV 92.6  PLT 267   Basic Metabolic Panel: Recent Labs  Lab 12/01/19 2006  NA 133*  K 5.0  CL 99  CO2 23  GLUCOSE 490*  BUN 20  CREATININE 2.57*  CALCIUM 9.6   GFR: Estimated Creatinine Clearance: 31.5 mL/min (A) (by C-G formula based on SCr of 2.57 mg/dL (H)). Liver Function Tests: Recent Labs  Lab 12/01/19 2006  AST 20  ALT 52*  ALKPHOS 128*  BILITOT 1.7*  PROT 7.2  ALBUMIN 4.2   Recent Labs  Lab 12/01/19 2006  LIPASE 18   No results for input(s): AMMONIA in the last 168 hours. Coagulation Profile: No results for input(s): INR, PROTIME in the last 168 hours. Cardiac Enzymes: No results for input(s): CKTOTAL, CKMB, CKMBINDEX, TROPONINI in the last 168 hours. BNP (last 3 results) No results for input(s): PROBNP in the last 8760 hours. HbA1C: No results for input(s): HGBA1C in the last 72 hours. CBG: No results for input(s): GLUCAP in the last 168 hours. Lipid Profile: No results for input(s): CHOL, HDL, LDLCALC, TRIG, CHOLHDL, LDLDIRECT in the last 72 hours. Thyroid Function Tests: No results for input(s): TSH, T4TOTAL, FREET4, T3FREE, THYROIDAB in the last 72 hours. Anemia Panel: No results for input(s): VITAMINB12, FOLATE, FERRITIN, TIBC,  IRON, RETICCTPCT in the last 72 hours. Urine analysis:    Component Value Date/Time   COLORURINE YELLOW 12/01/2019 2003   APPEARANCEUR HAZY (A) 12/01/2019 2003   LABSPEC 1.024 12/01/2019 2003  PHURINE 6.0 12/01/2019 2003   GLUCOSEU >=500 (A) 12/01/2019 2003   HGBUR NEGATIVE 12/01/2019 2003   BILIRUBINUR NEGATIVE 12/01/2019 2003   KETONESUR NEGATIVE 12/01/2019 2003   PROTEINUR NEGATIVE 12/01/2019 2003   UROBILINOGEN 1.0 04/15/2014 1534   NITRITE NEGATIVE 12/01/2019 2003   LEUKOCYTESUR NEGATIVE 12/01/2019 2003   Sepsis Labs: @LABRCNTIP (procalcitonin:4,lacticidven:4) )No results found for this or any previous visit (from the past 240 hour(s)).   Radiological Exams on Admission: CT ABDOMEN PELVIS WO CONTRAST  Result Date: 12/02/2019 CLINICAL DATA:  Abdominal distension EXAM: CT ABDOMEN AND PELVIS WITHOUT CONTRAST TECHNIQUE: Multidetector CT imaging of the abdomen and pelvis was performed following the standard protocol without IV contrast. COMPARISON:  09/23/2011 FINDINGS: LOWER CHEST: Normal. HEPATOBILIARY: Normal hepatic contours. No intra- or extrahepatic biliary dilatation. Small amount of perihepatic free fluid. Gallbladder is normal. PANCREAS: Fatty atrophy of the pancreas. SPLEEN: Normal. ADRENALS/URINARY TRACT: The adrenal glands are normal. No hydronephrosis, nephroureterolithiasis or solid renal mass. Distended urinary bladder. STOMACH/BOWEL: There is inflammatory stranding adjacent to the second and third portion of the duodenum. There is fluid tracking into the right greater than left anterior pararenal space. No small bowel dilatation or inflammation. No focal colonic abnormality. Normal appendix. VASCULAR/LYMPHATIC: There is calcific atherosclerosis of the abdominal aorta. No abdominal or pelvic lymphadenopathy. REPRODUCTIVE: Enlarged prostate measures 6.2 cm in transverse dimension. MUSCULOSKELETAL. Multilevel degenerative disc disease and facet arthrosis. No bony spinal canal  stenosis. OTHER: None. IMPRESSION: 1. Inflammatory stranding adjacent to the second and third portion of the duodenum with fluid tracking into the right greater than left anterior pararenal space. This may indicate duodenitis, but may also be secondary to duodenal ulcer. Pancreatitis is considered less likely. 2. Distended urinary bladder. 3. Enlarged prostate. 4.  Aortic atherosclerosis (ICD10-I70.0). Electronically Signed   By: Deatra RobinsonKevin  Herman M.D.   On: 12/02/2019 05:40   DG Abdomen Acute W/Chest  Result Date: 12/02/2019 CLINICAL DATA:  Abdominal pain EXAM: DG ABDOMEN ACUTE W/ 1V CHEST COMPARISON:  None. FINDINGS: There is no evidence of dilated bowel loops or free intraperitoneal air. No radiopaque calculi or other significant radiographic abnormality is seen. Heart size and mediastinal contours are within normal limits. Both lungs are clear. IMPRESSION: Negative abdominal radiographs.  No acute cardiopulmonary disease. Electronically Signed   By: Deatra RobinsonKevin  Herman M.D.   On: 12/02/2019 05:32    Assessment/Plan   1. Acute urinary retention; renal insufficiency  - Presents with several days of lower abdominal pain, has had difficulty urinating, and is found to have SCr of 2.57 (up from 1 a yr ago) and CT abd/pelvis with distended urinary bladder and enlarged prostate  - Place Foley, check urine chemistries, renally-dose medications, avoid nephrotoxins, start IVF hydration, repeat chem panel    2. Duodenitis  - Duodenitis noted on CT abd/pelvis, possibly PUD though patient's abdominal tenderness is suprapubic and likely related to his AUR  - He was started on PPI in ED, will continue daily PPI, check H pylori    3. CAD  - No anginal complaints  - Plan to continue home medications pending pharmacy medication reconciliation    4. Hyperglycemia  - Serum glucose is 490 in ED  - No hx of DM, no A1c on file  - Check A1c, check CBGs and start SSI with Novolog    DVT prophylaxis: Lovenox  Code  Status: Full  Family Communication: Wife updated by phone  Disposition Plan:  Patient is from: Home  Anticipated d/c is to: Home  Anticipated d/c date  is: 4/18 or 4/19  Patient currently: Requiring treatment for acute urinary retention with AKI Consults called: none  Admission status: Observation     Briscoe Deutscher, MD [facility] Pager: See [email or web address]  If 7AM-7PM, please contact the daytime attending [email or web address]  12/02/2019, 6:37 AM

## 2019-12-02 NOTE — ED Provider Notes (Signed)
[facility] EMERGENCY DEPARTMENT Provider Note   CSN: [id number] Arrival date & time: 12/01/19  1903     History Chief Complaint  Patient presents with  . Abdominal Pain    Arthur Smith is a 69 y.o. male with history of BPH, coronary artery disease, GERD, hyperlipidemia, obesity presents for evaluation of acute onset, progressively worsening abdominal pain for 3 days.  He reports severe sharp pain worse along the lower abdomen but radiates all over.  It worsens with ambulation, laying flat.  He notes associated constipation and has not been able to have a bowel movement in about 2 days.  He has been passing gas.  He feels that his abdomen is distended.  He has had nausea but no vomiting.  He denies melena or hematochezia.  He denies urinary symptoms but states he has a history of overactive bladder which is managed by urology.  Denies fevers, chills, chest pain or shortness of breath.  Has not tried anything for his symptoms.  Has a history of prior hernia repair in 2011.  The history is provided by the patient.       Past Medical History:  Diagnosis Date  . Arthritis   . BPH without obstruction/lower urinary tract symptoms   . Bronchitis with asthma, acute April 2013  . CAD S/P percutaneous coronary angioplasty 11/15/2018   Severe 2 vessel obstructive CAD:. Ost-pCx 95% (DES PCI  STENT SYNERGY DES 3.5X28) &p-mCx 100% (DES PCI STENT SYNERGY DES 2.25X32). pRCA 90%, mRCA 85% & rPDA 90%.dLAD ~40%.  EF 55 to 60%.  Moderately elevated LVEDP.  No R WMA.  Plan staged PCI to 3 RCA-PDA lesions 4/1  . Depression    denies  . Frequency of urination    URINATION AT NIGHT, PAINFUL SLOW STREAM   . GERD (gastroesophageal reflux disease)    occ  . Hyperlipidemia   . Neuromuscular disorder (HCC)    DIZZINESS   PERIPHERAL NEUROPATHY   . Neuropathy   . No pertinent past medical history    RINGING RIGHT EAR  . NSTEMI (non-ST elevated myocardial infarction) (HCC) 11/13/2018  .  Recurrent upper respiratory infection (URI)    SOB CURRENT COLD  . Shortness of breath    d/t "cold" 12/02/11    Patient Active Problem List   Diagnosis Date Noted  . Duodenitis 12/02/2019  . Renal insufficiency 12/02/2019  . BPH with obstruction/lower urinary tract symptoms 11/18/2018  . Obesity 11/18/2018  . Hyperlipidemia LDL goal <70 11/15/2018  . CAD S/P percutaneous coronary angioplasty 11/15/2018  . Acute urinary retention 11/15/2018  . Hyperglycemia 11/15/2018  . NSTEMI (non-ST elevated myocardial infarction) (HCC) 11/14/2018  . Back pain 09/02/2018  . Lumbar stenosis with neurogenic claudication 11/08/2014  . Herniated lumbar intervertebral disc 05/01/2014  . Hereditary and idiopathic peripheral neuropathy 05/02/2013  . Thoracic root lesions, not elsewhere classified 05/02/2013    Past Surgical History:  Procedure Laterality Date  . CORONARY STENT INTERVENTION N/A 11/15/2018   Procedure: CORONARY STENT INTERVENTION;  Surgeon: Swaziland, Peter M, MD;  Location: [facility] INVASIVE CV LAB;;  Ost-pCx 95% (DES PCI  STENT SYNERGY DES 3.5X28) &p-mCx 100% (DES PCI STENT SYNERGY DES 2.25X32)  . CORONARY STENT INTERVENTION N/A 11/16/2018   Procedure: CORONARY STENT INTERVENTION;  Surgeon: Lyn Records, MD;  Location: [facility] INVASIVE CV LAB;  Service: Cardiovascular;  Laterality: N/A;  . FINGER SURGERY     RIGHT MIDDLE FINGER  . HERNIA REPAIR     UMBILICAL HERNIA 2011  .  LEFT HEART CATH AND CORONARY ANGIOGRAPHY N/A 11/15/2018   Procedure: LEFT HEART CATH AND CORONARY ANGIOGRAPHY;  Surgeon: SwazilandJordan, Peter M, MD;  [facility];; Severe 2 vessel obstructive CAD:. Ost-pCx 95% (DES PCI) &p-mCx 100% (DES PCI). pRCA 90%, mRCA 85% & rPDA 90%.dLAD ~40%.  EF 55 to 60%.  Moderately elevated LVEDP.  No R WMA.  -- Plan staged PCI to 3 RCA-PDA lesions 4/1  . LUMBAR LAMINECTOMY/DECOMPRESSION MICRODISCECTOMY  12/22/2011   Procedure: LUMBAR LAMINECTOMY/DECOMPRESSION MICRODISCECTOMY 1 LEVEL;  Surgeon:  Maeola HarmanJoseph Stern, MD;  Location: [facility] NEURO ORS;  Service: Neurosurgery;  Laterality: N/A;  Thoracic ten-eleven laminectomy  . LUMBAR LAMINECTOMY/DECOMPRESSION MICRODISCECTOMY Right 05/01/2014   Procedure: Right Lumbar three-four Microdiskectomy;  Surgeon: Maeola HarmanJoseph Stern, MD;  Location: [facility] NEURO ORS;  Service: Neurosurgery;  Laterality: Right;  . LUMBAR LAMINECTOMY/DECOMPRESSION MICRODISCECTOMY Right 11/08/2014   Procedure: Right L4-5 L5-S1 Laminectomy;  Surgeon: Maeola HarmanJoseph Stern, MD;  Location: [facility] NEURO ORS;  Service: Neurosurgery;  Laterality: Right;  Right L4-5 L5-S1 Laminectomy  . MULTIPLE TOOTH EXTRACTIONS         Family History  Problem Relation Age of Onset  . Cancer Mother   . Stroke Father   . Neuropathy Brother     Social History   Tobacco Use  . Smoking status: Former Smoker    Packs/day: 2.00    Years: 15.00    Pack years: 30.00    Types: Cigarettes    Quit date: 04/24/1984    Years since quitting: 35.6  . Smokeless tobacco: Never Used  Substance Use Topics  . Alcohol use: Yes    Comment: occ  . Drug use: No    Home Medications Prior to Admission medications   Medication Sig Start Date End Date Taking? Authorizing Provider  aspirin EC 81 MG tablet Take 1 tablet (81 mg total) by mouth See admin instructions. 11/17/18  Yes Arty Baumgartneroberts, Lindsay B, NP  atorvastatin (LIPITOR) 80 MG tablet Take 1 tablet (80 mg total) by mouth at bedtime. 02/08/19  Yes Croitoru, Mihai, MD  carvedilol (COREG) 3.125 MG tablet TAKE 1 TABLET BY MOUTH TWICE A DAY WITH A MEAL Patient taking differently: Take 3.125 mg by mouth 2 (two) times daily with a meal.  07/18/19  Yes Croitoru, Mihai, MD  cholecalciferol (VITAMIN D3) 25 MCG (1000 UT) tablet Take 1,000 Units by mouth daily at 6 PM.   Yes [provider]  clopidogrel (PLAVIX) 75 MG tablet Take 1 tablet (75 mg total) by mouth daily. 11/27/19  Yes Azalee CourseMeng, Hao, PA  Cyanocobalamin 2500 MCG TABS Take 2,500 mcg by mouth daily at 6 PM. Vitamin B12   Yes [provider]  finasteride (PROSCAR) 5 MG tablet Take 1 tablet (5 mg total) by mouth daily. 11/17/18   Arty Baumgartneroberts, Lindsay B, NP  gabapentin (NEURONTIN) 100 MG capsule TAKE 1 CAPSULE BY MOUTH 4 TIMES DAILY (TAKE WITH 800 MG TABLETS) 09/28/19   Glean SalvoSlack, Sarah J, NP  gabapentin (NEURONTIN) 800 MG tablet Take 1 tablet (800 mg total) by mouth 4 (four) times daily. Take with 100mg  caps 09/28/19   Glean SalvoSlack, Sarah J, NP  imipramine (TOFRANIL-PM) 150 MG capsule Take 1 capsule (150 mg total) by mouth at bedtime. 06/13/19   York SpanielWillis, Charles K, MD  nitroGLYCERIN (NITROSTAT) 0.4 MG SL tablet Place 1 tablet (0.4 mg total) under the tongue every 5 (five) minutes as needed for chest pain. DON'T take within 24 hrs of Viagra 11/17/18   Arty Baumgartneroberts, Lindsay B, NP  Omega-3 Fatty Acids (  FISH OIL) 1000 MG CAPS Take 1,000 mg by mouth daily at 6 PM.    [provider]  Phenazopyridine HCl (AZO URINARY PAIN RELIEF PO) Take by mouth.    [provider]  pregabalin (LYRICA) 100 MG capsule TAKE 1 CAPSULE BY MOUTH TWICE A DAY 09/18/19   York Spaniel, MD  senna-docusate (SENNA S) 8.6-50 MG per tablet Take 2 tablets by mouth at bedtime as needed (constipation).     [provider]  sildenafil (VIAGRA) 50 MG tablet Take 50 mg by mouth daily as needed for erectile dysfunction.    [provider]  solifenacin (VESICARE) 10 MG tablet Take 5 mg by mouth daily.    [provider]  ticagrelor (BRILINTA) 90 MG TABS tablet Take 1 tablet (90 mg total) by mouth 2 (two) times daily. 02/08/19   Croitoru, Mihai, MD  zonisamide (ZONEGRAN) 100 MG capsule TAKE 3 CAPSULES BY MOUTH ONCE DAILY AT BEDTIME Patient taking differently: Take 300 mg by mouth daily at 6 PM.  09/02/18   Nilda Riggs, NP    Allergies    Eggs or egg-derived products and Lactose intolerance (gi)  Review of Systems   Review of Systems  Constitutional: Negative for chills and fever.  Respiratory: Negative for shortness of breath.     Cardiovascular: Negative for chest pain.  Gastrointestinal: Positive for abdominal distention, abdominal pain, constipation and nausea. Negative for vomiting.  Genitourinary: Negative for dysuria, hematuria and urgency.  All other systems reviewed and are negative.   Physical Exam Updated Vital Signs BP (!) 143/80   Pulse 80   Temp 98 F (36.7 C) (Oral)   Resp 20   Ht 5\' 8"  (1.727 m)   Wt 99.8 kg   SpO2 97%   BMI 33.45 kg/m   Physical Exam Vitals and nursing note reviewed.  Constitutional:      General: He is not in acute distress.    Appearance: He is well-developed. He is obese.  HENT:     Head: Normocephalic and atraumatic.  Eyes:     General:        Right eye: No discharge.        Left eye: No discharge.     Conjunctiva/sclera: Conjunctivae normal.  Neck:     Vascular: No JVD.     Trachea: No tracheal deviation.  Cardiovascular:     Rate and Rhythm: Normal rate and regular rhythm.  Pulmonary:     Effort: Pulmonary effort is normal.     Breath sounds: Normal breath sounds.  Abdominal:     General: Bowel sounds are decreased. There is distension.     Tenderness: There is generalized abdominal tenderness. There is right CVA tenderness and guarding. There is no left CVA tenderness or rebound.  Skin:    General: Skin is warm and dry.     Findings: No erythema.  Neurological:     Mental Status: He is alert.  Psychiatric:        Behavior: Behavior normal.     ED Results / Procedures / Treatments   Labs (all labs ordered are listed, but only abnormal results are displayed) Labs Reviewed  COMPREHENSIVE METABOLIC PANEL - Abnormal; Notable for the following components:      Result Value   Sodium 133 (*)    Glucose, Bld 490 (*)    Creatinine, Ser 2.57 (*)    ALT 52 (*)    Alkaline Phosphatase 128 (*)    Total Bilirubin 1.7 (*)  GFR calc non Af Amer 25 (*)    GFR calc Af Amer 29 (*)    All other components within normal limits  CBC - Abnormal; Notable for  the following components:   WBC 16.6 (*)    All other components within normal limits  URINALYSIS, ROUTINE W REFLEX MICROSCOPIC - Abnormal; Notable for the following components:   APPearance HAZY (*)    Glucose, UA >=500 (*)    All other components within normal limits  SARS CORONAVIRUS 2 (TAT 6-24 HRS)  LIPASE, BLOOD  H. PYLORI ANTIGEN, STOOL  HEMOGLOBIN A1C    EKG None  Radiology CT ABDOMEN PELVIS WO CONTRAST  Result Date: 12/02/2019 CLINICAL DATA:  Abdominal distension EXAM: CT ABDOMEN AND PELVIS WITHOUT CONTRAST TECHNIQUE: Multidetector CT imaging of the abdomen and pelvis was performed following the standard protocol without IV contrast. COMPARISON:  09/23/2011 FINDINGS: LOWER CHEST: Normal. HEPATOBILIARY: Normal hepatic contours. No intra- or extrahepatic biliary dilatation. Small amount of perihepatic free fluid. Gallbladder is normal. PANCREAS: Fatty atrophy of the pancreas. SPLEEN: Normal. ADRENALS/URINARY TRACT: The adrenal glands are normal. No hydronephrosis, nephroureterolithiasis or solid renal mass. Distended urinary bladder. STOMACH/BOWEL: There is inflammatory stranding adjacent to the second and third portion of the duodenum. There is fluid tracking into the right greater than left anterior pararenal space. No small bowel dilatation or inflammation. No focal colonic abnormality. Normal appendix. VASCULAR/LYMPHATIC: There is calcific atherosclerosis of the abdominal aorta. No abdominal or pelvic lymphadenopathy. REPRODUCTIVE: Enlarged prostate measures 6.2 cm in transverse dimension. MUSCULOSKELETAL. Multilevel degenerative disc disease and facet arthrosis. No bony spinal canal stenosis. OTHER: None. IMPRESSION: 1. Inflammatory stranding adjacent to the second and third portion of the duodenum with fluid tracking into the right greater than left anterior pararenal space. This may indicate duodenitis, but may also be secondary to duodenal ulcer. Pancreatitis is considered less  likely. 2. Distended urinary bladder. 3. Enlarged prostate. 4.  Aortic atherosclerosis (ICD10-I70.0). Electronically Signed   By: Ulyses Jarred M.D.   On: 12/02/2019 05:40   DG Abdomen Acute W/Chest  Result Date: 12/02/2019 CLINICAL DATA:  Abdominal pain EXAM: DG ABDOMEN ACUTE W/ 1V CHEST COMPARISON:  None. FINDINGS: There is no evidence of dilated bowel loops or free intraperitoneal air. No radiopaque calculi or other significant radiographic abnormality is seen. Heart size and mediastinal contours are within normal limits. Both lungs are clear. IMPRESSION: Negative abdominal radiographs.  No acute cardiopulmonary disease. Electronically Signed   By: Ulyses Jarred M.D.   On: 12/02/2019 05:32    Procedures .Critical Care Performed by: Renita Papa, PA-C Authorized by: Renita Papa, PA-C   Critical care provider statement:    Critical care time (minutes):  45   Critical care was necessary to treat or prevent imminent or life-threatening deterioration of the following conditions:  Renal failure   Critical care was time spent personally by me on the following activities:  Discussions with consultants, evaluation of patient's response to treatment, examination of patient, ordering and performing treatments and interventions, ordering and review of laboratory studies, ordering and review of radiographic studies, pulse oximetry, re-evaluation of patient's condition, obtaining history from patient or surrogate and review of old charts   (including critical care time)  Medications Ordered in ED Medications  0.9 %  sodium chloride infusion (has no administration in time range)  ondansetron (ZOFRAN) tablet 4 mg (has no administration in time range)    Or  ondansetron (ZOFRAN) injection 4 mg (has no administration in time range)  morphine 2 MG/ML injection 2-4 mg (has no administration in time range)  insulin aspart (novoLOG) injection 0-9 Units (has no administration in time range)  sodium  chloride flush (NS) 0.9 % injection 3 mL (3 mLs Intravenous Given 12/02/19 0346)  HYDROmorphone (DILAUDID) injection 1 mg (1 mg Intravenous Given 12/02/19 0408)  ondansetron (ZOFRAN) injection 4 mg (4 mg Intravenous Given 12/02/19 0407)  pantoprazole (PROTONIX) injection 40 mg (40 mg Intravenous Given 12/02/19 0602)    ED Course  I have reviewed the triage vital signs and the nursing notes.  Pertinent labs & imaging results that were available during my care of the patient were reviewed by me and considered in my medical decision making (see chart for details).    MDM Rules/Calculators/A&P                      Patient presenting for evaluation of 3-day history of progressively worsening abdominal pain.  He is afebrile, vital signs are stable.  He is uncomfortable but nontoxic in appearance.  Abdomen is protuberant and he exhibits some voluntary guarding.   Lab work reviewed and interpreted by myself shows leukocytosis of 16.6, elevated creatinine 2.57 (up from baseline of around 1 one year ago).  He is quite  hyperglycemic but does not appear to be in DKA with normal CO2 and normal anion gap.  He has no prior history of diabetes.  UA shows glucosuria but no evidence of UTI or nephrolithiasis.  Imaging reviewed independently by myself shows findings consistent with possible duodenitis, less likely pancreatitis in the setting of normal lipase.  However imaging was limited due to renal function, noncontrast CT scan was obtained.  Patient was given IV Dilaudid, Zofran, Protonix in the ED.  Dr. Antionette Char with [facility] hospitalist service to admit to the hospital for further evaluation and management.  Final Clinical Impression(s) / ED Diagnoses Final diagnoses:  AKI (acute kidney injury) (HCC)  Hyperglycemia  Duodenitis    Rx / DC Orders ED Discharge Orders    None       Bennye Alm 12/02/19 [id number]    Nira Conn, MD 12/03/19 [phone]

## 2019-12-02 NOTE — Progress Notes (Signed)
PROGRESS NOTE  Arthur Smith HKV:[id number] DOB: 1951-06-24 DOA: 12/01/2019 PCP: Kirby Funk, MD  HPI/Recap of past 24 hours: HPI from Dr Cordie Grice is a 69 y.o. male with medical history significant for CAD with stents, peripheral neuropathy, and arthritis, now presenting to emergency department with several days of lower abdominal pain.  Patient reports that he has had difficulty urinating and has seen a urologist for this.  He did not have any abdominal pain however until approximately 3 days ago when he developed aching discomfort in the lower abdomen/suprapubic region.  He has had some nausea associated with this but no vomiting or diarrhea. Upon arrival to the ED, patient is found to be afebrile, saturating well on room air, and with stable blood pressure.  Chemistry panel notable for creatinine of 2.57, up from 1 a year ago.  CBC is notable for leukocytosis to 16,600.  CT of the abdomen and pelvis with inflammatory stranding about the duodenum, no hydronephrosis, distended urinary bladder, and enlarged prostate.  Patient was treated with Dilaudid, Zofran, and Protonix in the ED.    Today, patient denies any new complaints, still with some lower abdominal cramping, although improved since after Foley was placed.  Patient denies any chest pain, shortness of breath, nausea/vomiting, fever/chills.   Assessment/Plan: Principal Problem:   Acute urinary retention Active Problems:   Duodenitis   Renal insufficiency   CAD S/P percutaneous coronary angioplasty   Hyperglycemia  Acute urinary retention/history of BPH History of BPH, has been followed by urology in the past, tried multiple medications, noncompliance Also patient noted to be on some medications such as Tofranil, Zonegran for his neuropathy which can cause urinary retention CT abdomen pelvis noted distended bladder and enlarged prostate, no hydronephrosis Foley inserted Discussed with urology Dr. Ronne Binning on  12/02/2019, recommend to start Flomax as well as Proscar, keep Foley in and follow-up in clinic within 1 week for voiding trial  AKI Baseline creatinine normal as of 11/2018 ??Likely due to above, obstructive uropathy, although no hydronephrosis noted Vs diabetic nephropathy IV fluids, Foley catheter Daily BMP  Likely new onset diabetes mellitus Presented with hypoglycemia, CBG in the 400s, no evidence of DKA A1c done showed 9.6 No prior history of DM or prediabetes as per patient Start SSI, NovoLog, Accu-Cheks, hypoglycemic protocol Diabetes coordinator consulted  Duodenitis CT abdomen pelvis showed duodenitis H. pylori pending Continue PPI for now  History of CAD Chest pain-free Status post 4 DES on 10/2018 Continue aspirin, Plavix  History of peripheral neuropathy Follows neurology, currently on max dose of gabapentin, Lyrica, Tofranil, Zonegran Will hold Tofranil, Zonegran for now as this may worsen urinary retention        Malnutrition Type:      Malnutrition Characteristics:      Nutrition Interventions:       Estimated body mass index is 33.45 kg/m as calculated from the following:   Height as of this encounter: 5\' 8"  (1.727 m).   Weight as of this encounter: 99.8 kg.     Code Status: Full  Family Communication: Discussed extensively with his wife [name] who is a Allegra Grana here at [facility] on 12/02/2019  Disposition Plan: Patient came from home, likely to be discharged home, once work-up is complete, likely in 24 to 48 hours.   Consultants:  None  Procedures:  None  Antimicrobials:  None  DVT prophylaxis: Lovenox   Objective: Vitals:   12/02/19 0600 12/02/19 0800 12/02/19 0831 12/02/19 1213  BP: 12/04/19)  143/80 (!) 146/82 (!) 139/91 123/61  Pulse: 80 73 71 75  Resp: 20 18 20 20   Temp:  (!) 97.5 F (36.4 C) 97.7 F (36.5 C) 97.7 F (36.5 C)  TempSrc:  Oral Oral Oral  SpO2: 97% 98% 95% 100%  Weight:      Height:         Intake/Output Summary (Last 24 hours) at 12/02/2019 1457 Last data filed at 12/02/2019 1100 Gross per 24 hour  Intake --  Output 1000 ml  Net -1000 ml   Filed Weights   12/01/19 2001  Weight: 99.8 kg    Exam:  General: NAD   Cardiovascular: S1, S2 present  Respiratory: CTAB  Abdomen: Soft, mild generalized tenderness, nondistended, bowel sounds present  Musculoskeletal: No bilateral pedal edema noted  Skin: Normal  Psychiatry: Normal mood    Data Reviewed: CBC: Recent Labs  Lab 12/01/19 2006 12/02/19 0835  WBC 16.6* 14.6*  NEUTROABS  --  10.8*  HGB 15.3 14.7  HCT 46.0 43.5  MCV 92.6 92.2  PLT 267 223   Basic Metabolic Panel: Recent Labs  Lab 12/01/19 2006 12/02/19 0835  NA 133* 136  K 5.0 4.6  CL 99 103  CO2 23 23  GLUCOSE 490* 401*  BUN 20 23  CREATININE 2.57* 2.99*  CALCIUM 9.6 9.1   GFR: Estimated Creatinine Clearance: 27.1 mL/min (A) (by C-G formula based on SCr of 2.99 mg/dL (H)). Liver Function Tests: Recent Labs  Lab 12/01/19 2006  AST 20  ALT 52*  ALKPHOS 128*  BILITOT 1.7*  PROT 7.2  ALBUMIN 4.2   Recent Labs  Lab 12/01/19 2006  LIPASE 18   No results for input(s): AMMONIA in the last 168 hours. Coagulation Profile: No results for input(s): INR, PROTIME in the last 168 hours. Cardiac Enzymes: No results for input(s): CKTOTAL, CKMB, CKMBINDEX, TROPONINI in the last 168 hours. BNP (last 3 results) No results for input(s): PROBNP in the last 8760 hours. HbA1C: Recent Labs    12/02/19 0835  HGBA1C 9.6*   CBG: Recent Labs  Lab 12/02/19 1048 12/02/19 1218  GLUCAP 387* 372*   Lipid Profile: No results for input(s): CHOL, HDL, LDLCALC, TRIG, CHOLHDL, LDLDIRECT in the last 72 hours. Thyroid Function Tests: No results for input(s): TSH, T4TOTAL, FREET4, T3FREE, THYROIDAB in the last 72 hours. Anemia Panel: No results for input(s): VITAMINB12, FOLATE, FERRITIN, TIBC, IRON, RETICCTPCT in the last 72 hours. Urine  analysis:    Component Value Date/Time   COLORURINE YELLOW 12/01/2019 2003   APPEARANCEUR HAZY (A) 12/01/2019 2003   LABSPEC 1.024 12/01/2019 2003   PHURINE 6.0 12/01/2019 2003   GLUCOSEU >=500 (A) 12/01/2019 2003   HGBUR NEGATIVE 12/01/2019 2003   BILIRUBINUR NEGATIVE 12/01/2019 2003   KETONESUR NEGATIVE 12/01/2019 2003   PROTEINUR NEGATIVE 12/01/2019 2003   UROBILINOGEN 1.0 04/15/2014 1534   NITRITE NEGATIVE 12/01/2019 2003   LEUKOCYTESUR NEGATIVE 12/01/2019 2003   Sepsis Labs: @LABRCNTIP (procalcitonin:4,lacticidven:4)  )No results found for this or any previous visit (from the past 240 hour(s)).    Studies: CT ABDOMEN PELVIS WO CONTRAST  Result Date: 12/02/2019 CLINICAL DATA:  Abdominal distension EXAM: CT ABDOMEN AND PELVIS WITHOUT CONTRAST TECHNIQUE: Multidetector CT imaging of the abdomen and pelvis was performed following the standard protocol without IV contrast. COMPARISON:  09/23/2011 FINDINGS: LOWER CHEST: Normal. HEPATOBILIARY: Normal hepatic contours. No intra- or extrahepatic biliary dilatation. Small amount of perihepatic free fluid. Gallbladder is normal. PANCREAS: Fatty atrophy of the pancreas. SPLEEN: Normal. ADRENALS/URINARY TRACT:  The adrenal glands are normal. No hydronephrosis, nephroureterolithiasis or solid renal mass. Distended urinary bladder. STOMACH/BOWEL: There is inflammatory stranding adjacent to the second and third portion of the duodenum. There is fluid tracking into the right greater than left anterior pararenal space. No small bowel dilatation or inflammation. No focal colonic abnormality. Normal appendix. VASCULAR/LYMPHATIC: There is calcific atherosclerosis of the abdominal aorta. No abdominal or pelvic lymphadenopathy. REPRODUCTIVE: Enlarged prostate measures 6.2 cm in transverse dimension. MUSCULOSKELETAL. Multilevel degenerative disc disease and facet arthrosis. No bony spinal canal stenosis. OTHER: None. IMPRESSION: 1. Inflammatory stranding adjacent  to the second and third portion of the duodenum with fluid tracking into the right greater than left anterior pararenal space. This may indicate duodenitis, but may also be secondary to duodenal ulcer. Pancreatitis is considered less likely. 2. Distended urinary bladder. 3. Enlarged prostate. 4.  Aortic atherosclerosis (ICD10-I70.0). Electronically Signed   By: Ulyses Jarred M.D.   On: 12/02/2019 05:40   DG Abdomen Acute W/Chest  Result Date: 12/02/2019 CLINICAL DATA:  Abdominal pain EXAM: DG ABDOMEN ACUTE W/ 1V CHEST COMPARISON:  None. FINDINGS: There is no evidence of dilated bowel loops or free intraperitoneal air. No radiopaque calculi or other significant radiographic abnormality is seen. Heart size and mediastinal contours are within normal limits. Both lungs are clear. IMPRESSION: Negative abdominal radiographs.  No acute cardiopulmonary disease. Electronically Signed   By: Ulyses Jarred M.D.   On: 12/02/2019 05:32    Scheduled Meds: . aspirin EC  81 mg Oral Daily  . atorvastatin  80 mg Oral QHS  . carvedilol  3.125 mg Oral BID WC  . Chlorhexidine Gluconate Cloth  6 each Topical Daily  . clopidogrel  75 mg Oral Daily  . darifenacin  7.5 mg Oral Daily  . [START ON 12/03/2019] enoxaparin (LOVENOX) injection  30 mg Subcutaneous Q24H  . finasteride  5 mg Oral Daily  . insulin aspart  0-9 Units Subcutaneous TID WC    Continuous Infusions: . sodium chloride 100 mL/hr at 12/02/19 0858     LOS: 0 days     Alma Friendly, MD [facility]  If 7PM-7AM, please contact night-coverage [email or web address] 12/02/2019, 2:57 PM

## 2019-12-02 NOTE — Progress Notes (Signed)
Patient arrived to the unit from the ED. Patient ambulated from the stretcher in the hallway into room 5 to the bed independently. Patient was orientated to the room, all questions were answered and this author assisted the patient with ordering breakfast. Patient denies pain at this time. Call light personal belongings and room phone all within reach. Will continue to monitor throughout the shift.

## 2019-12-02 NOTE — ED Notes (Signed)
Patient taken to xray and CT.

## 2019-12-03 DIAGNOSIS — I2510 Atherosclerotic heart disease of native coronary artery without angina pectoris: Secondary | ICD-10-CM | POA: Diagnosis not present

## 2019-12-03 DIAGNOSIS — R338 Other retention of urine: Secondary | ICD-10-CM | POA: Diagnosis not present

## 2019-12-03 DIAGNOSIS — N400 Enlarged prostate without lower urinary tract symptoms: Secondary | ICD-10-CM | POA: Diagnosis not present

## 2019-12-03 DIAGNOSIS — Z20822 Contact with and (suspected) exposure to covid-19: Secondary | ICD-10-CM | POA: Diagnosis not present

## 2019-12-03 DIAGNOSIS — N289 Disorder of kidney and ureter, unspecified: Secondary | ICD-10-CM | POA: Diagnosis not present

## 2019-12-03 DIAGNOSIS — Z9861 Coronary angioplasty status: Secondary | ICD-10-CM | POA: Diagnosis not present

## 2019-12-03 DIAGNOSIS — R339 Retention of urine, unspecified: Secondary | ICD-10-CM | POA: Diagnosis not present

## 2019-12-03 DIAGNOSIS — Z7984 Long term (current) use of oral hypoglycemic drugs: Secondary | ICD-10-CM | POA: Diagnosis not present

## 2019-12-03 DIAGNOSIS — IMO0002 Reserved for concepts with insufficient information to code with codable children: Secondary | ICD-10-CM | POA: Diagnosis present

## 2019-12-03 DIAGNOSIS — K2980 Duodenitis without bleeding: Secondary | ICD-10-CM | POA: Diagnosis not present

## 2019-12-03 DIAGNOSIS — R110 Nausea: Secondary | ICD-10-CM | POA: Diagnosis not present

## 2019-12-03 DIAGNOSIS — E1165 Type 2 diabetes mellitus with hyperglycemia: Secondary | ICD-10-CM | POA: Diagnosis not present

## 2019-12-03 LAB — CBC WITH DIFFERENTIAL/PLATELET
Abs Immature Granulocytes: 0.03 10*3/uL (ref 0.00–0.07)
Basophils Absolute: 0 10*3/uL (ref 0.0–0.1)
Basophils Relative: 0 %
Eosinophils Absolute: 0.4 10*3/uL (ref 0.0–0.5)
Eosinophils Relative: 4 %
HCT: 38.4 % — ABNORMAL LOW (ref 39.0–52.0)
Hemoglobin: 12.8 g/dL — ABNORMAL LOW (ref 13.0–17.0)
Immature Granulocytes: 0 %
Lymphocytes Relative: 17 %
Lymphs Abs: 1.7 10*3/uL (ref 0.7–4.0)
MCH: 30.8 pg (ref 26.0–34.0)
MCHC: 33.3 g/dL (ref 30.0–36.0)
MCV: 92.3 fL (ref 80.0–100.0)
Monocytes Absolute: 1.2 10*3/uL — ABNORMAL HIGH (ref 0.1–1.0)
Monocytes Relative: 12 %
Neutro Abs: 6.8 10*3/uL (ref 1.7–7.7)
Neutrophils Relative %: 67 %
Platelets: 193 10*3/uL (ref 150–400)
RBC: 4.16 MIL/uL — ABNORMAL LOW (ref 4.22–5.81)
RDW: 13.3 % (ref 11.5–15.5)
WBC: 10.1 10*3/uL (ref 4.0–10.5)
nRBC: 0 % (ref 0.0–0.2)

## 2019-12-03 LAB — BASIC METABOLIC PANEL
Anion gap: 7 (ref 5–15)
BUN: 14 mg/dL (ref 8–23)
CO2: 23 mmol/L (ref 22–32)
Calcium: 8.6 mg/dL — ABNORMAL LOW (ref 8.9–10.3)
Chloride: 108 mmol/L (ref 98–111)
Creatinine, Ser: 0.97 mg/dL (ref 0.61–1.24)
GFR calc Af Amer: >60 mL/min (ref >60)
GFR calc non Af Amer: >60 mL/min (ref >60)
Glucose, Bld: 210 mg/dL — ABNORMAL HIGH (ref 70–99)
Potassium: 3.9 mmol/L (ref 3.5–5.1)
Sodium: 138 mmol/L (ref 135–145)

## 2019-12-03 LAB — GLUCOSE, CAPILLARY
Glucose-Capillary: 185 mg/dL — ABNORMAL HIGH (ref 70–99)
Glucose-Capillary: 254 mg/dL — ABNORMAL HIGH (ref 70–99)
Glucose-Capillary: 221 mg/dL — ABNORMAL HIGH (ref 70–99)

## 2019-12-03 LAB — HIV ANTIBODY (ROUTINE TESTING W REFLEX): HIV Screen 4th Generation wRfx: NONREACTIVE

## 2019-12-03 MED ORDER — BLOOD GLUCOSE METER KIT: PACK | 0 refills | Status: AC

## 2019-12-03 MED ORDER — PHENOL 1.4 % MT LIQD
1.0000 | OROMUCOSAL | Status: DC | PRN
  Filled 2019-12-03: qty 177

## 2019-12-03 MED ORDER — METFORMIN HCL 500 MG PO TABS: 500.0000 mg | ORAL_TABLET | Freq: Two times a day (BID) | ORAL | 0 refills | Status: DC

## 2019-12-03 MED ORDER — TAMSULOSIN HCL 0.4 MG PO CAPS: 0.4000 mg | ORAL_CAPSULE | Freq: Every day | ORAL | 0 refills | Status: AC

## 2019-12-03 MED ORDER — GLIMEPIRIDE 2 MG PO TABS: 2.0000 mg | ORAL_TABLET | ORAL | 0 refills | Status: DC

## 2019-12-03 MED ORDER — PANTOPRAZOLE SODIUM 40 MG PO TBEC: 40.0000 mg | DELAYED_RELEASE_TABLET | Freq: Every day | ORAL | 0 refills | Status: DC

## 2019-12-03 MED ORDER — LIVING WELL WITH DIABETES BOOK
Freq: Once | Status: AC
  Filled 2019-12-03: qty 1

## 2019-12-03 MED ORDER — FINASTERIDE 5 MG PO TABS: 5.0000 mg | ORAL_TABLET | Freq: Every day | ORAL | 0 refills | Status: AC

## 2019-12-03 NOTE — Progress Notes (Signed)
Inpatient Diabetes Program Recommendations  AACE/ADA: New Consensus Statement on Inpatient Glycemic Control (2015)  Target Ranges:  Prepandial:   less than 140 mg/dL      Peak postprandial:   less than 180 mg/dL (1-2 hours)      Critically ill patients:  140 - 180 mg/dL   Lab Results  Component Value Date   GLUCAP 254 (H) 12/03/2019   HGBA1C 9.6 (H) 12/02/2019    Review of Glycemic Control  Diabetes history: New Onset Diabetes Outpatient Diabetes medications: None Current orders for Inpatient glycemic control: Novolog sensitive correction + Novolog 3 units tid meal coverage  Inpatient Diabetes Program Recommendations:   Spoke with pt about new diagnosis. Discussed A1C results with them and explained what an A1C is, basic pathophysiology of DM Type 2, basic home care, basic diabetes diet nutrition principles, importance of checking CBGs and maintaining good CBG control to prevent long-term and short-term complications. Reviewed signs and symptoms of hyperglycemia and hypoglycemia and how to treat hypoglycemia at home. Also reviewed blood sugar goals at home.  RNs to provide ongoing basic DM education at bedside with this patient. Have ordered educational booklet and DM videos.  Patient states he can add additional exercise to his day to assist with CBGs and also decrease carbohydrates in his diet as well.  -Start oral medications such as Metformin 500 mg bid + Amaryl 2 mg qd and refer to his PCP. Patient also agrees to attend outpatient diabetes education @ [facility]. Spoke with Dr. Sharolyn Douglas and discussed above.  Thank you, Arthur Smith. Caleigh Rabelo, RN, MSN, CDE  Diabetes Coordinator Inpatient Glycemic Control Team Team Pager [phone] (8am-5pm) 12/03/2019 1:28 PM

## 2019-12-03 NOTE — Progress Notes (Signed)
   12/03/19 0327  Provider Notification  Provider Name/Title NP X. Blount  Date Provider Notified 12/03/19  Time Provider Notified 0327  Notification Type Page  Notification Reason Other (Comment) (pt request for sore throat medicine)  Response See new orders  Date of Provider Response 12/03/19  Time of Provider Response [phone]

## 2019-12-03 NOTE — Discharge Summary (Signed)
Discharge Summary  Arthur Smith EXB:[id number] DOB: 12/09/1950  PCP: Lavone Orn, MD  Admit date: 12/01/2019 Discharge date: 12/03/2019  Time spent: 40 mins  Recommendations for Outpatient Follow-up:  1. PCP in 1 week to follow-up with CBGs 2. Follow-up with urology in 1 week  Discharge Diagnoses:  Active Hospital Problems   Diagnosis Date Noted  . Acute urinary retention 11/15/2018  . Diabetes mellitus type 2, uncontrolled ([place]) 12/03/2019  . Duodenitis 12/02/2019  . Renal insufficiency 12/02/2019  . CAD S/P percutaneous coronary angioplasty 11/15/2018  . Hyperglycemia 11/15/2018    Resolved Hospital Problems  No resolved problems to display.    Discharge Condition: Stable  Diet recommendation: Moderate carb/heart healthy   Vitals:   12/03/19 0733 12/03/19 1150  BP: 104/78 107/68  Pulse: 92 75  Resp: 16 18  Temp: 98.1 F (36.7 C) 98.9 F (37.2 C)  SpO2: 98% 96%    History of present illness:  Arthur A Lessardis a 69 y.o.malewith medical history significant forCAD with stents, peripheral neuropathy, and arthritis, now presenting to emergency department with several days of lower abdominal pain. Patient reports that he has had difficulty urinating and has seen a urologist for this. He did not have any abdominal pain however until approximately 3 days ago when he developed aching discomfort in the lower abdomen/suprapubic region. He has had some nausea associated with this but no vomiting or diarrhea. Upon arrival to the ED, patient is found to be afebrile, saturating well on room air, and with stable blood pressure. Chemistry panel notable for creatinine of 2.57, up from 1 a year ago. CBC is notable for leukocytosis to 16,600. CT of the abdomen and pelvis with inflammatory stranding about the duodenum, no hydronephrosis, distended urinary bladder, and enlarged prostate. Patient was treated with Dilaudid, Zofran, and Protonix in the ED.    Today, pt is  feeling much better, denies any worsening abdominal pain, chest pain, SOB, fever/chills.  Patient to follow-up with PCP and urology.  Hospital Course:  Principal Problem:   Acute urinary retention Active Problems:   Duodenitis   Renal insufficiency   CAD S/P percutaneous coronary angioplasty   Hyperglycemia   Diabetes mellitus type 2, uncontrolled ([place])   Acute urinary retention/history of BPH History of BPH, has been followed by urology in the past, tried multiple medications, noncompliance Also patient noted to be on some medications such as Tofranil, Zonegran for his neuropathy which can cause urinary retention CT abdomen pelvis noted distended bladder and enlarged prostate, no hydronephrosis Foley inserted Hold home Myrbetriq, Vesicare for now Discussed with urology Dr. Alyson Ingles on 12/02/2019, recommend to start Flomax as well as Proscar, keep Foley in and follow-up in clinic within 1 week for voiding trial Discharge patient on Flomax, Proscar  AKI Resolved Baseline creatinine normal as of 11/2018 ??Likely due to above, obstructive uropathy S/p IV fluids,  continue Foley catheter Follow-up with PCP  Likely new onset diabetes mellitus type II Presented with hypoglycemia, CBG in the 400s, no evidence of DKA A1c done showed 9.6 No prior history of DM or prediabetes as per patient Most likely diabetes type 2 given age and presentation, further work-up could be done as an outpatient to exclude diabetes mellitus type 1 Discussed extensively with patient, diabetes coordinator and spouse, agreed to start patient on oral medications, Metformin, and glimepiride as patient is willing to adhere to diet restriction and physical activity Discharge home on glucometer, lancets and testing strips Advised patient to follow-up with PCP with CBG  logs and repeat of A1c  Duodenitis CT abdomen pelvis showed duodenitis H. pylori pending Continue PPI  History of CAD Chest pain-free Status  post 4 DES on 10/2018 Continue aspirin, Plavix (yet to complete Brilinta and switch to Plavix as per cardiology)  History of peripheral neuropathy Follows neurology, currently on max dose of gabapentin, Lyrica, Tofranil, Zonegran Advised to taper off/discontinue Tofranil, Zonegran for now as this may worsen urinary retention        Malnutrition Type:      Malnutrition Characteristics:      Nutrition Interventions:      Estimated body mass index is 33.45 kg/m as calculated from the following:   Height as of this encounter: 5' 8" (1.727 m).   Weight as of this encounter: 99.8 kg.    Procedures:  None  Consultations:  Discussed with urology  Discharge Exam: BP 107/68 (BP Location: Left Arm)   Pulse 75   Temp 98.9 F (37.2 C) (Oral)   Resp 18   Ht 5' 8" (1.727 m)   Wt 99.8 kg   SpO2 96%   BMI 33.45 kg/m   General: NAD Cardiovascular: S1, S2 present Respiratory: CTAB  Discharge Instructions You were cared for by a hospitalist during your hospital stay. If you have any questions about your discharge medications or the care you received while you were in the hospital after you are discharged, you can call the unit and asked to speak with the hospitalist on call if the hospitalist that took care of you is not available. Once you are discharged, your primary care physician will handle any further medical issues. Please note that NO REFILLS for any discharge medications will be authorized once you are discharged, as it is imperative that you return to your primary care physician (or establish a relationship with a primary care physician if you do not have one) for your aftercare needs so that they can reassess your need for medications and monitor your lab values.  Discharge Instructions    Ambulatory referral to Nutrition and Diabetic Education   Complete by: As directed    New Onset type 2   Diet - low sodium heart healthy   Complete by: As directed    Increase  activity slowly   Complete by: As directed      Allergies as of 12/03/2019      Reactions   Eggs Or Egg-derived Products Other (See Comments)   Egg yolks cause stomach cramps and diarrhea   Lactose Intolerance (gi) Other (See Comments)   Causes stomach cramps and diarrhea      Medication List    STOP taking these medications   Myrbetriq 50 MG Tb24 tablet Generic drug: mirabegron ER   solifenacin 10 MG tablet Commonly known as: VESICARE     TAKE these medications   aspirin EC 81 MG tablet Take 1 tablet (81 mg total) by mouth See admin instructions.   atorvastatin 80 MG tablet Commonly known as: LIPITOR Take 1 tablet (80 mg total) by mouth at bedtime.   blood glucose meter kit and supplies Dispense based on patient and insurance preference. Use up to four times daily as directed. (FOR ICD-10 E10.9, E11.9).   carvedilol 3.125 MG tablet Commonly known as: COREG TAKE 1 TABLET BY MOUTH TWICE A DAY WITH A MEAL What changed: See the new instructions.   cholecalciferol 25 MCG (1000 UNIT) tablet Commonly known as: VITAMIN D3 Take 1,000 Units by mouth daily at 6 PM.   clopidogrel  75 MG tablet Commonly known as: PLAVIX Take 1 tablet (75 mg total) by mouth daily.   Cyanocobalamin 2500 MCG Tabs Take 2,500 mcg by mouth daily at 6 PM. Vitamin B12   finasteride 5 MG tablet Commonly known as: PROSCAR Take 1 tablet (5 mg total) by mouth daily.   Fish Oil 1000 MG Caps Take 1,000 mg by mouth daily at 6 PM.   gabapentin 800 MG tablet Commonly known as: NEURONTIN Take 1 tablet (800 mg total) by mouth 4 (four) times daily. Take with 135m caps What changed: Another medication with the same name was changed. Make sure you understand how and when to take each.   gabapentin 100 MG capsule Commonly known as: NEURONTIN TAKE 1 CAPSULE BY MOUTH 4 TIMES DAILY (TAKE WITH 800 MG TABLETS) What changed:   how much to take  how to take this  when to take this   glimepiride 2 MG  tablet Commonly known as: Amaryl Take 1 tablet (2 mg total) by mouth every morning.   imipramine 150 MG capsule Commonly known as: TOFRANIL-PM Take 1 capsule (150 mg total) by mouth at bedtime.   metFORMIN 500 MG tablet Commonly known as: GLUCOPHAGE Take 1 tablet (500 mg total) by mouth 2 (two) times daily with a meal.   nitroGLYCERIN 0.4 MG SL tablet Commonly known as: NITROSTAT Place 1 tablet (0.4 mg total) under the tongue every 5 (five) minutes as needed for chest pain. DON'T take within 24 hrs of Viagra   pantoprazole 40 MG tablet Commonly known as: Protonix Take 1 tablet (40 mg total) by mouth daily.   pregabalin 100 MG capsule Commonly known as: LYRICA TAKE 1 CAPSULE BY MOUTH TWICE A DAY   Senna S 8.6-50 MG tablet Generic drug: senna-docusate Take 2 tablets by mouth at bedtime as needed (constipation).   sildenafil 50 MG tablet Commonly known as: VIAGRA Take 50 mg by mouth daily as needed for erectile dysfunction.   tamsulosin 0.4 MG Caps capsule Commonly known as: FLOMAX Take 1 capsule (0.4 mg total) by mouth daily after supper.   ticagrelor 90 MG Tabs tablet Commonly known as: BRILINTA Take 1 tablet (90 mg total) by mouth 2 (two) times daily.   zinc gluconate 50 MG tablet Take 50 mg by mouth daily.   zonisamide 100 MG capsule Commonly known as: ZONEGRAN TAKE 3 CAPSULES BY MOUTH ONCE DAILY AT BEDTIME What changed:   how much to take  how to take this  when to take this  additional instructions      Allergies  Allergen Reactions  . Eggs Or Egg-Derived Products Other (See Comments)    Egg yolks cause stomach cramps and diarrhea  . Lactose Intolerance (Gi) Other (See Comments)    Causes stomach cramps and diarrhea   Follow-up Information    GLavone Orn MD. Schedule an appointment as soon as possible for a visit in 1 week(s).   Specialty: Internal Medicine Contact information: [address] [place] [place] [phone]        CSanda Klein MD .   Specialty: Cardiology Contact information: [address] [phone]        MCleon Gustin MD. Schedule an appointment as soon as possible for a visit in 1 week(s).   Specialty: Urology Contact information: [address] [phone]           The results of significant diagnostics from this  hospitalization (including imaging, microbiology, ancillary and laboratory) are listed below for reference.    Significant Diagnostic Studies: CT ABDOMEN PELVIS WO CONTRAST  Result Date: 12/02/2019 CLINICAL DATA:  Abdominal distension EXAM: CT ABDOMEN AND PELVIS WITHOUT CONTRAST TECHNIQUE: Multidetector CT imaging of the abdomen and pelvis was performed following the standard protocol without IV contrast. COMPARISON:  09/23/2011 FINDINGS: LOWER CHEST: Normal. HEPATOBILIARY: Normal hepatic contours. No intra- or extrahepatic biliary dilatation. Small amount of perihepatic free fluid. Gallbladder is normal. PANCREAS: Fatty atrophy of the pancreas. SPLEEN: Normal. ADRENALS/URINARY TRACT: The adrenal glands are normal. No hydronephrosis, nephroureterolithiasis or solid renal mass. Distended urinary bladder. STOMACH/BOWEL: There is inflammatory stranding adjacent to the second and third portion of the duodenum. There is fluid tracking into the right greater than left anterior pararenal space. No small bowel dilatation or inflammation. No focal colonic abnormality. Normal appendix. VASCULAR/LYMPHATIC: There is calcific atherosclerosis of the abdominal aorta. No abdominal or pelvic lymphadenopathy. REPRODUCTIVE: Enlarged prostate measures 6.2 cm in transverse dimension. MUSCULOSKELETAL. Multilevel degenerative disc disease and facet arthrosis. No bony spinal canal stenosis. OTHER: None. IMPRESSION: 1. Inflammatory stranding adjacent to the second and third portion of the duodenum with  fluid tracking into the right greater than left anterior pararenal space. This may indicate duodenitis, but may also be secondary to duodenal ulcer. Pancreatitis is considered less likely. 2. Distended urinary bladder. 3. Enlarged prostate. 4.  Aortic atherosclerosis (ICD10-I70.0). Electronically Signed   By: Ulyses Jarred M.D.   On: 12/02/2019 05:40   DG Abdomen Acute W/Chest  Result Date: 12/02/2019 CLINICAL DATA:  Abdominal pain EXAM: DG ABDOMEN ACUTE W/ 1V CHEST COMPARISON:  None. FINDINGS: There is no evidence of dilated bowel loops or free intraperitoneal air. No radiopaque calculi or other significant radiographic abnormality is seen. Heart size and mediastinal contours are within normal limits. Both lungs are clear. IMPRESSION: Negative abdominal radiographs.  No acute cardiopulmonary disease. Electronically Signed   By: Ulyses Jarred M.D.   On: 12/02/2019 05:32    Microbiology: Recent Results (from the past 240 hour(s))  SARS CORONAVIRUS 2 (TAT 6-24 HRS) Nasopharyngeal Nasopharyngeal Swab     Status: None   Collection Time: 12/02/19  6:09 AM   Specimen: Nasopharyngeal Swab  Result Value Ref Range Status   SARS Coronavirus 2 NEGATIVE NEGATIVE Final    Comment: (NOTE) SARS-CoV-2 target nucleic acids are NOT DETECTED. The SARS-CoV-2 RNA is generally detectable in upper and lower respiratory specimens during the acute phase of infection. Negative results do not preclude SARS-CoV-2 infection, do not rule out co-infections with other pathogens, and should not be used as the sole basis for treatment or other patient management decisions. Negative results must be combined with clinical observations, patient history, and epidemiological information. The expected result is Negative. Fact Sheet for Patients: SugarRoll.be Fact Sheet for Healthcare Providers: https://www.woods-mathews.com/ This test is not yet approved or cleared by the [place]  FDA and  has been authorized for detection and/or diagnosis of SARS-CoV-2 by FDA under an Emergency Use Authorization (EUA). This EUA will remain  in effect (meaning this test can be used) for the duration of the COVID-19 declaration under Section 56 4(b)(1) of the Act, 21 U.S.C. section 360bbb-3(b)(1), unless the authorization is terminated or revoked sooner. Performed at [facility], [place] [address] [place], [place] [address]      Labs: Basic Metabolic Panel: Recent Labs  Lab 12/01/19 2006 12/02/19 0835 12/03/19 0417  NA 133* 136 138  K 5.0 4.6 3.9  CL  99 103 108  CO2 _0 GLUCOSE 490* 401* 210*  BUN _1 CREATININE 2.57* 2.99* 0.97  CALCIUM 9.6 9.1 8.6*   Liver Function Tests: Recent Labs  Lab 12/01/19 2006  AST 20  ALT 52*  ALKPHOS 128*  BILITOT 1.7*  PROT 7.2  ALBUMIN 4.2   Recent Labs  Lab 12/01/19 2006  LIPASE 18   No results for input(s): AMMONIA in the last 168 hours. CBC: Recent Labs  Lab 12/01/19 2006 12/02/19 0835 12/03/19 0417  WBC 16.6* 14.6* 10.1  NEUTROABS  --  10.8* 6.8  HGB 15.3 14.7 12.8*  HCT 46.0 43.5 38.4*  MCV 92.6 92.2 92.3  PLT 267 223 193   Cardiac Enzymes: No results for input(s): CKTOTAL, CKMB, CKMBINDEX, TROPONINI in the last 168 hours. BNP: BNP (last 3 results) No results for input(s): BNP in the last 8760 hours.  ProBNP (last 3 results) No results for input(s): PROBNP in the last 8760 hours.  CBG: Recent Labs  Lab 12/02/19 1218 12/02/19 1659 12/02/19 2131 12/03/19 0607 12/03/19 1148  GLUCAP 372* 266* 181* 185* 254*       Signed:  Alma Friendly, MD [facility] 12/03/2019, 2:29 PM

## 2019-12-03 NOTE — Progress Notes (Signed)
Patient has discharge orders but is unable to leave the unit at this time. Patient wife is a Engineer, civil (consulting) and is working today Teacher, music. Wife will be to the unit as soon as possible. Patient would like discharge instructions reviewed when wife is present. Patient is to discharge with foley intact per Dr. Sharolyn Douglas discharge summary. AVS is printed and placed in patient's chart. IV removed without complication.

## 2019-12-07 DIAGNOSIS — R972 Elevated prostate specific antigen [PSA]: Secondary | ICD-10-CM | POA: Diagnosis not present

## 2019-12-07 DIAGNOSIS — N401 Enlarged prostate with lower urinary tract symptoms: Secondary | ICD-10-CM | POA: Diagnosis not present

## 2019-12-07 DIAGNOSIS — R338 Other retention of urine: Secondary | ICD-10-CM | POA: Diagnosis not present

## 2019-12-08 DIAGNOSIS — E1169 Type 2 diabetes mellitus with other specified complication: Secondary | ICD-10-CM | POA: Diagnosis not present

## 2019-12-08 DIAGNOSIS — E1165 Type 2 diabetes mellitus with hyperglycemia: Secondary | ICD-10-CM | POA: Diagnosis not present

## 2019-12-08 DIAGNOSIS — N139 Obstructive and reflux uropathy, unspecified: Secondary | ICD-10-CM | POA: Diagnosis not present

## 2019-12-13 ENCOUNTER — Other Ambulatory Visit: Payer: Self-pay | Admitting: *Deleted

## 2019-12-13 DIAGNOSIS — E782 Mixed hyperlipidemia: Secondary | ICD-10-CM

## 2019-12-14 DIAGNOSIS — N401 Enlarged prostate with lower urinary tract symptoms: Secondary | ICD-10-CM | POA: Diagnosis not present

## 2019-12-14 DIAGNOSIS — R972 Elevated prostate specific antigen [PSA]: Secondary | ICD-10-CM | POA: Diagnosis not present

## 2019-12-14 DIAGNOSIS — R338 Other retention of urine: Secondary | ICD-10-CM | POA: Diagnosis not present

## 2019-12-21 DIAGNOSIS — N390 Urinary tract infection, site not specified: Secondary | ICD-10-CM | POA: Diagnosis not present

## 2019-12-21 DIAGNOSIS — B9620 Unspecified Escherichia coli [E. coli] as the cause of diseases classified elsewhere: Secondary | ICD-10-CM | POA: Diagnosis not present

## 2019-12-21 DIAGNOSIS — R338 Other retention of urine: Secondary | ICD-10-CM | POA: Diagnosis not present

## 2019-12-26 ENCOUNTER — Ambulatory Visit
Admission: RE | Admit: 2019-12-26 | Discharge: 2019-12-26 | Disposition: A | Discharge status: Home / Self Care | Payer: 59 | Source: Ambulatory Visit | Attending: Internal Medicine | Admitting: Internal Medicine

## 2019-12-26 ENCOUNTER — Other Ambulatory Visit: Payer: Self-pay | Admitting: Internal Medicine

## 2019-12-26 DIAGNOSIS — N401 Enlarged prostate with lower urinary tract symptoms: Secondary | ICD-10-CM | POA: Diagnosis not present

## 2019-12-26 DIAGNOSIS — M25511 Pain in right shoulder: Secondary | ICD-10-CM | POA: Diagnosis not present

## 2019-12-26 DIAGNOSIS — G603 Idiopathic progressive neuropathy: Secondary | ICD-10-CM | POA: Diagnosis not present

## 2019-12-26 DIAGNOSIS — E7800 Pure hypercholesterolemia, unspecified: Secondary | ICD-10-CM | POA: Diagnosis not present

## 2019-12-26 DIAGNOSIS — Z23 Encounter for immunization: Secondary | ICD-10-CM | POA: Diagnosis not present

## 2019-12-26 DIAGNOSIS — Z0000 Encounter for general adult medical examination without abnormal findings: Secondary | ICD-10-CM | POA: Diagnosis not present

## 2019-12-26 DIAGNOSIS — Z86010 Personal history of colonic polyps: Secondary | ICD-10-CM | POA: Diagnosis not present

## 2019-12-26 DIAGNOSIS — J301 Allergic rhinitis due to pollen: Secondary | ICD-10-CM | POA: Diagnosis not present

## 2019-12-26 DIAGNOSIS — I25118 Atherosclerotic heart disease of native coronary artery with other forms of angina pectoris: Secondary | ICD-10-CM | POA: Diagnosis not present

## 2019-12-26 DIAGNOSIS — E1169 Type 2 diabetes mellitus with other specified complication: Secondary | ICD-10-CM | POA: Diagnosis not present

## 2020-01-01 DIAGNOSIS — R338 Other retention of urine: Secondary | ICD-10-CM | POA: Diagnosis not present

## 2020-01-01 DIAGNOSIS — N401 Enlarged prostate with lower urinary tract symptoms: Secondary | ICD-10-CM | POA: Diagnosis not present

## 2020-01-01 DIAGNOSIS — R972 Elevated prostate specific antigen [PSA]: Secondary | ICD-10-CM | POA: Diagnosis not present

## 2020-01-01 DIAGNOSIS — R8279 Other abnormal findings on microbiological examination of urine: Secondary | ICD-10-CM | POA: Diagnosis not present

## 2020-01-02 ENCOUNTER — Other Ambulatory Visit: Payer: Self-pay | Admitting: *Deleted

## 2020-01-02 ENCOUNTER — Encounter: Payer: Self-pay | Admitting: *Deleted

## 2020-01-02 DIAGNOSIS — E782 Mixed hyperlipidemia: Secondary | ICD-10-CM

## 2020-01-04 ENCOUNTER — Other Ambulatory Visit: Payer: Self-pay | Admitting: Cardiovascular Disease

## 2020-01-05 ENCOUNTER — Other Ambulatory Visit: Payer: Self-pay | Admitting: Neurology

## 2020-01-08 DIAGNOSIS — R338 Other retention of urine: Secondary | ICD-10-CM | POA: Diagnosis not present

## 2020-01-16 ENCOUNTER — Other Ambulatory Visit: Payer: Self-pay | Admitting: Neurology

## 2020-01-16 DIAGNOSIS — R972 Elevated prostate specific antigen [PSA]: Secondary | ICD-10-CM | POA: Diagnosis not present

## 2020-01-16 DIAGNOSIS — R338 Other retention of urine: Secondary | ICD-10-CM | POA: Diagnosis not present

## 2020-01-18 ENCOUNTER — Telehealth: Payer: Self-pay | Admitting: Neurology

## 2020-01-18 ENCOUNTER — Other Ambulatory Visit: Payer: Self-pay | Admitting: *Deleted

## 2020-01-18 MED ORDER — GABAPENTIN 100 MG PO CAPS: ORAL_CAPSULE | ORAL | 1 refills | Status: DC

## 2020-01-18 MED ORDER — ZONISAMIDE 100 MG PO CAPS: ORAL_CAPSULE | ORAL | 3 refills | Status: DC

## 2020-01-18 NOTE — Telephone Encounter (Signed)
I called pt and he tried to reduce the zonegran to 2 tabs  Po qhs and was not able to tolerate, so has been taking 3 po qhs.  Refilled. Has appt in 09/2020.

## 2020-01-18 NOTE — Telephone Encounter (Signed)
1) Medication(s) Requested (by name): zonisamide (ZONEGRAN) 100 MG capsule  2) Pharmacy of Choice:  [id number] [place], [place] - [address]  [address], [place] [address]

## 2020-02-06 ENCOUNTER — Other Ambulatory Visit: Payer: Self-pay

## 2020-02-06 MED ORDER — IMIPRAMINE PAMOATE 150 MG PO CAPS: 150.0000 mg | ORAL_CAPSULE | Freq: Every day | ORAL | 3 refills | Status: DC

## 2020-03-11 DIAGNOSIS — R8279 Other abnormal findings on microbiological examination of urine: Secondary | ICD-10-CM | POA: Diagnosis not present

## 2020-03-11 DIAGNOSIS — R338 Other retention of urine: Secondary | ICD-10-CM | POA: Diagnosis not present

## 2020-03-11 DIAGNOSIS — N401 Enlarged prostate with lower urinary tract symptoms: Secondary | ICD-10-CM | POA: Diagnosis not present

## 2020-03-11 DIAGNOSIS — R972 Elevated prostate specific antigen [PSA]: Secondary | ICD-10-CM | POA: Diagnosis not present

## 2020-03-24 ENCOUNTER — Other Ambulatory Visit: Payer: Self-pay | Admitting: Neurology

## 2020-03-25 ENCOUNTER — Telehealth: Payer: Self-pay | Admitting: Neurology

## 2020-03-25 DIAGNOSIS — E1169 Type 2 diabetes mellitus with other specified complication: Secondary | ICD-10-CM | POA: Diagnosis not present

## 2020-03-25 DIAGNOSIS — K219 Gastro-esophageal reflux disease without esophagitis: Secondary | ICD-10-CM | POA: Diagnosis not present

## 2020-03-25 DIAGNOSIS — N401 Enlarged prostate with lower urinary tract symptoms: Secondary | ICD-10-CM | POA: Diagnosis not present

## 2020-03-25 DIAGNOSIS — I10 Essential (primary) hypertension: Secondary | ICD-10-CM | POA: Diagnosis not present

## 2020-03-25 NOTE — Telephone Encounter (Signed)
Pt called stating that he is needing a 3 month supply of his pregabalin (LYRICA) 100 MG capsule sent in to the [facility] on [address]. Pt states he only has 1 left. Please advise.

## 2020-03-25 NOTE — Telephone Encounter (Signed)
I have already sent the refill for Lyrica earlier today.

## 2020-05-13 DIAGNOSIS — Z23 Encounter for immunization: Secondary | ICD-10-CM | POA: Diagnosis not present

## 2020-05-27 ENCOUNTER — Other Ambulatory Visit: Payer: Self-pay | Admitting: Neurology

## 2020-06-16 ENCOUNTER — Other Ambulatory Visit: Payer: Self-pay | Admitting: Cardiovascular Disease

## 2020-08-05 DIAGNOSIS — R059 Cough, unspecified: Secondary | ICD-10-CM | POA: Diagnosis not present

## 2020-08-05 DIAGNOSIS — I10 Essential (primary) hypertension: Secondary | ICD-10-CM | POA: Diagnosis not present

## 2020-08-05 DIAGNOSIS — K219 Gastro-esophageal reflux disease without esophagitis: Secondary | ICD-10-CM | POA: Diagnosis not present

## 2020-08-05 DIAGNOSIS — E1169 Type 2 diabetes mellitus with other specified complication: Secondary | ICD-10-CM | POA: Diagnosis not present

## 2020-08-06 DIAGNOSIS — R059 Cough, unspecified: Secondary | ICD-10-CM | POA: Diagnosis not present

## 2020-08-06 DIAGNOSIS — Z03818 Encounter for observation for suspected exposure to other biological agents ruled out: Secondary | ICD-10-CM | POA: Diagnosis not present

## 2020-08-29 DIAGNOSIS — N401 Enlarged prostate with lower urinary tract symptoms: Secondary | ICD-10-CM | POA: Diagnosis not present

## 2020-09-05 DIAGNOSIS — R3915 Urgency of urination: Secondary | ICD-10-CM | POA: Diagnosis not present

## 2020-09-05 DIAGNOSIS — R972 Elevated prostate specific antigen [PSA]: Secondary | ICD-10-CM | POA: Diagnosis not present

## 2020-09-05 DIAGNOSIS — N401 Enlarged prostate with lower urinary tract symptoms: Secondary | ICD-10-CM | POA: Diagnosis not present

## 2020-09-23 ENCOUNTER — Other Ambulatory Visit: Payer: Self-pay | Admitting: Neurology

## 2020-10-01 ENCOUNTER — Encounter: Payer: Self-pay | Admitting: Neurology

## 2020-10-01 ENCOUNTER — Other Ambulatory Visit: Payer: Self-pay

## 2020-10-01 ENCOUNTER — Ambulatory Visit (INDEPENDENT_AMBULATORY_CARE_PROVIDER_SITE_OTHER): Payer: 59 | Admitting: Neurology

## 2020-10-01 VITALS — BP 114/70 | HR 57 | Ht 68.0 in | Wt 192.0 lb

## 2020-10-01 DIAGNOSIS — G609 Hereditary and idiopathic neuropathy, unspecified: Secondary | ICD-10-CM | POA: Diagnosis not present

## 2020-10-01 MED ORDER — IMIPRAMINE PAMOATE 150 MG PO CAPS: 150.0000 mg | ORAL_CAPSULE | Freq: Every day | ORAL | 3 refills | Status: DC

## 2020-10-01 MED ORDER — ZONISAMIDE 100 MG PO CAPS: ORAL_CAPSULE | ORAL | 3 refills | Status: DC

## 2020-10-01 MED ORDER — GABAPENTIN 100 MG PO CAPS: ORAL_CAPSULE | ORAL | 1 refills | Status: DC

## 2020-10-01 NOTE — Patient Instructions (Signed)
Continue current medications  Get blood work from Dr. Valentina Lucks  See you back in 1 year

## 2020-10-01 NOTE — Progress Notes (Signed)
PATIENT: Arthur Smith DOB: [birth year]  REASON FOR VISIT: follow up HISTORY FROM: patient  HISTORY OF PRESENT ILLNESS: Today 10/01/20 Arthur Smith is a 70 year old male with history of peripheral neuropathy, is felt to be inherited.  He has chronic low back pain, has had a right L4-5 laminectomy in 2016 with Dr. Vertell Limber.  He is on gabapentin and Lyrica.  He is also prescribed Tofranil and Zonegran from this office, since at least 2014.  With all of these medications, reports his neuropathy is under good control.  He is on max dose gabapentin, has return of symptoms reportedly with dose reduction. With neuropathy he has intense pain and burning without medications. Has chronic numbness knees down. No falls. He tried to cut back on Zonegran at bedtime, he felt very uncomfortable at bedtime, even after 1 night. Tried to cut out the gabapentin 100 mg, he felt more pain. He wants to stay on current medication doses, he is really comfortable where he is at. He walks for exercise, does some home weights. Lost 30 lbs last year. Here today follow-up unaccompanied.  HISTORY  09/28/2019 SS: Arthur Smith is a 70 year old male with history of peripheral neuropathy. Is felt to be inherited.  He has chronic low back pain, has had a right L4-L5 laminectomy in 2016 with Dr. Vertell Limber.  He is on gabapentin and Lyrica.  Apparently, he is also prescribed Tofranil and Zonegran from this office.  He has been on long-term doses of these medications, since at least 2014.  With all the medications, he says the neuropathy in his legs is under excellent control.  In the past, he says he has tried to decrease his dose of gabapentin, and he was miserable.  He has numbness intermittently from the knees down.  He has not had any falls.  He uses a cane for ambulation.  He lives with his wife, drives a car without difficulty.  He indicates he is in good health.  He indicates he is tolerating medications without side effect.  They help him  sleep at night.  He presents today for evaluation unaccompanied.  REVIEW OF SYSTEMS: Out of a complete 14 system review of symptoms, the patient complains only of the following symptoms, and all other reviewed systems are negative.  Numbness  ALLERGIES: Allergies  Allergen Reactions  . Eggs Or Egg-Derived Products Other (See Comments)    Egg yolks cause stomach cramps and diarrhea  . Lactose Intolerance (Gi) Other (See Comments)    Causes stomach cramps and diarrhea    HOME MEDICATIONS: Outpatient Medications Prior to Visit  Medication Sig Dispense Refill  . aspirin EC 81 MG tablet Take 1 tablet (81 mg total) by mouth See admin instructions.    Marland Kitchen atorvastatin (LIPITOR) 80 MG tablet Take 1 tablet (80 mg total) by mouth at bedtime. 90 tablet 3  . blood glucose meter kit and supplies Dispense based on patient and insurance preference. Use up to four times daily as directed. (FOR ICD-10 E10.9, E11.9). 1 each 0  . carvedilol (COREG) 3.125 MG tablet TAKE 1 TABLET BY MOUTH TWICE A DAY WITH A MEAL 180 tablet 3  . cholecalciferol (VITAMIN D3) 25 MCG (1000 UT) tablet Take 1,000 Units by mouth daily at 6 PM.    . clopidogrel (PLAVIX) 75 MG tablet Take 1 tablet (75 mg total) by mouth daily. 90 tablet 3  . Cyanocobalamin 2500 MCG TABS Take 2,500 mcg by mouth daily at 6 PM. Vitamin B12    .  gabapentin (NEURONTIN) 100 MG capsule TAKE 1 CAPSULE BY MOUTH 4 TIMES DAILY (TAKE WITH 800 MG TABLETS) 360 capsule 1  . gabapentin (NEURONTIN) 800 MG tablet TAKE 1 TABLET (800 MG TOTAL) BY MOUTH 4 (FOUR) TIMES DAILY. TAKE WITH 100MG CAPS 360 tablet 3  . imipramine (TOFRANIL-PM) 150 MG capsule Take 1 capsule (150 mg total) by mouth at bedtime. 90 capsule 3  . nitroGLYCERIN (NITROSTAT) 0.4 MG SL tablet Place 1 tablet (0.4 mg total) under the tongue every 5 (five) minutes as needed for chest pain. DON'T take within 24 hrs of Viagra 25 tablet 6  . Omega-3 Fatty Acids (FISH OIL) 1000 MG CAPS Take 1,000 mg by mouth daily  at 6 PM.    . pregabalin (LYRICA) 100 MG capsule TAKE 1 CAPSULE BY MOUTH TWICE A DAY 180 capsule 1  . senna-docusate (SENOKOT-S) 8.6-50 MG tablet Take 2 tablets by mouth at bedtime as needed (constipation).     . sildenafil (VIAGRA) 50 MG tablet Take 50 mg by mouth daily as needed for erectile dysfunction.    Marland Kitchen zinc gluconate 50 MG tablet Take 50 mg by mouth daily.    Marland Kitchen zonisamide (ZONEGRAN) 100 MG capsule TAKE 3 CAPSULES BY MOUTH ONCE DAILY AT BEDTIME 270 capsule 3  . BRILINTA 90 MG TABS tablet TAKE 1 TABLET TWICE A DAY 180 tablet 3  . glimepiride (AMARYL) 2 MG tablet Take 1 tablet (2 mg total) by mouth every morning. 30 tablet 0  . metFORMIN (GLUCOPHAGE) 500 MG tablet Take 1 tablet (500 mg total) by mouth 2 (two) times daily with a meal. 60 tablet 0  . pantoprazole (PROTONIX) 40 MG tablet Take 1 tablet (40 mg total) by mouth daily. 30 tablet 0   No facility-administered medications prior to visit.    PAST MEDICAL HISTORY: Past Medical History:  Diagnosis Date  . Arthritis   . BPH without obstruction/lower urinary tract symptoms   . Bronchitis with asthma, acute April 2013  . CAD S/P percutaneous coronary angioplasty 11/15/2018   Severe 2 vessel obstructive CAD:. Ost-pCx 95% (DES PCI  STENT SYNERGY DES 3.5X28) &p-mCx 100% (DES PCI STENT SYNERGY DES 2.25X32). pRCA 90%, mRCA 85% & rPDA 90%.dLAD ~40%.  EF 55 to 60%.  Moderately elevated LVEDP.  No R WMA.  Plan staged PCI to 3 RCA-PDA lesions 4/1  . Depression    denies  . Frequency of urination    URINATION AT NIGHT, PAINFUL SLOW STREAM   . GERD (gastroesophageal reflux disease)    occ  . Hyperlipidemia   . Neuromuscular disorder ([place])    DIZZINESS   PERIPHERAL NEUROPATHY   . Neuropathy   . No pertinent past medical history    RINGING RIGHT EAR  . NSTEMI (non-ST elevated myocardial infarction) ([place]) 11/13/2018  . Recurrent upper respiratory infection (URI)    SOB CURRENT COLD  . Shortness of breath    d/t "cold" 12/02/11    PAST  SURGICAL HISTORY: Past Surgical History:  Procedure Laterality Date  . CORONARY STENT INTERVENTION N/A 11/15/2018   Procedure: CORONARY STENT INTERVENTION;  Surgeon: Martinique, Peter M, MD;  Location: [place] IN[facility]ASIVE [facility];;  Ost-pCx 95% (DES PCI  STENT SYNERGY DES 3.5X28) &p-mCx 100% (DES PCI STENT SYNERGY DES 2.25X32)  . CORONARY STENT INTERVENTION N/A 11/16/2018   Procedure: CORONARY STENT INTERVENTION;  Surgeon: Belva Crome, MD;  Location: [place] [facility];  Service: Cardiovascular;  Laterality: N/A;  . FINGER SURGERY     RIGHT MIDDLE FINGER  .  HERNIA REPAIR     UMBILICAL HERNIA [id number]  . LEFT HEART CATH AND CORONARY ANGIOGRAPHY N/A 11/15/2018   Procedure: LEFT HEART CATH AND CORONARY ANGIOGRAPHY;  Surgeon: Martinique, Peter M, MD;  Location: [place] CV [facility];; Severe 2 vessel obstructive CAD:. Ost-pCx 95% (DES PCI) &p-mCx 100% (DES PCI). pRCA 90%, mRCA 85% & rPDA 90%.dLAD ~40%.  EF 55 to 60%.  Moderately elevated LVEDP.  No R WMA.  -- Plan staged PCI to 3 RCA-PDA lesions 4/1  . LUMBAR LAMINECTOMY/DECOMPRESSION MICRODISCECTOMY  12/22/2011   Procedure: LUMBAR LAMINECTOMY/DECOMPRESSION MICRODISCECTOMY 1 LEVEL;  Surgeon: Erline Levine, MD;  Location: [place] [facility];  Service: Neurosurgery;  Laterality: N/A;  Thoracic ten-eleven laminectomy  . LUMBAR LAMINECTOMY/DECOMPRESSION MICRODISCECTOMY Right 05/01/2014   Procedure: Right Lumbar three-four Microdiskectomy;  Surgeon: Erline Levine, MD;  Location: [place] [facility];  Service: Neurosurgery;  Laterality: Right;  . LUMBAR LAMINECTOMY/DECOMPRESSION MICRODISCECTOMY Right 11/08/2014   Procedure: Right L4-5 L5-S1 Laminectomy;  Surgeon: Erline Levine, MD;  Location: [place] [facility];  Service: Neurosurgery;  Laterality: Right;  Right L4-5 L5-S1 Laminectomy  . MULTIPLE TOOTH EXTRACTIONS      FAMILY HISTORY: Family History  Problem Relation Age of Onset  . Cancer Mother   . Stroke Father   . Neuropathy Brother     SOCIAL HISTORY: Social History   Socioeconomic  History  . Marital status: Married    Spouse name: [name]  . Number of children: 3  . Years of education: 76  . Highest education level: Not on file  Occupational History  . Occupation: Retired  Tobacco Use  . Smoking status: Former Smoker    Packs/day: 2.00    Years: 15.00    Pack years: 30.00    Types: Cigarettes    Quit date: 04/24/1984    Years since quitting: 36.4  . Smokeless tobacco: Never Used  Substance and Sexual Activity  . Alcohol use: Yes    Comment: occ  . Drug use: No  . Sexual activity: Not on file  Other Topics Concern  . Not on file  Social History Narrative   Patient lives at home [place] his wife.    Patient has 3 adult children and 1 step son.    Patient has a high school education.    Patient is retired.    [place] [employer]    Social Determinants of Health   Financial Resource Strain: Not on file  Food Insecurity: Not on file  Transportation Needs: Not on file  Physical Activity: Not on file  Stress: Not on file  Social Connections: Not on file  Intimate Partner Violence: Not on file   PHYSICAL EXAM  Vitals:   10/01/20 1343  BP: 114/70  Pulse: (!) 57  Weight: 192 lb (87.1 kg)  Height: _0  (1.727 m)   Body mass index is 29.19 kg/m.  Generalized: Well developed, in no acute distress  Neurological examination  Mentation: Alert oriented to time, place, history taking. Follows all commands speech and language fluent Cranial nerve II-XII: Pupils were equal round reactive to light. Extraocular movements were full, visual field were full on confrontational test. Facial sensation and strength were normal. Head turning and shoulder shrug  were normal and symmetric. Motor: The motor testing reveals 5 over 5 strength of all 4 extremities. Good symmetric motor tone is noted throughout.  Sensory: Stocking pattern sensory deficit to knee level, to soft touch, pinprick, vibration Coordination: Cerebellar testing reveals good finger-nose-finger and heel-to-shin  bilaterally.  Gait and station: Gait  is normal. Tandem gait is slightly unsteady.  Able to walk on heels and tiptoe. Reflexes: Deep tendon reflexes are symmetric but decreased throughout  DIAGNOSTIC DATA (LABS, IMAGING, TESTING) - I reviewed patient records, labs, notes, testing and imaging myself where available.  Lab Results  Component Value Date   WBC 10.1 12/03/2019   HGB 12.8 (L) 12/03/2019   HCT 38.4 (L) 12/03/2019   MCV 92.3 12/03/2019   PLT 193 12/03/2019      Component Value Date/Time   NA 138 12/03/2019 0417   K 3.9 12/03/2019 0417   CL 108 12/03/2019 0417   CO2 23 12/03/2019 0417   GLUCOSE 210 (H) 12/03/2019 0417   BUN 14 12/03/2019 0417   CREATININE 0.97 12/03/2019 0417   CALCIUM 8.6 (L) 12/03/2019 0417   PROT 7.2 12/01/2019 2006   PROT 6.5 05/10/2019 0921   ALBUMIN 4.2 12/01/2019 2006   ALBUMIN 4.3 05/10/2019 0921   AST 20 12/01/2019 2006   ALT 52 (H) 12/01/2019 2006   ALKPHOS 128 (H) 12/01/2019 2006   BILITOT 1.7 (H) 12/01/2019 2006   BILITOT 0.8 05/10/2019 0921   GFRNONAA >60 12/03/2019 0417   GFRAA >60 12/03/2019 0417   Lab Results  Component Value Date   CHOL 136 05/10/2019   HDL 36 (L) 05/10/2019   LDLCALC 67 05/10/2019   TRIG 195 (H) 05/10/2019   CHOLHDL 3.8 05/10/2019   Lab Results  Component Value Date   HGBA1C 9.6 (H) 12/02/2019   No results found for: VITAMINB12 No results found for: TSH  ASSESSMENT AND PLAN 70 y.o. year old male  has a past medical history of Arthritis, BPH without obstruction/lower urinary tract symptoms, Bronchitis with asthma, acute (April 2013), CAD S/P percutaneous coronary angioplasty (11/15/2018), Depression, Frequency of urination, GERD (gastroesophageal reflux disease), Hyperlipidemia, Neuromuscular disorder ([place]), Neuropathy, No pertinent past medical history, NSTEMI (non-ST elevated myocardial infarction) ([place]) (11/13/2018), Recurrent upper respiratory infection (URI), and Shortness of breath. here with:  1.   Peripheral neuropathy 2.  Chronic low back pain  -Condition remains overall stable, has continued the same medications as far back as 2014 -With medications, his neuropathy is under excellent control -He has tried to cut back on gabapentin and Zonegran without success -I will refill his current medications including:   Continue gabapentin 800 mg, 4 times a day Continue gabapentin 100 mg capsule, 4 times a day, with 800 mg Continue Lyrica 100 mg twice a day (not due yet) Continue Tofranil 150 mg at bedtime Continue Zonegran 100 mg capsule, 3 capsules at bedtime  -We will get recent labs from PCP -Follow-up in 1 year or sooner if needed  I spent 30 minutes of face-to-face and non-face-to-face time with patient.  This included previsit chart review, lab review, study review, order entry, electronic health record documentation, patient education.  Butler Denmark, AGNP-C, DNP 10/01/2020, 1:55 PM [facility] [address] [place], [place] [address] [phone]

## 2020-10-01 NOTE — Progress Notes (Signed)
I have read the note, and I agree with the clinical assessment and plan.  Charles K Willis   

## 2020-10-25 ENCOUNTER — Other Ambulatory Visit: Payer: Self-pay | Admitting: Physician Assistant

## 2020-11-14 ENCOUNTER — Ambulatory Visit: Payer: 59 | Admitting: Cardiovascular Disease

## 2020-12-04 IMAGING — CR DG SHOULDER 2+V*R*
3 series · 3 of 3 positions shown · non-contrast
Comparison: None.

CLINICAL DATA: Right shoulder pain x6 months.

EXAM:
RIGHT SHOULDER - 2+ VIEW

[w shoulder ap internal righ *]
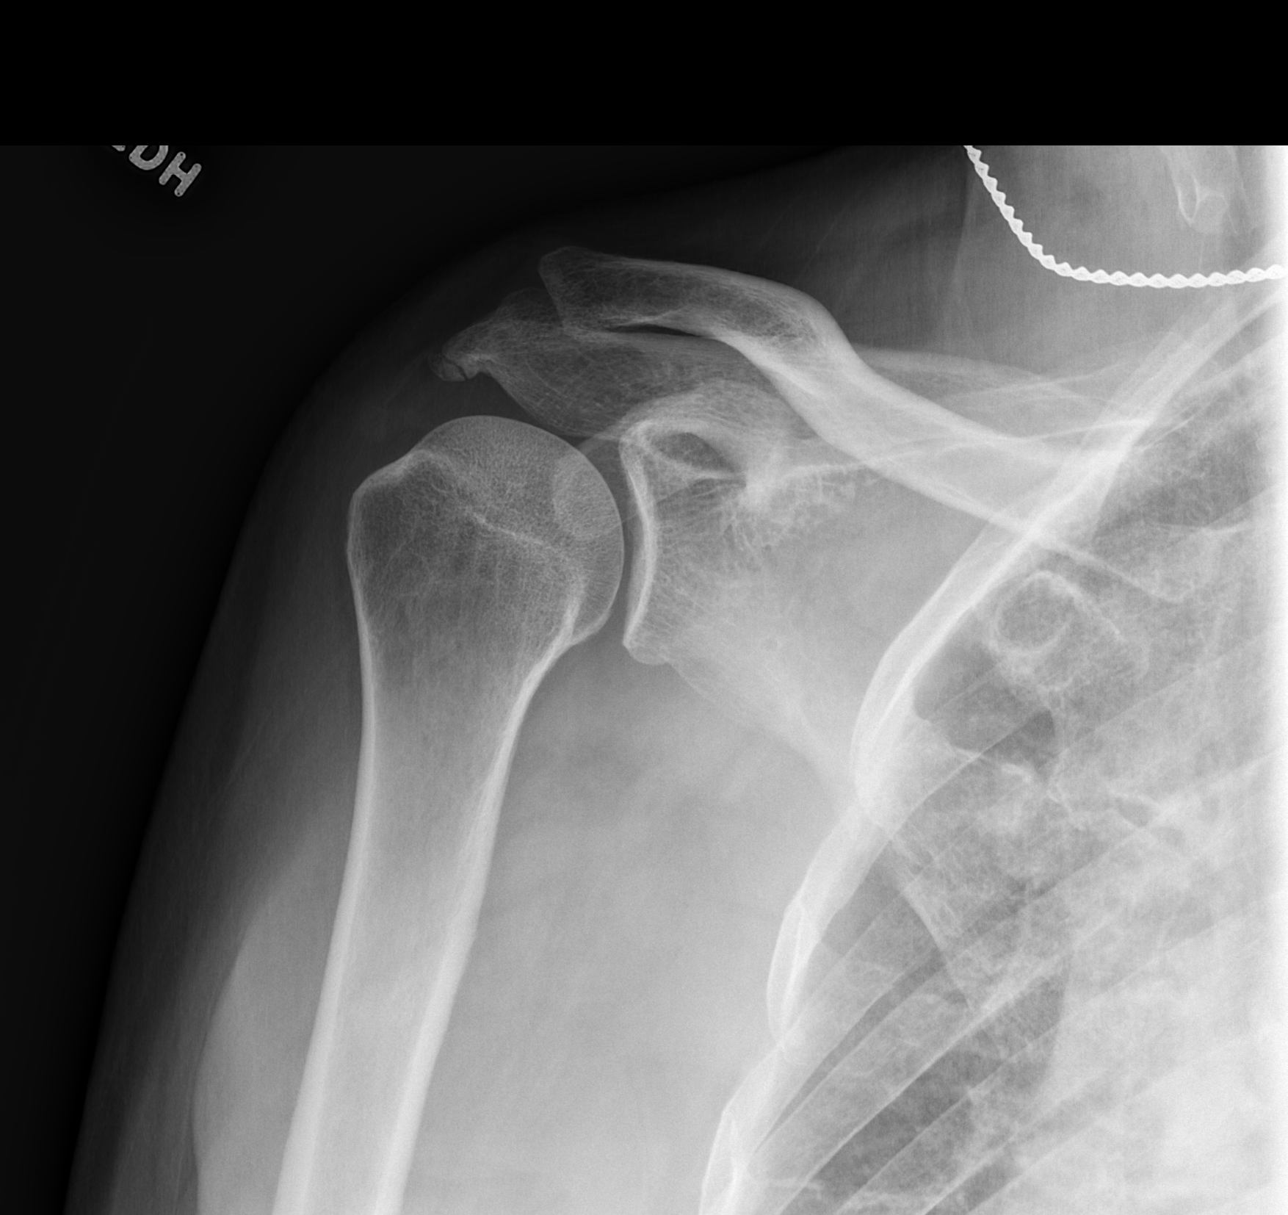

[w shoulder y view right *]
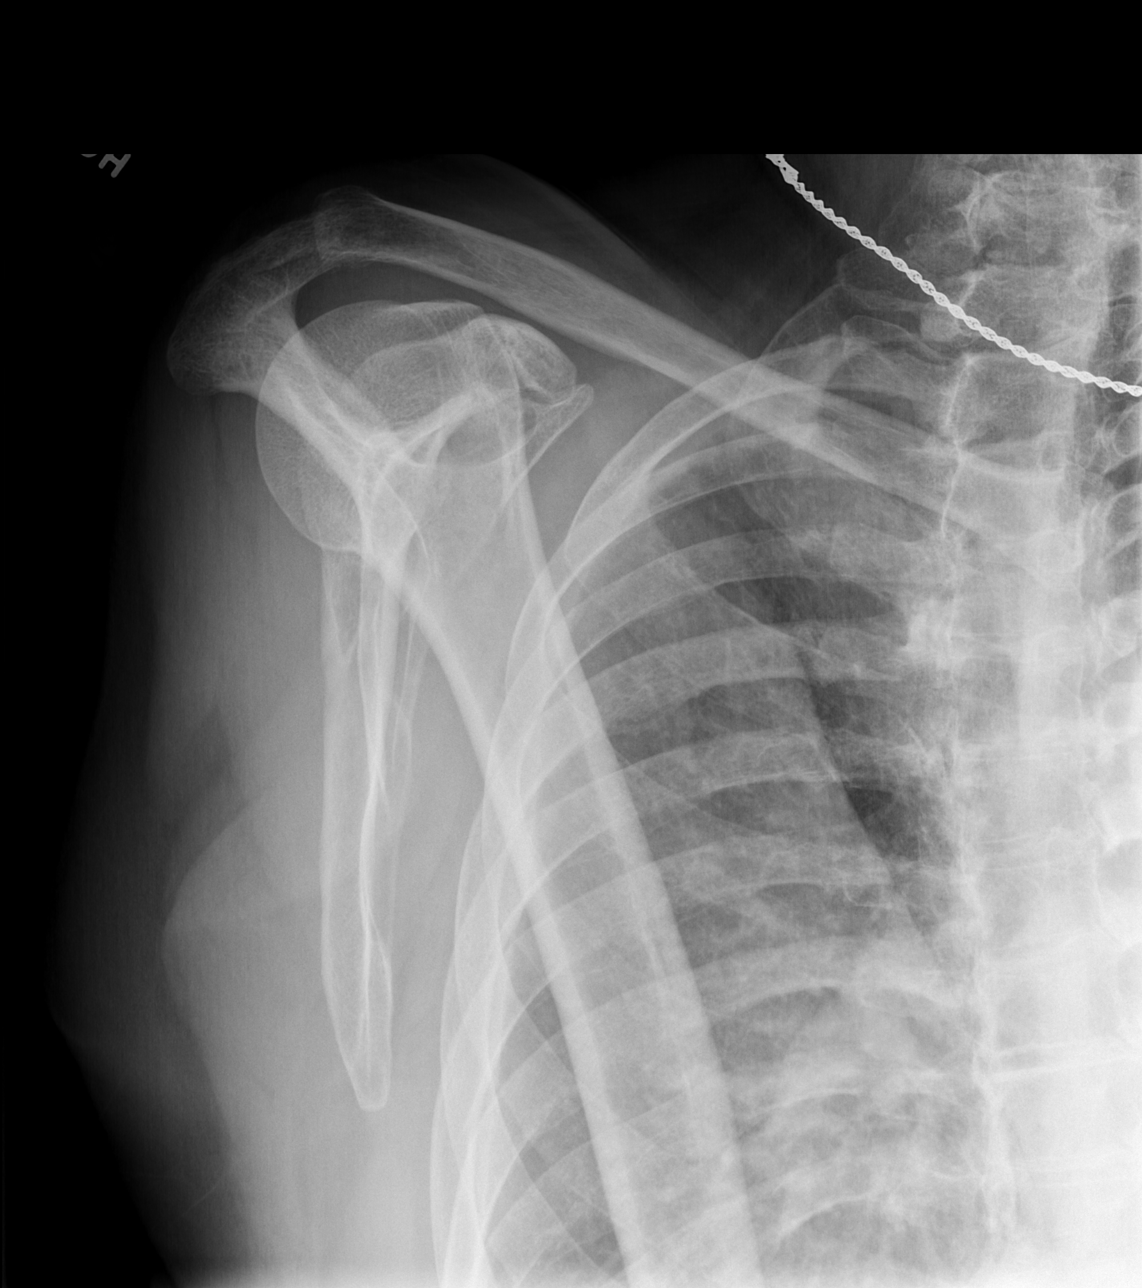

[w shoulder axillary right *]
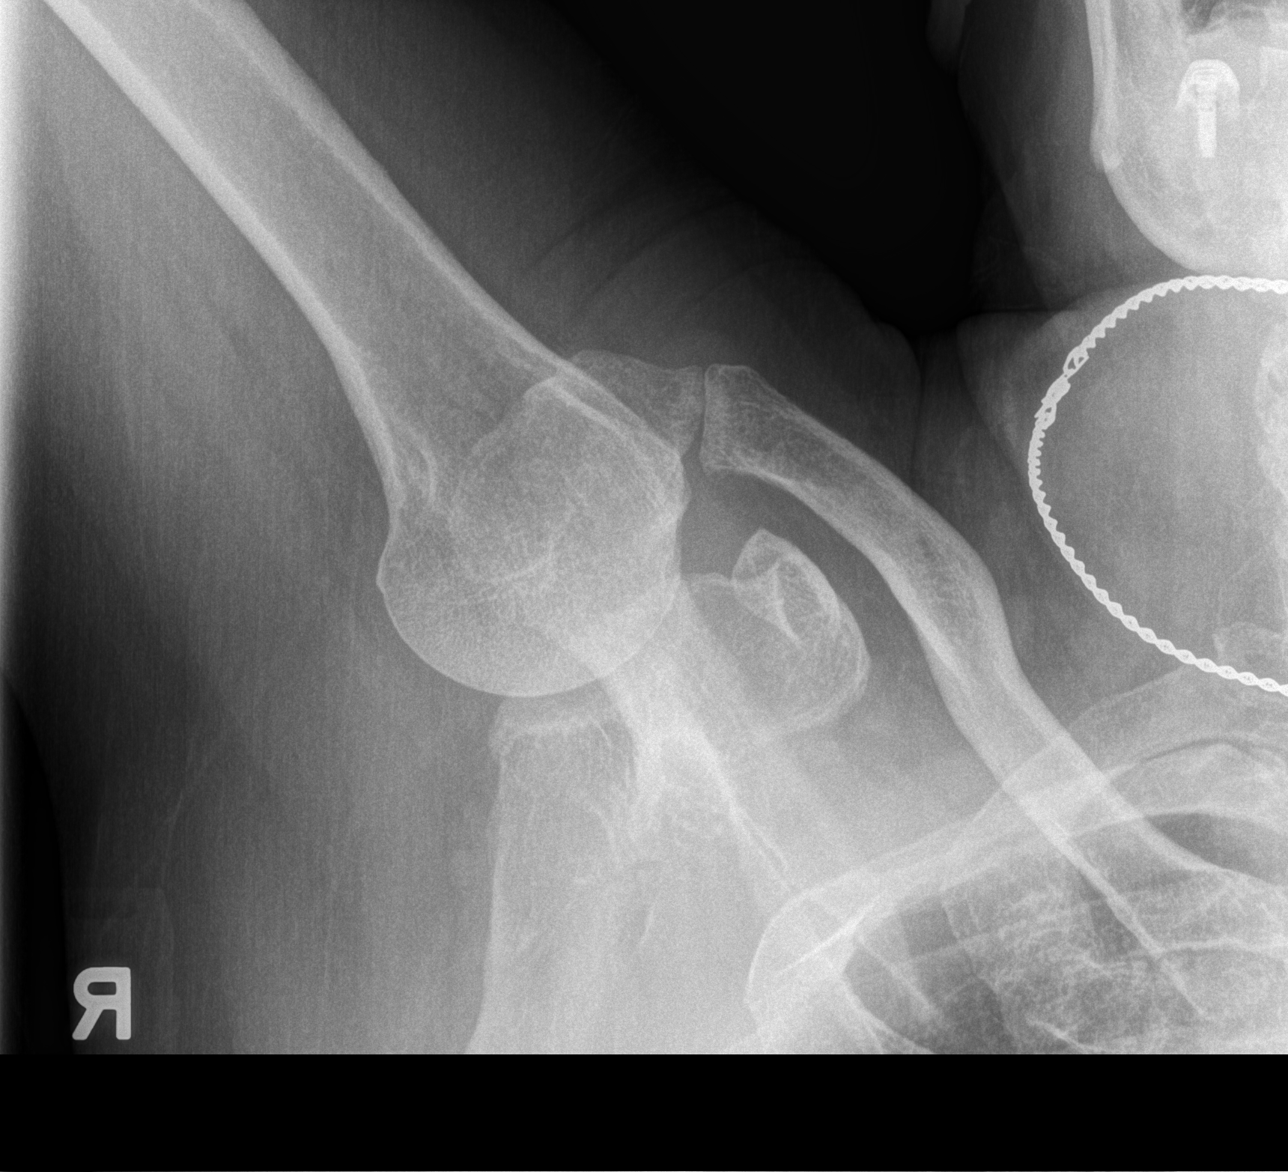

[3 of 3 positions shown; findings below may reference images not displayed]

FINDINGS: A small cortical defect of indeterminate age is seen along the
lateral aspect of the right acromion. There is no evidence of
dislocation. There is no evidence of arthropathy or other focal bone
abnormality. Soft tissues are unremarkable.
IMPRESSION: Small cortical defect of indeterminate age along the lateral aspect
of the right acromion. Correlation with focal tenderness is
recommended.

## 2020-12-20 ENCOUNTER — Encounter: Payer: Self-pay | Admitting: Cardiovascular Disease

## 2020-12-20 ENCOUNTER — Other Ambulatory Visit: Payer: Self-pay

## 2020-12-20 ENCOUNTER — Ambulatory Visit (INDEPENDENT_AMBULATORY_CARE_PROVIDER_SITE_OTHER): Payer: 59 | Admitting: Cardiovascular Disease

## 2020-12-20 VITALS — BP 98/62 | HR 54 | Ht 68.0 in | Wt 192.0 lb

## 2020-12-20 DIAGNOSIS — I2510 Atherosclerotic heart disease of native coronary artery without angina pectoris: Secondary | ICD-10-CM | POA: Diagnosis not present

## 2020-12-20 DIAGNOSIS — E782 Mixed hyperlipidemia: Secondary | ICD-10-CM

## 2020-12-20 DIAGNOSIS — E785 Hyperlipidemia, unspecified: Secondary | ICD-10-CM | POA: Diagnosis not present

## 2020-12-20 NOTE — Progress Notes (Signed)
Cardiology Office Note:    Date:  12/25/2020   ID:  Arthur Smith, DOB 1951-03-31, MRN [id number]  PCP:  Lavone Orn, MD  Cardiologist:  Sanda Klein, MD  Electrophysiologist:  None   Referring MD: Lavone Orn, MD   Chief Complaint  Patient presents with  . Coronary Artery Disease    History of Present Illness:     Arthur Smith is a 70 y.o. male with a hx of CAD, with an initial presentation with non-ST segment elevation myocardial infarction in March 2020.  At that time he was found to have high-grade stenoses in both the left circumflex and the right coronary artery and underwent staged PCI with a total of 4 drug-eluting stents (SYNERGY DES 3.5X28 Prox Cx,  STENT SYNERGY DES 2.25X32 distal circumflex, culprit lesion; ONYX 3.5 mm stent in RCA, 2.0 mm stent in PDA).   He has been doing well and has managed to keep off the weight that he lost after his heart attack, although he has not been able to lose any additional weight in the last 3 months.  Overall he is lost 50 pounds since his initial diagnosis of CAD.  He has not had any new cardiac problems.  The patient specifically denies any chest pain at rest exertion, dyspnea at rest or with exertion, orthopnea, paroxysmal nocturnal dyspnea, syncope, palpitations, focal neurological deficits, intermittent claudication, lower extremity edema, unexplained weight gain, cough, hemoptysis or wheezing.  His blood pressure and heart rate have been running relatively low, although he takes the lowest possible dose of carvedilol.  He occasionally uses an inhaler for reactive airway disease.  He is on maximum dose statin and continues to take dual antiplatelet therapy.    Past Medical History:  Diagnosis Date  . Arthritis   . BPH without obstruction/lower urinary tract symptoms   . Bronchitis with asthma, acute April 2013  . CAD S/P percutaneous coronary angioplasty 11/15/2018   Severe 2 vessel obstructive CAD:. Ost-pCx 95% (DES  PCI  STENT SYNERGY DES 3.5X28) &p-mCx 100% (DES PCI STENT SYNERGY DES 2.25X32). pRCA 90%, mRCA 85% & rPDA 90%.dLAD ~40%.  EF 55 to 60%.  Moderately elevated LVEDP.  No R WMA.  Plan staged PCI to 3 RCA-PDA lesions 4/1  . Depression    denies  . Frequency of urination    URINATION AT NIGHT, PAINFUL SLOW STREAM   . GERD (gastroesophageal reflux disease)    occ  . Hyperlipidemia   . Neuromuscular disorder ([place])    DIZZINESS   PERIPHERAL NEUROPATHY   . Neuropathy   . No pertinent past medical history    RINGING RIGHT EAR  . NSTEMI (non-ST elevated myocardial infarction) ([place]) 11/13/2018  . Recurrent upper respiratory infection (URI)    SOB CURRENT COLD  . Shortness of breath    d/t "cold" 12/02/11    Past Surgical History:  Procedure Laterality Date  . CORONARY STENT INTERVENTION N/A 11/15/2018   Procedure: CORONARY STENT INTERVENTION;  Surgeon: Martinique, Peter M, MD;  Location: [place] [facility];;  Ost-pCx 95% (DES PCI  STENT SYNERGY DES 3.5X28) &p-mCx 100% (DES PCI STENT SYNERGY DES 2.25X32)  . CORONARY STENT INTERVENTION N/A 11/16/2018   Procedure: CORONARY STENT INTERVENTION;  Surgeon: Belva Crome, MD;  Location: [place] [facility];  Service: Cardiovascular;  Laterality: N/A;  . FINGER SURGERY     RIGHT MIDDLE FINGER  . HERNIA REPAIR     UMBILICAL HERNIA 3220  . LEFT HEART CATH AND CORONARY ANGIOGRAPHY  N/A 11/15/2018   Procedure: LEFT HEART CATH AND CORONARY ANGIOGRAPHY;  Surgeon: Martinique, Peter M, MD;  Location: [place] [facility];; Severe 2 vessel obstructive CAD:. Ost-pCx 95% (DES PCI) &p-mCx 100% (DES PCI). pRCA 90%, mRCA 85% & rPDA 90%.dLAD ~40%.  EF 55 to 60%.  Moderately elevated LVEDP.  No R WMA.  -- Plan staged PCI to 3 RCA-PDA lesions 4/1  . LUMBAR LAMINECTOMY/DECOMPRESSION MICRODISCECTOMY  12/22/2011   Procedure: LUMBAR LAMINECTOMY/DECOMPRESSION MICRODISCECTOMY 1 LEVEL;  Surgeon: Erline Levine, MD;  Location: [place] [facility];  Service: Neurosurgery;  Laterality: N/A;  Thoracic  ten-eleven laminectomy  . LUMBAR LAMINECTOMY/DECOMPRESSION MICRODISCECTOMY Right 05/01/2014   Procedure: Right Lumbar three-four Microdiskectomy;  Surgeon: Erline Levine, MD;  Location: [place] [facility];  Service: Neurosurgery;  Laterality: Right;  . LUMBAR LAMINECTOMY/DECOMPRESSION MICRODISCECTOMY Right 11/08/2014   Procedure: Right L4-5 L5-S1 Laminectomy;  Surgeon: Erline Levine, MD;  Location: [place] [facility];  Service: Neurosurgery;  Laterality: Right;  Right L4-5 L5-S1 Laminectomy  . MULTIPLE TOOTH EXTRACTIONS      Current Medications: Current Meds  Medication Sig  . albuterol (VENTOLIN HFA) 108 (90 Base) MCG/ACT inhaler INHALE 2 PUFFS BY MOUTH EVERY 4 HOURS AS NEEDED 19  . aspirin EC 81 MG tablet Take 1 tablet (81 mg total) by mouth See admin instructions.  Marland Kitchen atorvastatin (LIPITOR) 80 MG tablet Take 1 tablet (80 mg total) by mouth at bedtime.  . B Complex Vitamins (VITAMIN B COMPLEX) TABS 1 tablet  . blood glucose meter kit and supplies Dispense based on patient and insurance preference. Use up to four times daily as directed. (FOR ICD-10 E10.9, E11.9).  . cholecalciferol (VITAMIN D3) 25 MCG (1000 UT) tablet Take 1,000 Units by mouth daily at 6 PM.  . clopidogrel (PLAVIX) 75 MG tablet Take 1 tablet (75 mg total) by mouth daily. Keep upcoming appointment  . Cyanocobalamin 2500 MCG TABS Take 2,500 mcg by mouth daily at 6 PM. Vitamin B12  . fluticasone (FLONASE) 50 MCG/ACT nasal spray 2 sprays  . gabapentin (NEURONTIN) 100 MG capsule TAKE 1 CAPSULE BY MOUTH 4 TIMES DAILY (TAKE WITH 800 MG TABLETS)  . gabapentin (NEURONTIN) 800 MG tablet TAKE 1 TABLET (800 MG TOTAL) BY MOUTH 4 (FOUR) TIMES DAILY. TAKE WITH 100MG CAPS  . glimepiride (AMARYL) 2 MG tablet Take 1 tablet (2 mg total) by mouth every morning.  Marland Kitchen imipramine (TOFRANIL-PM) 150 MG capsule Take 1 capsule (150 mg total) by mouth at bedtime.  . nitroGLYCERIN (NITROSTAT) 0.4 MG SL tablet Place 1 tablet (0.4 mg total) under the tongue every 5 (five)  minutes as needed for chest pain. DON'T take within 24 hrs of Viagra  . Omega-3 Fatty Acids (FISH OIL) 1000 MG CAPS Take 1,000 mg by mouth daily at 6 PM.  . pregabalin (LYRICA) 100 MG capsule TAKE 1 CAPSULE BY MOUTH TWICE A DAY  . senna-docusate (SENOKOT-S) 8.6-50 MG tablet Take 2 tablets by mouth at bedtime as needed (constipation).   . sildenafil (VIAGRA) 50 MG tablet Take 50 mg by mouth daily as needed for erectile dysfunction.  . tamsulosin (FLOMAX) 0.4 MG CAPS capsule Take 0.4 mg by mouth every 12 (twelve) hours.  Marland Kitchen zinc gluconate 50 MG tablet Take 50 mg by mouth daily.  Marland Kitchen zonisamide (ZONEGRAN) 100 MG capsule TAKE 3 CAPSULES BY MOUTH ONCE DAILY AT BEDTIME  . [DISCONTINUED] carvedilol (COREG) 3.125 MG tablet TAKE 1 TABLET BY MOUTH TWICE A DAY WITH A MEAL  . [DISCONTINUED] metFORMIN (GLUCOPHAGE) 500 MG tablet Take 1  tablet (500 mg total) by mouth 2 (two) times daily with a meal.  . [DISCONTINUED] pantoprazole (PROTONIX) 40 MG tablet Take 1 tablet (40 mg total) by mouth daily.     Allergies:   Eggs or egg-derived products and Lactose intolerance (gi)   Social History   Socioeconomic History  . Marital status: Married    Spouse name: [name]  . Number of children: 3  . Years of education: 77  . Highest education level: Not on file  Occupational History  . Occupation: Retired  Tobacco Use  . Smoking status: Former Smoker    Packs/day: 2.00    Years: 15.00    Pack years: 30.00    Types: Cigarettes    Quit date: 04/24/1984    Years since quitting: 36.6  . Smokeless tobacco: Never Used  Substance and Sexual Activity  . Alcohol use: Yes    Comment: occ  . Drug use: No  . Sexual activity: Not on file  Other Topics Concern  . Not on file  Social History Narrative   Patient lives at home [place] his wife.    Patient has 3 adult children and 1 step son.    Patient has a high school education.    Patient is retired.    [place] [employer]    Social Determinants of Radio broadcast assistant  Strain: Not on file  Food Insecurity: Not on file  Transportation Needs: Not on file  Physical Activity: Not on file  Stress: Not on file  Social Connections: Not on file     Family History: The patient's family history includes Cancer in his mother; Neuropathy in his brother; Stroke in his father.  ROS:   Please see the history of present illness.    All other systems are reviewed and are negative.   EKGs/Labs/Other Studies Reviewed:    The following studies were reviewed today: CATH 11/15/2018  Prox RCA lesion is 90% stenosed.  Mid RCA lesion is 85% stenosed.  RPDA lesion is 90% stenosed.  Dist LAD lesion is 40% stenosed.  Ost Cx to Prox Cx lesion is 95% stenosed.  A drug-eluting stent was successfully placed using a STENT SYNERGY DES 3.5X28.  Prox Cx to Mid Cx lesion is 100% stenosed.  Post intervention, there is a 0% residual stenosis.  A drug-eluting stent was successfully placed using a STENT SYNERGY DES 2.25X32.  Post intervention, there is a 0% residual stenosis.  The left ventricular systolic function is normal.  LV end diastolic pressure is moderately elevated.  The left ventricular ejection fraction is 55-65% by visual estimate.   1. Severe 2 vessel obstructive CAD.    - 95% proximal LCx. 100% distal LCx- this is the culprit lesion    - 90% proximal RCA, 85% segmental mid RCA, 90% PDA 2. Normal LV function 3. Moderately elevated LVEDP 4. Successful PCI of the proximal and distal LCX with DES x 2.   Plan: DAPT for one year. Hydrate and observe renal function. Planned for stage PCI of the RCA and PDA in next 48 hours.   CATH 11/16/2018   A stent was successfully placed.  A stent was successfully placed.    Successful RCA PCI resulting in proximal 90% stenosis, mid to distal 85% stenosis and PDA 90% stenosis being reduced to 0%, 0%, and 0% using Onyx drug-eluting stents.  TIMI grade III flow was noted.  The RCA proper was treated with 3.5 mm  Onyx stents postdilated to 3.75 mm.  The  PDA was treated with 2.0 Onyx stent deployed at 14 atm.  RECOMMENDATIONS:   Aspirin and Brilinta for at least 12 months.  Consider dropping aspirin after 3 months if any issue with bleeding.  Aggressive risk factor modification.  Anticipate discharge in a.m.    EKG:  EKG is ordered today and shows sinus bradycardia with a single PAC, generalized low voltage, no repolarization abnormalities, QTC 417 ms  Recent Labs: No results found for requested labs within last 8760 hours.  Recent Lipid Panel    Component Value Date/Time   CHOL 126 12/20/2020 1239   TRIG 146 12/20/2020 1239   HDL 37 (L) 12/20/2020 1239   CHOLHDL 3.4 12/20/2020 1239   CHOLHDL 4.8 11/15/2018 0240   VLDL 58 (H) 11/15/2018 0240   LDLCALC 64 12/20/2020 1239    Physical Exam:    VS:  BP 98/62 (BP Location: Right Arm, Patient Position: Sitting, Cuff Size: Normal)   Pulse (!) 54   Ht '5\' 8"'  (1.727 m)   Wt 192 lb (87.1 kg)   BMI 29.19 kg/m     Wt Readings from Last 3 Encounters:  12/20/20 192 lb (87.1 kg)  10/01/20 192 lb (87.1 kg)  12/01/19 220 lb (99.8 kg)     GEN:  Well nourished, well developed in no acute distress HEENT: Normal NECK: No JVD; No carotid bruits LYMPHATICS: No lymphadenopathy CARDIAC: RRR, no murmurs, rubs, gallops RESPIRATORY:  Clear to auscultation without rales, wheezing or rhonchi  ABDOMEN: Soft, non-tender, non-distended MUSCULOSKELETAL:  No edema; No deformity  SKIN: Warm and dry NEUROLOGIC:  Alert and oriented x 3 PSYCHIATRIC:  Normal affect   ASSESSMENT:    1. Coronary artery disease involving native coronary artery of native heart without angina pectoris   2. Dyslipidemia (high LDL; low HDL)    PLAN:    In order of problems listed above:  1. CAD: Asymptomatic.  On dual antiplatelet therapy due to the large volume of stent material placed, although he is now 2 years from his initial revascularization procedure.  We will  switch his beta-blocker to metoprolol succinate 25 mg once daily with the option to cut this in half if he continues to have low blood pressure and slow heart rate. 2. HLP: On max dose statin with a good LDL level, albeit still with a depressed HDL.  The answer there will be continued efforts at losing weight and more physical exercise.  Neuropathy slows him down more than anything else.   Medication Adjustments/Labs and Tests Ordered: Current medicines are reviewed at length with the patient today.  Concerns regarding medicines are outlined above.  Orders Placed This Encounter  Procedures  . Lipid panel  . EKG 12-Lead   No orders of the defined types were placed in this encounter.   Patient Instructions  Medication Instructions:  No changes *If you need a refill on your cardiac medications before your next appointment, please call your pharmacy*   Lab Work: Your provider would like for you to have the following labs today: lipids  If you have labs (blood work) drawn today and your tests are completely normal, you will receive your results only by: Marland Kitchen MyChart Message (if you have MyChart) OR . A paper copy in the mail If you have any lab test that is abnormal or we need to change your treatment, we will call you to review the results.   Testing/Procedures: None ordered   Follow-Up: At [facility], you and your health needs are our  priority.  As part of our continuing mission to provide you with exceptional heart care, we have created designated Provider Care Teams.  These Care Teams include your primary Cardiologist (physician) and Advanced Practice Providers (APPs -  Physician Assistants and Nurse Practitioners) who all work together to provide you with the care you need, when you need it.  We recommend signing up for the patient portal called "MyChart".  Sign up information is provided on this After Visit Summary.  MyChart is used to connect with patients for Virtual Visits  (Telemedicine).  Patients are able to view lab/test results, encounter notes, upcoming appointments, etc.  Non-urgent messages can be sent to your provider as well.   To learn more about what you can do with MyChart, go to [email or web address].    Your next appointment:   12 month(s)  The format for your next appointment:   In Person  Provider:   You may see Sanda Klein, MD or one of the following Advanced Practice Providers on your designated Care Team:    Almyra Deforest, PA-C  Fabian Sharp, [place] or   Roby Lofts, PA-C       Signed, Sanda Klein, MD  12/25/2020 10:50 AM    [place]

## 2020-12-20 NOTE — Patient Instructions (Signed)
Medication Instructions:  No changes *If you need a refill on your cardiac medications before your next appointment, please call your pharmacy*   Lab Work: Your provider would like for you to have the following labs today: lipids  If you have labs (blood work) drawn today and your tests are completely normal, you will receive your results only by: Marland Kitchen MyChart Message (if you have MyChart) OR . A paper copy in the mail If you have any lab test that is abnormal or we need to change your treatment, we will call you to review the results.   Testing/Procedures: None ordered   Follow-Up: At [facility], you and your health needs are our priority.  As part of our continuing mission to provide you with exceptional heart care, we have created designated Provider Care Teams.  These Care Teams include your primary Cardiologist (physician) and Advanced Practice Providers (APPs -  Physician Assistants and Nurse Practitioners) who all work together to provide you with the care you need, when you need it.  We recommend signing up for the patient portal called "MyChart".  Sign up information is provided on this After Visit Summary.  MyChart is used to connect with patients for Virtual Visits (Telemedicine).  Patients are able to view lab/test results, encounter notes, upcoming appointments, etc.  Non-urgent messages can be sent to your provider as well.   To learn more about what you can do with MyChart, go to [email or web address].    Your next appointment:   12 month(s)  The format for your next appointment:   In Person  Provider:   You may see Thurmon Fair, MD or one of the following Advanced Practice Providers on your designated Care Team:    Azalee Course, PA-C  Micah Flesher, PA-C or   Judy Pimple, [place]

## 2020-12-21 LAB — LIPID PANEL
Chol/HDL Ratio: 3.4 ratio (ref 0.0–5.0)
Cholesterol, Total: 126 mg/dL (ref 100–199)
HDL: 37 mg/dL — ABNORMAL LOW (ref >39)
LDL Chol Calc (NIH): 64 mg/dL (ref 0–99)
Triglycerides: 146 mg/dL (ref 0–149)
VLDL Cholesterol Cal: 25 mg/dL (ref 5–40)

## 2020-12-24 ENCOUNTER — Telehealth: Payer: Self-pay | Admitting: Cardiovascular Disease

## 2020-12-24 MED ORDER — METOPROLOL SUCCINATE ER 25 MG PO TB24: 25.0000 mg | ORAL_TABLET | Freq: Every day | ORAL | 3 refills | Status: DC

## 2020-12-24 NOTE — Telephone Encounter (Signed)
     Pt c/o medication issue:  1. Name of Medication: Metoprolol  2. How are you currently taking this medication (dosage and times per day)?   3. Are you having a reaction (difficulty breathing--STAT)?   4. What is your medication issue? Pt said Dr. Salena Saner wants him to take metoprolol to start taking but it is not sent to his pharmacy yet at [facility]

## 2020-12-24 NOTE — Telephone Encounter (Signed)
Carvedilol has been discontinued. Metoprolol Succinate 25 mg once daily has been sent in per the patient request.

## 2020-12-24 NOTE — Telephone Encounter (Signed)
Called the patient back. He stated at the last office visit that Dr. Royann Shivers wanted to switch him from Carvedilol to Metoprolol. Message sent to provider for verification.

## 2020-12-24 NOTE — Telephone Encounter (Signed)
Yes, we did discuss that and I forgot to tell you.  Sorry.  Please stop carvedilol and start metoprolol succinate 25 mg once daily.

## 2020-12-25 ENCOUNTER — Encounter: Payer: Self-pay | Admitting: *Deleted

## 2020-12-26 DIAGNOSIS — I25118 Atherosclerotic heart disease of native coronary artery with other forms of angina pectoris: Secondary | ICD-10-CM | POA: Diagnosis not present

## 2020-12-26 DIAGNOSIS — Z0000 Encounter for general adult medical examination without abnormal findings: Secondary | ICD-10-CM | POA: Diagnosis not present

## 2020-12-26 DIAGNOSIS — G603 Idiopathic progressive neuropathy: Secondary | ICD-10-CM | POA: Diagnosis not present

## 2020-12-26 DIAGNOSIS — N401 Enlarged prostate with lower urinary tract symptoms: Secondary | ICD-10-CM | POA: Diagnosis not present

## 2020-12-26 DIAGNOSIS — K219 Gastro-esophageal reflux disease without esophagitis: Secondary | ICD-10-CM | POA: Diagnosis not present

## 2020-12-26 DIAGNOSIS — E7800 Pure hypercholesterolemia, unspecified: Secondary | ICD-10-CM | POA: Diagnosis not present

## 2020-12-26 DIAGNOSIS — L309 Dermatitis, unspecified: Secondary | ICD-10-CM | POA: Diagnosis not present

## 2020-12-26 DIAGNOSIS — E1169 Type 2 diabetes mellitus with other specified complication: Secondary | ICD-10-CM | POA: Diagnosis not present

## 2021-01-02 DIAGNOSIS — Z20822 Contact with and (suspected) exposure to covid-19: Secondary | ICD-10-CM | POA: Diagnosis not present

## 2021-02-10 DIAGNOSIS — R300 Dysuria: Secondary | ICD-10-CM | POA: Diagnosis not present

## 2021-02-10 DIAGNOSIS — R310 Gross hematuria: Secondary | ICD-10-CM | POA: Diagnosis not present

## 2021-02-21 DIAGNOSIS — N3289 Other specified disorders of bladder: Secondary | ICD-10-CM | POA: Diagnosis not present

## 2021-02-21 DIAGNOSIS — N400 Enlarged prostate without lower urinary tract symptoms: Secondary | ICD-10-CM | POA: Diagnosis not present

## 2021-02-21 DIAGNOSIS — R130 Aphagia: Secondary | ICD-10-CM | POA: Diagnosis not present

## 2021-02-21 DIAGNOSIS — R310 Gross hematuria: Secondary | ICD-10-CM | POA: Diagnosis not present

## 2021-03-07 DIAGNOSIS — R972 Elevated prostate specific antigen [PSA]: Secondary | ICD-10-CM | POA: Diagnosis not present

## 2021-03-17 DIAGNOSIS — B9620 Unspecified Escherichia coli [E. coli] as the cause of diseases classified elsewhere: Secondary | ICD-10-CM | POA: Diagnosis not present

## 2021-03-17 DIAGNOSIS — N390 Urinary tract infection, site not specified: Secondary | ICD-10-CM | POA: Diagnosis not present

## 2021-03-17 DIAGNOSIS — R972 Elevated prostate specific antigen [PSA]: Secondary | ICD-10-CM | POA: Diagnosis not present

## 2021-03-17 DIAGNOSIS — R300 Dysuria: Secondary | ICD-10-CM | POA: Diagnosis not present

## 2021-03-24 DIAGNOSIS — R972 Elevated prostate specific antigen [PSA]: Secondary | ICD-10-CM | POA: Diagnosis not present

## 2021-03-24 DIAGNOSIS — N401 Enlarged prostate with lower urinary tract symptoms: Secondary | ICD-10-CM | POA: Diagnosis not present

## 2021-03-24 DIAGNOSIS — R310 Gross hematuria: Secondary | ICD-10-CM | POA: Diagnosis not present

## 2021-03-24 DIAGNOSIS — R300 Dysuria: Secondary | ICD-10-CM | POA: Diagnosis not present

## 2021-03-25 ENCOUNTER — Telehealth: Payer: Self-pay

## 2021-03-25 NOTE — Telephone Encounter (Signed)
   Name: Arthur Smith  DOB: 09-15-1950  MRN: [id number]   Primary Cardiologist: Thurmon Fair, MD  Chart reviewed as part of pre-operative protocol coverage. Patient was contacted 03/25/2021 in reference to pre-operative risk assessment for pending surgery as outlined below.  Arthur Smith was last seen on 12/20/2020 by Dr. Royann Shivers.  Since that day, Arthur Smith has done well without any exertional chest pain or worsening dyspnea.  He is clearly able to accomplish more than 4 METS of activity.  Therefore, based on ACC/AHA guidelines, the patient would be at acceptable risk for the planned procedure without further cardiovascular testing.   The patient was advised that if he develops new symptoms prior to surgery to contact our office to arrange for a follow-up visit, and he verbalized understanding.  I will route this recommendation to the requesting party via Epic fax function and remove from pre-op pool. Please call with questions.  Dr. Royann Shivers to review, from your perspective, can the patient hold Plavix for 5 days prior to the procedure?  Please forward your response to P CV DIV PREOP  Azalee Course, PA 03/25/2021, 3:45 PM

## 2021-03-25 NOTE — Telephone Encounter (Signed)
Per Dr. Royann Shivers, "OK to hold clopidogrel for 5-7 days before the procedure"  Callback pool, please inform the patient. I attempted to call the patient back to inform him myself but the lady who picked up was clearly drunk and unable to take in what I said.

## 2021-03-25 NOTE — Telephone Encounter (Signed)
   [facility] Pre-operative Risk Assessment    Patient Name: Arthur Smith  DOB: 05-23-1951 MRN: [id number]  HEARTCARE STAFF:  - IMPORTANT!!!!!! Under Visit Info/Reason for Call, type in Other and utilize the format Clearance MM/DD/YY or Clearance TBD. Do not use dashes or single digits. - Please review there is not already an duplicate clearance open for this procedure. - If request is for dental extraction, please clarify the # of teeth to be extracted. - If the patient is currently at the dentist's office, call Pre-Op Callback Staff (MA/nurse) to input urgent request.  - If the patient is not currently in the dentist office, please route to the Pre-Op pool.  Request for surgical clearance:  What type of surgery is being performed? PROSTATE BIOPSY  When is this surgery scheduled? 04-29-2021  What type of clearance is required (medical clearance vs. Pharmacy clearance to hold med vs. Both)? BOTH  Are there any medications that need to be held prior to surgery and how long? PLAVIX 5 DAYS PRIOR  Practice name and name of physician performing surgery? [facility] DR Ellison Hughs  ATTN:PATTI, LPN  What is the office phone number? [phone] x [phone]    7.   What is the office fax number? [phone]  8.   Anesthesia type (None, local, MAC, general) ? LIDO 2% NO EPI   Waylan Rocher 03/25/2021, 2:36 PM  _________________________________________________________________   (provider comments below)

## 2021-03-26 NOTE — Telephone Encounter (Signed)
Spoke with patient and made him aware of message below from Dr [employer]. He verbalized understanding and appreciation for the call. Let him know that I would route this information to requesting surgeons office. Sent via epic fax function.

## 2021-03-28 ENCOUNTER — Other Ambulatory Visit: Payer: Self-pay | Admitting: Neurology

## 2021-04-29 DIAGNOSIS — R972 Elevated prostate specific antigen [PSA]: Secondary | ICD-10-CM | POA: Diagnosis not present

## 2021-04-29 DIAGNOSIS — N411 Chronic prostatitis: Secondary | ICD-10-CM | POA: Diagnosis not present

## 2021-04-29 DIAGNOSIS — N410 Acute prostatitis: Secondary | ICD-10-CM | POA: Diagnosis not present

## 2021-05-12 DIAGNOSIS — N401 Enlarged prostate with lower urinary tract symptoms: Secondary | ICD-10-CM | POA: Diagnosis not present

## 2021-05-12 DIAGNOSIS — R972 Elevated prostate specific antigen [PSA]: Secondary | ICD-10-CM | POA: Diagnosis not present

## 2021-05-12 DIAGNOSIS — R3915 Urgency of urination: Secondary | ICD-10-CM | POA: Diagnosis not present

## 2021-05-19 ENCOUNTER — Other Ambulatory Visit: Payer: Self-pay | Admitting: Neurology

## 2021-05-19 DIAGNOSIS — Z23 Encounter for immunization: Secondary | ICD-10-CM | POA: Diagnosis not present

## 2021-05-30 ENCOUNTER — Other Ambulatory Visit: Payer: Self-pay | Admitting: Neurology

## 2021-06-10 ENCOUNTER — Other Ambulatory Visit: Payer: Self-pay | Admitting: Neurology

## 2021-06-20 ENCOUNTER — Other Ambulatory Visit: Payer: Self-pay | Admitting: Neurology

## 2021-07-03 DIAGNOSIS — M542 Cervicalgia: Secondary | ICD-10-CM | POA: Diagnosis not present

## 2021-07-03 DIAGNOSIS — E1169 Type 2 diabetes mellitus with other specified complication: Secondary | ICD-10-CM | POA: Diagnosis not present

## 2021-07-03 DIAGNOSIS — N401 Enlarged prostate with lower urinary tract symptoms: Secondary | ICD-10-CM | POA: Diagnosis not present

## 2021-07-03 DIAGNOSIS — K219 Gastro-esophageal reflux disease without esophagitis: Secondary | ICD-10-CM | POA: Diagnosis not present

## 2021-07-05 ENCOUNTER — Other Ambulatory Visit: Payer: Self-pay | Admitting: Neurology

## 2021-07-08 ENCOUNTER — Other Ambulatory Visit: Payer: Self-pay | Admitting: Neurology

## 2021-07-08 ENCOUNTER — Other Ambulatory Visit: Payer: Self-pay | Admitting: Cardiovascular Disease

## 2021-07-22 ENCOUNTER — Other Ambulatory Visit: Payer: Self-pay | Admitting: Neurology

## 2021-08-02 ENCOUNTER — Other Ambulatory Visit: Payer: Self-pay | Admitting: Cardiovascular Disease

## 2021-09-01 ENCOUNTER — Other Ambulatory Visit: Payer: Self-pay | Admitting: Neurology

## 2021-10-01 ENCOUNTER — Ambulatory Visit (INDEPENDENT_AMBULATORY_CARE_PROVIDER_SITE_OTHER): Payer: Medicare Other | Admitting: Neurology

## 2021-10-01 ENCOUNTER — Encounter: Payer: Self-pay | Admitting: Neurology

## 2021-10-01 VITALS — BP 103/65 | HR 53 | Ht 68.0 in | Wt 181.0 lb

## 2021-10-01 DIAGNOSIS — G609 Hereditary and idiopathic neuropathy, unspecified: Secondary | ICD-10-CM | POA: Diagnosis not present

## 2021-10-01 MED ORDER — ZONISAMIDE 100 MG PO CAPS: ORAL_CAPSULE | ORAL | 0 refills | Status: DC

## 2021-10-01 MED ORDER — PREGABALIN 100 MG PO CAPS: 100.0000 mg | ORAL_CAPSULE | Freq: Two times a day (BID) | ORAL | 0 refills | Status: DC

## 2021-10-01 NOTE — Patient Instructions (Signed)
Referral to pain clinic  Continue current medications Return back here as needed

## 2021-10-01 NOTE — Progress Notes (Signed)
PATIENT: Arthur Smith DOB: Sep 06, 1950  REASON FOR VISIT: follow up for peripheral neuropathy  HISTORY FROM: patient PRIMARY NEUROLOGIST: Dr. Jannifer Franklin   HISTORY OF PRESENT ILLNESS: Today 10/01/21 Arthur Smith is here today for follow-up with history of peripheral neuropathy, felt to be inherited.  He is on gabapentin, Lyrica, Tofranil, Zonegran from this office. Chronic back pain, 2016 had back surgery with Dr.Stern. Neuropathy pain is well controlled as long as takes pills every 6 hours. When taking the medication late, fills pins and needles in the feet, burning. He can't sleep at night due to symptoms without the medications. No falls or gait instability. 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 No recent health issues. Neuropathy is stable. Has lost 50-60 lbs in 3 years.   Update 10/01/2020 SS: Arthur Smith is a 71 year old male with history of peripheral neuropathy, is felt to be inherited.  He has chronic low back pain, has had a right L4-5 laminectomy in 2016 with Dr. Vertell Limber.  He is on gabapentin and Lyrica.  He is also prescribed Tofranil and Zonegran from this office, since at least 2014.  With all of these medications, reports his neuropathy is under good control.  He is on max dose gabapentin, has return of symptoms reportedly with dose reduction. With neuropathy he has intense pain and burning without medications. Has chronic numbness knees down. No falls. He tried to cut back on Zonegran at bedtime, he felt very uncomfortable at bedtime, even after 1 night. Tried to cut out the gabapentin 100 mg, he felt more pain. He wants to stay on current medication doses, he is really comfortable where he is at. He walks for exercise, does some home weights. Lost 30 lbs last year. Here today follow-up unaccompanied.  HISTORY  09/28/2019 SS: Arthur Smith is a 71 year old male with history of peripheral neuropathy. Is felt to be inherited.  He has chronic low back pain, has  had a right L4-L5 laminectomy in 2016 with Dr. Vertell Limber.  He is on gabapentin and Lyrica.  Apparently, he is also prescribed Tofranil and Zonegran from this office.  He has been on long-term doses of these medications, since at least 2014.  With all the medications, he says the neuropathy in his legs is under excellent control.  In the past, he says he has tried to decrease his dose of gabapentin, and he was miserable.  He has numbness intermittently from the knees down.  He has not had any falls.  He uses a cane for ambulation.  He lives with his wife, drives a car without difficulty.  He indicates he is in good health.  He indicates he is tolerating medications without side effect.  They help him sleep at night.  He presents today for evaluation unaccompanied.  REVIEW OF SYSTEMS: Out of a complete 14 system review of symptoms, the patient complains only of the following symptoms, and all other reviewed systems are negative.  See HPI  ALLERGIES: Allergies  Allergen Reactions   Eggs Or Egg-Derived Products Other (See Comments)    Egg yolks cause stomach cramps and diarrhea   Lactose Intolerance (Gi) Other (See Comments)    Causes stomach cramps and diarrhea    HOME MEDICATIONS: Outpatient Medications Prior to Visit  Medication Sig Dispense Refill   albuterol (VENTOLIN HFA) 108 (90 Base) MCG/ACT inhaler INHALE 2 PUFFS BY MOUTH EVERY 4 HOURS AS NEEDED 19     aspirin EC 81 MG  tablet Take 1 tablet (81 mg total) by mouth See admin instructions.     atorvastatin (LIPITOR) 80 MG tablet Take 1 tablet (80 mg total) by mouth at bedtime. 90 tablet 3   B Complex Vitamins (VITAMIN B COMPLEX) TABS 1 tablet     blood glucose meter kit and supplies Dispense based on patient and insurance preference. Use up to four times daily as directed. (FOR ICD-10 E10.9, E11.9). 1 each 0   cholecalciferol (VITAMIN D3) 25 MCG (1000 UT) tablet Take 1,000 Units by mouth daily at 6 PM.     clopidogrel (PLAVIX) 75 MG tablet Take  1 tablet (75 mg total) by mouth daily. 90 tablet 3   Cyanocobalamin 2500 MCG TABS Take 2,500 mcg by mouth daily at 6 PM. Vitamin B12     finasteride (PROSCAR) 5 MG tablet Take 5 mg by mouth daily.     fluticasone (FLONASE) 50 MCG/ACT nasal spray 2 sprays     gabapentin (NEURONTIN) 800 MG tablet TAKE 1 TABLET (800 MG TOTAL) BY MOUTH 4 (FOUR) TIMES DAILY. TAKE WITH 100MG CAPS 360 tablet 1   imipramine (TOFRANIL-PM) 150 MG capsule TAKE 1 CAPSULE BY MOUTH AT BEDTIME. 90 capsule 3   metoprolol succinate (TOPROL-XL) 25 MG 24 hr tablet Take 1 tablet (25 mg total) by mouth daily. 90 tablet 3   nitroGLYCERIN (NITROSTAT) 0.4 MG SL tablet Place 1 tablet (0.4 mg total) under the tongue every 5 (five) minutes as needed for chest pain. DON'T take within 24 hrs of Viagra 25 tablet 6   Omega-3 Fatty Acids (FISH OIL) 1000 MG CAPS Take 1,000 mg by mouth daily at 6 PM.     pantoprazole (PROTONIX) 40 MG tablet Take 40 mg by mouth daily.     senna-docusate (SENOKOT-S) 8.6-50 MG tablet Take 2 tablets by mouth at bedtime as needed (constipation).      sildenafil (VIAGRA) 50 MG tablet Take 50 mg by mouth daily as needed for erectile dysfunction.     tamsulosin (FLOMAX) 0.4 MG CAPS capsule Take 0.4 mg by mouth every 12 (twelve) hours.     zinc gluconate 50 MG tablet Take 50 mg by mouth daily.     pregabalin (LYRICA) 100 MG capsule TAKE 1 CAPSULE BY MOUTH TWICE A DAY 180 capsule 1   zonisamide (ZONEGRAN) 100 MG capsule TAKE 3 CAPSULES BY MOUTH ONCE DAILY AT BEDTIME 270 capsule 0   gabapentin (NEURONTIN) 100 MG capsule TAKE 1 CAPSULE BY MOUTH 4 TIMES DAILY (TAKE WITH 800 MG TABLETS) (Patient not taking: Reported on 10/01/2021) 360 capsule 1   glimepiride (AMARYL) 2 MG tablet Take 1 tablet (2 mg total) by mouth every morning. 30 tablet 0   No facility-administered medications prior to visit.    PAST MEDICAL HISTORY: Past Medical History:  Diagnosis Date   Arthritis    BPH without obstruction/lower urinary tract  symptoms    Bronchitis with asthma, acute April 2013   CAD S/P percutaneous coronary angioplasty 11/15/2018   Severe 2 vessel obstructive CAD:. Ost-pCx 95% (DES PCI  STENT SYNERGY DES 3.5X28) &p-mCx 100% (DES PCI STENT SYNERGY DES 2.25X32). pRCA 90%, mRCA 85% & rPDA 90%.dLAD ~40%.  EF 55 to 60%.  Moderately elevated LVEDP.  No R WMA.  Plan staged PCI to 3 RCA-PDA lesions 4/1   Depression    denies   Frequency of urination    URINATION AT NIGHT, PAINFUL SLOW STREAM    GERD (gastroesophageal reflux disease)    occ   Hyperlipidemia  Neuromuscular disorder ([place])    DIZZINESS   PERIPHERAL NEUROPATHY    Neuropathy    No pertinent past medical history    RINGING RIGHT EAR   NSTEMI (non-ST elevated myocardial infarction) ([place]) 11/13/2018   Recurrent upper respiratory infection (URI)    SOB CURRENT COLD   Shortness of breath    d/t "cold" 12/02/11    PAST SURGICAL HISTORY: Past Surgical History:  Procedure Laterality Date   CORONARY STENT INTERVENTION N/A 11/15/2018   Procedure: CORONARY STENT INTERVENTION;  Surgeon: Martinique, Peter M, MD;  Location: [facility] INVASIVE CV LAB;;  Ost-pCx 95% (DES PCI  STENT SYNERGY DES 3.5X28) &p-mCx 100% (DES PCI STENT SYNERGY DES 2.25X32)   CORONARY STENT INTERVENTION N/A 11/16/2018   Procedure: CORONARY STENT INTERVENTION;  Surgeon: Belva Crome, MD;  Location: [place] CV LAB;  Service: Cardiovascular;  Laterality: N/A;   FINGER SURGERY     RIGHT MIDDLE FINGER   HERNIA REPAIR     UMBILICAL HERNIA 1324   LEFT HEART CATH AND CORONARY ANGIOGRAPHY N/A 11/15/2018   Procedure: LEFT HEART CATH AND CORONARY ANGIOGRAPHY;  Surgeon: Martinique, Peter M, MD;  Location: [place] CV LAB;; Severe 2 vessel obstructive CAD:. Ost-pCx 95% (DES PCI) &p-mCx 100% (DES PCI). pRCA 90%, mRCA 85% & rPDA 90%.dLAD ~40%.  EF 55 to 60%.  Moderately elevated LVEDP.  No R WMA.  -- Plan staged PCI to 3 RCA-PDA lesions 4/1   LUMBAR LAMINECTOMY/DECOMPRESSION MICRODISCECTOMY  12/22/2011    Procedure: LUMBAR LAMINECTOMY/DECOMPRESSION MICRODISCECTOMY 1 LEVEL;  Surgeon: Erline Levine, MD;  Location: [place] NEURO ORS;  Service: Neurosurgery;  Laterality: N/A;  Thoracic ten-eleven laminectomy   LUMBAR LAMINECTOMY/DECOMPRESSION MICRODISCECTOMY Right 05/01/2014   Procedure: Right Lumbar three-four Microdiskectomy;  Surgeon: Erline Levine, MD;  Location: [place] NEURO ORS;  Service: Neurosurgery;  Laterality: Right;   LUMBAR LAMINECTOMY/DECOMPRESSION MICRODISCECTOMY Right 11/08/2014   Procedure: Right L4-5 L5-S1 Laminectomy;  Surgeon: Erline Levine, MD;  Location: [place] NEURO ORS;  Service: Neurosurgery;  Laterality: Right;  Right L4-5 L5-S1 Laminectomy   MULTIPLE TOOTH EXTRACTIONS      FAMILY HISTORY: Family History  Problem Relation Age of Onset   Cancer Mother    Stroke Father    Neuropathy Brother     SOCIAL HISTORY: Social History   Socioeconomic History   Marital status: Married    Spouse name: [name]   Number of children: 3   Years of education: 12   Highest education level: Not on file  Occupational History   Occupation: Retired  Tobacco Use   Smoking status: Former    Packs/day: 2.00    Years: 15.00    Pack years: 30.00    Types: Cigarettes    Quit date: 04/24/1984    Years since quitting: 37.4   Smokeless tobacco: Never  Substance and Sexual Activity   Alcohol use: Yes    Comment: occ   Drug use: No   Sexual activity: Not on file  Other Topics Concern   Not on file  Social History Narrative   Patient lives at home [place] his wife.    Patient has 3 adult children and 1 step son.    Patient has a high school education.    Patient is retired.    [place] [employer]    Social Determinants of Health   Financial Resource Strain: Not on file  Food Insecurity: Not on file  Transportation Needs: Not on file  Physical Activity: Not on file  Stress: Not on file  Social Connections:  Not on file  Intimate Partner Violence: Not on file   PHYSICAL EXAM  Vitals:   10/01/21 1356  BP:  103/65  Pulse: (!) 53  Weight: 181 lb (82.1 kg)  Height: '5\' 8"'  (1.727 m)    Body mass index is 27.52 kg/m.  Generalized: Well developed, in no acute distress  Neurological examination  Mentation: Alert oriented to time, place, history taking. Follows all commands speech and language fluent Cranial nerve II-XII: Pupils were equal round reactive to light. Extraocular movements were full, visual field were full on confrontational test. Facial sensation and strength were normal. Head turning and shoulder shrug  were normal and symmetric. Motor: The motor testing reveals 5 over 5 strength of all 4 extremities. Good symmetric motor tone is noted throughout.  Sensory: Stocking pattern sensory deficit to soft touch lower extremities Coordination: Cerebellar testing reveals good finger-nose-finger and heel-to-shin bilaterally.  Gait and station: Gait is normal Reflexes: Deep tendon reflexes are symmetric but decreased throughout  DIAGNOSTIC DATA (LABS, IMAGING, TESTING) - I reviewed patient records, labs, notes, testing and imaging myself where available.  Lab Results  Component Value Date   WBC 10.1 12/03/2019   HGB 12.8 (L) 12/03/2019   HCT 38.4 (L) 12/03/2019   MCV 92.3 12/03/2019   PLT 193 12/03/2019      Component Value Date/Time   NA 138 12/03/2019 0417   K 3.9 12/03/2019 0417   CL 108 12/03/2019 0417   CO2 23 12/03/2019 0417   GLUCOSE 210 (H) 12/03/2019 0417   BUN 14 12/03/2019 0417   CREATININE 0.97 12/03/2019 0417   CALCIUM 8.6 (L) 12/03/2019 0417   PROT 7.2 12/01/2019 2006   PROT 6.5 05/10/2019 0921   ALBUMIN 4.2 12/01/2019 2006   ALBUMIN 4.3 05/10/2019 0921   AST 20 12/01/2019 2006   ALT 52 (H) 12/01/2019 2006   ALKPHOS 128 (H) 12/01/2019 2006   BILITOT 1.7 (H) 12/01/2019 2006   BILITOT 0.8 05/10/2019 0921   GFRNONAA >60 12/03/2019 0417   GFRAA >60 12/03/2019 0417   Lab Results  Component Value Date   CHOL 126 12/20/2020   HDL 37 (L) 12/20/2020   LDLCALC 64  12/20/2020   TRIG 146 12/20/2020   CHOLHDL 3.4 12/20/2020   Lab Results  Component Value Date   HGBA1C 9.6 (H) 12/02/2019   No results found for: VITAMINB12 No results found for: TSH  ASSESSMENT AND PLAN 71 y.o. year old male  has a past medical history of Arthritis, BPH without obstruction/lower urinary tract symptoms, Bronchitis with asthma, acute (April 2013), CAD S/P percutaneous coronary angioplasty (11/15/2018), Depression, Frequency of urination, GERD (gastroesophageal reflux disease), Hyperlipidemia, Neuromuscular disorder ([place]), Neuropathy, No pertinent past medical history, NSTEMI (non-ST elevated myocardial infarction) ([place]) (11/13/2018), Recurrent upper respiratory infection (URI), and Shortness of breath. here with:  1.  Peripheral neuropathy 2.  Chronic low back pain  -Symptoms have remained overall stable on medications, in the past has tried to reduce the dose of his medicines but resulted in reported significant discomfort -Will continue current medications for now, will refer to pain clinic for ongoing management and medication administration -Return here on an as-needed basis  Continue gabapentin 800 mg, 4 times a day Continue gabapentin 100 mg capsule, 4 times a day, with 800 mg Continue Lyrica 100 mg twice a day  Continue Tofranil 150 mg at bedtime Continue Zonegran 100 mg capsule, 3 capsules at bedtime  Butler Denmark, AGNP-C, DNP 10/01/2021, 3:17 PM [facility] [address]  [place], [place] [address] [phone]

## 2021-10-02 ENCOUNTER — Other Ambulatory Visit: Payer: Self-pay | Admitting: Cardiovascular Disease

## 2021-10-10 ENCOUNTER — Encounter: Payer: Self-pay | Admitting: Physical Medicine and Rehabilitation

## 2021-11-10 DIAGNOSIS — N401 Enlarged prostate with lower urinary tract symptoms: Secondary | ICD-10-CM | POA: Diagnosis not present

## 2021-12-22 ENCOUNTER — Encounter: Payer: Medicare Other | Attending: Physical Medicine & Rehabilitation | Admitting: Physical Medicine & Rehabilitation

## 2021-12-22 ENCOUNTER — Encounter: Payer: Self-pay | Admitting: Physical Medicine & Rehabilitation

## 2021-12-22 VITALS — BP 122/79 | HR 64 | Ht 68.0 in | Wt 181.0 lb

## 2021-12-22 DIAGNOSIS — G609 Hereditary and idiopathic neuropathy, unspecified: Secondary | ICD-10-CM | POA: Diagnosis not present

## 2021-12-22 DIAGNOSIS — Z20822 Contact with and (suspected) exposure to covid-19: Secondary | ICD-10-CM | POA: Diagnosis not present

## 2021-12-22 NOTE — Progress Notes (Signed)
? ?Subjective:  ? ? Patient ID: Arthur FerronRoger A Hinderer, male    DOB: 12/05/1950, 71 y.o.   MRN: [id number] ? ?HPI ?Mr Arthur Smith is a 71 year old male with a past medical history of  diabetes mellitus type II coronary artery disease lumbar stenosis and idiopathic peripheral neuropathy.  Patient reports he has had a 20-year history of idiopathic peripheral neuropathy.  Neuropathy predated diabetes by many years as he was only diagnosed a few years ago with diabetes.  Reports EMG/NCS years ago showing neuropathy. Medications keep pain well controlled however when he does not take the medications he has a severe burning sensation in his feet. He is currently taking gabapentin, Lyrica, Tofranil, Zonegran. This combination of medications keeps the pain well controlled however if he attempts to decrease the dose of any of these medications the pain becomes difficult to tolerate.  Uses a cane for ambulation and denies any recent falls.  He currently takes gabapentin 800 mg 4 times a day Lyrica 100 mg twice a day Tofranil 150 mg at bedtime and Zonegran 300 mg at bedtime. He would like to decrease the number of medication needed to control his neuropathy. ? ?Pt here with his wife. ? ? ?Pain Inventory ?Average Pain 4 ?Pain Right Now 0 ?My pain is intermittent, constant, sharp, and tingling ? ?In the last 24 hours, has pain interfered with the following? ?General activity 10 ?Relation with others 10 ?Enjoyment of life 10 ?What TIME of day is your pain at its worst? morning , daytime, evening, night, and varies ?Sleep (in general) Good ? ?Pain is worse with: walking, sitting, standing, and some activites ?Pain improves with: rest and medication ?Relief from Meds: 10 ? ?walk without assistance ?how many minutes can you walk? 15 MINUTES ?ability to climb steps?  yes ?do you drive?  yes ?Do you have any goals in this area?  yes ? ?disabled: date disabled [id number] ?retired ?Do you have any goals in this area?  yes ? ?bladder control  problems ?numbness ?tingling ? ?Any changes since last visit?  no ? ?Any changes since last visit?  no ? ? ? ?Family History  ?Problem Relation Age of Onset  ? Cancer Mother   ? Stroke Father   ? Neuropathy Brother   ? ?Social History  ? ?Socioeconomic History  ? Marital status: Married  ?  Spouse name: Arthur Smith  ? Number of children: 3  ? Years of education: 612  ? Highest education level: Not on file  ?Occupational History  ? Occupation: Retired  ?Tobacco Use  ? Smoking status: Former  ?  Packs/day: 2.00  ?  Years: 15.00  ?  Pack years: 30.00  ?  Types: Cigarettes  ?  Quit date: 04/24/1984  ?  Years since quitting: 37.6  ? Smokeless tobacco: Never  ?Vaping Use  ? Vaping Use: Some days  ? Substances: CBD  ?Substance and Sexual Activity  ? Alcohol use: Yes  ?  Comment: occ  ? Drug use: No  ? Sexual activity: Not on file  ?Other Topics Concern  ? Not on file  ?Social History Narrative  ? Patient lives at home [name] his wife.   ? Patient has 3 adult children and 1 step son.   ? Patient has a high school education.   ? Patient is retired.   ? [employer]   ? ?Social Determinants of Health  ? ?Financial Resource Strain: Not on file  ?Food Insecurity: Not on file  ?Transportation Needs: Not on  file  ?Physical Activity: Not on file  ?Stress: Not on file  ?Social Connections: Not on file  ? ?Past Surgical History:  ?Procedure Laterality Date  ? CORONARY STENT INTERVENTION N/A 11/15/2018  ? Procedure: CORONARY STENT INTERVENTION;  Surgeon: Swaziland, Peter M, MD;  Location: [facility] INVASIVE CV LAB;;  Ost-pCx 95% (DES PCI  STENT SYNERGY DES 3.5X28) &p-mCx 100% (DES PCI STENT SYNERGY DES 2.25X32)  ? CORONARY STENT INTERVENTION N/A 11/16/2018  ? Procedure: CORONARY STENT INTERVENTION;  Surgeon: Lyn Records, MD;  Location: [facility] INVASIVE CV LAB;  Service: Cardiovascular;  Laterality: N/A;  ? FINGER SURGERY    ? RIGHT MIDDLE FINGER  ? HERNIA REPAIR    ? UMBILICAL HERNIA 2011  ? LEFT HEART CATH AND CORONARY ANGIOGRAPHY N/A 11/15/2018  ? Procedure:  LEFT HEART CATH AND CORONARY ANGIOGRAPHY;  Surgeon: Swaziland, Peter M, MD;  Location: [facility] INVASIVE CV LAB;; Severe 2 vessel obstructive CAD:. Ost-pCx 95% (DES PCI) &p-mCx 100% (DES PCI). pRCA 90%, mRCA 85% & rPDA 90%.dLAD ~40%.  EF 55 to 60%.  Moderately elevated LVEDP.  No R WMA.  -- Plan staged PCI to 3 RCA-PDA lesions 4/1  ? LUMBAR LAMINECTOMY/DECOMPRESSION MICRODISCECTOMY  12/22/2011  ? Procedure: LUMBAR LAMINECTOMY/DECOMPRESSION MICRODISCECTOMY 1 LEVEL;  Surgeon: Maeola Harman, MD;  Location: [facility];  Service: Neurosurgery;  Laterality: N/A;  Thoracic ten-eleven laminectomy  ? LUMBAR LAMINECTOMY/DECOMPRESSION MICRODISCECTOMY Right 05/01/2014  ? Procedure: Right Lumbar three-four Microdiskectomy;  Surgeon: Maeola Harman, MD;  Location: [facility] OR[facility];  Service: Neurosurgery;  Laterality: Right;  ? LUMBAR LAMINECTOMY/DECOMPRESSION MICRODISCECTOMY Right 11/08/2014  ? Procedure: Right L4-5 L5-S1 Laminectomy;  Surgeon: Maeola Harman, MD;  Location: [facility] NEUR[facility] ORS;  Service: Neurosurgery;  Laterality: Right;  Right L4-5 L5-S1 Laminectomy  ? MULTIPLE TOOTH EXTRACTIONS    ? ?Past Medical History:  ?Diagnosis Date  ? Arthritis   ? BPH without obstruction/lower urinary tract symptoms   ? Bronchitis with asthma, acute April 2013  ? CAD S/P percutaneous coronary angioplasty 11/15/2018  ? Severe 2 vessel obstructive CAD:. Ost-pCx 95% (DES PCI  STENT SYNERGY DES 3.5X28) &p-mCx 100% (DES PCI STENT SYNERGY DES 2.25X32). pRCA 90%, mRCA 85% & rPDA 90%.dLAD ~40%.  EF 55 to 60%.  Moderately elevated LVEDP.  No R WMA.  Plan staged PCI to 3 RCA-PDA lesions 4/1  ? Depression   ? denies  ? Frequency of urination   ? URINATION AT NIGHT, PAINFUL SLOW STREAM   ? GERD (gastroesophageal reflux disease)   ? occ  ? Hyperlipidemia   ? Neuromuscular disorder ([facility])   ? DIZZINESS   PERIPHERAL NEUROPATHY   ? Neuropathy   ? No pertinent past medical history   ? RINGING RIGHT EAR  ? NSTEMI (non-ST elevated myocardial infarction) ([facility]) 11/13/2018  ?  Recurrent upper respiratory infection (URI)   ? SOB CURRENT COLD  ? Shortness of breath   ? d/t "cold" 12/02/11  ? ?BP 122/79   Pulse 64   Ht 5\' 8"  (1.727 m)   Wt 181 lb (82.1 kg)   SpO2 98%   BMI 27.52 kg/m?  ? ?Opioid Risk Score:   ?Fall Risk Score:  `1 ? ?Depression screen PHQ 2/9 ? ?   ? View : No data to display.  ?  ?  ?  ? ? ?Review of Systems  ?Constitutional:  Negative for chills and fever.  ?Cardiovascular:  Negative for chest pain.  ?Gastrointestinal:  Negative for abdominal pain.  ?Genitourinary:   ?  BPH -bladder control  ?Musculoskeletal:  Positive for back pain and neck pain.  ?     Pain in lower legs & feet  ?Neurological:  Positive for numbness.  ?     Tingling  ?All other systems reviewed and are negative. ? ?   ?Objective:  ? Physical Exam ? ?General: Alert and oriented x 3, No apparent distress ?HEENT: Head is normocephalic, atraumatic, PERRLA, EOMI, sclera anicteric, oral mucosa pink and moist ?Neck: Supple without JVD or lymphadenopathy ?Heart: Reg rate and rhythm. No murmurs rubs or gallops ?Chest: CTA bilaterally without wheezes, rales, or rhonchi; no distress ?Abdomen: Soft, non-tender, non-distended, bowel sounds positive. ?Extremities: No clubbing, cyanosis ?Psych: Pt's affect is appropriate. Pt is cooperative. Very pleasant ?Skin: Clean and intact without signs of breakdown ?Neuro: Alert and oriented x3 follows commands cranial nerves grossly intact.  Stocking glove distribution sensory loss in bilateral lower extremities decreased deep tendon reflexes at the knees and absent reflexes at the ankles.  Deep tendon reflexes also decreased at the biceps triceps and brachioradialis.  Finger-to-nose normal.  No sensation to vibration at the great toe bilaterally ?Musculoskeletal: Strength 5 out of 5 throughout no joint swelling noted no tenderness noted to palpation.  No pain with ankle plantarflexion or dorsiflexion.  No pain with hip internal and external rotation. ? ? ? ? ?    ?Assessment & Plan:  ?Idiopathic primarily sensory peripheral neuropathy ?-Continue current therapy with gabapentin Lyrica Tofranil and Zonegran. ?-He is currently taking 800 mg gabapentin 4 times daily Lyrica 100 mg twice daily Tofrani

## 2021-12-28 ENCOUNTER — Other Ambulatory Visit: Payer: Self-pay | Admitting: Neurology

## 2021-12-29 NOTE — Telephone Encounter (Signed)
Verify Drug Registry For Pregabalin 100 Mg Capsule ?Last Filled: 10/01/2021 ?Quantity: 180 capsules for 90 days ?Last appointment: 10/01/2021 ?Next appointment: N/A ?

## 2022-01-02 DIAGNOSIS — Z86010 Personal history of colonic polyps: Secondary | ICD-10-CM | POA: Diagnosis not present

## 2022-01-02 DIAGNOSIS — I25118 Atherosclerotic heart disease of native coronary artery with other forms of angina pectoris: Secondary | ICD-10-CM | POA: Diagnosis not present

## 2022-01-02 DIAGNOSIS — Z1331 Encounter for screening for depression: Secondary | ICD-10-CM | POA: Diagnosis not present

## 2022-01-02 DIAGNOSIS — G629 Polyneuropathy, unspecified: Secondary | ICD-10-CM | POA: Diagnosis not present

## 2022-01-02 DIAGNOSIS — Z23 Encounter for immunization: Secondary | ICD-10-CM | POA: Diagnosis not present

## 2022-01-02 DIAGNOSIS — Z0000 Encounter for general adult medical examination without abnormal findings: Secondary | ICD-10-CM | POA: Diagnosis not present

## 2022-01-02 DIAGNOSIS — I10 Essential (primary) hypertension: Secondary | ICD-10-CM | POA: Diagnosis not present

## 2022-01-02 DIAGNOSIS — N401 Enlarged prostate with lower urinary tract symptoms: Secondary | ICD-10-CM | POA: Diagnosis not present

## 2022-01-02 DIAGNOSIS — K219 Gastro-esophageal reflux disease without esophagitis: Secondary | ICD-10-CM | POA: Diagnosis not present

## 2022-01-02 DIAGNOSIS — E1169 Type 2 diabetes mellitus with other specified complication: Secondary | ICD-10-CM | POA: Diagnosis not present

## 2022-01-08 ENCOUNTER — Encounter: Payer: Self-pay | Admitting: Physical Medicine & Rehabilitation

## 2022-01-08 ENCOUNTER — Encounter (HOSPITAL_BASED_OUTPATIENT_CLINIC_OR_DEPARTMENT_OTHER): Payer: Medicare Other | Admitting: Physical Medicine & Rehabilitation

## 2022-01-08 DIAGNOSIS — G609 Hereditary and idiopathic neuropathy, unspecified: Secondary | ICD-10-CM | POA: Diagnosis not present

## 2022-01-08 MED ORDER — GABAPENTIN 800 MG PO TABS: 800.0000 mg | ORAL_TABLET | Freq: Four times a day (QID) | ORAL | 1 refills | Status: DC

## 2022-01-08 NOTE — Progress Notes (Signed)
Subjective Patient presented today for his first Qutenza application.  He was noted today to have some small healing abrasions to his right foot around around the dorsum of his great toe and fifth digit.  Patient said these occurred when he was wearing some different shoes and now he is wearing less rigid looser healing shoes with improvement in these areas.  We discussed option to complete Qutenza on the left foot however patient agreed that we will hold off until these areas are completely healed and we can do Qutenza to both feet.  Of note patient also reports he ran out of his Lyrica and so decided to stop taking it.  He has continued the gabapentin in addition to the other medications.  Patient reports his pain did not significantly worsen with discontinuation of the Lyrica.  ROS: no weakness , continued numbness, no CP, SOB  Objective General: No apparent distress HEENT: Head is normocephalic, atraumatic Chest: normal effort Extremities: No clubbing, cyanosis, small healing abrasions on his first and fifth toe covered and Band-Aids areas appear to be nearly healed Psych: Pt's affect is appropriate. Pt is cooperative. Very pleasant Neuro: Altered and decreased sensation in bilateral lower extremities stocking glove distribution   Assessment and plan Idiopathic primarily sensory peripheral neuropathy -Continue current therapy -He is currently taking 800 mg gabapentin 4 times daily, Tofranil 150 mg at bedtime and Zonegran 300 mg at bedtime. -We will reschedule Qutenza's visit so he could complete treatment to both feet -Refill gabapentin -We not restart Lyrica since pain is under control, consider decreasing additional medications after next visit

## 2022-01-11 ENCOUNTER — Other Ambulatory Visit: Payer: Self-pay | Admitting: Neurology

## 2022-02-04 ENCOUNTER — Other Ambulatory Visit: Payer: Self-pay | Admitting: Neurology

## 2022-02-04 NOTE — Telephone Encounter (Signed)
Rx refilled.

## 2022-02-09 ENCOUNTER — Encounter: Payer: Self-pay | Admitting: Physical Medicine & Rehabilitation

## 2022-02-09 ENCOUNTER — Encounter: Payer: Medicare Other | Attending: Physical Medicine & Rehabilitation | Admitting: Physical Medicine & Rehabilitation

## 2022-02-09 VITALS — BP 122/75 | HR 55 | Ht 68.0 in | Wt 178.6 lb

## 2022-02-09 DIAGNOSIS — G609 Hereditary and idiopathic neuropathy, unspecified: Secondary | ICD-10-CM | POA: Insufficient documentation

## 2022-02-09 DIAGNOSIS — E1142 Type 2 diabetes mellitus with diabetic polyneuropathy: Secondary | ICD-10-CM | POA: Diagnosis not present

## 2022-02-09 NOTE — Progress Notes (Signed)
Subjective:    Patient ID: Arthur Smith, male    DOB: [birth year], 71 y.o.   MRN: [id number]  HPI Arthur Smith is a 71 year old male with a past medical history of  diabetes mellitus type II coronary artery disease lumbar stenosis and idiopathic peripheral neuropathy.  Patient reports he has had a 20-year history of idiopathic peripheral neuropathy.  Neuropathy predated diabetes by many years as he was only diagnosed a few years ago with diabetes.  Reports EMG/NCS years ago showing neuropathy. Medications keep pain well controlled however when he does not take the medications he has a severe burning sensation in his feet. He is currently taking gabapentin, Lyrica, Tofranil, Zonegran. This combination of medications keeps the pain well controlled however if he attempts to decrease the dose of any of these medications the pain becomes difficult to tolerate.  Uses a cane for ambulation and denies any recent falls.  He currently takes gabapentin 800 mg 4 times a day Lyrica 100 mg twice a day Tofranil 150 mg at bedtime and Zonegran 300 mg at bedtime. He would like to decrease the number of medication needed to control his neuropathy.    Interval History Patient is here for follow-up regarding his neuropathic pain.  His foot abrasions have healed.  He is wearing looser fitting shoes.  He would like to proceed with the Qutenza treatment.  He is no longer using Lyrica and pain continues to be stable.    Patient presented today for his first Qutenza application.  He was noted today to have some small healing abrasions to his right foot around around the dorsum of his great toe and fifth digit.  Patient said these occurred when he was wearing some different shoes and now he is wearing less rigid looser healing shoes with improvement in these areas.  We discussed option to complete Qutenza on the left foot however patient agreed that we will hold off until these areas are completely healed and we can do Qutenza  to both feet.  Of note patient also reports he ran out of his Lyrica and so decided to stop taking it.  He has continued the gabapentin in addition to the other medications.  Patient reports his pain did not significantly worsen with discontinuation of the Lyrica.  Any changes since last visit?  no  Any changes since last visit?  no    Family History  Problem Relation Age of Onset   Cancer Mother    Stroke Father    Neuropathy Brother    Social History   Socioeconomic History   Marital status: Married    Spouse name: Arthur Smith   Number of children: 3   Years of education: 12   Highest education level: Not on file  Occupational History   Occupation: Retired  Tobacco Use   Smoking status: Former    Packs/day: 2.00    Years: 15.00    Total pack years: 30.00    Types: Cigarettes    Quit date: 04/24/1984    Years since quitting: 37.8   Smokeless tobacco: Never  Vaping Use   Vaping Use: Some days   Substances: CBD  Substance and Sexual Activity   Alcohol use: Yes    Comment: occ   Drug use: No   Sexual activity: Not on file  Other Topics Concern   Not on file  Social History Narrative   Patient lives at home Arthur Smith his wife.    Patient has 3 adult children and 1  step son.    Patient has a high school education.    Patient is retired.    [employer] [employer]    Social Determinants of Health   Financial Resource Strain: Not on file  Food Insecurity: Not on file  Transportation Needs: Not on file  Physical Activity: Not on file  Stress: Not on file  Social Connections: Not on file   Past Surgical History:  Procedure Laterality Date   CORONARY STENT INTERVENTION N/A 11/15/2018   Procedure: CORONARY STENT INTERVENTION;  Surgeon: Swaziland, Peter M, MD;  Location: [facility] INVASIVE CV LAB;;  Ost-pCx 95% (DES PCI  STENT SYNERGY DES 3.5X28) &p-mCx 100% (DES PCI STENT SYNERGY DES 2.25X32)   CORONARY STENT INTERVENTION N/A 11/16/2018   Procedure: CORONARY STENT INTERVENTION;  Surgeon: Lyn Records,  MD;  Location: [facility] INVASIVE CV LAB;  Service: Cardiovascular;  Laterality: N/A;   FINGER SURGERY     RIGHT MIDDLE FINGER   HERNIA REPAIR     UMBILICAL HERNIA 2011   LEFT HEART CATH AND CORONARY ANGIOGRAPHY N/A 11/15/2018   Procedure: LEFT HEART CATH AND CORONARY ANGIOGRAPHY;  Surgeon: Swaziland, Peter M, MD;  Location: [facility] INVASIVE CV LAB;; Severe 2 vessel obstructive CAD:. Ost-pCx 95% (DES PCI) &p-mCx 100% (DES PCI). pRCA 90%, mRCA 85% & rPDA 90%.dLAD ~40%.  EF 55 to 60%.  Moderately elevated LVEDP.  No R WMA.  -- Plan staged PCI to 3 RCA-PDA lesions 4/1   LUMBAR LAMINECTOMY/DECOMPRESSION MICRODISCECTOMY  12/22/2011   Procedure: LUMBAR LAMINECTOMY/DECOMPRESSION MICRODISCECTOMY 1 LEVEL;  Surgeon: Maeola Harman, MD;  Location: [facility] NEURO ORS;  Service: Neurosurgery;  Laterality: N/A;  Thoracic ten-eleven laminectomy   LUMBAR LAMINECTOMY/DECOMPRESSION MICRODISCECTOMY Right 05/01/2014   Procedure: Right Lumbar three-four Microdiskectomy;  Surgeon: Maeola Harman, MD;  Location: [facility] NEURO ORS;  Service: Neurosurgery;  Laterality: Right;   LUMBAR LAMINECTOMY/DECOMPRESSION MICRODISCECTOMY Right 11/08/2014   Procedure: Right L4-5 L5-S1 Laminectomy;  Surgeon: Maeola Harman, MD;  Location: [facility] NEURO ORS;  Service: Neurosurgery;  Laterality: Right;  Right L4-5 L5-S1 Laminectomy   MULTIPLE TOOTH EXTRACTIONS     Past Medical History:  Diagnosis Date   Arthritis    BPH without obstruction/lower urinary tract symptoms    Bronchitis with asthma, acute April 2013   CAD S/P percutaneous coronary angioplasty 11/15/2018   Severe 2 vessel obstructive CAD:. Ost-pCx 95% (DES PCI  STENT SYNERGY DES 3.5X28) &p-mCx 100% (DES PCI STENT SYNERGY DES 2.25X32). pRCA 90%, mRCA 85% & rPDA 90%.dLAD ~40%.  EF 55 to 60%.  Moderately elevated LVEDP.  No R WMA.  Plan staged PCI to 3 RCA-PDA lesions 4/1   Depression    denies   Frequency of urination    URINATION AT NIGHT, PAINFUL SLOW STREAM    GERD (gastroesophageal reflux disease)    occ    Hyperlipidemia    Neuromuscular disorder (HCC)    DIZZINESS   PERIPHERAL NEUROPATHY    Neuropathy    No pertinent past medical history    RINGING RIGHT EAR   NSTEMI (non-ST elevated myocardial infarction) (HCC) 11/13/2018   Recurrent upper respiratory infection (URI)    SOB CURRENT COLD   Shortness of breath    d/t "cold" 12/02/11   BP 122/75   Pulse (!) 55   Ht 5\' 8"  (1.727 m)   Wt 81 kg   SpO2 98%   BMI 27.16 kg/m        02/09/2022    2:12 PM 12/22/2021    2:12 PM  Depression screen PHQ 2/9  Decreased Interest 0 0  Down, Depressed, Hopeless 0 0  PHQ - 2 Score 0 0  Altered sleeping  1  Tired, decreased energy  0  Change in appetite  0  Feeling bad or failure about yourself   0  Trouble concentrating  0  Moving slowly or fidgety/restless  0  Suicidal thoughts  0  PHQ-9 Score  1    Review of Systems  Constitutional:  Negative for chills and fever.  Cardiovascular:  Negative for chest pain.  Gastrointestinal:  Negative for abdominal pain.  Genitourinary:        BPH -bladder control  Musculoskeletal:  Positive for back pain and neck pain.       Pain in lower legs & feet  Neurological:  Positive for numbness.       Tingling  All other systems reviewed and are negative.      Objective:   Physical Exam  General: No apparent distress HEENT: Head is normocephalic, atraumatic Chest: normal effort, normal rate Extremities: No clubbing, cyanosis, small healing abrasions on his first and fifth toe covered and Band-Aids areas appear to be nearly healed Psych: Pt's affect is appropriate. Pt is cooperative. Very pleasant Neuro: Altered and decreased sensation in bilateral lower extremities stocking glove distribution        Assessment & Plan:  Idiopathic primarily sensory peripheral neuropathy, pt has DM and thus may have contribution to neuropathy from diabetes as well -Continue current therapy -He is currently taking 800 mg gabapentin 4 times daily, Tofranil 150  mg at bedtime and Zonegran 300 mg at bedtime. -1 patch of Qutenza was applied to the area of pain. Ice packs were applied during the procedure to ensure patient comfort. Blood pressure was monitored every 15 minutes. The patient tolerated the procedure well. Post-procedure instructions were given and follow-up has been scheduled.   -40 minute q33months Qutenza for diabetic peripheral neuropathy   40 minutes spent in discussion of risks and benefits of Qutenza and obtaining informed consent, discussion of q90 day follow-up and expectation of improvement in pain with each repeat application

## 2022-03-02 DIAGNOSIS — M5127 Other intervertebral disc displacement, lumbosacral region: Secondary | ICD-10-CM | POA: Diagnosis not present

## 2022-03-02 DIAGNOSIS — M48062 Spinal stenosis, lumbar region with neurogenic claudication: Secondary | ICD-10-CM | POA: Diagnosis not present

## 2022-03-02 DIAGNOSIS — Z6827 Body mass index (BMI) 27.0-27.9, adult: Secondary | ICD-10-CM | POA: Diagnosis not present

## 2022-03-04 DIAGNOSIS — M4722 Other spondylosis with radiculopathy, cervical region: Secondary | ICD-10-CM | POA: Diagnosis not present

## 2022-03-04 DIAGNOSIS — M5126 Other intervertebral disc displacement, lumbar region: Secondary | ICD-10-CM | POA: Diagnosis not present

## 2022-03-04 DIAGNOSIS — M48062 Spinal stenosis, lumbar region with neurogenic claudication: Secondary | ICD-10-CM | POA: Diagnosis not present

## 2022-03-04 DIAGNOSIS — M542 Cervicalgia: Secondary | ICD-10-CM | POA: Diagnosis not present

## 2022-03-04 DIAGNOSIS — M48061 Spinal stenosis, lumbar region without neurogenic claudication: Secondary | ICD-10-CM | POA: Diagnosis not present

## 2022-03-10 DIAGNOSIS — M48062 Spinal stenosis, lumbar region with neurogenic claudication: Secondary | ICD-10-CM | POA: Diagnosis not present

## 2022-03-10 DIAGNOSIS — M4722 Other spondylosis with radiculopathy, cervical region: Secondary | ICD-10-CM | POA: Diagnosis not present

## 2022-03-10 DIAGNOSIS — M5127 Other intervertebral disc displacement, lumbosacral region: Secondary | ICD-10-CM | POA: Diagnosis not present

## 2022-03-19 ENCOUNTER — Telehealth: Payer: Self-pay

## 2022-03-19 NOTE — Telephone Encounter (Signed)
Pt agreeable to in office appt 03/26/22 @ 1:55. We did not cancel the 05/2022 appt with Dr. Royann Shivers so pt may see MD for his yrly f/u.

## 2022-03-19 NOTE — Telephone Encounter (Signed)
   Pre-operative Risk Assessment    Patient Name: Arthur Smith  DOB: 01/11/1951 MRN: [id number]      Request for Surgical Clearance    Procedure:   L3-4, L4-5, L5-S1 Posterior Lumbar interbody fusion  Date of Surgery:  Clearance TBD                                 Surgeon:  Dr. Coletta Memos Surgeon's Group or Practice Name:  [facility] Phone number:  [phone] Fax number:  [phone]   Type of Clearance Requested:   - Medical  - Pharmacy:  Hold Clopidogrel (Plavix) please advise   Type of Anesthesia:  Not Indicated   Additional requests/questions:    Deforest Hoyles   03/19/2022, 4:20 PM

## 2022-03-19 NOTE — Telephone Encounter (Signed)
   Name: Arthur Smith  DOB: Mar 25, 1951  MRN: [id number]  Primary Cardiologist: Thurmon Fair, MD  Chart reviewed as part of pre-operative protocol coverage. Because of Reign A Berger's past medical history and time since last visit, he will require a follow-up in-office visit in order to better assess preoperative cardiovascular risk.  Pre-op covering staff: - Please schedule appointment and call patient to inform them. If patient already had an upcoming appointment within acceptable timeframe, please add "pre-op clearance" to the appointment notes so provider is aware. - Please contact requesting surgeon's office via preferred method (i.e, phone, fax) to inform them of need for appointment prior to surgery.    Napoleon Form, Leodis Rains, NP  03/19/2022, 4:30 PM

## 2022-03-26 ENCOUNTER — Encounter: Payer: Self-pay | Admitting: Nurse Practitioner

## 2022-03-26 ENCOUNTER — Ambulatory Visit (INDEPENDENT_AMBULATORY_CARE_PROVIDER_SITE_OTHER): Payer: Medicare Other | Admitting: Nurse Practitioner

## 2022-03-26 VITALS — BP 110/62 | HR 60 | Resp 20 | Ht 69.0 in | Wt 183.4 lb

## 2022-03-26 DIAGNOSIS — R001 Bradycardia, unspecified: Secondary | ICD-10-CM | POA: Diagnosis not present

## 2022-03-26 DIAGNOSIS — I2510 Atherosclerotic heart disease of native coronary artery without angina pectoris: Secondary | ICD-10-CM

## 2022-03-26 DIAGNOSIS — Z01810 Encounter for preprocedural cardiovascular examination: Secondary | ICD-10-CM | POA: Diagnosis not present

## 2022-03-26 DIAGNOSIS — E785 Hyperlipidemia, unspecified: Secondary | ICD-10-CM

## 2022-03-26 NOTE — Progress Notes (Signed)
Office Visit    Patient Name: Arthur Smith Date of Encounter: 03/26/2022  Primary Care Provider:  Lavone Orn, MD Primary Cardiologist:  Sanda Klein, MD  Chief Complaint    71 year old male with a history of CAD s/p NSTEMI, DES x4 (p Cx, d Cx, RCA and PDA) in 2020, hyperlipidemia, arthritis, BPH, and GERD who presents for follow-up related to CAD and for preoperative cardiac evaluation.   Past Medical History    Past Medical History:  Diagnosis Date   Arthritis    BPH without obstruction/lower urinary tract symptoms    Bronchitis with asthma, acute April 2013   CAD S/P percutaneous coronary angioplasty 11/15/2018   Severe 2 vessel obstructive CAD:. Ost-pCx 95% (DES PCI  STENT SYNERGY DES 3.5X28) &p-mCx 100% (DES PCI STENT SYNERGY DES 2.25X32). pRCA 90%, mRCA 85% & rPDA 90%.dLAD ~40%.  EF 55 to 60%.  Moderately elevated LVEDP.  No R WMA.  Plan staged PCI to 3 RCA-PDA lesions 4/1   Depression    denies   Frequency of urination    URINATION AT NIGHT, PAINFUL SLOW STREAM    GERD (gastroesophageal reflux disease)    occ   Hyperlipidemia    Neuromuscular disorder (HCC)    DIZZINESS   PERIPHERAL NEUROPATHY    Neuropathy    No pertinent past medical history    RINGING RIGHT EAR   NSTEMI (non-ST elevated myocardial infarction) ([place]) 11/13/2018   Recurrent upper respiratory infection (URI)    SOB CURRENT COLD   Shortness of breath    d/t "cold" 12/02/11   Past Surgical History:  Procedure Laterality Date   CORONARY STENT INTERVENTION N/A 11/15/2018   Procedure: CORONARY STENT INTERVENTION;  Surgeon: Martinique, Peter M, MD;  Location: [facility] INVASIVE CV LAB;;  Ost-pCx 95% (DES PCI  STENT SYNERGY DES 3.5X28) &p-mCx 100% (DES PCI STENT SYNERGY DES 2.25X32)   CORONARY STENT INTERVENTION N/A 11/16/2018   Procedure: CORONARY STENT INTERVENTION;  Surgeon: Belva Crome, MD;  Location: [place] CV LAB;  Service: Cardiovascular;  Laterality: N/A;   FINGER SURGERY     RIGHT MIDDLE  FINGER   HERNIA REPAIR     UMBILICAL HERNIA [id number]   LEFT HEART CATH AND CORONARY ANGIOGRAPHY N/A 11/15/2018   Procedure: LEFT HEART CATH AND CORONARY ANGIOGRAPHY;  Surgeon: Martinique, Peter M, MD;  Location: [place] CV LAB;; Severe 2 vessel obstructive CAD:. Ost-pCx 95% (DES PCI) &p-mCx 100% (DES PCI). pRCA 90%, mRCA 85% & rPDA 90%.dLAD ~40%.  EF 55 to 60%.  Moderately elevated LVEDP.  No R WMA.  -- Plan staged PCI to 3 RCA-PDA lesions 4/1   LUMBAR LAMINECTOMY/DECOMPRESSION MICRODISCECTOMY  12/22/2011   Procedure: LUMBAR LAMINECTOMY/DECOMPRESSION MICRODISCECTOMY 1 LEVEL;  Surgeon: Erline Levine, MD;  Location: [place] NEURO ORS;  Service: Neurosurgery;  Laterality: N/A;  Thoracic ten-eleven laminectomy   LUMBAR LAMINECTOMY/DECOMPRESSION MICRODISCECTOMY Right 05/01/2014   Procedure: Right Lumbar three-four Microdiskectomy;  Surgeon: Erline Levine, MD;  Location: [place] NEURO ORS;  Service: Neurosurgery;  Laterality: Right;   LUMBAR LAMINECTOMY/DECOMPRESSION MICRODISCECTOMY Right 11/08/2014   Procedure: Right L4-5 L5-S1 Laminectomy;  Surgeon: Erline Levine, MD;  Location: [place] NEURO ORS;  Service: Neurosurgery;  Laterality: Right;  Right L4-5 L5-S1 Laminectomy   MULTIPLE TOOTH EXTRACTIONS      Allergies  Allergies  Allergen Reactions   Eggs Or Egg-Derived Products Other (See Comments)    Egg yolks cause stomach cramps and diarrhea   Lactose Intolerance (Gi) Other (See Comments)    Causes stomach cramps and  diarrhea    History of Present Illness    71 year old male with the above past medical history including CAD s/p NSTEMI, DES x4 (p Cx, d Cx, RCA and PDA) in 2020, hyperlipidemia, arthritis, BPH, and GERD.  He was hospitalized in March 2020 in the setting of NSTEMI.  Cardiac catheterization revealed high-grade stenosis of both the left circumflex and RCA.  He underwent staged PCI with a total of DES x4 (p Cx, d Cx, RCA and PDA).  He was last seen in the office on 12/20/2020 and was doing well from a cardiac  standpoint.  His beta-blocker was switched from metoprolol tartrate to metoprolol succinate 12.5 mg daily in the setting of hypotension, bradycardia.    He presents today for follow-up accompanied by his wife and for preoperative cardiac evaluation for L3-4, L4-5, L5-S1 posterior lumbar anterior body fusion with Dr. Ashok Pall of [place] [facility] with request to hold Plavix prior to surgery. Since his last visit he has been stable from a cardiac standpoint.  He denies any symptoms concerning for angina.  Overall, he reports feeling well denies any new concerns today.  Home Medications    Current Outpatient Medications  Medication Sig Dispense Refill   albuterol (VENTOLIN HFA) 108 (90 Base) MCG/ACT inhaler INHALE 2 PUFFS BY MOUTH EVERY 4 HOURS AS NEEDED 19     aspirin EC 81 MG tablet Take 1 tablet (81 mg total) by mouth See admin instructions.     atorvastatin (LIPITOR) 80 MG tablet Take 1 tablet (80 mg total) by mouth at bedtime. 90 tablet 3   B Complex Vitamins (VITAMIN B COMPLEX) TABS 1 tablet     blood glucose meter kit and supplies Dispense based on patient and insurance preference. Use up to four times daily as directed. (FOR ICD-10 E10.9, E11.9). 1 each 0   cholecalciferol (VITAMIN D3) 25 MCG (1000 UT) tablet Take 1,000 Units by mouth daily at 6 PM.     clopidogrel (PLAVIX) 75 MG tablet Take 1 tablet (75 mg total) by mouth daily. 90 tablet 3   Cyanocobalamin 2500 MCG TABS Take 2,500 mcg by mouth daily at 6 PM. Vitamin B12     finasteride (PROSCAR) 5 MG tablet Take 5 mg by mouth daily.     fluticasone (FLONASE) 50 MCG/ACT nasal spray 2 sprays     gabapentin (NEURONTIN) 800 MG tablet Take 1 tablet (800 mg total) by mouth 4 (four) times daily. 360 tablet 1   imipramine (TOFRANIL-PM) 150 MG capsule TAKE 1 CAPSULE BY MOUTH AT BEDTIME. 90 capsule 3   metoprolol succinate (TOPROL-XL) 25 MG 24 hr tablet TAKE 1 TABLET (25 MG TOTAL) BY MOUTH DAILY. 90 tablet 1   nitroGLYCERIN  (NITROSTAT) 0.4 MG SL tablet Place 1 tablet (0.4 mg total) under the tongue every 5 (five) minutes as needed for chest pain. DON'T take within 24 hrs of Viagra 25 tablet 6   Omega-3 Fatty Acids (FISH OIL) 1000 MG CAPS Take 1,000 mg by mouth daily at 6 PM.     pantoprazole (PROTONIX) 40 MG tablet Take 40 mg by mouth daily.     senna-docusate (SENOKOT-S) 8.6-50 MG tablet Take 2 tablets by mouth at bedtime as needed (constipation).      tamsulosin (FLOMAX) 0.4 MG CAPS capsule Take 0.4 mg by mouth every 12 (twelve) hours.     zonisamide (ZONEGRAN) 100 MG capsule TAKE 3 AT NIGHT 270 capsule 0   No current facility-administered medications for this visit.  Review of Systems    He denies chest pain, palpitations, dyspnea, pnd, orthopnea, n, v, dizziness, syncope, edema, weight gain, or early satiety. All other systems reviewed and are otherwise negative except as noted above.   Physical Exam    VS:  BP 110/62   Pulse 60   Resp 20   Ht _0  (1.753 m)   Wt 183 lb 6.4 oz (83.2 kg)   SpO2 99%   BMI 27.08 kg/m   GEN: Well nourished, well developed, in no acute distress. HEENT: normal. Neck: Supple, no JVD, carotid bruits, or masses. Cardiac: RRR, no murmurs, rubs, or gallops. No clubbing, cyanosis, edema.  Radials/DP/PT 2+ and equal bilaterally.  Respiratory:  Respirations regular and unlabored, clear to auscultation bilaterally. GI: Soft, nontender, nondistended, BS + x 4. MS: no deformity or atrophy. Skin: warm and dry, no rash. Neuro:  Strength and sensation are intact. Psych: Normal affect.  Accessory Clinical Findings    ECG personally reviewed by me today -NSR, 60 bpm nonspecific ST/T wave changes- no acute changes.  Lab Results  Component Value Date   WBC 10.1 12/03/2019   HGB 12.8 (L) 12/03/2019   HCT 38.4 (L) 12/03/2019   MCV 92.3 12/03/2019   PLT 193 12/03/2019   Lab Results  Component Value Date   CREATININE 0.97 12/03/2019   BUN 14 12/03/2019   NA 138 12/03/2019    K 3.9 12/03/2019   CL 108 12/03/2019   CO2 23 12/03/2019   Lab Results  Component Value Date   ALT 52 (H) 12/01/2019   AST 20 12/01/2019   ALKPHOS 128 (H) 12/01/2019   BILITOT 1.7 (H) 12/01/2019   Lab Results  Component Value Date   CHOL 126 12/20/2020   HDL 37 (L) 12/20/2020   LDLCALC 64 12/20/2020   TRIG 146 12/20/2020   CHOLHDL 3.4 12/20/2020    Lab Results  Component Value Date   HGBA1C 9.6 (H) 12/02/2019    Assessment & Plan    1. CAD: S/p DES x4 (p Cx, d Cx, RCA and PDA) in 2020. Stable with no anginal symptoms. No indication for ischemic evaluation.  Continue aspirin, Plavix, metoprolol, Lipitor.  2. Hyperlipidemia: LDL was 67 in 12/2021.  Continue Lipitor.  3. Bradycardia: History of bradycardia.  EKG today shows NSR, 60 bpm.  Stable, continue metoprolol.  4. Preoperative cardiac exam: According to the Revised Cardiac Risk Index (RCRI), his Perioperative Risk of Major Cardiac Event is (%): 0.9. His Functional Capacity in METs is: 7.34 according to the Duke Activity Status Index (DASI). Therefore, based on ACC/AHA guidelines, patient would be at acceptable risk for the planned procedure without further cardiovascular testing.  Our office protocol, he may hold his Plavix for 5 days prior to procedure.  Please resume Plavix as soon as possible postprocedure, the discretion of the surgeon.  I will route this recommendation to the requesting party via Epic fax function.  5. Disposition: Follow-up in 1 year, sooner if needed.   Lenna Sciara, NP 03/26/2022, 4:50 PM

## 2022-03-26 NOTE — Patient Instructions (Signed)
Medication Instructions:  Your physician recommends that you continue on your current medications as directed. Please refer to the Current Medication list given to you today.   *If you need a refill on your cardiac medications before your next appointment, please call your pharmacy*   Lab Work: NONE ordered at this time of appointment   If you have labs (blood work) drawn today and your tests are completely normal, you will receive your results only by: MyChart Message (if you have MyChart) OR A paper copy in the mail If you have any lab test that is abnormal or we need to change your treatment, we will call you to review the results.   Testing/Procedures: NONE ordered at this time of appointment     Follow-Up: At [facility], you and your health needs are our priority.  As part of our continuing mission to provide you with exceptional heart care, we have created designated Provider Care Teams.  These Care Teams include your primary Cardiologist (physician) and Advanced Practice Providers (APPs -  Physician Assistants and Nurse Practitioners) who all work together to provide you with the care you need, when you need it.  We recommend signing up for the patient portal called "MyChart".  Sign up information is provided on this After Visit Summary.  MyChart is used to connect with patients for Virtual Visits (Telemedicine).  Patients are able to view lab/test results, encounter notes, upcoming appointments, etc.  Non-urgent messages can be sent to your provider as well.   To learn more about what you can do with MyChart, go to [email or web address].    Your next appointment:   1 year(s)  The format for your next appointment:   In Person  Provider:   Mihai Croitoru, MD     Other Instructions   Important Information About Sugar       

## 2022-03-27 ENCOUNTER — Other Ambulatory Visit (INDEPENDENT_AMBULATORY_CARE_PROVIDER_SITE_OTHER): Payer: 59

## 2022-03-27 DIAGNOSIS — I2510 Atherosclerotic heart disease of native coronary artery without angina pectoris: Secondary | ICD-10-CM

## 2022-03-30 ENCOUNTER — Encounter: Payer: Self-pay | Admitting: Physical Medicine & Rehabilitation

## 2022-03-30 ENCOUNTER — Encounter: Payer: Medicare Other | Attending: Physical Medicine & Rehabilitation | Admitting: Physical Medicine & Rehabilitation

## 2022-03-30 VITALS — BP 107/75 | HR 74 | Ht 69.0 in | Wt 183.8 lb

## 2022-03-30 DIAGNOSIS — G609 Hereditary and idiopathic neuropathy, unspecified: Secondary | ICD-10-CM | POA: Diagnosis not present

## 2022-03-30 NOTE — Progress Notes (Unsigned)
Subjective:    Patient ID: Arthur Smith, male    DOB: August 08, 1951, 71 y.o.   MRN: [id number]  HPI  HPI 02/09/22 Arthur Smith is a 71 year old male with a past medical history of  diabetes mellitus type II coronary artery disease lumbar stenosis and idiopathic peripheral neuropathy.  Patient reports he has had a 20-year history of idiopathic peripheral neuropathy.  Neuropathy predated diabetes by many years as he was only diagnosed a few years ago with diabetes.  Reports EMG/NCS years ago showing neuropathy. Medications keep pain well controlled however when he does not take the medications he has a severe burning sensation in his feet. He is currently taking gabapentin, Lyrica, Tofranil, Zonegran. This combination of medications keeps the pain well controlled however if he attempts to decrease the dose of any of these medications the pain becomes difficult to tolerate.  Uses a cane for ambulation and denies any recent falls.  He currently takes gabapentin 800 mg 4 times a day Lyrica 100 mg twice a day Tofranil 150 mg at bedtime and Zonegran 300 mg at bedtime. He would like to decrease the number of medication needed to control his neuropathy.    Interval History Arthur Smith is here for follow-up of his chronic peripheral polyneuropathy.  Patient reports Qutenza provided improvement in his peripheral neuropathy pain.  He denies any side effects with the treatment and reports he had minimal pain after the procedure.  Patient says is pain is well controlled when using gabapentin 800 mg 4 times a day , Tofranil 150 mg at bedtime and Zonegran 300 mg.  Patient says when he misses a dose of these medications his pain becomes much more severe and uncomfortable.  Patient would like to continue with Qutenza treatment.  He is planning to have additional lumbar surgery completed by  Dr. Charlsie Merles. He reports he has had several spinal surgeries in the past. He says he had a MRI L spine completed by neurosurgery and  reports he will be scheduled for surgery soon. He says PT did not help his pain. He reports pain shooting down his legs worse on the left. He is not interested in medication or interventional treatments for his back pain at this time and would like to follow with neurosurgery for this issue.   Pain Inventory Average Pain 4 Pain Right Now 0 My pain is burning  In the last 24 hours, has pain interfered with the following? General activity 1 Relation with others 1 Enjoyment of life 1 What TIME of day is your pain at its worst? night Sleep (in general) Fair  Pain is worse with: walking, sitting, and standing Pain improves with: rest, therapy/exercise, and medication Relief from Meds: 10  Family History  Problem Relation Age of Onset   Cancer Mother    Stroke Father    Neuropathy Brother    Social History   Socioeconomic History   Marital status: Married    Spouse name: [name]   Number of children: 3   Years of education: 12   Highest education level: Not on file  Occupational History   Occupation: Retired  Tobacco Use   Smoking status: Former    Packs/day: 2.00    Years: 15.00    Total pack years: 30.00    Types: Cigarettes    Quit date: 04/24/1984    Years since quitting: 37.9   Smokeless tobacco: Never  Vaping Use   Vaping Use: Some days   Substances: CBD  Substance and  Sexual Activity   Alcohol use: Yes    Comment: occ   Drug use: No   Sexual activity: Not on file  Other Topics Concern   Not on file  Social History Narrative   Patient lives at home [name] his wife.    Patient has 3 adult children and 1 step son.    Patient has a high school education.    Patient is retired.    [employer] [employer]    Social Determinants of Health   Financial Resource Strain: Not on file  Food Insecurity: Not on file  Transportation Needs: Not on file  Physical Activity: Not on file  Stress: Not on file  Social Connections: Not on file   Past Surgical History:  Procedure Laterality  Date   CORONARY STENT INTERVENTION N/A 11/15/2018   Procedure: CORONARY STENT INTERVENTION;  Surgeon: Swaziland, Peter M, MD;  Location: [facility] INVASIVE CV LAB;;  Ost-pCx 95% (DES PCI  STENT SYNERGY DES 3.5X28) &p-mCx 100% (DES PCI STENT SYNERGY DES 2.25X32)   CORONARY STENT INTERVENTION N/A 11/16/2018   Procedure: CORONARY STENT INTERVENTION;  Surgeon: Lyn Records, MD;  Location: [facility] INVASIVE CV LAB;  Service: Cardiovascular;  Laterality: N/A;   FINGER SURGERY     RIGHT MIDDLE FINGER   HERNIA REPAIR     UMBILICAL HERNIA 2011   LEFT HEART CATH AND CORONARY ANGIOGRAPHY N/A 11/15/2018   Procedure: LEFT HEART CATH AND CORONARY ANGIOGRAPHY;  Surgeon: Swaziland, Peter M, MD;  Location: [facility] INVASIVE CV LAB;; Severe 2 vessel obstructive CAD:. Ost-pCx 95% (DES PCI) &p-mCx 100% (DES PCI). pRCA 90%, mRCA 85% & rPDA 90%.dLAD ~40%.  EF 55 to 60%.  Moderately elevated LVEDP.  No R WMA.  -- Plan staged PCI to 3 RCA-PDA lesions 4/1   LUMBAR LAMINECTOMY/DECOMPRESSION MICRODISCECTOMY  12/22/2011   Procedure: LUMBAR LAMINECTOMY/DECOMPRESSION MICRODISCECTOMY 1 LEVEL;  Surgeon: Maeola Harman, MD;  Location: [facility] NEURO ORS;  Service: Neurosurgery;  Laterality: N/A;  Thoracic ten-eleven laminectomy   LUMBAR LAMINECTOMY/DECOMPRESSION MICRODISCECTOMY Right 05/01/2014   Procedure: Right Lumbar three-four Microdiskectomy;  Surgeon: Maeola Harman, MD;  Location: [facility] NEURO ORS;  Service: Neurosurgery;  Laterality: Right;   LUMBAR LAMINECTOMY/DECOMPRESSION MICRODISCECTOMY Right 11/08/2014   Procedure: Right L4-5 L5-S1 Laminectomy;  Surgeon: Maeola Harman, MD;  Location: [facility] NEURO ORS;  Service: Neurosurgery;  Laterality: Right;  Right L4-5 L5-S1 Laminectomy   MULTIPLE TOOTH EXTRACTIONS     Past Surgical History:  Procedure Laterality Date   CORONARY STENT INTERVENTION N/A 11/15/2018   Procedure: CORONARY STENT INTERVENTION;  Surgeon: Swaziland, Peter M, MD;  Location: [facility] INVASIVE CV LAB;;  Ost-pCx 95% (DES PCI  STENT SYNERGY DES 3.5X28) &p-mCx 100%  (DES PCI STENT SYNERGY DES 2.25X32)   CORONARY STENT INTERVENTION N/A 11/16/2018   Procedure: CORONARY STENT INTERVENTION;  Surgeon: Lyn Records, MD;  Location: [facility] INVASIVE CV LAB;  Service: Cardiovascular;  Laterality: N/A;   FINGER SURGERY     RIGHT MIDDLE FINGER   HERNIA REPAIR     UMBILICAL HERNIA 2011   LEFT HEART CATH AND CORONARY ANGIOGRAPHY N/A 11/15/2018   Procedure: LEFT HEART CATH AND CORONARY ANGIOGRAPHY;  Surgeon: Swaziland, Peter M, MD;  Location: [facility] INVASIVE CV LAB;; Severe 2 vessel obstructive CAD:. Ost-pCx 95% (DES PCI) &p-mCx 100% (DES PCI). pRCA 90%, mRCA 85% & rPDA 90%.dLAD ~40%.  EF 55 to 60%.  Moderately elevated LVEDP.  No R WMA.  -- Plan staged PCI to 3 RCA-PDA lesions 4/1   LUMBAR LAMINECTOMY/DECOMPRESSION MICRODISCECTOMY  12/22/2011  Procedure: LUMBAR LAMINECTOMY/DECOMPRESSION MICRODISCECTOMY 1 LEVEL;  Surgeon: Maeola Harman, MD;  Location: [facility] NEURO ORS;  Service: Neurosurgery;  Laterality: N/A;  Thoracic ten-eleven laminectomy   LUMBAR LAMINECTOMY/DECOMPRESSION MICRODISCECTOMY Right 05/01/2014   Procedure: Right Lumbar three-four Microdiskectomy;  Surgeon: Maeola Harman, MD;  Location: [facility] NEURO ORS;  Service: Neurosurgery;  Laterality: Right;   LUMBAR LAMINECTOMY/DECOMPRESSION MICRODISCECTOMY Right 11/08/2014   Procedure: Right L4-5 L5-S1 Laminectomy;  Surgeon: Maeola Harman, MD;  Location: [facility] NEURO ORS;  Service: Neurosurgery;  Laterality: Right;  Right L4-5 L5-S1 Laminectomy   MULTIPLE TOOTH EXTRACTIONS     Past Medical History:  Diagnosis Date   Arthritis    BPH without obstruction/lower urinary tract symptoms    Bronchitis with asthma, acute April 2013   CAD S/P percutaneous coronary angioplasty 11/15/2018   Severe 2 vessel obstructive CAD:. Ost-pCx 95% (DES PCI  STENT SYNERGY DES 3.5X28) &p-mCx 100% (DES PCI STENT SYNERGY DES 2.25X32). pRCA 90%, mRCA 85% & rPDA 90%.dLAD ~40%.  EF 55 to 60%.  Moderately elevated LVEDP.  No R WMA.  Plan staged PCI to 3 RCA-PDA lesions 4/1    Depression    denies   Frequency of urination    URINATION AT NIGHT, PAINFUL SLOW STREAM    GERD (gastroesophageal reflux disease)    occ   Hyperlipidemia    Neuromuscular disorder (HCC)    DIZZINESS   PERIPHERAL NEUROPATHY    Neuropathy    No pertinent past medical history    RINGING RIGHT EAR   NSTEMI (non-ST elevated myocardial infarction) (HCC) 11/13/2018   Recurrent upper respiratory infection (URI)    SOB CURRENT COLD   Shortness of breath    d/t "cold" 12/02/11   BP 107/75   Pulse 74   Ht 5\' 9"  (1.753 m)   Wt 183 lb 12.8 oz (83.4 kg)   SpO2 98%   BMI 27.14 kg/m   Opioid Risk Score:   Fall Risk Score:  `1  Depression screen PHQ 2/9     03/30/2022    2:12 PM 02/09/2022    2:12 PM 12/22/2021    2:12 PM  Depression screen PHQ 2/9  Decreased Interest 0 0 0  Down, Depressed, Hopeless 0 0 0  PHQ - 2 Score 0 0 0  Altered sleeping   1  Tired, decreased energy   0  Change in appetite   0  Feeling bad or failure about yourself    0  Trouble concentrating   0  Moving slowly or fidgety/restless   0  Suicidal thoughts   0  PHQ-9 Score   1      Review of Systems  Musculoskeletal:  Positive for gait problem.  All other systems reviewed and are negative.     Objective:   Physical Exam  Gen: no distress, normal appearing HEENT: oral mucosa pink and moist, NCAT Cardio: Reg rate Chest: normal effort, normal rate of breathing Abd: soft, non-distended Ext: no edema Psych: pleasant, normal affect Skin: intact Neuro: Alert and oriented x3, follows commands, CN 2-12 grossly intact Decreased sensation in b/l LE to light touch Musculoskeletal: Strength 5 out of 5 throughout  Slump test neg bilaterally Mild lumbar paraspinal tenderness to palpation      Assessment & Plan:   Idiopathic primarily sensory peripheral neuropathy -Continue current regimen of 800 mg gabapentin 4 times daily,Tofranil 150 mg at bedtime and Zonegran 300 mg at bedtime. -Continue Qutenza  treatments, pt reports this provides benefit to his pain, if he has continued  benefit with additional treatments may consider trial of decreasing oral medications

## 2022-04-02 ENCOUNTER — Other Ambulatory Visit: Payer: Self-pay | Admitting: Cardiovascular Disease

## 2022-04-07 ENCOUNTER — Other Ambulatory Visit: Payer: Self-pay | Admitting: Neurology

## 2022-04-07 ENCOUNTER — Other Ambulatory Visit: Payer: Self-pay | Admitting: Neurosurgery

## 2022-04-16 NOTE — Pre-Procedure Instructions (Signed)
Surgical Instructions    Your procedure is scheduled on Friday September 8.  Report to [facility] Main Entrance "A" at 6:30 A.M., then check in with the Admitting office.  Call this number if you have problems the morning of surgery:  [phone]   If you have any questions prior to your surgery date call [phone]: Open Monday-Friday 8am-4pm    Remember:  Do not eat after midnight the night before your surgery  You may drink clear liquids until 5:30 the morning of your surgery.   Clear liquids allowed are: Water, Non-Citrus Juices (without pulp), Carbonated Beverages, Clear Tea, Black Coffee ONLY (NO MILK, CREAM OR POWDERED CREAMER of any kind), and Gatorade    Take these medicines the morning of surgery with A SIP OF WATER:  finasteride (PROSCAR)  gabapentin (NEURONTIN) metoprolol succinate (TOPROL-XL) pantoprazole (PROTONIX) tamsulosin (FLOMAX)  IF NEEDED: albuterol (VENTOLIN HFA)  fluticasone (FLONASE) nitroGLYCERIN (NITROSTAT)  HOW TO MANAGE YOUR DIABETES BEFORE AND AFTER SURGERY  Why is it important to control my blood sugar before and after surgery? Improving blood sugar levels before and after surgery helps healing and can limit problems. A way of improving blood sugar control is eating a healthy diet by:  Eating less sugar and carbohydrates  Increasing activity/exercise  Talking with your doctor about reaching your blood sugar goals High blood sugars (greater than 180 mg/dL) can raise your risk of infections and slow your recovery, so you will need to focus on controlling your diabetes during the weeks before surgery. Make sure that the doctor who takes care of your diabetes knows about your planned surgery including the date and location.  How do I manage my blood sugar before surgery? Check your blood sugar at least 4 times a day, starting 2 days before surgery, to make sure that the level is not too high or low.  Check your blood sugar the morning of your  surgery when you wake up and every 2 hours until you get to the Short Stay unit.  If your blood sugar is less than 70 mg/dL, you will need to treat for low blood sugar: Do not take insulin. Treat a low blood sugar (less than 70 mg/dL) with  cup of clear juice (cranberry or apple), 4 glucose tablets, OR glucose gel. Recheck blood sugar in 15 minutes after treatment (to make sure it is greater than 70 mg/dL). If your blood sugar is not greater than 70 mg/dL on recheck, call [phone] for further instructions. Report your blood sugar to the short stay nurse when you get to Short Stay.  If you are admitted to the hospital after surgery: Your blood sugar will be checked by the staff and you will probably be given insulin after surgery (instead of oral diabetes medicines) to make sure you have good blood sugar levels. The goal for blood sugar control after surgery is 80-180 mg/dL.   Follow your surgeon's instructions on when to stop Asprin and clopidogrel (PLAVIX) .  If no instructions were given by your surgeon then you will need to call the office to get those instructions.    As of today, STOP taking any Aleve, Naproxen, Ibuprofen, Motrin, Advil, Goody's, BC's, all herbal medications, fish oil, and all vitamins.           Do not wear jewelry or makeup. Do not wear lotions, powders, cologne or deodorant. Men may shave face and neck. Do not bring valuables to the hospital. Do not wear nail polish, gel polish, artificial  nails, or any other type of covering on natural nails (fingers and toes) If you have artificial nails or gel coating that need to be removed by a nail salon, please have this removed prior to surgery. Artificial nails or gel coating may interfere with anesthesia's ability to adequately monitor your vital signs.  [facility] is not responsible for any belongings or valuables.    Do NOT Smoke (Tobacco/Vaping)  24 hours prior to your procedure  If you use a CPAP at night, you  may bring your mask for your overnight stay.   Contacts, glasses, hearing aids, dentures or partials may not be worn into surgery, please bring cases for these belongings   For patients admitted to the hospital, discharge time will be determined by your treatment team.   Patients discharged the day of surgery will not be allowed to drive home, and someone needs to stay with them for 24 hours.   SURGICAL WAITING ROOM VISITATION Patients having surgery or a procedure may have no more than 2 support people in the waiting area - these visitors may rotate.   Children under the age of 39 must have an adult with them who is not the patient. If the patient needs to stay at the hospital during part of their recovery, the visitor guidelines for inpatient rooms apply. Pre-op nurse will coordinate an appropriate time for 1 support person to accompany patient in pre-op.  This support person may not rotate.   Please refer to the [facility] website for the visitor guidelines for Inpatients (after your surgery is over and you are in a regular room).    Special instructions:    Oral Hygiene is also important to reduce your risk of infection.  Remember - BRUSH YOUR TEETH THE MORNING OF SURGERY WITH YOUR REGULAR TOOTHPASTE   [facility]- Preparing For Surgery  Before surgery, you can play an important role. Because skin is not sterile, your skin needs to be as free of germs as possible. You can reduce the number of germs on your skin by washing with CHG (chlorahexidine gluconate) Soap before surgery.  CHG is an antiseptic cleaner which kills germs and bonds with the skin to continue killing germs even after washing.     Please do not use if you have an allergy to CHG or antibacterial soaps. If your skin becomes reddened/irritated stop using the CHG.  Do not shave (including legs and underarms) for at least 48 hours prior to first CHG shower. It is OK to shave your face.  Please follow these instructions  carefully.     Shower the NIGHT BEFORE SURGERY and the MORNING OF SURGERY with CHG Soap.   If you chose to wash your hair, wash your hair first as usual with your normal shampoo. After you shampoo, rinse your hair and body thoroughly to remove the shampoo.  Then [employer] and genitals (private parts) with your normal soap and rinse thoroughly to remove soap.  After that Use CHG Soap as you would any other liquid soap. You can apply CHG directly to the skin and wash gently with a scrungie or a clean washcloth.   Apply the CHG Soap to your body ONLY FROM THE NECK DOWN.  Do not use on open wounds or open sores. Avoid contact with your eyes, ears, mouth and genitals (private parts). Wash Face and genitals (private parts)  with your normal soap.   Wash thoroughly, paying special attention to the area where your surgery will be  performed.  Thoroughly rinse your body with warm water from the neck down.  DO NOT shower/wash with your normal soap after using and rinsing off the CHG Soap.  Pat yourself dry with a CLEAN TOWEL.  Wear CLEAN PAJAMAS to bed the night before surgery  Place CLEAN SHEETS on your bed the night before your surgery  DO NOT SLEEP WITH PETS.   Day of Surgery:  Take a shower with CHG soap. Wear Clean/Comfortable clothing the morning of surgery Do not apply any deodorants/lotions.   Remember to brush your teeth WITH YOUR REGULAR TOOTHPASTE.    If you received a COVID test during your pre-op visit, it is requested that you wear a mask when out in public, stay away from anyone that may not be feeling well, and notify your surgeon if you develop symptoms. If you have been in contact with anyone that has tested positive in the last 10 days, please notify your surgeon.    Please read over the following fact sheets that you were given.

## 2022-04-17 ENCOUNTER — Encounter (HOSPITAL_COMMUNITY): Payer: Self-pay

## 2022-04-17 ENCOUNTER — Encounter (HOSPITAL_COMMUNITY)
Admission: RE | Admit: 2022-04-17 | Discharge: 2022-04-17 | Disposition: A | Discharge status: Home / Self Care | Payer: Medicare Other | Source: Ambulatory Visit | Attending: Neurosurgery | Admitting: Neurosurgery

## 2022-04-17 ENCOUNTER — Other Ambulatory Visit: Payer: Self-pay

## 2022-04-17 VITALS — BP 111/80 | HR 54 | Temp 98.1°F | Resp 17 | Ht 68.0 in | Wt 190.6 lb

## 2022-04-17 DIAGNOSIS — N400 Enlarged prostate without lower urinary tract symptoms: Secondary | ICD-10-CM | POA: Diagnosis not present

## 2022-04-17 DIAGNOSIS — I252 Old myocardial infarction: Secondary | ICD-10-CM | POA: Insufficient documentation

## 2022-04-17 DIAGNOSIS — E119 Type 2 diabetes mellitus without complications: Secondary | ICD-10-CM

## 2022-04-17 DIAGNOSIS — I2510 Atherosclerotic heart disease of native coronary artery without angina pectoris: Secondary | ICD-10-CM | POA: Diagnosis not present

## 2022-04-17 DIAGNOSIS — M48062 Spinal stenosis, lumbar region with neurogenic claudication: Secondary | ICD-10-CM | POA: Diagnosis not present

## 2022-04-17 DIAGNOSIS — Z01812 Encounter for preprocedural laboratory examination: Secondary | ICD-10-CM | POA: Diagnosis not present

## 2022-04-17 DIAGNOSIS — E785 Hyperlipidemia, unspecified: Secondary | ICD-10-CM | POA: Diagnosis not present

## 2022-04-17 DIAGNOSIS — R0600 Dyspnea, unspecified: Secondary | ICD-10-CM | POA: Insufficient documentation

## 2022-04-17 DIAGNOSIS — Z01818 Encounter for other preprocedural examination: Secondary | ICD-10-CM

## 2022-04-17 DIAGNOSIS — Z87891 Personal history of nicotine dependence: Secondary | ICD-10-CM | POA: Insufficient documentation

## 2022-04-17 DIAGNOSIS — K219 Gastro-esophageal reflux disease without esophagitis: Secondary | ICD-10-CM | POA: Diagnosis not present

## 2022-04-17 HISTORY — DX: Type 2 diabetes mellitus without complications: E11.9

## 2022-04-17 LAB — CBC
HCT: 42.1 % (ref 39.0–52.0)
Hemoglobin: 14.1 g/dL (ref 13.0–17.0)
MCH: 31.7 pg (ref 26.0–34.0)
MCHC: 33.5 g/dL (ref 30.0–36.0)
MCV: 94.6 fL (ref 80.0–100.0)
Platelets: 251 10*3/uL (ref 150–400)
RBC: 4.45 MIL/uL (ref 4.22–5.81)
RDW: 13.3 % (ref 11.5–15.5)
WBC: 8.4 10*3/uL (ref 4.0–10.5)
nRBC: 0 % (ref 0.0–0.2)

## 2022-04-17 LAB — BASIC METABOLIC PANEL
Anion gap: 5 (ref 5–15)
BUN: 26 mg/dL — ABNORMAL HIGH (ref 8–23)
CO2: 26 mmol/L (ref 22–32)
Calcium: 8.6 mg/dL — ABNORMAL LOW (ref 8.9–10.3)
Chloride: 107 mmol/L (ref 98–111)
Creatinine, Ser: 1.12 mg/dL (ref 0.61–1.24)
GFR, Estimated: >60 mL/min (ref >60)
Glucose, Bld: 109 mg/dL — ABNORMAL HIGH (ref 70–99)
Potassium: 4.3 mmol/L (ref 3.5–5.1)
Sodium: 138 mmol/L (ref 135–145)

## 2022-04-17 LAB — HEMOGLOBIN A1C
Hgb A1c MFr Bld: 5.9 % — ABNORMAL HIGH (ref 4.8–5.6)
Mean Plasma Glucose: 122.63 mg/dL

## 2022-04-17 LAB — SURGICAL PCR SCREEN
MRSA, PCR: NEGATIVE
Staphylococcus aureus: POSITIVE — AB

## 2022-04-17 NOTE — Progress Notes (Signed)
PCP - Dr. Roseanne Reno  Cardiologist - Dr. Judie Petit. Croitoru  EP- Denies  Endocrine- Denies  Pulm- Denies  Chest x-ray - Denies  EKG - 03/26/22 (E)  Stress Test - Denies  ECHO - Denies  Cardiac Cath - 11/15/2018 (E)  AICD-na PM-na LOOP-na  Nerve Stimulator- Denies  Dialysis- Denies  Sleep Study - Denies CPAP - Denies  LABS- 04/17/22: CBC, BMP, T/S, PCR  ASA-LD- 9/2 PLAVIX-LD- 9/2  ERAS- Yes- clears until 0530  HA1C- 04/17/22- The pt is not required to check his levels at home, but he does own a meter. The pt advised to check his levels the morning of surgery and follow the instructions given.  Anesthesia- Yes- EKG  Pt denies having chest pain, sob, or fever at this time. All instructions explained to the pt, with a verbal understanding of the material. Pt agrees to go over the instructions while at home for a better understanding. Pt also instructed to wear a mask and social distance if he goes out. The opportunity to ask questions was provided.

## 2022-04-21 NOTE — Anesthesia Preprocedure Evaluation (Addendum)
Anesthesia Evaluation  Patient identified by MRN, date of birth, ID band Patient awake    Reviewed: Allergy & Precautions, NPO status , Patient's Chart, lab work & pertinent test results, reviewed documented beta blocker date and time   Airway Mallampati: III  TM Distance: >3 FB Neck ROM: Limited    Dental  (+) Dental Advisory Given, Edentulous Upper, Edentulous Lower   Pulmonary asthma , former smoker,    Pulmonary exam normal breath sounds clear to auscultation       Cardiovascular hypertension, Pt. on home beta blockers and Pt. on medications (-) angina+ CAD, + Past MI and + Cardiac Stents  (-) CABG Normal cardiovascular exam Rhythm:Regular Rate:Normal     Neuro/Psych PSYCHIATRIC DISORDERS Depression  Spinal stenosis, lumbar region with neurogenic claudication  Neuromuscular disease    GI/Hepatic Neg liver ROS, GERD  ,  Endo/Other  diabetes, Well Controlled, Type 2  Renal/GU Renal InsufficiencyRenal disease     Musculoskeletal  (+) Arthritis ,   Abdominal   Peds  Hematology  (+) Blood dyscrasia (Plavix), ,   Anesthesia Other Findings   Reproductive/Obstetrics                          Anesthesia Physical Anesthesia Plan  ASA: 3  Anesthesia Plan: General   Post-op Pain Management: Tylenol PO (pre-op)*   Induction: Intravenous  PONV Risk Score and Plan: 2 and Dexamethasone, Ondansetron and Treatment may vary due to age or medical condition  Airway Management Planned: Oral ETT and Video Laryngoscope Planned  Additional Equipment: Arterial line  Intra-op Plan:   Post-operative Plan: Possible Post-op intubation/ventilation  Informed Consent: I have reviewed the patients History and Physical, chart, labs and discussed the procedure including the risks, benefits and alternatives for the proposed anesthesia with the patient or authorized representative who has indicated his/her  understanding and acceptance.     Dental advisory given  Plan Discussed with: CRNA  Anesthesia Plan Comments: (PAT note written 04/21/2022 by Shonna Chock, PA-C.  2 large bore PIVs )     Anesthesia Quick Evaluation

## 2022-04-21 NOTE — Progress Notes (Signed)
Anesthesia Chart Review:  Case: [id number] Date/Time: 04/24/22 0815   Procedure: L3-4 L4-5 L5-S1 Bilateral PLIF (Bilateral) - RM 19   Anesthesia type: General   Pre-op diagnosis: Spinal stenosis, lumbar region with neurogenic claudication   [facility] / [facility]   Surgeons: Cabbell, Kyle, MD       DISCUSSION: Patient is a 71-year-old male scheduled for the above procedure.  History includes former smoker (quit 04/24/84), HLD, GERD, CAD (NSTEMI 10/3018, s/p DES pCX & DES dCX 11/16/18 with staged DES RCA, DES PDA 12/05/18), dyspnea ("cold" related), BPH, DM2, peripheral neuropathy (felt to be hereditary), spinal surgery (T10-11 laminectomy 12/22/11; right L3-4 microdiscectomy 05/01/14; right L4-S1 laminectomy 11/08/14).  Preoperative cardiology evaluation on 03/26/22 by Monge, Emily, NP. CAD stable, without anginal symptoms. She wrote, "Preoperative cardiac exam: According to the Revised Cardiac Risk Index (RCRI), his Perioperative Risk of Major Cardiac Event is (%): 0.9. His Functional Capacity in METs is: 7.34 according to the Duke Activity Status Index (DASI). Therefore, based on ACC/AHA guidelines, patient would be at acceptable risk for the planned procedure without further cardiovascular testing.  Our office protocol, he may hold his Plavix for 5 days prior to procedure.  Please resume Plavix as soon as possible postprocedure, the discretion of the surgeon." One year follow-up planned.   Last ASA and Plavix scheduled for 04/18/22.  Anesthesia team to evaluate on the day of surgery.   VS: BP 111/80   Pulse (!) 54   Temp 36.7 C (Oral)   Resp 17   Ht 5' 8" (1.727 m)   Wt 86.5 kg   SpO2 100%   BMI 28.98 kg/m    PROVIDERS: Griffin, John, MD is PCP  Croitoru, Mihai, MD is cardiologist Slack, Sarah, NP is neurology provider. Last visit 10/01/21. Continue gabapentin, Lyrica, Tofranil, Zonegran for peripheral neuropathy and chronic low back pain.    LABS: Labs reviewed: Acceptable for  surgery. (all labs ordered are listed, but only abnormal results are displayed)  Labs Reviewed  SURGICAL PCR SCREEN - Abnormal; Notable for the following components:      Result Value   Staphylococcus aureus POSITIVE (*)    All other components within normal limits  BASIC METABOLIC PANEL - Abnormal; Notable for the following components:   Glucose, Bld 109 (*)    BUN 26 (*)    Calcium 8.6 (*)    All other components within normal limits  HEMOGLOBIN A1C - Abnormal; Notable for the following components:   Hgb A1c MFr Bld 5.9 (*)    All other components within normal limits  CBC  TYPE AND SCREEN     IMAGES: MRI C-spine 03/04/22 ([facility]): IMPRESSION: 1. Moderate spinal canal stenosis at C3-4 and C4-5 secondary to combination of disc bulges and uncovertebral hypertrophy. 2. Severe right C3-4 and bilateral C4-5 and C5-6 neural foraminal stenosis.  MRI L-spine 03/04/22 ([facility]): IMPRESSION: 1. Progression of severe bilateral L5-S1 neural foraminal stenosis. 2. Progression of narrowing of both lateral recesses at L4-5 without central spinal canal stenosis. 3. Unchanged mild central spinal canal stenosis and moderate bilateral neural foraminal stenosis at L3-4.    EKG: EKG 03/26/22 ([facility]):  Suspect arm lead reversal, interpretation assumes no reversal Normal sinus rhythm Lateral infarct, age undetermined Inferior infarct, age undetermined  EKG 12/20/20 ([facility]): Sinus bradycardia with marked sinus arrhythmia.  Ventricular rate 54 bpm.  Low voltage QRS.   CV: Cardiac cath/PCI 11/15/18: Prox RCA lesion is 90% stenosed. Mid RCA lesion is 85% stenosed. RPDA   lesion is 90% stenosed. Dist LAD lesion is 40% stenosed. Ost Cx to Prox Cx lesion is 95% stenosed. A drug-eluting stent was successfully placed using a STENT SYNERGY DES 3.5X28. Prox Cx to Mid Cx lesion is 100% stenosed. Post intervention, there is a 0% residual stenosis. A drug-eluting stent was  successfully placed using a STENT SYNERGY DES 2.25X32. Post intervention, there is a 0% residual stenosis. The left ventricular systolic function is normal. LV end diastolic pressure is moderately elevated. The left ventricular ejection fraction is 55-65% by visual estimate.   1. Severe 2 vessel obstructive CAD.    - 95% proximal LCx. 100% distal LCx- this is the culprit lesion    - 90% proximal RCA, 85% segmental mid RCA, 90% PDA 2. Normal LV function 3. Moderately elevated LVEDP 4. Successful PCI of the proximal and distal LCX with DES x 2.    Plan: DAPT for one year. Hydrate and observe renal function. Planned for stage PCI of the RCA and PDA in next 48 hours.   Staged PCI 11/16/18: Successful RCA PCI resulting in proximal 90% stenosis, mid to distal 85% stenosis and PDA 90% stenosis being reduced to 0%, 0%, and 0% using Onyx drug-eluting stents.  TIMI grade III flow was noted.  The RCA proper was treated with 3.5 mm Onyx stents postdilated to 3.75 mm.  The PDA was treated with 2.0 Onyx stent deployed at 14 atm.   Past Medical History:  Diagnosis Date   Arthritis    BPH without obstruction/lower urinary tract symptoms    Bronchitis with asthma, acute 11/2011   CAD S/P percutaneous coronary angioplasty 11/15/2018   Severe 2 vessel obstructive CAD:. Ost-pCx 95% (DES PCI  STENT SYNERGY DES 3.5X28) &p-mCx 100% (DES PCI STENT SYNERGY DES 2.25X32). pRCA 90%, mRCA 85% & rPDA 90%.dLAD ~40%.  EF 55 to 60%.  Moderately elevated LVEDP.  No R WMA.  Plan staged PCI to 3 RCA-PDA lesions 4/1   Depression    denies   Diabetes mellitus without complication (HCC)    Type II- Diet controlled   Frequency of urination    URINATION AT NIGHT, PAINFUL SLOW STREAM    GERD (gastroesophageal reflux disease)    occ   Hyperlipidemia    Neuromuscular disorder (HCC)    DIZZINESS   PERIPHERAL NEUROPATHY    Neuropathy    No pertinent past medical history    RINGING RIGHT EAR   NSTEMI (non-ST elevated  myocardial infarction) (HCC) 11/13/2018   Recurrent upper respiratory infection (URI)    SOB CURRENT COLD   Shortness of breath    d/t "cold" 12/02/11    Past Surgical History:  Procedure Laterality Date   BACK SURGERY     CARDIAC CATHETERIZATION     CORONARY STENT INTERVENTION N/A 11/15/2018   Procedure: CORONARY STENT INTERVENTION;  Surgeon: Jordan, Peter M, MD;  [facility];;  Ost-pCx 95% (DES PCI  STENT SYNERGY DES 3.5X28) &p-mCx 100% (DES PCI STENT SYNERGY DES 2.25X32)   CORONARY STENT INTERVENTION N/A 11/16/2018   Procedure: CORONARY STENT INTERVENTION;  Surgeon: Smith, Henry W, MD;  [facility];  Service: Cardiovascular;  Laterality: N/A;   FINGER SURGERY     RIGHT MIDDLE FINGER   HERNIA REPAIR     UMBILICAL HERNIA 2011   LEFT HEART CATH AND CORONARY ANGIOGRAPHY N/A 11/15/2018   Procedure: LEFT HEART CATH AND CORONARY ANGIOGRAPHY;  Surgeon: Jordan, Peter M, MD;  [facility];; Severe 2   vessel obstructive CAD:. Ost-pCx 95% (DES PCI) &p-mCx 100% (DES PCI). pRCA 90%, mRCA 85% & rPDA 90%.dLAD ~40%.  EF 55 to 60%.  Moderately elevated LVEDP.  No R WMA.  -- Plan staged PCI to 3 RCA-PDA lesions 4/1   LUMBAR LAMINECTOMY/DECOMPRESSION MICRODISCECTOMY  12/22/2011   Procedure: LUMBAR LAMINECTOMY/DECOMPRESSION MICRODISCECTOMY 1 LEVEL;  Surgeon: Erline Levine, MD;  Location: [place] [facility];  Service: Neurosurgery;  Laterality: N/A;  Thoracic ten-eleven laminectomy   LUMBAR LAMINECTOMY/DECOMPRESSION MICRODISCECTOMY Right 05/01/2014   Procedure: Right Lumbar three-four Microdiskectomy;  Surgeon: Erline Levine, MD;  Location: [place] [facility];  Service: Neurosurgery;  Laterality: Right;   LUMBAR LAMINECTOMY/DECOMPRESSION MICRODISCECTOMY Right 11/08/2014   Procedure: Right L4-5 L5-S1 Laminectomy;  Surgeon: Erline Levine, MD;  Location: [place] [facility];  Service: Neurosurgery;  Laterality: Right;  Right L4-5 L5-S1 Laminectomy   MULTIPLE TOOTH EXTRACTIONS       MEDICATIONS:  albuterol (VENTOLIN HFA) 108 (90 Base) MCG/ACT inhaler   Alpha-D-Galactosidase (BEANO) TABS   aspirin EC 81 MG tablet   atorvastatin (LIPITOR) 80 MG tablet   B Complex Vitamins (VITAMIN B COMPLEX) TABS   blood glucose meter kit and supplies   cholecalciferol (VITAMIN D3) 25 MCG (1000 UT) tablet   clopidogrel (PLAVIX) 75 MG tablet   Cyanocobalamin 2500 MCG TABS   ferrous sulfate 325 (65 FE) MG EC tablet   finasteride (PROSCAR) 5 MG tablet   fluticasone (FLONASE) 50 MCG/ACT nasal spray   gabapentin (NEURONTIN) 676 MG tablet   GARLIC PO   imipramine (TOFRANIL-PM) 150 MG capsule   metoprolol succinate (TOPROL-XL) 25 MG 24 hr tablet   nitroGLYCERIN (NITROSTAT) 0.4 MG SL tablet   Omega-3 Fatty Acids (FISH OIL) 1000 MG CAPS   pantoprazole (PROTONIX) 40 MG tablet   senna-docusate (SENOKOT-S) 8.6-50 MG tablet   tamsulosin (FLOMAX) 0.4 MG CAPS capsule   zonisamide (ZONEGRAN) 100 MG capsule   No current facility-administered medications for this encounter.    Myra Gianotti, PA-C Surgical Short Stay/Anesthesiology [facility] Phone [phone] [facility] Phone [phone] 04/21/2022 4:29 PM

## 2022-04-22 ENCOUNTER — Other Ambulatory Visit: Payer: Self-pay | Admitting: Neurology

## 2022-04-24 ENCOUNTER — Other Ambulatory Visit: Payer: Self-pay

## 2022-04-24 ENCOUNTER — Inpatient Hospital Stay (HOSPITAL_COMMUNITY): Payer: Medicare Other

## 2022-04-24 ENCOUNTER — Encounter (HOSPITAL_COMMUNITY): Payer: Self-pay | Admitting: Neurosurgery

## 2022-04-24 ENCOUNTER — Inpatient Hospital Stay (HOSPITAL_COMMUNITY): Payer: Medicare Other | Admitting: Vascular Surgery

## 2022-04-24 ENCOUNTER — Inpatient Hospital Stay (HOSPITAL_COMMUNITY)
Admission: RE | Disposition: A | Discharge status: Rehabilitation Facility | Payer: Self-pay | Source: Ambulatory Visit | Attending: Neurosurgery

## 2022-04-24 ENCOUNTER — Inpatient Hospital Stay (HOSPITAL_COMMUNITY): Payer: Medicare Other | Admitting: Anesthesiology

## 2022-04-24 ENCOUNTER — Inpatient Hospital Stay (HOSPITAL_COMMUNITY)
Admission: RE | Admit: 2022-04-24 | Discharge: 2022-04-29 | DRG: 459 | Disposition: A | Discharge status: Rehabilitation Facility | Payer: Medicare Other | Source: Ambulatory Visit | Attending: Neurosurgery | Admitting: Neurosurgery

## 2022-04-24 DIAGNOSIS — Z87891 Personal history of nicotine dependence: Secondary | ICD-10-CM

## 2022-04-24 DIAGNOSIS — Z981 Arthrodesis status: Secondary | ICD-10-CM | POA: Diagnosis not present

## 2022-04-24 DIAGNOSIS — Y838 Other surgical procedures as the cause of abnormal reaction of the patient, or of later complication, without mention of misadventure at the time of the procedure: Secondary | ICD-10-CM | POA: Diagnosis present

## 2022-04-24 DIAGNOSIS — L8990 Pressure ulcer of unspecified site, unspecified stage: Secondary | ICD-10-CM | POA: Insufficient documentation

## 2022-04-24 DIAGNOSIS — T8119XA Other postprocedural shock, initial encounter: Secondary | ICD-10-CM | POA: Diagnosis not present

## 2022-04-24 DIAGNOSIS — N170 Acute kidney failure with tubular necrosis: Secondary | ICD-10-CM | POA: Diagnosis not present

## 2022-04-24 DIAGNOSIS — Z7902 Long term (current) use of antithrombotics/antiplatelets: Secondary | ICD-10-CM | POA: Diagnosis not present

## 2022-04-24 DIAGNOSIS — M545 Low back pain, unspecified: Secondary | ICD-10-CM | POA: Diagnosis not present

## 2022-04-24 DIAGNOSIS — I10 Essential (primary) hypertension: Secondary | ICD-10-CM | POA: Diagnosis not present

## 2022-04-24 DIAGNOSIS — Z823 Family history of stroke: Secondary | ICD-10-CM | POA: Diagnosis not present

## 2022-04-24 DIAGNOSIS — I959 Hypotension, unspecified: Secondary | ICD-10-CM

## 2022-04-24 DIAGNOSIS — M48062 Spinal stenosis, lumbar region with neurogenic claudication: Secondary | ICD-10-CM | POA: Diagnosis present

## 2022-04-24 DIAGNOSIS — D62 Acute posthemorrhagic anemia: Secondary | ICD-10-CM

## 2022-04-24 DIAGNOSIS — Z5329 Procedure and treatment not carried out because of patient's decision for other reasons: Secondary | ICD-10-CM | POA: Diagnosis not present

## 2022-04-24 DIAGNOSIS — E785 Hyperlipidemia, unspecified: Secondary | ICD-10-CM | POA: Diagnosis present

## 2022-04-24 DIAGNOSIS — R338 Other retention of urine: Secondary | ICD-10-CM | POA: Diagnosis present

## 2022-04-24 DIAGNOSIS — Z91012 Allergy to eggs: Secondary | ICD-10-CM

## 2022-04-24 DIAGNOSIS — K219 Gastro-esophageal reflux disease without esophagitis: Secondary | ICD-10-CM | POA: Diagnosis present

## 2022-04-24 DIAGNOSIS — G9340 Encephalopathy, unspecified: Secondary | ICD-10-CM | POA: Diagnosis not present

## 2022-04-24 DIAGNOSIS — M4316 Spondylolisthesis, lumbar region: Secondary | ICD-10-CM | POA: Diagnosis present

## 2022-04-24 DIAGNOSIS — I252 Old myocardial infarction: Secondary | ICD-10-CM

## 2022-04-24 DIAGNOSIS — E875 Hyperkalemia: Secondary | ICD-10-CM | POA: Diagnosis not present

## 2022-04-24 DIAGNOSIS — I9581 Postprocedural hypotension: Secondary | ICD-10-CM | POA: Diagnosis not present

## 2022-04-24 DIAGNOSIS — M48061 Spinal stenosis, lumbar region without neurogenic claudication: Secondary | ICD-10-CM

## 2022-04-24 DIAGNOSIS — M47816 Spondylosis without myelopathy or radiculopathy, lumbar region: Secondary | ICD-10-CM | POA: Diagnosis present

## 2022-04-24 DIAGNOSIS — N179 Acute kidney failure, unspecified: Secondary | ICD-10-CM | POA: Diagnosis not present

## 2022-04-24 DIAGNOSIS — G928 Other toxic encephalopathy: Secondary | ICD-10-CM | POA: Diagnosis not present

## 2022-04-24 DIAGNOSIS — E1151 Type 2 diabetes mellitus with diabetic peripheral angiopathy without gangrene: Secondary | ICD-10-CM | POA: Diagnosis present

## 2022-04-24 DIAGNOSIS — N400 Enlarged prostate without lower urinary tract symptoms: Secondary | ICD-10-CM | POA: Diagnosis present

## 2022-04-24 DIAGNOSIS — F32A Depression, unspecified: Secondary | ICD-10-CM | POA: Diagnosis present

## 2022-04-24 DIAGNOSIS — Z79899 Other long term (current) drug therapy: Secondary | ICD-10-CM | POA: Diagnosis not present

## 2022-04-24 DIAGNOSIS — I2510 Atherosclerotic heart disease of native coronary artery without angina pectoris: Secondary | ICD-10-CM

## 2022-04-24 DIAGNOSIS — M792 Neuralgia and neuritis, unspecified: Secondary | ICD-10-CM | POA: Diagnosis not present

## 2022-04-24 DIAGNOSIS — Z48811 Encounter for surgical aftercare following surgery on the nervous system: Secondary | ICD-10-CM | POA: Diagnosis not present

## 2022-04-24 DIAGNOSIS — T4275XA Adverse effect of unspecified antiepileptic and sedative-hypnotic drugs, initial encounter: Secondary | ICD-10-CM | POA: Diagnosis not present

## 2022-04-24 DIAGNOSIS — R112 Nausea with vomiting, unspecified: Secondary | ICD-10-CM | POA: Diagnosis not present

## 2022-04-24 DIAGNOSIS — G959 Disease of spinal cord, unspecified: Secondary | ICD-10-CM | POA: Diagnosis not present

## 2022-04-24 DIAGNOSIS — E739 Lactose intolerance, unspecified: Secondary | ICD-10-CM | POA: Diagnosis present

## 2022-04-24 DIAGNOSIS — M4326 Fusion of spine, lumbar region: Secondary | ICD-10-CM | POA: Diagnosis not present

## 2022-04-24 DIAGNOSIS — Z955 Presence of coronary angioplasty implant and graft: Secondary | ICD-10-CM | POA: Diagnosis not present

## 2022-04-24 DIAGNOSIS — Z7982 Long term (current) use of aspirin: Secondary | ICD-10-CM | POA: Diagnosis not present

## 2022-04-24 DIAGNOSIS — K5900 Constipation, unspecified: Secondary | ICD-10-CM | POA: Diagnosis not present

## 2022-04-24 DIAGNOSIS — E1142 Type 2 diabetes mellitus with diabetic polyneuropathy: Secondary | ICD-10-CM | POA: Diagnosis not present

## 2022-04-24 DIAGNOSIS — I25810 Atherosclerosis of coronary artery bypass graft(s) without angina pectoris: Secondary | ICD-10-CM | POA: Diagnosis not present

## 2022-04-24 DIAGNOSIS — H9190 Unspecified hearing loss, unspecified ear: Secondary | ICD-10-CM | POA: Diagnosis not present

## 2022-04-24 DIAGNOSIS — Z01818 Encounter for other preprocedural examination: Secondary | ICD-10-CM

## 2022-04-24 HISTORY — PX: BACK SURGERY: SHX140

## 2022-04-24 LAB — CBC
HCT: 29.7 % — ABNORMAL LOW (ref 39.0–52.0)
Hemoglobin: 10.1 g/dL — ABNORMAL LOW (ref 13.0–17.0)
MCH: 31.8 pg (ref 26.0–34.0)
MCHC: 34 g/dL (ref 30.0–36.0)
MCV: 93.4 fL (ref 80.0–100.0)
Platelets: 126 10*3/uL — ABNORMAL LOW (ref 150–400)
RBC: 3.18 MIL/uL — ABNORMAL LOW (ref 4.22–5.81)
RDW: 13.3 % (ref 11.5–15.5)
WBC: 13.3 10*3/uL — ABNORMAL HIGH (ref 4.0–10.5)
nRBC: 0 % (ref 0.0–0.2)

## 2022-04-24 LAB — POCT I-STAT 7, (LYTES, BLD GAS, ICA,H+H)
Acid-base deficit: 5 mmol/L — ABNORMAL HIGH (ref 0.0–2.0)
Acid-base deficit: 5 mmol/L — ABNORMAL HIGH (ref 0.0–2.0)
Acid-base deficit: 5 mmol/L — ABNORMAL HIGH (ref 0.0–2.0)
Acid-base deficit: 4 mmol/L — ABNORMAL HIGH (ref 0.0–2.0)
Acid-base deficit: 5 mmol/L — ABNORMAL HIGH (ref 0.0–2.0)
Acid-base deficit: 5 mmol/L — ABNORMAL HIGH (ref 0.0–2.0)
Bicarbonate: 22.1 mmol/L (ref 20.0–28.0)
Bicarbonate: 21.4 mmol/L (ref 20.0–28.0)
Bicarbonate: 21.1 mmol/L (ref 20.0–28.0)
Bicarbonate: 21.1 mmol/L (ref 20.0–28.0)
Bicarbonate: 20.3 mmol/L (ref 20.0–28.0)
Bicarbonate: 21.7 mmol/L (ref 20.0–28.0)
Calcium, Ion: 1.22 mmol/L (ref 1.15–1.40)
Calcium, Ion: 1.19 mmol/L (ref 1.15–1.40)
Calcium, Ion: 1.19 mmol/L (ref 1.15–1.40)
Calcium, Ion: 1.17 mmol/L (ref 1.15–1.40)
Calcium, Ion: 1.16 mmol/L (ref 1.15–1.40)
Calcium, Ion: 1.13 mmol/L — ABNORMAL LOW (ref 1.15–1.40)
HCT: 37 % — ABNORMAL LOW (ref 39.0–52.0)
HCT: 37 % — ABNORMAL LOW (ref 39.0–52.0)
HCT: 36 % — ABNORMAL LOW (ref 39.0–52.0)
HCT: 34 % — ABNORMAL LOW (ref 39.0–52.0)
HCT: 31 % — ABNORMAL LOW (ref 39.0–52.0)
HCT: 30 % — ABNORMAL LOW (ref 39.0–52.0)
Hemoglobin: 12.6 g/dL — ABNORMAL LOW (ref 13.0–17.0)
Hemoglobin: 12.6 g/dL — ABNORMAL LOW (ref 13.0–17.0)
Hemoglobin: 12.2 g/dL — ABNORMAL LOW (ref 13.0–17.0)
Hemoglobin: 11.6 g/dL — ABNORMAL LOW (ref 13.0–17.0)
Hemoglobin: 10.5 g/dL — ABNORMAL LOW (ref 13.0–17.0)
Hemoglobin: 10.2 g/dL — ABNORMAL LOW (ref 13.0–17.0)
O2 Saturation: 99 %
O2 Saturation: 99 %
O2 Saturation: 99 %
O2 Saturation: 99 %
O2 Saturation: 99 %
O2 Saturation: 95 %
Patient temperature: 35.5
Patient temperature: 35.4
Patient temperature: 35.8
Patient temperature: 36
Patient temperature: 97.8
Potassium: 3.8 mmol/L (ref 3.5–5.1)
Potassium: 3.8 mmol/L (ref 3.5–5.1)
Potassium: 3.9 mmol/L (ref 3.5–5.1)
Potassium: 3.9 mmol/L (ref 3.5–5.1)
Potassium: 3.8 mmol/L (ref 3.5–5.1)
Potassium: 4.1 mmol/L (ref 3.5–5.1)
Sodium: 139 mmol/L (ref 135–145)
Sodium: 138 mmol/L (ref 135–145)
Sodium: 139 mmol/L (ref 135–145)
Sodium: 139 mmol/L (ref 135–145)
Sodium: 139 mmol/L (ref 135–145)
Sodium: 139 mmol/L (ref 135–145)
TCO2: 24 mmol/L (ref 22–32)
TCO2: 23 mmol/L (ref 22–32)
TCO2: 22 mmol/L (ref 22–32)
TCO2: 22 mmol/L (ref 22–32)
TCO2: 21 mmol/L — ABNORMAL LOW (ref 22–32)
TCO2: 23 mmol/L (ref 22–32)
pCO2 arterial: 44.8 mmHg (ref 32–48)
pCO2 arterial: 40.5 mmHg (ref 32–48)
pCO2 arterial: 41.6 mmHg (ref 32–48)
pCO2 arterial: 37.9 mmHg (ref 32–48)
pCO2 arterial: 37.4 mmHg (ref 32–48)
pCO2 arterial: 43.1 mmHg (ref 32–48)
pH, Arterial: 7.294 — ABNORMAL LOW (ref 7.35–7.45)
pH, Arterial: 7.323 — ABNORMAL LOW (ref 7.35–7.45)
pH, Arterial: 7.313 — ABNORMAL LOW (ref 7.35–7.45)
pH, Arterial: 7.348 — ABNORMAL LOW (ref 7.35–7.45)
pH, Arterial: 7.337 — ABNORMAL LOW (ref 7.35–7.45)
pH, Arterial: 7.308 — ABNORMAL LOW (ref 7.35–7.45)
pO2, Arterial: 139 mmHg — ABNORMAL HIGH (ref 83–108)
pO2, Arterial: 139 mmHg — ABNORMAL HIGH (ref 83–108)
pO2, Arterial: 142 mmHg — ABNORMAL HIGH (ref 83–108)
pO2, Arterial: 142 mmHg — ABNORMAL HIGH (ref 83–108)
pO2, Arterial: 145 mmHg — ABNORMAL HIGH (ref 83–108)
pO2, Arterial: 82 mmHg — ABNORMAL LOW (ref 83–108)

## 2022-04-24 LAB — GLUCOSE, CAPILLARY
Glucose-Capillary: 111 mg/dL — ABNORMAL HIGH (ref 70–99)
Glucose-Capillary: 149 mg/dL — ABNORMAL HIGH (ref 70–99)
Glucose-Capillary: 165 mg/dL — ABNORMAL HIGH (ref 70–99)
Glucose-Capillary: 236 mg/dL — ABNORMAL HIGH (ref 70–99)

## 2022-04-24 LAB — BASIC METABOLIC PANEL
Anion gap: 12 (ref 5–15)
BUN: 26 mg/dL — ABNORMAL HIGH (ref 8–23)
CO2: 19 mmol/L — ABNORMAL LOW (ref 22–32)
Calcium: 8 mg/dL — ABNORMAL LOW (ref 8.9–10.3)
Chloride: 108 mmol/L (ref 98–111)
Creatinine, Ser: 1.3 mg/dL — ABNORMAL HIGH (ref 0.61–1.24)
GFR, Estimated: 59 mL/min — ABNORMAL LOW (ref >60)
Glucose, Bld: 187 mg/dL — ABNORMAL HIGH (ref 70–99)
Potassium: 4.1 mmol/L (ref 3.5–5.1)
Sodium: 139 mmol/L (ref 135–145)

## 2022-04-24 LAB — MRSA NEXT GEN BY PCR, NASAL: MRSA by PCR Next Gen: NOT DETECTED

## 2022-04-24 LAB — PHOSPHORUS: Phosphorus: 4.9 mg/dL — ABNORMAL HIGH (ref 2.5–4.6)

## 2022-04-24 LAB — MAGNESIUM: Magnesium: 1.6 mg/dL — ABNORMAL LOW (ref 1.7–2.4)

## 2022-04-24 SURGERY — POSTERIOR LUMBAR FUSION 3 LEVEL
Anesthesia: General | Laterality: Bilateral

## 2022-04-24 MED ORDER — ALBUTEROL SULFATE (2.5 MG/3ML) 0.083% IN NEBU: 2.5000 mg | INHALATION_SOLUTION | RESPIRATORY_TRACT | Status: DC | PRN

## 2022-04-24 MED ORDER — ONDANSETRON HCL 4 MG/2ML IJ SOLN
INTRAMUSCULAR | Status: AC
  Filled 2022-04-24: qty 2

## 2022-04-24 MED ORDER — LIDOCAINE 2% (20 MG/ML) 5 ML SYRINGE
INTRAMUSCULAR | Status: DC | PRN
  Administered 2022-04-24: 100 mg via INTRAVENOUS

## 2022-04-24 MED ORDER — FERROUS SULFATE 325 (65 FE) MG PO TABS
325.0000 mg | ORAL_TABLET | Freq: Every day | ORAL | Status: DC
  Administered 2022-04-24 – 2022-04-29 (×5): 325 mg via ORAL
  Filled 2022-04-24 (×7): qty 1

## 2022-04-24 MED ORDER — FINASTERIDE 5 MG PO TABS
5.0000 mg | ORAL_TABLET | Freq: Every day | ORAL | Status: DC
  Administered 2022-04-24 – 2022-04-29 (×6): 5 mg via ORAL
  Filled 2022-04-24 (×7): qty 1

## 2022-04-24 MED ORDER — FLUTICASONE PROPIONATE 50 MCG/ACT NA SUSP: 2.0000 | Freq: Every day | NASAL | Status: DC | PRN

## 2022-04-24 MED ORDER — GABAPENTIN 300 MG PO CAPS
800.0000 mg | ORAL_CAPSULE | Freq: Four times a day (QID) | ORAL | Status: DC
  Administered 2022-04-25 – 2022-04-26 (×5): 800 mg via ORAL
  Filled 2022-04-24 (×5): qty 2

## 2022-04-24 MED ORDER — ASPIRIN 81 MG PO TBEC: 81.0000 mg | DELAYED_RELEASE_TABLET | ORAL | Status: DC

## 2022-04-24 MED ORDER — PHENYLEPHRINE HCL-NACL 20-0.9 MG/250ML-% IV SOLN
INTRAVENOUS | Status: DC | PRN
  Administered 2022-04-24: 50 ug/min via INTRAVENOUS

## 2022-04-24 MED ORDER — MENTHOL 3 MG MT LOZG: 1.0000 | LOZENGE | OROMUCOSAL | Status: DC | PRN

## 2022-04-24 MED ORDER — PANTOPRAZOLE SODIUM 40 MG PO TBEC
40.0000 mg | DELAYED_RELEASE_TABLET | Freq: Every day | ORAL | Status: DC
  Administered 2022-04-24 – 2022-04-29 (×6): 40 mg via ORAL
  Filled 2022-04-24 (×6): qty 1

## 2022-04-24 MED ORDER — PROPOFOL 1000 MG/100ML IV EMUL
INTRAVENOUS | Status: AC
  Filled 2022-04-24: qty 100

## 2022-04-24 MED ORDER — SENNOSIDES-DOCUSATE SODIUM 8.6-50 MG PO TABS: 2.0000 | ORAL_TABLET | Freq: Every evening | ORAL | Status: DC | PRN

## 2022-04-24 MED ORDER — PROPOFOL 10 MG/ML IV BOLUS
INTRAVENOUS | Status: AC
  Filled 2022-04-24: qty 20

## 2022-04-24 MED ORDER — PHENYLEPHRINE 80 MCG/ML (10ML) SYRINGE FOR IV PUSH (FOR BLOOD PRESSURE SUPPORT)
PREFILLED_SYRINGE | INTRAVENOUS | Status: AC
  Filled 2022-04-24: qty 10

## 2022-04-24 MED ORDER — THROMBIN 20000 UNITS EX SOLR
CUTANEOUS | Status: AC
  Filled 2022-04-24: qty 20000

## 2022-04-24 MED ORDER — HYDROMORPHONE HCL 1 MG/ML IJ SOLN
INTRAMUSCULAR | Status: DC | PRN
  Administered 2022-04-24: .5 mg via INTRAVENOUS

## 2022-04-24 MED ORDER — METOPROLOL SUCCINATE ER 25 MG PO TB24
25.0000 mg | ORAL_TABLET | Freq: Every day | ORAL | Status: DC
Start: 2022-04-24 — End: 2022-04-26
  Administered 2022-04-25: 25 mg via ORAL
  Filled 2022-04-24 (×2): qty 1

## 2022-04-24 MED ORDER — SODIUM BICARBONATE 8.4 % IV SOLN
INTRAVENOUS | Status: DC | PRN
  Administered 2022-04-24 (×2): 25 mL via INTRAVENOUS

## 2022-04-24 MED ORDER — ONDANSETRON HCL 4 MG/2ML IJ SOLN
4.0000 mg | Freq: Once | INTRAMUSCULAR | Status: DC | PRN
Start: 2022-04-24 — End: 2022-04-24

## 2022-04-24 MED ORDER — FLEET ENEMA 7-19 GM/118ML RE ENEM: 1.0000 | ENEMA | Freq: Once | RECTAL | Status: DC | PRN

## 2022-04-24 MED ORDER — FENTANYL CITRATE (PF) 100 MCG/2ML IJ SOLN
25.0000 ug | INTRAMUSCULAR | Status: DC | PRN
  Administered 2022-04-24: 25 ug via INTRAVENOUS

## 2022-04-24 MED ORDER — FENTANYL CITRATE (PF) 100 MCG/2ML IJ SOLN
INTRAMUSCULAR | Status: AC
  Filled 2022-04-24: qty 2

## 2022-04-24 MED ORDER — ACETAMINOPHEN 500 MG PO TABS
1000.0000 mg | ORAL_TABLET | Freq: Once | ORAL | Status: AC
  Administered 2022-04-24: 1000 mg via ORAL
  Filled 2022-04-24: qty 2

## 2022-04-24 MED ORDER — LIDOCAINE-EPINEPHRINE 0.5 %-1:200000 IJ SOLN
INTRAMUSCULAR | Status: DC | PRN
  Administered 2022-04-24: 10 mL

## 2022-04-24 MED ORDER — SUGAMMADEX SODIUM 200 MG/2ML IV SOLN: INTRAVENOUS | Status: DC | PRN

## 2022-04-24 MED ORDER — SODIUM CHLORIDE 0.9% FLUSH
3.0000 mL | Freq: Two times a day (BID) | INTRAVENOUS | Status: DC
  Administered 2022-04-24 – 2022-04-28 (×8): 3 mL via INTRAVENOUS

## 2022-04-24 MED ORDER — PROPOFOL 10 MG/ML IV BOLUS
INTRAVENOUS | Status: DC | PRN
  Administered 2022-04-24: 50 mg via INTRAVENOUS
  Administered 2022-04-24: 30 mg via INTRAVENOUS
  Administered 2022-04-24: 150 mg via INTRAVENOUS

## 2022-04-24 MED ORDER — ZOLPIDEM TARTRATE 5 MG PO TABS
5.0000 mg | ORAL_TABLET | Freq: Every evening | ORAL | Status: DC | PRN
  Administered 2022-04-28: 5 mg via ORAL
  Filled 2022-04-24: qty 1

## 2022-04-24 MED ORDER — CHLORHEXIDINE GLUCONATE 0.12 % MT SOLN
15.0000 mL | Freq: Once | OROMUCOSAL | Status: AC
  Administered 2022-04-24: 15 mL via OROMUCOSAL
  Filled 2022-04-24: qty 15

## 2022-04-24 MED ORDER — ALBUMIN HUMAN 5 % IV SOLN
12.5000 g | Freq: Once | INTRAVENOUS | Status: AC
  Administered 2022-04-24: 12.5 g via INTRAVENOUS

## 2022-04-24 MED ORDER — ORAL CARE MOUTH RINSE: 15.0000 mL | Freq: Once | OROMUCOSAL | Status: AC

## 2022-04-24 MED ORDER — KETAMINE HCL 50 MG/5ML IJ SOSY
PREFILLED_SYRINGE | INTRAMUSCULAR | Status: AC
  Filled 2022-04-24: qty 5

## 2022-04-24 MED ORDER — HEPARIN SODIUM (PORCINE) 5000 UNIT/ML IJ SOLN
5000.0000 [IU] | Freq: Three times a day (TID) | INTRAMUSCULAR | Status: DC
  Administered 2022-04-24 – 2022-04-29 (×14): 5000 [IU] via SUBCUTANEOUS
  Filled 2022-04-24 (×14): qty 1

## 2022-04-24 MED ORDER — ALBUTEROL SULFATE HFA 108 (90 BASE) MCG/ACT IN AERS: 2.0000 | INHALATION_SPRAY | Freq: Four times a day (QID) | RESPIRATORY_TRACT | Status: DC | PRN

## 2022-04-24 MED ORDER — LACTATED RINGERS IV SOLN: INTRAVENOUS | Status: DC

## 2022-04-24 MED ORDER — SODIUM CHLORIDE 0.9 % IV SOLN: INTRAVENOUS | Status: DC | PRN

## 2022-04-24 MED ORDER — ACETAMINOPHEN 10 MG/ML IV SOLN
INTRAVENOUS | Status: AC
  Filled 2022-04-24: qty 100

## 2022-04-24 MED ORDER — SODIUM CHLORIDE 0.9 % IV SOLN
250.0000 mL | INTRAVENOUS | Status: DC
  Administered 2022-04-24: 250 mL via INTRAVENOUS

## 2022-04-24 MED ORDER — BISACODYL 5 MG PO TBEC
5.0000 mg | DELAYED_RELEASE_TABLET | Freq: Every day | ORAL | Status: DC | PRN
  Administered 2022-04-27: 5 mg via ORAL
  Filled 2022-04-24: qty 1

## 2022-04-24 MED ORDER — GABAPENTIN 800 MG PO TABS
800.0000 mg | ORAL_TABLET | Freq: Four times a day (QID) | ORAL | Status: DC
  Filled 2022-04-24: qty 1

## 2022-04-24 MED ORDER — VITAMIN B-12 100 MCG PO TABS
100.0000 ug | ORAL_TABLET | Freq: Every day | ORAL | Status: DC
  Administered 2022-04-24 – 2022-04-29 (×6): 100 ug via ORAL
  Filled 2022-04-24 (×6): qty 1

## 2022-04-24 MED ORDER — PROPOFOL 500 MG/50ML IV EMUL
INTRAVENOUS | Status: DC | PRN
  Administered 2022-04-24: 100 ug/kg/min via INTRAVENOUS

## 2022-04-24 MED ORDER — SODIUM CHLORIDE 0.9% FLUSH: 3.0000 mL | INTRAVENOUS | Status: DC | PRN

## 2022-04-24 MED ORDER — EPHEDRINE SULFATE-NACL 50-0.9 MG/10ML-% IV SOSY
PREFILLED_SYRINGE | INTRAVENOUS | Status: DC | PRN
  Administered 2022-04-24 (×3): 5 mg via INTRAVENOUS

## 2022-04-24 MED ORDER — DEXAMETHASONE SODIUM PHOSPHATE 10 MG/ML IJ SOLN
INTRAMUSCULAR | Status: DC | PRN
  Administered 2022-04-24: 10 mg via INTRAVENOUS

## 2022-04-24 MED ORDER — ROCURONIUM BROMIDE 10 MG/ML (PF) SYRINGE
PREFILLED_SYRINGE | INTRAVENOUS | Status: AC
  Filled 2022-04-24: qty 20

## 2022-04-24 MED ORDER — EPHEDRINE 5 MG/ML INJ
INTRAVENOUS | Status: AC
  Filled 2022-04-24: qty 5

## 2022-04-24 MED ORDER — LIDOCAINE-EPINEPHRINE 0.5 %-1:200000 IJ SOLN
INTRAMUSCULAR | Status: AC
Start: 2022-04-24 — End: ?
  Filled 2022-04-24: qty 1

## 2022-04-24 MED ORDER — B COMPLEX-C PO TABS
1.0000 | ORAL_TABLET | Freq: Every day | ORAL | Status: DC
  Administered 2022-04-24 – 2022-04-29 (×6): 1 via ORAL
  Filled 2022-04-24 (×7): qty 1

## 2022-04-24 MED ORDER — OXYCODONE HCL 5 MG PO TABS
5.0000 mg | ORAL_TABLET | ORAL | Status: DC | PRN
  Administered 2022-04-24 – 2022-04-28 (×4): 5 mg via ORAL
  Filled 2022-04-24 (×4): qty 1

## 2022-04-24 MED ORDER — PANTOPRAZOLE SODIUM 40 MG PO TBEC
40.0000 mg | DELAYED_RELEASE_TABLET | Freq: Every day | ORAL | Status: DC
  Filled 2022-04-24: qty 1

## 2022-04-24 MED ORDER — ACETAMINOPHEN 650 MG RE SUPP: 650.0000 mg | RECTAL | Status: DC | PRN

## 2022-04-24 MED ORDER — ALBUMIN HUMAN 5 % IV SOLN
INTRAVENOUS | Status: AC
  Filled 2022-04-24: qty 250

## 2022-04-24 MED ORDER — BUPIVACAINE HCL (PF) 0.25 % IJ SOLN
INTRAMUSCULAR | Status: AC
  Filled 2022-04-24: qty 30

## 2022-04-24 MED ORDER — BUPIVACAINE HCL (PF) 0.5 % IJ SOLN
INTRAMUSCULAR | Status: DC | PRN
  Administered 2022-04-24: 30 mL

## 2022-04-24 MED ORDER — ALBUMIN HUMAN 5 % IV SOLN: INTRAVENOUS | Status: DC | PRN

## 2022-04-24 MED ORDER — HYDROMORPHONE HCL 1 MG/ML IJ SOLN
1.0000 mg | INTRAMUSCULAR | Status: DC | PRN
  Administered 2022-04-24: 1 mg via INTRAVENOUS
  Filled 2022-04-24: qty 1

## 2022-04-24 MED ORDER — CHLORHEXIDINE GLUCONATE CLOTH 2 % EX PADS
6.0000 | MEDICATED_PAD | Freq: Every day | CUTANEOUS | Status: DC
  Administered 2022-04-24 – 2022-04-29 (×5): 6 via TOPICAL

## 2022-04-24 MED ORDER — OXYCODONE HCL ER 20 MG PO T12A
20.0000 mg | EXTENDED_RELEASE_TABLET | Freq: Two times a day (BID) | ORAL | Status: DC
  Administered 2022-04-24 – 2022-04-25 (×3): 20 mg via ORAL
  Filled 2022-04-24 (×4): qty 1

## 2022-04-24 MED ORDER — NITROGLYCERIN 0.4 MG SL SUBL
0.4000 mg | SUBLINGUAL_TABLET | SUBLINGUAL | Status: DC | PRN
Start: 2022-04-24 — End: 2022-04-29

## 2022-04-24 MED ORDER — SUGAMMADEX SODIUM 200 MG/2ML IV SOLN
INTRAVENOUS | Status: DC | PRN
  Administered 2022-04-24: 400 mg via INTRAVENOUS

## 2022-04-24 MED ORDER — PHENOL 1.4 % MT LIQD
1.0000 | OROMUCOSAL | Status: DC | PRN
Start: 2022-04-24 — End: 2022-04-29

## 2022-04-24 MED ORDER — OXYCODONE HCL 5 MG PO TABS
10.0000 mg | ORAL_TABLET | ORAL | Status: DC | PRN
  Administered 2022-04-29: 10 mg via ORAL
  Filled 2022-04-24: qty 2

## 2022-04-24 MED ORDER — ROCURONIUM BROMIDE 10 MG/ML (PF) SYRINGE
PREFILLED_SYRINGE | INTRAVENOUS | Status: DC | PRN
  Administered 2022-04-24: 20 mg via INTRAVENOUS
  Administered 2022-04-24: 40 mg via INTRAVENOUS
  Administered 2022-04-24 (×3): 20 mg via INTRAVENOUS
  Administered 2022-04-24: 60 mg via INTRAVENOUS
  Administered 2022-04-24: 20 mg via INTRAVENOUS

## 2022-04-24 MED ORDER — SUGAMMADEX SODIUM 500 MG/5ML IV SOLN
INTRAVENOUS | Status: AC
  Filled 2022-04-24: qty 5

## 2022-04-24 MED ORDER — VITAMIN D 25 MCG (1000 UNIT) PO TABS
1000.0000 [IU] | ORAL_TABLET | Freq: Every day | ORAL | Status: DC
  Administered 2022-04-24 – 2022-04-29 (×6): 1000 [IU] via ORAL
  Filled 2022-04-24 (×6): qty 1

## 2022-04-24 MED ORDER — DEXAMETHASONE SODIUM PHOSPHATE 10 MG/ML IJ SOLN
INTRAMUSCULAR | Status: AC
  Filled 2022-04-24: qty 1

## 2022-04-24 MED ORDER — CELECOXIB 200 MG PO CAPS
200.0000 mg | ORAL_CAPSULE | Freq: Two times a day (BID) | ORAL | Status: DC
  Administered 2022-04-24 – 2022-04-26 (×4): 200 mg via ORAL
  Filled 2022-04-24 (×5): qty 1

## 2022-04-24 MED ORDER — CHLORHEXIDINE GLUCONATE CLOTH 2 % EX PADS: 6.0000 | MEDICATED_PAD | Freq: Once | CUTANEOUS | Status: DC

## 2022-04-24 MED ORDER — THROMBIN 20000 UNITS EX SOLR
CUTANEOUS | Status: AC
Start: 2022-04-24 — End: ?
  Filled 2022-04-24: qty 20000

## 2022-04-24 MED ORDER — HYDROMORPHONE HCL 1 MG/ML IJ SOLN
INTRAMUSCULAR | Status: AC
  Filled 2022-04-24: qty 0.5

## 2022-04-24 MED ORDER — ACETAMINOPHEN 10 MG/ML IV SOLN
INTRAVENOUS | Status: DC | PRN
  Administered 2022-04-24: 1000 mg via INTRAVENOUS

## 2022-04-24 MED ORDER — FENTANYL CITRATE (PF) 250 MCG/5ML IJ SOLN
INTRAMUSCULAR | Status: DC | PRN
  Administered 2022-04-24: 100 ug via INTRAVENOUS
  Administered 2022-04-24: 50 ug via INTRAVENOUS

## 2022-04-24 MED ORDER — SODIUM CHLORIDE 0.9 % IV SOLN: 250.0000 mL | INTRAVENOUS | Status: DC

## 2022-04-24 MED ORDER — TAMSULOSIN HCL 0.4 MG PO CAPS
0.4000 mg | ORAL_CAPSULE | Freq: Two times a day (BID) | ORAL | Status: DC
  Administered 2022-04-24 – 2022-04-29 (×10): 0.4 mg via ORAL
  Filled 2022-04-24 (×10): qty 1

## 2022-04-24 MED ORDER — 0.9 % SODIUM CHLORIDE (POUR BTL) OPTIME
TOPICAL | Status: DC | PRN
  Administered 2022-04-24 (×2): 1000 mL

## 2022-04-24 MED ORDER — ADHERUS DURAL SEALANT
PACK | TOPICAL | Status: DC | PRN
  Administered 2022-04-24: 1 via TOPICAL

## 2022-04-24 MED ORDER — KETAMINE HCL 10 MG/ML IJ SOLN
INTRAMUSCULAR | Status: DC | PRN
  Administered 2022-04-24: 20 mg via INTRAVENOUS
  Administered 2022-04-24 (×3): 10 mg via INTRAVENOUS

## 2022-04-24 MED ORDER — CEFAZOLIN SODIUM-DEXTROSE 2-4 GM/100ML-% IV SOLN
2.0000 g | Freq: Three times a day (TID) | INTRAVENOUS | Status: AC
  Administered 2022-04-24 – 2022-04-25 (×2): 2 g via INTRAVENOUS
  Filled 2022-04-24 (×2): qty 100

## 2022-04-24 MED ORDER — DIAZEPAM 5 MG PO TABS
5.0000 mg | ORAL_TABLET | Freq: Four times a day (QID) | ORAL | Status: DC | PRN
  Administered 2022-04-25 – 2022-04-26 (×3): 5 mg via ORAL
  Filled 2022-04-24 (×3): qty 1

## 2022-04-24 MED ORDER — ACETAMINOPHEN 325 MG PO TABS
650.0000 mg | ORAL_TABLET | ORAL | Status: DC | PRN
  Administered 2022-04-24 – 2022-04-28 (×3): 650 mg via ORAL
  Filled 2022-04-24 (×3): qty 2

## 2022-04-24 MED ORDER — THROMBIN 20000 UNITS EX SOLR
CUTANEOUS | Status: DC | PRN
  Administered 2022-04-24 (×2): 20 mL via TOPICAL

## 2022-04-24 MED ORDER — ZONISAMIDE 100 MG PO CAPS
300.0000 mg | ORAL_CAPSULE | Freq: Every day | ORAL | Status: DC
  Administered 2022-04-25: 300 mg via ORAL
  Filled 2022-04-24 (×2): qty 3

## 2022-04-24 MED ORDER — FENTANYL CITRATE (PF) 250 MCG/5ML IJ SOLN
INTRAMUSCULAR | Status: AC
  Filled 2022-04-24: qty 5

## 2022-04-24 MED ORDER — ONDANSETRON HCL 4 MG PO TABS: 4.0000 mg | ORAL_TABLET | Freq: Four times a day (QID) | ORAL | Status: DC | PRN

## 2022-04-24 MED ORDER — POTASSIUM CHLORIDE IN NACL 20-0.9 MEQ/L-% IV SOLN
INTRAVENOUS | Status: DC
  Filled 2022-04-24 (×2): qty 1000

## 2022-04-24 MED ORDER — INSULIN ASPART 100 UNIT/ML IJ SOLN
0.0000 [IU] | INTRAMUSCULAR | Status: DC
  Administered 2022-04-24: 5 [IU] via SUBCUTANEOUS
  Administered 2022-04-25: 2 [IU] via SUBCUTANEOUS
  Administered 2022-04-25: 8 [IU] via SUBCUTANEOUS
  Administered 2022-04-25: 3 [IU] via SUBCUTANEOUS
  Administered 2022-04-25: 11 [IU] via SUBCUTANEOUS
  Administered 2022-04-25 – 2022-04-26 (×3): 2 [IU] via SUBCUTANEOUS
  Administered 2022-04-26: 3 [IU] via SUBCUTANEOUS
  Administered 2022-04-26: 2 [IU] via SUBCUTANEOUS
  Administered 2022-04-27 (×2): 3 [IU] via SUBCUTANEOUS
  Administered 2022-04-27 (×2): 2 [IU] via SUBCUTANEOUS
  Administered 2022-04-28: 3 [IU] via SUBCUTANEOUS
  Administered 2022-04-28 – 2022-04-29 (×5): 2 [IU] via SUBCUTANEOUS
  Administered 2022-04-29 (×2): 3 [IU] via SUBCUTANEOUS

## 2022-04-24 MED ORDER — ATORVASTATIN CALCIUM 80 MG PO TABS
80.0000 mg | ORAL_TABLET | Freq: Every day | ORAL | Status: DC
  Administered 2022-04-24 – 2022-04-28 (×5): 80 mg via ORAL
  Filled 2022-04-24 (×5): qty 1

## 2022-04-24 MED ORDER — PHENYLEPHRINE 80 MCG/ML (10ML) SYRINGE FOR IV PUSH (FOR BLOOD PRESSURE SUPPORT)
PREFILLED_SYRINGE | INTRAVENOUS | Status: DC | PRN
  Administered 2022-04-24 (×2): 80 ug via INTRAVENOUS
  Administered 2022-04-24: 240 ug via INTRAVENOUS
  Administered 2022-04-24: 160 ug via INTRAVENOUS
  Administered 2022-04-24 (×5): 80 ug via INTRAVENOUS

## 2022-04-24 MED ORDER — DEXMEDETOMIDINE (PRECEDEX) IN NS 20 MCG/5ML (4 MCG/ML) IV SYRINGE
PREFILLED_SYRINGE | INTRAVENOUS | Status: DC | PRN
  Administered 2022-04-24: 8 ug via INTRAVENOUS
  Administered 2022-04-24: 12 ug via INTRAVENOUS

## 2022-04-24 MED ORDER — ONDANSETRON HCL 4 MG/2ML IJ SOLN: 4.0000 mg | Freq: Four times a day (QID) | INTRAMUSCULAR | Status: DC | PRN

## 2022-04-24 MED ORDER — CEFAZOLIN SODIUM-DEXTROSE 2-4 GM/100ML-% IV SOLN
2.0000 g | INTRAVENOUS | Status: AC
  Administered 2022-04-24 (×2): 2 g via INTRAVENOUS
  Filled 2022-04-24: qty 100

## 2022-04-24 MED ORDER — LACTATED RINGERS IV SOLN: INTRAVENOUS | Status: DC | PRN

## 2022-04-24 MED ORDER — IMIPRAMINE PAMOATE 75 MG PO CAPS
150.0000 mg | ORAL_CAPSULE | Freq: Every day | ORAL | Status: DC
  Administered 2022-04-25: 150 mg via ORAL
  Filled 2022-04-24: qty 2
  Filled 2022-04-24: qty 1
  Filled 2022-04-24 (×2): qty 2

## 2022-04-24 SURGICAL SUPPLY — 83 items
ADH SKN CLS APL DERMABOND .7 (GAUZE/BANDAGES/DRESSINGS) ×1
APL SKNCLS STERI-STRIP NONHPOA (GAUZE/BANDAGES/DRESSINGS)
BAG COUNTER SPONGE SURGICOUNT (BAG) ×1 IMPLANT
BAG SPNG CNTER NS LX DISP (BAG) ×2
BASKET BONE COLLECTION (BASKET) IMPLANT
BENZOIN TINCTURE PRP APPL 2/3 (GAUZE/BANDAGES/DRESSINGS) IMPLANT
BIT DRILL PLIF MAS DISP 5.5MM (DRILL) IMPLANT
BLADE BONE MILL FINE (MISCELLANEOUS) ×1
BLADE BONE MILL MEDIUM (MISCELLANEOUS) ×1 IMPLANT
BLADE CLIPPER SURG (BLADE) IMPLANT
BLADE MILL BN FN STRL DISP (MISCELLANEOUS) IMPLANT
BUR BARREL STRAIGHT FLUTE 4.0 (BURR) IMPLANT
BUR MATCHSTICK NEURO 3.0 LAGG (BURR) ×1 IMPLANT
BUR PRECISION FLUTE 5.0 (BURR) ×1 IMPLANT
CANISTER SUCT 3000ML PPV (MISCELLANEOUS) ×1 IMPLANT
CAP RELINE MOD TULIP RMM (Cap) IMPLANT
CARTRIDGE OIL MAESTRO DRILL (MISCELLANEOUS) ×1 IMPLANT
CNTNR URN SCR LID CUP LEK RST (MISCELLANEOUS) ×1 IMPLANT
CONT SPEC 4OZ STRL OR WHT (MISCELLANEOUS) ×1
COVER BACK TABLE 60X90IN (DRAPES) IMPLANT
DERMABOND ADVANCED (GAUZE/BANDAGES/DRESSINGS) ×1
DERMABOND ADVANCED .7 DNX12 (GAUZE/BANDAGES/DRESSINGS) ×1 IMPLANT
DIFFUSER DRILL AIR PNEUMATIC (MISCELLANEOUS) ×1 IMPLANT
DRAPE C-ARM 42X72 X-RAY (DRAPES) ×2 IMPLANT
DRAPE C-ARMOR (DRAPES) IMPLANT
DRAPE LAPAROTOMY 100X72X124 (DRAPES) ×1 IMPLANT
DRAPE SURG 17X23 STRL (DRAPES) ×1 IMPLANT
DRILL PLIF MAS DISP 5.5MM (DRILL) ×1
DRSG OPSITE POSTOP 4X10 (GAUZE/BANDAGES/DRESSINGS) IMPLANT
DRSG OPSITE POSTOP 4X8 (GAUZE/BANDAGES/DRESSINGS) IMPLANT
DURAPREP 26ML APPLICATOR (WOUND CARE) ×1 IMPLANT
ELECT REM PT RETURN 9FT ADLT (ELECTROSURGICAL) ×1
ELECTRODE REM PT RTRN 9FT ADLT (ELECTROSURGICAL) ×1 IMPLANT
GAUZE 4X4 16PLY ~~LOC~~+RFID DBL (SPONGE) IMPLANT
GAUZE SPONGE 4X4 12PLY STRL (GAUZE/BANDAGES/DRESSINGS) IMPLANT
GLOVE BIOGEL PI IND STRL 7.0 (GLOVE) IMPLANT
GLOVE BIOGEL PI IND STRL 7.5 (GLOVE) IMPLANT
GLOVE BIOGEL PI IND STRL 8 (GLOVE) IMPLANT
GLOVE ECLIPSE 6.5 STRL STRAW (GLOVE) ×2 IMPLANT
GLOVE ECLIPSE 7.0 STRL STRAW (GLOVE) IMPLANT
GLOVE EXAM NITRILE XL STR (GLOVE) IMPLANT
GLOVE SURG SS PI 7.0 STRL IVOR (GLOVE) IMPLANT
GLOVE SURG SS PI 7.5 STRL IVOR (GLOVE) IMPLANT
GOWN STRL REUS W/ TWL LRG LVL3 (GOWN DISPOSABLE) ×2 IMPLANT
GOWN STRL REUS W/ TWL XL LVL3 (GOWN DISPOSABLE) IMPLANT
GOWN STRL REUS W/TWL 2XL LVL3 (GOWN DISPOSABLE) IMPLANT
GOWN STRL REUS W/TWL LRG LVL3 (GOWN DISPOSABLE) ×3
GOWN STRL REUS W/TWL XL LVL3 (GOWN DISPOSABLE) ×1
GRAFT DURAGEN MATRIX 1WX1L (Tissue) IMPLANT
KIT BASIN OR (CUSTOM PROCEDURE TRAY) ×1 IMPLANT
KIT POSITION SURG JACKSON T1 (MISCELLANEOUS) ×1 IMPLANT
KIT TURNOVER KIT B (KITS) ×1 IMPLANT
MILL BONE PREP (MISCELLANEOUS) IMPLANT
NDL HYPO 25X1 1.5 SAFETY (NEEDLE) ×1 IMPLANT
NDL SPNL 18GX3.5 QUINCKE PK (NEEDLE) IMPLANT
NEEDLE HYPO 25X1 1.5 SAFETY (NEEDLE) ×1 IMPLANT
NEEDLE SPNL 18GX3.5 QUINCKE PK (NEEDLE) IMPLANT
NS IRRIG 1000ML POUR BTL (IV SOLUTION) ×1 IMPLANT
OIL CARTRIDGE MAESTRO DRILL (MISCELLANEOUS) ×1
PACK LAMINECTOMY NEURO (CUSTOM PROCEDURE TRAY) ×1 IMPLANT
PAD ARMBOARD 7.5X6 YLW CONV (MISCELLANEOUS) ×3 IMPLANT
PATTIES SURGICAL .75X.75 (GAUZE/BANDAGES/DRESSINGS) IMPLANT
ROD COCR RELINE LORD 5X85 (Rod) IMPLANT
ROD RELINE O COCR 5.0X90MM (Rod) IMPLANT
SCREW LOCK RSS 4.5/5.0MM (Screw) IMPLANT
SCREW SHANK RELINE MOD 5.5X35 (Screw) IMPLANT
SCREW SHANK RELINE MOD 6.5X35 (Screw) IMPLANT
SEALANT ADHERUS EXTEND TIP (MISCELLANEOUS) IMPLANT
SPACER EXP PROLIFT 8X28X10 (Spacer) IMPLANT
SPACER EXP PROLIFT 8X28X8 8D (Spacer) IMPLANT
SPIKE FLUID TRANSFER (MISCELLANEOUS) ×1 IMPLANT
SPONGE SURGIFOAM ABS GEL 100 (HEMOSTASIS) ×1 IMPLANT
SPONGE T-LAP 4X18 ~~LOC~~+RFID (SPONGE) IMPLANT
STRIP CLOSURE SKIN 1/2X4 (GAUZE/BANDAGES/DRESSINGS) IMPLANT
SUT ETHILON 3 0 FSL (SUTURE) IMPLANT
SUT PROLENE 6 0 BV (SUTURE) IMPLANT
SUT VIC AB 0 CT1 18XCR BRD8 (SUTURE) ×1 IMPLANT
SUT VIC AB 0 CT1 8-18 (SUTURE) ×1
SUT VIC AB 2-0 CT1 18 (SUTURE) ×1 IMPLANT
SUT VIC AB 3-0 SH 8-18 (SUTURE) ×1 IMPLANT
TOWEL GREEN STERILE (TOWEL DISPOSABLE) ×1 IMPLANT
TOWEL GREEN STERILE FF (TOWEL DISPOSABLE) ×1 IMPLANT
WATER STERILE IRR 1000ML POUR (IV SOLUTION) ×1 IMPLANT

## 2022-04-24 NOTE — Op Note (Signed)
BP (!) 82/67 (BP Location: Left Arm)   Pulse 94   Temp 97.8 F (36.6 C)   Resp (!) 9   Ht 5\' 8"  (1.727 m)   Wt 81.6 kg   SpO2 96%   BMI 27.37 kg/m  04/24/2022  7:49 PM  PATIENT:  06/24/2022  71 y.o. male  PRE-OPERATIVE DIAGNOSIS:  Spinal stenosis, lumbar region with neurogenic claudication L3/4,4/5,5/S1 POST-OPERATIVE DIAGNOSIS:  Spinal stenosis, lumbar region with neurogenic claudication L3/4,4/5,5/S1 PROCEDURE:  Procedure(s): Lumbar three-four Lumbar four-five Lumbar five-Sacral one Bilateral Posterior Lumbar Interbody Fusion Stryker cages packed with autograft morsels. Disc spaces packed with autograft morsels Laminectomies of L4,5, hemilaminectomies L3,S1 beyond the needed exposure for Plifs at 3/4,4/5,and 5/S1 Inferior facetectomies L3,4,and 5, partial superior facetectomies L4, L5, and S1 Posterior segmental pedicle screw fixation L3-S1(nuvasive relign screws) SURGEON:  Surgeon(s): 09-27-1993, MD Coletta Memos, MD  ASSISTANTS:Nundkumar, Lisbeth Renshaw  ANESTHESIA:   general  EBL:  Total I/O In: 650 [I.V.:650] Out: -   BLOOD ADMINISTERED: 900 CC CELLSAVER  CELL SAVER GIVEN:yes  COUNT:per nursing  DRAINS: none   SPECIMEN:  No Specimen  DICTATION: Arthur Smith is a 71 y.o. male whom was taken to the operating room intubated, and placed under a general anesthetic without difficulty. A foley catheter was placed under sterile conditions. He was positioned prone on a Jackson table with all pressure points properly padded.  His lumbar region was prepped and draped in a sterile manner. I infiltrated 20cc's 1/2%lidocaine/1:2000,000 strength epinephrine into the planned incision. I opened the skin with a 10 blade and took the incision down to the thoracolumbar fascia. I  exposed the lamina of L2,3,4,5,and S1 in a subperiosteal fashion bilaterally. I confirmed my location with an intraoperative xray.  I placed self retaining retractors and started the decompression.  I performed a hemilaminectomy of L3 and S1. I performed inferior facetectomies of L3, 4, and 5. I performed partial superior facetectomies of L4,5, and S1. I decompressed the spinal canal, and the foramina of L3/4,4/5, and L5/S1 bilaterally. The lateral recesses were also decompressed at each level. The decompression was far in excess of the needed exposure for a PLIF.   PLIF's were performed at L3/4,4/5, and 5/S1 in the same fashion. I opened the disc space with a 15 blade then used a variety of instruments to remove the disc and prepare the space for the arthrodesis. I used curettes, rongeurs, punches, shavers for the disc space, and rasps in the discetomy. I measured the disc spaces and placed six titanium cages packed with autograft morsels(Stryker) into the disc space(s).  We placed pedicle screws at L3,4,5,and S1, using fluoroscopic guidance. I drilled a pilot hole, then cannulated the pedicle with a drill at each site. We then tapped each pedicle, assessing each site for pedicle violations. No cutouts were appreciated. Screws (Nuvasive relign) were then placed at each site without difficulty. We attached rods and locking caps with the appropriate tools. The locking caps were secured with torque limited screwdrivers. Final films were performed and the final construct appeared to be in good position.  I closed the wound in a layered fashion. I approximated the thoracolumbar fascia, subcutaneous, and subcuticular planes with vicryl sutures. I used dermabond and an occlusive bandage for a sterile dressing.  I closed a dural rent with prolene, and duraseal.    PLAN OF CARE: Admit to inpatient   PATIENT DISPOSITION:  PACU - hemodynamically stable.   Delay start of Pharmacological VTE agent (>24hrs) due to  surgical blood loss or risk of bleeding:  yes

## 2022-04-24 NOTE — Transfer of Care (Signed)
Immediate Anesthesia Transfer of Care Note  Patient: Arthur Smith  Procedure(s) Performed: Lumbar three-four Lumbar four-five Lumbar five-Sacral one Bilateral Posterior Lumbar Interbody Fusion (Bilateral)  Patient Location: PACU  Anesthesia Type:General  Level of Consciousness: awake, alert , oriented and patient cooperative  Airway & Oxygen Therapy: Patient Spontanous Breathing  Post-op Assessment: Report given to RN, Post -op Vital signs reviewed and stable and Patient moving all extremities X 4  Post vital signs: Reviewed and stable  Last Vitals:  Vitals Value Taken Time  BP 90/48 04/24/22 1916  Temp    Pulse 95 04/24/22 1924  Resp 12 04/24/22 1924  SpO2 97 % 04/24/22 1924  Vitals shown include unvalidated device data.  Last Pain:  Vitals:   04/24/22 0704  TempSrc:   PainSc: 0-No pain         Complications: No notable events documented.

## 2022-04-24 NOTE — Anesthesia Procedure Notes (Signed)
Procedure Name: Intubation Date/Time: 04/24/2022 9:27 AM  Performed by: Lynnell Chad, CRNAPre-anesthesia Checklist: Patient identified, Emergency Drugs available, Suction available and Patient being monitored Patient Re-evaluated:Patient Re-evaluated prior to induction Oxygen Delivery Method: Circle System Utilized Preoxygenation: Pre-oxygenation with 100% oxygen Induction Type: IV induction Ventilation: Mask ventilation without difficulty Laryngoscope Size: Miller and 2 Grade View: Grade I Tube type: Oral Tube size: 7.5 mm Number of attempts: 1 Airway Equipment and Method: Stylet and Oral airway Placement Confirmation: ETT inserted through vocal cords under direct vision, positive ETCO2 and breath sounds checked- equal and bilateral Secured at: 21 cm Tube secured with: Tape Dental Injury: Teeth and Oropharynx as per pre-operative assessment

## 2022-04-24 NOTE — H&P (Signed)
BP 133/79   Pulse 82   Temp 98.8 F (37.1 C) (Oral)   Resp 18   Ht 5\' 8"  (1.727 m)   Wt 81.6 kg   SpO2 96%   BMI 27.37 kg/m      Arthur Smith is a former patient of Arthur Smith who performed 3 operations.  Arthur Smith did decompressions at 4-5, and I think 5 and 1.  Arthur Smith did another procedure at 3-4.  Arthur Smith did a lower thoracic procedure for myelopathy and stenosis.  Arthur Smith comes in today because Arthur Smith is having a significant amount of pain whenever Arthur Smith stands or walks.  Arthur Smith finds his legs not necessarily giving out, but just so painful that Arthur Smith does not wish to stand.  If Arthur Smith is lying down Arthur Smith does not have this pain.  Arthur Smith also fell 2 years ago climbing on a ladder and the ladder gave way, fell back and Arthur Smith landed on his back.  This fall was witnessed by his wife.  Ever since that time, Arthur Smith has had neck pain, especially on the right side.  It does not radiate into the extremities.  Vital signs:  Arthur Smith is 5 feet 8 inch in height.  Arthur Smith weighs 182 pounds.  Temperature is 98.2, blood pressure 111/73, pulse 52, respiratory rate 20. Pain is 4/10.  Arthur Smith is currently retired, is right-handed, was a former smoker.  Arthur Smith had a history of alcohol abuse.  Arthur Smith is 71 years of age.  Arthur Smith does have a history of hypertension, myocardial infarction, diabetes.  Arthur Smith has had the 3 operations performed by Dr. 66.  Arthur Smith also has 4 stents placed secondary to coronary artery disease.  Arthur Smith takes Gabapentin, Atorvastatin, Tamsulosin, Pantoprazole, Zonisamide, Plavix, Metoprolol, Imipraine.  Arthur Smith are both deceased.  In his words Arthur Smith has sacral pain, left foot affecting mobility, posterior neck pain, right shoulder pain.  Arthur Smith fell in 2021.  The symptoms have worsened.  Arthur Smith has pain in his neck, legs, and back.  Heat relieves the pain.  His back pain is 5/10 with increased mobility is 8/10 and with his neck is 6/10.  His pain affects his mobility, making him far more sedentary.  Arthur Smith has weakness bilaterally in the lower extremities, numbness  and tingling in both feet and lower legs.  Arthur Smith has benign prostatic hypertrophy and says Arthur Smith has loss of control of his bowel and bladder.  Arthur Smith had lost 55 pounds after myocardial infarction and diagnosis of diabetes.     REVIEW OF SYSTEMS :  Positive for hearing loss, tinnitus, leg pain with walking, difficulty with urinary strength, neck pain, arm weakness, leg weakness, back pain, arm pain, leg pain, problems with coordination in his lower extremities.  Arthur Smith is on Plavix and has food allergies.     PHYSICAL EXAMINATION :  On exam, Arthur Smith is alert, oriented x4.  Arthur Smith answers all questions appropriately.  Memory, language, attention span, and fund of knowledge are normal. Speech is clear, it is also fluent.  Hearing intact to voice.  Normal strength, 5/5 in both the upper and lower extremities on manual exam.  Arthur Smith can squat, toe walk and heel walk.  2+ reflexes, biceps, triceps, brachioradialis, knees, and ankles.   Arthur Smith returns today for an MRI of the cervical spine and lumbar spine.  In the cervical spine Arthur Smith is stenotic at 3-4, 4-5, and 5-1.  Arthur Smith has severe degenerative disc changes at 4-5 and at 5 6.  Arthur Smith is stenotic.  Arthur Smith  has severe lateral recess stenosis present at 4-5 and at 5 6.  In the lumbar spine, Arthur Smith has severe stenosis and arthropathic changes at 3-4, 4-5 and 5 1.  The lateral recess stenosis and foraminal narrowing is severe at 3-4, 4-5, 5-6, especially on the left side.  Arthur Smith has had laminectomies previously at 4-5 and 5-1.  We spoke at great length with regard to the operation with how long it would take, the fact that it was going to be bilateral.  Arthur Smith does understand the significant amount of pain postop and is it going to take a few months, 4-6, before Arthur Smith may be realize that it was a worthwhile endeavor.  We are below the cord, so there is no real chance of paralysis.  We did speak about all these steps.  Arthur Smith would like to proceed with the case.  Risks and benefits, bleeding, infection,  no relief, weakness in the feet, weakness in the bowel and bladder function, or dysfunction, continued pain, hardware failure, fusion failure, all were discussed.  Arthur Smith understands and wishes to proceed.

## 2022-04-24 NOTE — Anesthesia Procedure Notes (Signed)
Arterial Line Insertion Start/End9/03/2022 9:32 AM, 04/24/2022 9:37 AM Performed by: Collene Schlichter, MD, anesthesiologist  Patient location: OR. Preanesthetic checklist: patient identified, IV checked, site marked, risks and benefits discussed, surgical consent, monitors and equipment checked, pre-op evaluation, timeout performed and anesthesia consent Lidocaine 1% used for infiltration Right, radial was placed Catheter size: 20 G Hand hygiene performed  and maximum sterile barriers used   Attempts: 2 Procedure performed without using ultrasound guided technique. Following insertion, dressing applied and Biopatch. Post procedure assessment: normal and unchanged  Patient tolerated the procedure well with no immediate complications.

## 2022-04-24 NOTE — Consult Note (Addendum)
NAME:  Arthur Smith, MRN:  [id number], DOB:  1951-04-05, LOS: 0 ADMISSION DATE:  04/24/2022, CONSULTATION DATE:  9/8 REFERRING MD:  Dr. Christella Noa, CHIEF COMPLAINT:  s/p lumbar fusion; abla   History of Present Illness:  Patient is a 71 yo Male w/ pertinent PMH of spinal stemosis, lumbar decompression 3-4, 4-5, 5-sacral 1 presents to [facility] for lumbar fusion surgery by NSG.   Patient has been having back pain with walking and standing. MRI showing stensosis at L 3-4, L 4-5, L5-S1. Coming in today on 9/8 for elective lumbar fusion procedure by Dr. Christella Noa.  During procedure patient had drop in BP likely due to blood loss. Patient given blood transfusion. Patient extubated post procedure. Neo started. BP improved with transfusion and fluids. NSG transferring to ICU overnight for closer monitoring. PCCM consulted.  Pertinent  Medical History   Past Medical History:  Diagnosis Date   Arthritis    BPH without obstruction/lower urinary tract symptoms    Bronchitis with asthma, acute 11/2011   CAD S/P percutaneous coronary angioplasty 11/15/2018   Severe 2 vessel obstructive CAD:. Ost-pCx 95% (DES PCI  STENT SYNERGY DES 3.5X28) &p-mCx 100% (DES PCI STENT SYNERGY DES 2.25X32). pRCA 90%, mRCA 85% & rPDA 90%.dLAD ~40%.  EF 55 to 60%.  Moderately elevated LVEDP.  No R WMA.  Plan staged PCI to 3 RCA-PDA lesions 4/1   Depression    denies   Diabetes mellitus without complication (HCC)    Type II- Diet controlled   Frequency of urination    URINATION AT NIGHT, PAINFUL SLOW STREAM    GERD (gastroesophageal reflux disease)    occ   Hyperlipidemia    Neuromuscular disorder (HCC)    DIZZINESS   PERIPHERAL NEUROPATHY    Neuropathy    No pertinent past medical history    RINGING RIGHT EAR   NSTEMI (non-ST elevated myocardial infarction) ([place]) 11/13/2018   Recurrent upper respiratory infection (URI)    SOB CURRENT COLD   Shortness of breath    d/t "cold" 12/02/11     Significant Hospital  Events: Including procedures, antibiotic start and stop dates in addition to other pertinent events   9/8: spinal stenosis s/p posterior lumbar interbody fusion; going to ICU post procedure for ABLA/hemorrhagic shock  Interim History / Subjective:  On room air Off neo  Objective   Blood pressure 117/76, pulse 89, temperature 97.8 F (36.6 C), resp. rate (!) 0, height '5\' 8"'  (1.727 m), weight 81.6 kg, SpO2 100 %.        Intake/Output Summary (Last 24 hours) at 04/24/2022 2031 Last data filed at 04/24/2022 1926 Gross per 24 hour  Intake 6540 ml  Output 2700 ml  Net 3840 ml   Filed Weights   04/24/22 0634  Weight: 81.6 kg    Examination: General:  pale appearing male in nad HEENT: MM pink/moist Neuro: Aox3; MAE CV: s1s2, RRR, no m/r/g PULM:  dim clear BS bilaterally; on room air GI: soft, bsx4 active  Extremities: warm/dry, no edema; some numbness/tingling in RLE which is chronic Skin: no rashes or lesions appreciated   Resolved Hospital Problem list     Assessment & Plan:  Spinal stenosis s/p posterior lumbar interbody fusion: Lumbar 3-4, Lumbar 4-5, Lumbar 5-Sacral 1 P: -per NSG -IS -pain management -cefazolin surgical ppx -pt/ot per nsg  ABLA Hypotension P: -weaned off neo -transfusion given -MAP goal > 65 -repeat cbc pending -SCD dvt ppx -trend cbc and transfuse for hgb < 7 or hemodynamically  unstable  Bronchitis w/ asthma P: -prn albuterol for wheezing  CAD HLD P: -continue statin -consider resuming ASA and plavix in am if hgb stable -hold metoprolol given episode of hypotension  DMT2? -no home meds listed P: -check a1c -cbg monitoring; will hold on ssi  BPH P: -resume home finasteride and flomax   GERD P: -PPI  Peripheral neuropathy P: -hold home gabapentin given hypotension  Depression P: -continue imipramine   Best Practice (right click and "Reselect all SmartList Selections" daily)   Diet/type: advance diet as  tolerated DVT prophylaxis: SCD GI prophylaxis: PPI Lines: N/A Foley:  Yes, and it is still needed Code Status:  full code Last date of multidisciplinary goals of care discussion [per primary]  Labs   CBC: Recent Labs  Lab 04/24/22 1410 04/24/22 1441 04/24/22 1615 04/24/22 1757 04/24/22 1927  HGB 12.6* 12.2* 11.6* 10.5* 10.2*  HCT 37.0* 36.0* 34.0* 31.0* 30.0*    Basic Metabolic Panel: Recent Labs  Lab 04/24/22 1410 04/24/22 1441 04/24/22 1615 04/24/22 1757 04/24/22 1927  NA 138 139 139 139 139  K 3.8 3.9 3.9 3.8 4.1   GFR: Estimated Creatinine Clearance: 59.4 mL/min (by C-G formula based on SCr of 1.12 mg/dL). No results for input(s): "PROCALCITON", "WBC", "LATICACIDVEN" in the last 168 hours.  Liver Function Tests: No results for input(s): "AST", "ALT", "ALKPHOS", "BILITOT", "PROT", "ALBUMIN" in the last 168 hours. No results for input(s): "LIPASE", "AMYLASE" in the last 168 hours. No results for input(s): "AMMONIA" in the last 168 hours.  ABG    Component Value Date/Time   PHART 7.308 (L) 04/24/2022 1927   PCO2ART 43.1 04/24/2022 1927   PO2ART 82 (L) 04/24/2022 1927   HCO3 21.7 04/24/2022 1927   TCO2 23 04/24/2022 1927   ACIDBASEDEF 5.0 (H) 04/24/2022 1927   O2SAT 95 04/24/2022 1927     Coagulation Profile: No results for input(s): "INR", "PROTIME" in the last 168 hours.  Cardiac Enzymes: No results for input(s): "CKTOTAL", "CKMB", "CKMBINDEX", "TROPONINI" in the last 168 hours.  HbA1C: Hgb A1c MFr Bld  Date/Time Value Ref Range Status  04/17/2022 03:14 PM 5.9 (H) 4.8 - 5.6 % Final    Comment:    (NOTE) Pre diabetes:          5.7%-6.4%  Diabetes:              >6.4%  Glycemic control for   <7.0% adults with diabetes   12/02/2019 08:35 AM 9.6 (H) 4.8 - 5.6 % Final    Comment:    (NOTE) Pre diabetes:          5.7%-6.4% Diabetes:              >6.4% Glycemic control for   <7.0% adults with diabetes     CBG: Recent Labs  Lab  04/24/22 0637 04/24/22 1915  GLUCAP 111* 149*    Review of Systems:   Review of Systems  Constitutional:  Negative for fever.  Respiratory:  Negative for cough, shortness of breath and wheezing.   Cardiovascular:  Negative for chest pain.  Gastrointestinal:  Negative for constipation, diarrhea, nausea and vomiting.     Past Medical History:  He,  has a past medical history of Arthritis, BPH without obstruction/lower urinary tract symptoms, Bronchitis with asthma, acute (11/2011), CAD S/P percutaneous coronary angioplasty (11/15/2018), Depression, Diabetes mellitus without complication ([place]), Frequency of urination, GERD (gastroesophageal reflux disease), Hyperlipidemia, Neuromuscular disorder ([place]), Neuropathy, No pertinent past medical history, NSTEMI (non-ST elevated myocardial infarction) ([place]) (11/13/2018),  Recurrent upper respiratory infection (URI), and Shortness of breath.   Surgical History:   Past Surgical History:  Procedure Laterality Date   BACK SURGERY     CARDIAC CATHETERIZATION     CORONARY STENT INTERVENTION N/A 11/15/2018   Procedure: CORONARY STENT INTERVENTION;  Surgeon: Martinique, Peter M, MD;  Location: [facility] INVASIVE CV LAB;;  Ost-pCx 95% (DES PCI  STENT SYNERGY DES 3.5X28) &p-mCx 100% (DES PCI STENT SYNERGY DES 2.25X32)   CORONARY STENT INTERVENTION N/A 11/16/2018   Procedure: CORONARY STENT INTERVENTION;  Surgeon: Belva Crome, MD;  Location: [place] CV LAB;  Service: Cardiovascular;  Laterality: N/A;   FINGER SURGERY     RIGHT MIDDLE FINGER   HERNIA REPAIR     UMBILICAL HERNIA [id number]   LEFT HEART CATH AND CORONARY ANGIOGRAPHY N/A 11/15/2018   Procedure: LEFT HEART CATH AND CORONARY ANGIOGRAPHY;  Surgeon: Martinique, Peter M, MD;  Location: [facility] INVASIVE CV LAB;; Severe 2 vessel obstructive CAD:. Ost-pCx 95% (DES PCI) &p-mCx 100% (DES PCI). pRCA 90%, mRCA 85% & rPDA 90%.dLAD ~40%.  EF 55 to 60%.  Moderately elevated LVEDP.  No R WMA.  -- Plan staged PCI to 3 RCA-PDA  lesions 4/1   LUMBAR LAMINECTOMY/DECOMPRESSION MICRODISCECTOMY  12/22/2011   Procedure: LUMBAR LAMINECTOMY/DECOMPRESSION MICRODISCECTOMY 1 LEVEL;  Surgeon: Erline Levine, MD;  Location: [place] NEURO ORS;  Service: Neurosurgery;  Laterality: N/A;  Thoracic ten-eleven laminectomy   LUMBAR LAMINECTOMY/DECOMPRESSION MICRODISCECTOMY Right 05/01/2014   Procedure: Right Lumbar three-four Microdiskectomy;  Surgeon: Erline Levine, MD;  Location: [place] NEURO ORS;  Service: Neurosurgery;  Laterality: Right;   LUMBAR LAMINECTOMY/DECOMPRESSION MICRODISCECTOMY Right 11/08/2014   Procedure: Right L4-5 L5-S1 Laminectomy;  Surgeon: Erline Levine, MD;  Location: [place] NEURO ORS;  Service: Neurosurgery;  Laterality: Right;  Right L4-5 L5-S1 Laminectomy   MULTIPLE TOOTH EXTRACTIONS       Social History:   reports that he quit smoking about 38 years ago. His smoking use included cigarettes. He has a 30.00 pack-year smoking history. He has never used smokeless tobacco. He reports current alcohol use. He reports that he does not use drugs.   Family History:  His family history includes Cancer in his mother; Neuropathy in his brother; Stroke in his father.   Allergies Allergies  Allergen Reactions   Eggs Or Egg-Derived Products Diarrhea    Egg yolks cause stomach cramps and diarrhea   Lactose Intolerance (Gi) Diarrhea    Causes stomach cramps and diarrhea     Home Medications  Prior to Admission medications   Medication Sig Start Date End Date Taking? Authorizing Provider  Alpha-D-Galactosidase Satira Mccallum) TABS Take 1 tablet by mouth daily as needed (flatulence).   Yes [provider]  aspirin EC 81 MG tablet Take 1 tablet (81 mg total) by mouth See admin instructions. 11/17/18  Yes Cheryln Manly, NP  atorvastatin (LIPITOR) 80 MG tablet Take 1 tablet (80 mg total) by mouth at bedtime. 02/08/19  Yes Croitoru, Mihai, MD  B Complex Vitamins (VITAMIN B COMPLEX) TABS Take 1 tablet by mouth daily.   Yes [provider]  cholecalciferol (VITAMIN D3) 25 MCG (1000 UT) tablet Take 1,000 Units by mouth daily.   Yes [provider]  clopidogrel (PLAVIX) 75 MG tablet Take 1 tablet (75 mg total) by mouth daily. 07/09/21  Yes Croitoru, Mihai, MD  Cyanocobalamin 2500 MCG TABS Take 2,500 mcg by mouth daily.   Yes [provider]  ferrous sulfate 325 (65 FE) MG EC tablet Take 325 mg by  mouth daily.   Yes [provider]  finasteride (PROSCAR) 5 MG tablet Take 5 mg by mouth daily.   Yes [provider]  fluticasone (FLONASE) 50 MCG/ACT nasal spray Place 2 sprays into both nostrils daily as needed for allergies. 12/04/11  Yes [provider]  gabapentin (NEURONTIN) 800 MG tablet Take 1 tablet (800 mg total) by mouth 4 (four) times daily. 01/08/22  Yes Jennye Boroughs, MD  GARLIC PO Take 1 capsule by mouth daily.   Yes [provider]  imipramine (TOFRANIL-PM) 150 MG capsule TAKE 1 CAPSULE BY MOUTH AT BEDTIME. 07/07/21  Yes Sater, Nanine Means, MD  metoprolol succinate (TOPROL-XL) 25 MG 24 hr tablet TAKE 1 TABLET (25 MG TOTAL) BY MOUTH DAILY. 04/02/22  Yes Croitoru, Mihai, MD  Omega-3 Fatty Acids (FISH OIL) 1000 MG CAPS Take 1,000 mg by mouth daily.   Yes [provider]  pantoprazole (PROTONIX) 40 MG tablet Take 40 mg by mouth daily.   Yes [provider]  senna-docusate (SENOKOT-S) 8.6-50 MG tablet Take 2 tablets by mouth at bedtime as needed (constipation).    Yes [provider]  tamsulosin (FLOMAX) 0.4 MG CAPS capsule Take 0.4 mg by mouth every 12 (twelve) hours. 12/05/20  Yes [provider]  zonisamide (ZONEGRAN) 100 MG capsule TAKE 3 AT NIGHT Patient taking differently: Take 300 mg by mouth daily at 6 PM. 02/04/22  Yes Suzzanne Cloud, NP  albuterol (VENTOLIN HFA) 108 (90 Base) MCG/ACT inhaler Inhale 2 puffs into the lungs every 6 (six) hours as needed for wheezing or shortness of breath.    [provider]  blood  glucose meter kit and supplies Dispense based on patient and insurance preference. Use up to four times daily as directed. (FOR ICD-10 E10.9, E11.9). 12/03/19   Alma Friendly, MD  nitroGLYCERIN (NITROSTAT) 0.4 MG SL tablet Place 1 tablet (0.4 mg total) under the tongue every 5 (five) minutes as needed for chest pain. DON'T take within 24 hrs of Viagra 11/17/18   Cheryln Manly, NP         JD Rexene Agent [facility] 04/24/2022, 8:31 PM  Please see [email or web address] for pager details.  From 7A-7P if no response, please call [phone]. After hours, please call ELink [phone].

## 2022-04-25 DIAGNOSIS — M545 Low back pain, unspecified: Secondary | ICD-10-CM | POA: Diagnosis not present

## 2022-04-25 LAB — GLUCOSE, CAPILLARY
Glucose-Capillary: 121 mg/dL — ABNORMAL HIGH (ref 70–99)
Glucose-Capillary: 144 mg/dL — ABNORMAL HIGH (ref 70–99)
Glucose-Capillary: 146 mg/dL — ABNORMAL HIGH (ref 70–99)
Glucose-Capillary: 163 mg/dL — ABNORMAL HIGH (ref 70–99)
Glucose-Capillary: 251 mg/dL — ABNORMAL HIGH (ref 70–99)
Glucose-Capillary: 303 mg/dL — ABNORMAL HIGH (ref 70–99)

## 2022-04-25 LAB — BASIC METABOLIC PANEL
Anion gap: 8 (ref 5–15)
BUN: 30 mg/dL — ABNORMAL HIGH (ref 8–23)
CO2: 18 mmol/L — ABNORMAL LOW (ref 22–32)
Calcium: 7.6 mg/dL — ABNORMAL LOW (ref 8.9–10.3)
Chloride: 109 mmol/L (ref 98–111)
Creatinine, Ser: 1.86 mg/dL — ABNORMAL HIGH (ref 0.61–1.24)
GFR, Estimated: 38 mL/min — ABNORMAL LOW (ref >60)
Glucose, Bld: 267 mg/dL — ABNORMAL HIGH (ref 70–99)
Potassium: 4.3 mmol/L (ref 3.5–5.1)
Sodium: 135 mmol/L (ref 135–145)

## 2022-04-25 LAB — CBC WITH DIFFERENTIAL/PLATELET
Abs Immature Granulocytes: 0.04 10*3/uL (ref 0.00–0.07)
Basophils Absolute: 0 10*3/uL (ref 0.0–0.1)
Basophils Relative: 0 %
Eosinophils Absolute: 0 10*3/uL (ref 0.0–0.5)
Eosinophils Relative: 0 %
HCT: 25 % — ABNORMAL LOW (ref 39.0–52.0)
Hemoglobin: 8.5 g/dL — ABNORMAL LOW (ref 13.0–17.0)
Immature Granulocytes: 0 %
Lymphocytes Relative: 7 %
Lymphs Abs: 0.7 10*3/uL (ref 0.7–4.0)
MCH: 31.6 pg (ref 26.0–34.0)
MCHC: 34 g/dL (ref 30.0–36.0)
MCV: 92.9 fL (ref 80.0–100.0)
Monocytes Absolute: 1.6 10*3/uL — ABNORMAL HIGH (ref 0.1–1.0)
Monocytes Relative: 16 %
Neutro Abs: 7.6 10*3/uL (ref 1.7–7.7)
Neutrophils Relative %: 77 %
Platelets: 112 10*3/uL — ABNORMAL LOW (ref 150–400)
RBC: 2.69 MIL/uL — ABNORMAL LOW (ref 4.22–5.81)
RDW: 13.2 % (ref 11.5–15.5)
WBC: 9.9 10*3/uL (ref 4.0–10.5)
nRBC: 0 % (ref 0.0–0.2)

## 2022-04-25 NOTE — Progress Notes (Signed)
   Providing Compassionate, Quality Care - Together  NEUROSURGERY PROGRESS NOTE   S: No issues overnight.  Off pressors, complaining of anterior thigh pain, appropriately  O: EXAM:  BP 97/81   Pulse 90   Temp 97.8 F (36.6 C) (Oral)   Resp 17   Ht 5\' 8"  (1.727 m)   Wt 87.3 kg   SpO2 100%   BMI 29.26 kg/m   Awake, alert, oriented x3 PERRL Speech fluent, appropriate  CNs grossly intact  5/5 BUE/BLE  Dressing slightly saturated  ASSESSMENT:  71 y.o. male with   L3-S1 lumbar spondylosis, stenosis  -Status post L3-S1 decompression, instrumentation interbody fusion  PLAN: -Monitor hemoglobin -DC A-line -Transfer to floor -PT/OT evaluation -Overall doing well    Thank you for allowing me to participate in this patient's care.  Please do not hesitate to call with questions or concerns.   66, DO Neurosurgeon [facility] Cell: [phone]

## 2022-04-25 NOTE — Consult Note (Signed)
NAME:  Arthur Smith, MRN:  [id number], DOB:  1951-04-04, LOS: 1 ADMISSION DATE:  04/24/2022, CONSULTATION DATE:  9/8 REFERRING MD:  Dr. Franky Macho, CHIEF COMPLAINT:  s/p lumbar fusion; abla   History of Present Illness:  Patient is a 71 yo Male w/ pertinent PMH of spinal stemosis, lumbar decompression 3-4, 4-5, 5-sacral 1 presents to [facility] for lumbar fusion surgery by NSG.   Patient has been having back pain with walking and standing. MRI showing stensosis at L 3-4, L 4-5, L5-S1. Coming in today on 9/8 for elective lumbar fusion procedure by Dr. Franky Macho.  During procedure patient had drop in BP likely due to blood loss. Patient given blood transfusion. Patient extubated post procedure. Neo started. BP improved with transfusion and fluids. NSG transferring to ICU overnight for closer monitoring. PCCM consulted.  Pertinent  Medical History   Past Medical History:  Diagnosis Date   Arthritis    BPH without obstruction/lower urinary tract symptoms    Bronchitis with asthma, acute 11/2011   CAD S/P percutaneous coronary angioplasty 11/15/2018   Severe 2 vessel obstructive CAD:. Ost-pCx 95% (DES PCI  STENT SYNERGY DES 3.5X28) &p-mCx 100% (DES PCI STENT SYNERGY DES 2.25X32). pRCA 90%, mRCA 85% & rPDA 90%.dLAD ~40%.  EF 55 to 60%.  Moderately elevated LVEDP.  No R WMA.  Plan staged PCI to 3 RCA-PDA lesions 4/1   Depression    denies   Diabetes mellitus without complication (HCC)    Type II- Diet controlled   Frequency of urination    URINATION AT NIGHT, PAINFUL SLOW STREAM    GERD (gastroesophageal reflux disease)    occ   Hyperlipidemia    Neuromuscular disorder (HCC)    DIZZINESS   PERIPHERAL NEUROPATHY    Neuropathy    No pertinent past medical history    RINGING RIGHT EAR   NSTEMI (non-ST elevated myocardial infarction) (HCC) 11/13/2018   Recurrent upper respiratory infection (URI)    SOB CURRENT COLD   Shortness of breath    d/t "cold" 12/02/11     Significant Hospital  Events: Including procedures, antibiotic start and stop dates in addition to other pertinent events   9/8: spinal stenosis s/p posterior lumbar interbody fusion; going to ICU post procedure for ABLA/hemorrhagic shock 9/9 Weaned off Neo overnight  Interim History / Subjective:  Remains weaned of Neo On room air  Objective   Blood pressure 123/81, pulse 93, temperature (!) 96.4 F (35.8 C), temperature source Axillary, resp. rate 11, height 5\' 8"  (1.727 m), weight 87.3 kg, SpO2 100 %.        Intake/Output Summary (Last 24 hours) at 04/25/2022 0655 Last data filed at 04/25/2022 0600 Gross per 24 hour  Intake 8500.79 ml  Output 2965 ml  Net 5535.79 ml    Filed Weights   04/24/22 0634 04/24/22 2123  Weight: 81.6 kg 87.3 kg   Physical Exam: General: Well-appearing, no acute distress HENT: [place], AT Eyes: EOMI, no scleral icterus Respiratory: Clear to auscultation bilaterally.  No crackles, wheezing or rales Cardiovascular: RRR, -M/R/G, no JVD Extremities:-Edema,-tenderness, chronic RLE numbness Neuro: AAO x4, CNII-XII grossly intact Psych: Normal mood, normal affect   Resolved Hospital Problem list     Assessment & Plan:  Spinal stenosis s/p posterior lumbar interbody fusion: Lumbar 3-4, Lumbar 4-5, Lumbar 5-Sacral 1 P: -Post-op care per NSG -IS -pain management -cefazolin surgical ppx -pt/ot per nsg  ABLA Post-op Hypotension  - resolved Hg 10>8.5 P: -Off pressors -S/p transfusion -MAP goal >  65 -CBC daily -SCD dvt ppx -trend cbc and transfuse for hgb < 7 or hemodynamically unstable  Bronchitis w/ asthma P: -prn albuterol for wheezing  CAD HLD P: -continue statin -consider resuming ASA and plavix in am if hgb stable -Ok to restart metoprolol   Pre-diabetes A1c 5.9 -no home meds listed P: -cbg monitoring; will hold on ssi  BPH P: -resume home finasteride and flomax   GERD P: -PPI  Peripheral neuropathy P: -OK to restart home  gabapentin  Depression P: -continue imipramine  Hypotension resolved. Pulmonary will sign off. Please re-consult team for any concerns or questions   Best Practice (right click and "Reselect all SmartList Selections" daily)   Care Time: 50 min  Diet/type: advance diet as tolerated DVT prophylaxis: SCD GI prophylaxis: PPI Lines: N/A Foley:  Yes, and it is still needed Code Status:  full code Last date of multidisciplinary goals of care discussion [per primary]   Mechele Collin, M.D. [facility] Pulmonary/Critical Care Medicine 04/25/2022 6:56 AM   Please see Amion for pager number to reach on-call Pulmonary and Critical Care Team.

## 2022-04-25 NOTE — Evaluation (Signed)
Physical Therapy Evaluation Patient Details Name: Arthur Smith MRN: [id number] DOB: [birth year] Today's Date: 04/25/2022  History of Present Illness  Pt admittted 9/8 for  L3-S1 decompression, instrumentation interbody fusion. Post op hypotension.  PMH:  CAD, diabetes, GERD, history of significant spinal stenosis and prior surgeries.  He has lower extremity claudication and paresthesias, some associated urinary retention.  Clinical Impression  Pt admitted with above diagnosis. Pt limited eval due to blood saturated pad upon sitting.  Also pt c/o incr numbness bil LEs.  Nurse notified and came in to dress it and PT and nurse decided it best to get pt back to bed and nurse notified MD.  Will continue PT as tolerated.   Pt currently with functional limitations due to the deficits listed below (see PT Problem List). Pt will benefit from skilled PT to increase their independence and safety with mobility to allow discharge to the venue listed below.          Recommendations for follow up therapy are one component of a multi-disciplinary discharge planning process, led by the attending physician.  Recommendations may be updated based on patient status, additional functional criteria and insurance authorization.  Follow Up Recommendations Acute inpatient rehab (3hours/day)      Assistance Recommended at Discharge Frequent or constant Supervision/Assistance  Patient can return home with the following  Two people to help with walking and/or transfers;A lot of help with bathing/dressing/bathroom;Assistance with cooking/housework;Assist for transportation;Help with stairs or ramp for entrance    Equipment Recommendations Rolling walker (2 wheels);BSC/3in1  Recommendations for Other Services  Rehab consult    Functional Status Assessment Patient has had a recent decline in their functional status and demonstrates the ability to make significant improvements in function in a reasonable and predictable  amount of time.     Precautions / Restrictions Precautions Precautions: Back;Fall Precaution Booklet Issued: Yes (comment) Required Braces or Orthoses:  (No brace needed per MD) Restrictions Weight Bearing Restrictions: No      Mobility  Bed Mobility Overal bed mobility: Needs Assistance Bed Mobility: Rolling, Sidelying to Sit, Sit to Sidelying Rolling: Mod assist Sidelying to sit: Mod assist     Sit to sidelying: Mod assist General bed mobility comments: Pt needed cues for log roll.  Mod assist for LEs and elevation of trunk. Noted that pad on bed and dressing saturated once pt sat up. Called nurse immediately and she came in to redress the incision.  Assisted pt back to supine due to incr parasthesia with sitting and incision oozing with movement.    Transfers Overall transfer level: Needs assistance Equipment used: Rolling walker (2 wheels) Transfers: Sit to/from Stand Sit to Stand: Total assist           General transfer comment: Pt attempted x 2 and could not stand.LE's too numb.    Ambulation/Gait                  Stairs            Wheelchair Mobility    Modified Rankin (Stroke Patients Only)       Balance Overall balance assessment: Needs assistance Sitting-balance support: Feet supported, Bilateral upper extremity supported Sitting balance-Leahy Scale: Poor Sitting balance - Comments: relies on UE support                                     Pertinent Vitals/Pain Pain Assessment Pain Assessment:  Faces Faces Pain Scale: Hurts a little bit Pain Location: back Pain Descriptors / Indicators: Discomfort, Grimacing, Guarding Pain Intervention(s): Limited activity within patient's tolerance, Monitored during session, Repositioned, Premedicated before session    Home Living Family/patient expects to be discharged to:: Private residence Living Arrangements: Spouse/significant other Available Help at Discharge:  Family;Available 24 hours/day Type of Home: House Home Access: Stairs to enter Entrance Stairs-Rails: Right;Left;Can reach both Entrance Stairs-Number of Steps: 5   Home Layout: One level Home Equipment: Shower seat;Grab bars - tub/shower;Adaptive equipment      Prior Function Prior Level of Function : Independent/Modified Independent;Driving (retired)                     Higher education careers adviser   Dominant Hand: Right    Extremity/Trunk Assessment   Upper Extremity Assessment Upper Extremity Assessment: Defer to OT evaluation    Lower Extremity Assessment Lower Extremity Assessment: RLE deficits/detail;LLE deficits/detail RLE Deficits / Details: grossly 2+/5/ pt reports that this am he couldnt feel below the knees however after movement he cant feel below hips.  Nurse made aware. RLE Sensation: decreased light touch;decreased proprioception LLE Deficits / Details: grossly 2+/5/ pt reports that this am he couldnt feel below the knees however after movement he cant feel below hips.  Nurse made aware. LLE Sensation: decreased light touch;decreased proprioception    Cervical / Trunk Assessment Cervical / Trunk Assessment: Normal  Communication   Communication: No difficulties  Cognition Arousal/Alertness: Awake/alert Behavior During Therapy: WFL for tasks assessed/performed Overall Cognitive Status: Within Functional Limits for tasks assessed                                          General Comments General comments (skin integrity, edema, etc.): VSS with BP soft but pt tolerated sitting.  He did report slight incr dizziness when PT assisted back to bed.    Exercises     Assessment/Plan    PT Assessment Patient needs continued PT services  PT Problem List Decreased activity tolerance;Decreased balance;Decreased mobility;Decreased strength;Decreased range of motion;Decreased coordination;Decreased knowledge of use of DME;Decreased safety  awareness;Decreased knowledge of precautions;Pain       PT Treatment Interventions DME instruction;Gait training;Functional mobility training;Therapeutic activities;Therapeutic exercise;Balance training;Patient/family education;Stair training    PT Goals (Current goals can be found in the Care Plan section)  Acute Rehab PT Goals Patient Stated Goal: to go home PT Goal Formulation: With patient Time For Goal Achievement: 05/09/22 Potential to Achieve Goals: Good    Frequency Min 5X/week     Co-evaluation               AM-PAC PT "6 Clicks" Mobility  Outcome Measure Help needed turning from your back to your side while in a flat bed without using bedrails?: A Lot Help needed moving from lying on your back to sitting on the side of a flat bed without using bedrails?: A Lot Help needed moving to and from a bed to a chair (including a wheelchair)?: Total Help needed standing up from a chair using your arms (e.g., wheelchair or bedside chair)?: Total Help needed to walk in hospital room?: Total Help needed climbing 3-5 steps with a railing? : Total 6 Click Score: 8    End of Session   Activity Tolerance: Patient limited by fatigue (insision oozing blood) Patient left: in bed;with call bell/phone within reach;with bed alarm set;with nursing/sitter  in room Nurse Communication: Mobility status (incision oozing and incr numbness in LEs with movement, nurse notified MD) PT Visit Diagnosis: Unsteadiness on feet (R26.81);Muscle weakness (generalized) (M62.81);Pain Pain - part of body:  (back)    Time: 0277-4128 PT Time Calculation (min) (ACUTE ONLY): 48 min   Charges:   PT Evaluation $PT Eval Moderate Complexity: 1 Mod PT Treatments $Therapeutic Activity: 23-37 mins        [facility] M,PT Acute Rehab Services [phone]   Bevelyn Buckles 04/25/2022, 3:58 PM

## 2022-04-25 NOTE — Progress Notes (Signed)
Inpatient Rehab Admissions Coordinator Note:   Per PT patient was screened for CIR candidacy by Jalyssa Fleisher Luvenia Starch, CCC-SLP. At this time, pt was unable to transfer d/t increased numbness and  bleeding from incision limited therapy evaluation. Pt may have potential to progress to becoming a potential CIR candidate. CIR admissions team will follow to monitor progress and participation with therapies. A consult order will be placed if pt appears to be an appropriate candidate.    Wolfgang Phoenix, MS, CCC-SLP Admissions Coordinator [phone] 04/25/22 5:35 PM

## 2022-04-25 NOTE — Progress Notes (Signed)
Called to patient's room by PT. Patient noted to have saturated through large honeycomb dressing as well as bed pad and bed sheet. While working with PT patient stated he was feeling lightheaded and had increased numbness in both lower extremities while sitting on the side of the bed. Per the patient he had markedly increased numbness in his RLE. Patient helped back into bed and new dressing applied.   Neurosurgery, Dr. Conchita Paris aware. No new orders at this time.

## 2022-04-26 DIAGNOSIS — M545 Low back pain, unspecified: Secondary | ICD-10-CM

## 2022-04-26 LAB — BASIC METABOLIC PANEL
Anion gap: 9 (ref 5–15)
BUN: 41 mg/dL — ABNORMAL HIGH (ref 8–23)
CO2: 21 mmol/L — ABNORMAL LOW (ref 22–32)
Calcium: 8.2 mg/dL — ABNORMAL LOW (ref 8.9–10.3)
Chloride: 107 mmol/L (ref 98–111)
Creatinine, Ser: 3.3 mg/dL — ABNORMAL HIGH (ref 0.61–1.24)
GFR, Estimated: 19 mL/min — ABNORMAL LOW (ref >60)
Glucose, Bld: 172 mg/dL — ABNORMAL HIGH (ref 70–99)
Potassium: 4.9 mmol/L (ref 3.5–5.1)
Sodium: 137 mmol/L (ref 135–145)

## 2022-04-26 LAB — CBC WITH DIFFERENTIAL/PLATELET
Abs Immature Granulocytes: 0.03 10*3/uL (ref 0.00–0.07)
Basophils Absolute: 0 10*3/uL (ref 0.0–0.1)
Basophils Relative: 0 %
Eosinophils Absolute: 0.1 10*3/uL (ref 0.0–0.5)
Eosinophils Relative: 1 %
HCT: 23.4 % — ABNORMAL LOW (ref 39.0–52.0)
Hemoglobin: 7.8 g/dL — ABNORMAL LOW (ref 13.0–17.0)
Immature Granulocytes: 0 %
Lymphocytes Relative: 14 %
Lymphs Abs: 1.4 10*3/uL (ref 0.7–4.0)
MCH: 31.8 pg (ref 26.0–34.0)
MCHC: 33.3 g/dL (ref 30.0–36.0)
MCV: 95.5 fL (ref 80.0–100.0)
Monocytes Absolute: 1.3 10*3/uL — ABNORMAL HIGH (ref 0.1–1.0)
Monocytes Relative: 13 %
Neutro Abs: 7 10*3/uL (ref 1.7–7.7)
Neutrophils Relative %: 72 %
Platelets: 114 10*3/uL — ABNORMAL LOW (ref 150–400)
RBC: 2.45 MIL/uL — ABNORMAL LOW (ref 4.22–5.81)
RDW: 13.7 % (ref 11.5–15.5)
WBC: 9.9 10*3/uL (ref 4.0–10.5)
nRBC: 0 % (ref 0.0–0.2)

## 2022-04-26 LAB — GLUCOSE, CAPILLARY
Glucose-Capillary: 99 mg/dL (ref 70–99)
Glucose-Capillary: 110 mg/dL — ABNORMAL HIGH (ref 70–99)
Glucose-Capillary: 142 mg/dL — ABNORMAL HIGH (ref 70–99)
Glucose-Capillary: 243 mg/dL — ABNORMAL HIGH (ref 70–99)
Glucose-Capillary: 157 mg/dL — ABNORMAL HIGH (ref 70–99)
Glucose-Capillary: 148 mg/dL — ABNORMAL HIGH (ref 70–99)
Glucose-Capillary: 96 mg/dL (ref 70–99)

## 2022-04-26 LAB — HEMOGLOBIN AND HEMATOCRIT, BLOOD
HCT: 27.7 % — ABNORMAL LOW (ref 39.0–52.0)
Hemoglobin: 9.7 g/dL — ABNORMAL LOW (ref 13.0–17.0)

## 2022-04-26 MED ORDER — SODIUM CHLORIDE 0.9% IV SOLUTION: Freq: Once | INTRAVENOUS | Status: AC

## 2022-04-26 MED ORDER — PHENYLEPHRINE HCL-NACL 20-0.9 MG/250ML-% IV SOLN
25.0000 ug/min | INTRAVENOUS | Status: DC
  Filled 2022-04-26: qty 250

## 2022-04-26 MED ORDER — SODIUM CHLORIDE 0.9 % IV SOLN: 250.0000 mL | INTRAVENOUS | Status: DC

## 2022-04-26 MED ORDER — VASOPRESSIN 20 UNITS/100 ML INFUSION FOR SHOCK
INTRAVENOUS | Status: AC
  Filled 2022-04-26: qty 100

## 2022-04-26 MED ORDER — OXYCODONE HCL ER 10 MG PO T12A
10.0000 mg | EXTENDED_RELEASE_TABLET | Freq: Two times a day (BID) | ORAL | Status: DC
  Administered 2022-04-26 – 2022-04-29 (×6): 10 mg via ORAL
  Filled 2022-04-26 (×6): qty 1

## 2022-04-26 NOTE — Progress Notes (Signed)
Patient is receiving blood now, patient's RN is not start Vasopressors at this time. Informed RN that consult IV team if patient needs Vasopressors. HS [employer]

## 2022-04-26 NOTE — Plan of Care (Signed)
Problem: Education: Goal: Knowledge of General Education information will improve Description: Including pain rating scale, medication(s)/side effects and non-pharmacologic comfort measures Outcome: Progressing   Problem: Health Behavior/Discharge Planning: Goal: Ability to manage health-related needs will improve Outcome: Progressing   Problem: Clinical Measurements: Goal: Ability to maintain clinical measurements within normal limits will improve Outcome: Progressing Goal: Will remain free from infection Outcome: Progressing Goal: Diagnostic test results will improve Outcome: Progressing Goal: Respiratory complications will improve Outcome: Progressing Goal: Cardiovascular complication will be avoided Outcome: Progressing   Problem: Activity: Goal: Risk for activity intolerance will decrease Outcome: Progressing   Problem: Nutrition: Goal: Adequate nutrition will be maintained Outcome: Progressing   Problem: Coping: Goal: Level of anxiety will decrease Outcome: Progressing   Problem: Elimination: Goal: Will not experience complications related to bowel motility Outcome: Progressing Goal: Will not experience complications related to urinary retention Outcome: Progressing   Problem: Pain Managment: Goal: General experience of comfort will improve Outcome: Progressing   Problem: Safety: Goal: Ability to remain free from injury will improve Outcome: Progressing   Problem: Skin Integrity: Goal: Risk for impaired skin integrity will decrease Outcome: Progressing   Problem: Education: Goal: Ability to verbalize activity precautions or restrictions will improve Outcome: Progressing Goal: Knowledge of the prescribed therapeutic regimen will improve Outcome: Progressing Goal: Understanding of discharge needs will improve Outcome: Progressing   Problem: Activity: Goal: Ability to avoid complications of mobility impairment will improve Outcome: Progressing Goal:  Ability to tolerate increased activity will improve Outcome: Progressing Goal: Will remain free from falls Outcome: Progressing   Problem: Bowel/Gastric: Goal: Gastrointestinal status for postoperative course will improve Outcome: Progressing   Problem: Clinical Measurements: Goal: Ability to maintain clinical measurements within normal limits will improve Outcome: Progressing Goal: Postoperative complications will be avoided or minimized Outcome: Progressing Goal: Diagnostic test results will improve Outcome: Progressing   Problem: Pain Management: Goal: Pain level will decrease Outcome: Progressing   Problem: Skin Integrity: Goal: Will show signs of wound healing Outcome: Progressing   Problem: Health Behavior/Discharge Planning: Goal: Identification of resources available to assist in meeting health care needs will improve Outcome: Progressing   Problem: Bladder/Genitourinary: Goal: Urinary functional status for postoperative course will improve Outcome: Progressing   Problem: Education: Goal: Ability to verbalize activity precautions or restrictions will improve Outcome: Progressing Goal: Knowledge of the prescribed therapeutic regimen will improve Outcome: Progressing Goal: Understanding of discharge needs will improve Outcome: Progressing   Problem: Education: Goal: Knowledge of General Education information will improve Description: Including pain rating scale, medication(s)/side effects and non-pharmacologic comfort measures Outcome: Progressing   Problem: Health Behavior/Discharge Planning: Goal: Ability to manage health-related needs will improve Outcome: Progressing   Problem: Clinical Measurements: Goal: Ability to maintain clinical measurements within normal limits will improve Outcome: Progressing Goal: Will remain free from infection Outcome: Progressing Goal: Diagnostic test results will improve Outcome: Progressing Goal: Respiratory  complications will improve Outcome: Progressing Goal: Cardiovascular complication will be avoided Outcome: Progressing   Problem: Activity: Goal: Risk for activity intolerance will decrease Outcome: Progressing   Problem: Nutrition: Goal: Adequate nutrition will be maintained Outcome: Progressing   Problem: Coping: Goal: Level of anxiety will decrease Outcome: Progressing   Problem: Elimination: Goal: Will not experience complications related to bowel motility Outcome: Progressing Goal: Will not experience complications related to urinary retention Outcome: Progressing   Problem: Pain Managment: Goal: General experience of comfort will improve Outcome: Progressing   Problem: Safety: Goal: Ability to remain free from injury will improve Outcome: Progressing     Problem: Skin Integrity: Goal: Risk for impaired skin integrity will decrease Outcome: Progressing   

## 2022-04-26 NOTE — Progress Notes (Signed)
  NEUROSURGERY PROGRESS NOTE   No issues overnight. Pt does not report any pain today, does report some numbness in bilateral thighs.   EXAM: Temp:  [97.7 F (36.5 C)-98.9 F (37.2 C)] 98 F (36.7 C) (09/10 0700) Pulse Rate:  [68-85] 72 (09/10 1107) Resp:  [10-23] 15 (09/10 1107) BP: (72-135)/(40-94) 78/48 (09/10 1107) SpO2:  [83 %-100 %] 100 % (09/10 1107) Arterial Line BP: (91-143)/(52-65) 135/65 (09/09 1800) Intake/Output      09/09 0701 09/10 0700 09/10 0701 09/11 0700   P.O. 720    I.V. (mL/kg) 374.7 (4.3) 3 (0)   Blood     IV Piggyback     Total Intake(mL/kg) 1094.7 (12.5) 3 (0)   Urine (mL/kg/hr) 1820 (0.9)    Blood     Total Output 1820    Net -725.3 +3         Awake, alert, oriented  Speech fluent, appropriate  CN grossly intact  5/5 BUE/BLE  Wound dressing with some serosanguinous staining.  LABS: Lab Results  Component Value Date   CREATININE 3.30 (H) 04/26/2022   BUN 41 (H) 04/26/2022   NA 137 04/26/2022   K 4.9 04/26/2022   CL 107 04/26/2022   CO2 21 (L) 04/26/2022   Lab Results  Component Value Date   WBC 9.9 04/26/2022   HGB 7.8 (L) 04/26/2022   HCT 23.4 (L) 04/26/2022   MCV 95.5 04/26/2022   PLT 114 (L) 04/26/2022    IMPRESSION:  71 y.o. male POD#2 s/p L3-S1 decompression/fusion. Neurologically well Hypotensive with blood-loss anemia, hgb dropped to 7.8. Suspect lack of tachycardia from beta-blockade  PLAN: - Will cont to hold anti-hypertensives - Will transfuse 2U PRBC - Can attempt to mobilize after transfusion   Lisbeth Renshaw, MD [facility]

## 2022-04-26 NOTE — Anesthesia Postprocedure Evaluation (Signed)
Anesthesia Post Note  Patient: Arthur Smith  Procedure(s) Performed: Lumbar three-four Lumbar four-five Lumbar five-Sacral one Bilateral Posterior Lumbar Interbody Fusion (Bilateral)     Patient location during evaluation: PACU Anesthesia Type: General Level of consciousness: awake and alert Pain management: pain level controlled Vital Signs Assessment: post-procedure vital signs reviewed and stable Respiratory status: spontaneous breathing, nonlabored ventilation, respiratory function stable and patient connected to nasal cannula oxygen Cardiovascular status: blood pressure returned to baseline and stable Postop Assessment: no apparent nausea or vomiting Anesthetic complications: no   No notable events documented.  Last Vitals:  Vitals:   04/26/22 1453 04/26/22 1500  BP: (!) 89/54 (!) 106/54  Pulse: 73 74  Resp: 13 10  Temp: (!) 36.3 C   SpO2: 98% 100%    Last Pain:  Vitals:   04/26/22 1453  TempSrc: Oral  PainSc:                  [name] [name]

## 2022-04-26 NOTE — Consult Note (Signed)
NAME:  Arthur Smith, MRN:  [id number], DOB:  03-09-1951, LOS: 2 ADMISSION DATE:  04/24/2022, CONSULTATION DATE:  04/26/2022 REFERRING MD:  Dr Conchita Paris, CHIEF COMPLAINT:  Hypotension   History of Present Illness:  Patient is a 71 yo Male w/ pertinent PMH of spinal stemosis, lumbar decompression 3-4, 4-5, 5-sacral 1 presents to [facility] for lumbar fusion surgery by NSG.    Patient has been having back pain with walking and standing. MRI showing stensosis at L 3-4, L 4-5, L5-S1. Coming in today on 9/8 for elective lumbar fusion procedure by Dr. Franky Macho.  During procedure patient had drop in BP likely due to blood loss. Patient given blood transfusion. Patient extubated post procedure. Neo started. BP improved with transfusion and fluids. NSG transferring to ICU overnight for closer monitoring. PCCM consulted.  Increased lethargy, decreased BP, anemia  Pertinent  Medical History   Past Medical History:  Diagnosis Date   Arthritis    BPH without obstruction/lower urinary tract symptoms    Bronchitis with asthma, acute 11/2011   CAD S/P percutaneous coronary angioplasty 11/15/2018   Severe 2 vessel obstructive CAD:. Ost-pCx 95% (DES PCI  STENT SYNERGY DES 3.5X28) &p-mCx 100% (DES PCI STENT SYNERGY DES 2.25X32). pRCA 90%, mRCA 85% & rPDA 90%.dLAD ~40%.  EF 55 to 60%.  Moderately elevated LVEDP.  No R WMA.  Plan staged PCI to 3 RCA-PDA lesions 4/1   Depression    denies   Diabetes mellitus without complication (HCC)    Type II- Diet controlled   Frequency of urination    URINATION AT NIGHT, PAINFUL SLOW STREAM    GERD (gastroesophageal reflux disease)    occ   Hyperlipidemia    Neuromuscular disorder (HCC)    DIZZINESS   PERIPHERAL NEUROPATHY    Neuropathy    No pertinent past medical history    RINGING RIGHT EAR   NSTEMI (non-ST elevated myocardial infarction) (HCC) 11/13/2018   Recurrent upper respiratory infection (URI)    SOB CURRENT COLD   Shortness of breath    d/t "cold" 12/02/11    Significant Hospital Events: Including procedures, antibiotic start and stop dates in addition to other pertinent events   9/8 post posterior lumbar interbody fusion 9/9 weaned off pressors and was doing fine 9/10-AKI  Interim History / Subjective:  Awake, interactive, lethargy  Objective   Blood pressure (!) 85/50, pulse 70, temperature 97.6 F (36.4 C), temperature source Oral, resp. rate 12, height 5\' 8"  (1.727 m), weight 87.3 kg, SpO2 98 %.        Intake/Output Summary (Last 24 hours) at 04/26/2022 1431 Last data filed at 04/26/2022 1420 Gross per 24 hour  Intake 1177.9 ml  Output 1100 ml  Net 77.9 ml   Filed Weights   04/24/22 0634 04/24/22 2123  Weight: 81.6 kg 87.3 kg    Examination: General: Elderly, does not appear to be in distress HENT: Moist oral mucosa Lungs: Clear breath sounds bilaterally Cardiovascular: S1-S2 appreciated Abdomen: Soft, bowel sounds appreciated Extremities: No clubbing, no edema Neuro: Alert and oriented x3, moving all extremities GU: Fair output  Resolved Hospital Problem list     Assessment & Plan:  AKI -Did have some hypotension earlier requiring pressors perioperatively, anemia that required transfusion that may have contributed to the kidney insult -Maintain normotension -Reinitiate pressors -Possibly keep MAP about 70 -Continue to trend renal parameters -Replete electrolytes as needed  Spinal stenosis s/p posterior lumbar interbody fusion L3-4, L4-5, L5-S1  Post acute blood loss anemia -Was  transfused -Transfusion ordered at present  History of bronchitis/asthma -As needed bronchodilators  History of coronary artery disease, hyperlipidemia -On statin -Hold off on metoprolol  BPH -On finasteride and Flomax  GERD -PPI  Peripheral neuropathy -Gabapentin on hold because of kidney dysfunction   Best Practice (right click and "Reselect all SmartList Selections" daily)   Diet/type: Regular consistency (see  orders) DVT prophylaxis: prophylactic heparin  GI prophylaxis: PPI Lines: N/A Foley:  N/A Code Status:  full code Last date of multidisciplinary goals of care discussion [Per primary]  Labs   CBC: Recent Labs  Lab 04/24/22 1757 04/24/22 1927 04/24/22 2158 04/25/22 0435 04/26/22 0834  WBC  --   --  13.3* 9.9 9.9  NEUTROABS  --   --   --  7.6 7.0  HGB 10.5* 10.2* 10.1* 8.5* 7.8*  HCT 31.0* 30.0* 29.7* 25.0* 23.4*  MCV  --   --  93.4 92.9 95.5  PLT  --   --  126* 112* 114*    Basic Metabolic Panel: Recent Labs  Lab 04/24/22 1757 04/24/22 1927 04/24/22 2158 04/25/22 0435 04/26/22 0834  NA 139 139 139 135 137  K 3.8 4.1 4.1 4.3 4.9  CL  --   --  108 109 107  CO2  --   --  19* 18* 21*  GLUCOSE  --   --  187* 267* 172*  BUN  --   --  26* 30* 41*  CREATININE  --   --  1.30* 1.86* 3.30*  CALCIUM  --   --  8.0* 7.6* 8.2*  MG  --   --  1.6*  --   --   PHOS  --   --  4.9*  --   --    GFR: Estimated Creatinine Clearance: 22.4 mL/min (A) (by C-G formula based on SCr of 3.3 mg/dL (H)). Recent Labs  Lab 04/24/22 2158 04/25/22 0435 04/26/22 0834  WBC 13.3* 9.9 9.9    Liver Function Tests: No results for input(s): "AST", "ALT", "ALKPHOS", "BILITOT", "PROT", "ALBUMIN" in the last 168 hours. No results for input(s): "LIPASE", "AMYLASE" in the last 168 hours. No results for input(s): "AMMONIA" in the last 168 hours.  ABG    Component Value Date/Time   PHART 7.308 (L) 04/24/2022 1927   PCO2ART 43.1 04/24/2022 1927   PO2ART 82 (L) 04/24/2022 1927   HCO3 21.7 04/24/2022 1927   TCO2 23 04/24/2022 1927   ACIDBASEDEF 5.0 (H) 04/24/2022 1927   O2SAT 95 04/24/2022 1927     Coagulation Profile: No results for input(s): "INR", "PROTIME" in the last 168 hours.  Cardiac Enzymes: No results for input(s): "CKTOTAL", "CKMB", "CKMBINDEX", "TROPONINI" in the last 168 hours.  HbA1C: Hgb A1c MFr Bld  Date/Time Value Ref Range Status  04/17/2022 03:14 PM 5.9 (H) 4.8 - 5.6 %  Final    Comment:    (NOTE) Pre diabetes:          5.7%-6.4%  Diabetes:              >6.4%  Glycemic control for   <7.0% adults with diabetes   12/02/2019 08:35 AM 9.6 (H) 4.8 - 5.6 % Final    Comment:    (NOTE) Pre diabetes:          5.7%-6.4% Diabetes:              >6.4% Glycemic control for   <7.0% adults with diabetes     CBG: Recent Labs  Lab 04/25/22 1931  04/25/22 2350 04/26/22 0354 04/26/22 0735 04/26/22 1127  GLUCAP 146* 121* 99 110* 142*    Review of Systems:   Does not have any pain or discomfort, arousable  Past Medical History:  He,  has a past medical history of Arthritis, BPH without obstruction/lower urinary tract symptoms, Bronchitis with asthma, acute (11/2011), CAD S/P percutaneous coronary angioplasty (11/15/2018), Depression, Diabetes mellitus without complication (HCC), Frequency of urination, GERD (gastroesophageal reflux disease), Hyperlipidemia, Neuromuscular disorder (HCC), Neuropathy, No pertinent past medical history, NSTEMI (non-ST elevated myocardial infarction) (HCC) (11/13/2018), Recurrent upper respiratory infection (URI), and Shortness of breath.   Surgical History:   Past Surgical History:  Procedure Laterality Date   BACK SURGERY     CARDIAC CATHETERIZATION     CORONARY STENT INTERVENTION N/A 11/15/2018   Procedure: CORONARY STENT INTERVENTION;  Surgeon: Swaziland, Peter M, MD;  Location: [facility] INVASIVE CV LAB;;  Ost-pCx 95% (DES PCI  STENT SYNERGY DES 3.5X28) &p-mCx 100% (DES PCI STENT SYNERGY DES 2.25X32)   CORONARY STENT INTERVENTION N/A 11/16/2018   Procedure: CORONARY STENT INTERVENTION;  Surgeon: Lyn Records, MD;  Location: [facility] INVASIVE CV LAB;  Service: Cardiovascular;  Laterality: N/A;   FINGER SURGERY     RIGHT MIDDLE FINGER   HERNIA REPAIR     UMBILICAL HERNIA 2011   LEFT HEART CATH AND CORONARY ANGIOGRAPHY N/A 11/15/2018   Procedure: LEFT HEART CATH AND CORONARY ANGIOGRAPHY;  Surgeon: Swaziland, Peter M, MD;  Location: [facility]  INVASIVE CV LAB;; Severe 2 vessel obstructive CAD:. Ost-pCx 95% (DES PCI) &p-mCx 100% (DES PCI). pRCA 90%, mRCA 85% & rPDA 90%.dLAD ~40%.  EF 55 to 60%.  Moderately elevated LVEDP.  No R WMA.  -- Plan staged PCI to 3 RCA-PDA lesions 4/1   LUMBAR LAMINECTOMY/DECOMPRESSION MICRODISCECTOMY  12/22/2011   Procedure: LUMBAR LAMINECTOMY/DECOMPRESSION MICRODISCECTOMY 1 LEVEL;  Surgeon: Maeola Harman, MD;  Location: [facility] NEURO ORS;  Service: Neurosurgery;  Laterality: N/A;  Thoracic ten-eleven laminectomy   LUMBAR LAMINECTOMY/DECOMPRESSION MICRODISCECTOMY Right 05/01/2014   Procedure: Right Lumbar three-four Microdiskectomy;  Surgeon: Maeola Harman, MD;  Location: [facility] NEURO ORS;  Service: Neurosurgery;  Laterality: Right;   LUMBAR LAMINECTOMY/DECOMPRESSION MICRODISCECTOMY Right 11/08/2014   Procedure: Right L4-5 L5-S1 Laminectomy;  Surgeon: Maeola Harman, MD;  Location: [facility] NEURO ORS;  Service: Neurosurgery;  Laterality: Right;  Right L4-5 L5-S1 Laminectomy   MULTIPLE TOOTH EXTRACTIONS       Social History:   reports that he quit smoking about 38 years ago. His smoking use included cigarettes. He has a 30.00 pack-year smoking history. He has never used smokeless tobacco. He reports current alcohol use. He reports that he does not use drugs.   Family History:  His family history includes Cancer in his mother; Neuropathy in his brother; Stroke in his father.   Allergies Allergies  Allergen Reactions   Eggs Or Egg-Derived Products Diarrhea    Egg yolks cause stomach cramps and diarrhea   Lactose Intolerance (Gi) Diarrhea    Causes stomach cramps and diarrhea    The patient is critically ill with multiple organ systems failure and requires high complexity decision making for assessment and support, frequent evaluation and titration of therapies, application of advanced monitoring technologies and extensive interpretation of multiple databases. Critical Care Time devoted to patient care services described in this  note independent of APP/resident time (if applicable)  is 30 minutes.   Virl Diamond MD [facility] Personal pager: See Amion If unanswered, please page CCM On-call: #[phone]

## 2022-04-26 NOTE — Progress Notes (Signed)
OT Cancellation Note  Patient Details Name: Arthur Smith MRN: [id number] DOB: 30-Mar-1951   Cancelled Treatment:    Reason Eval/Treat Not Completed: Medical issues which prohibited therapy. Pt with Hgb of 7.8 with ordered 2 units for transfusion and per MD note "mobilize after transfusion".  Ignacia Palma, OTR/L [facility] [phone] Office [phone]    Evette Georges 04/26/2022, 12:22 PM

## 2022-04-26 NOTE — Progress Notes (Signed)
Patients serum creatinine noted to be increased from baseline 1.3 to 3.3 this morning. I have spoken with the pharmacy and we will plan on discontinuing nephro toxic and renally cleared home medications including celebrex, his tricyclic, and zonissimide. We will also plan on decreasing his gabapentin which is primarily renally cleared. We will continue to monitor his serum creatinine.  I have also spoken with the patient's bed side nurse who reports patient has become somewhat more lethargic. We will plan on proceeding with the transfusion. I Will give him a 500cc fluid bolus and start him on maintenance normal saline. I will speak with our critical care colleagues for assistance as a patient appears to be exhibiting signs of hypovolemic shock.  Lisbeth Renshaw, MD [facility]

## 2022-04-27 ENCOUNTER — Inpatient Hospital Stay (HOSPITAL_COMMUNITY): Payer: Medicare Other

## 2022-04-27 DIAGNOSIS — M545 Low back pain, unspecified: Secondary | ICD-10-CM | POA: Diagnosis not present

## 2022-04-27 DIAGNOSIS — L8990 Pressure ulcer of unspecified site, unspecified stage: Secondary | ICD-10-CM | POA: Insufficient documentation

## 2022-04-27 LAB — CBC WITH DIFFERENTIAL/PLATELET
Abs Immature Granulocytes: 0.05 10*3/uL (ref 0.00–0.07)
Basophils Absolute: 0 10*3/uL (ref 0.0–0.1)
Basophils Relative: 0 %
Eosinophils Absolute: 0.3 10*3/uL (ref 0.0–0.5)
Eosinophils Relative: 3 %
HCT: 26.9 % — ABNORMAL LOW (ref 39.0–52.0)
Hemoglobin: 9.4 g/dL — ABNORMAL LOW (ref 13.0–17.0)
Immature Granulocytes: 1 %
Lymphocytes Relative: 12 %
Lymphs Abs: 1.3 10*3/uL (ref 0.7–4.0)
MCH: 31.4 pg (ref 26.0–34.0)
MCHC: 34.9 g/dL (ref 30.0–36.0)
MCV: 90 fL (ref 80.0–100.0)
Monocytes Absolute: 1.3 10*3/uL — ABNORMAL HIGH (ref 0.1–1.0)
Monocytes Relative: 13 %
Neutro Abs: 7.5 10*3/uL (ref 1.7–7.7)
Neutrophils Relative %: 71 %
Platelets: 120 10*3/uL — ABNORMAL LOW (ref 150–400)
RBC: 2.99 MIL/uL — ABNORMAL LOW (ref 4.22–5.81)
RDW: 15.5 % (ref 11.5–15.5)
WBC: 10.4 10*3/uL (ref 4.0–10.5)
nRBC: 0 % (ref 0.0–0.2)

## 2022-04-27 LAB — GLUCOSE, CAPILLARY
Glucose-Capillary: 110 mg/dL — ABNORMAL HIGH (ref 70–99)
Glucose-Capillary: 123 mg/dL — ABNORMAL HIGH (ref 70–99)
Glucose-Capillary: 132 mg/dL — ABNORMAL HIGH (ref 70–99)
Glucose-Capillary: 165 mg/dL — ABNORMAL HIGH (ref 70–99)
Glucose-Capillary: 170 mg/dL — ABNORMAL HIGH (ref 70–99)
Glucose-Capillary: 97 mg/dL (ref 70–99)

## 2022-04-27 LAB — BPAM RBC
Blood Product Expiration Date: 202309152359
Blood Product Expiration Date: 202309182359
ISSUE DATE / TIME: 202309101222
ISSUE DATE / TIME: 202309101411
Unit Type and Rh: 9500
Unit Type and Rh: 9500

## 2022-04-27 LAB — BASIC METABOLIC PANEL
Anion gap: 8 (ref 5–15)
BUN: 47 mg/dL — ABNORMAL HIGH (ref 8–23)
CO2: 18 mmol/L — ABNORMAL LOW (ref 22–32)
Calcium: 8.1 mg/dL — ABNORMAL LOW (ref 8.9–10.3)
Chloride: 111 mmol/L (ref 98–111)
Creatinine, Ser: 4.38 mg/dL — ABNORMAL HIGH (ref 0.61–1.24)
GFR, Estimated: 14 mL/min — ABNORMAL LOW (ref >60)
Glucose, Bld: 167 mg/dL — ABNORMAL HIGH (ref 70–99)
Potassium: 5 mmol/L (ref 3.5–5.1)
Sodium: 137 mmol/L (ref 135–145)

## 2022-04-27 LAB — TYPE AND SCREEN
ABO/RH(D): O NEG
Antibody Screen: NEGATIVE
Unit division: 0

## 2022-04-27 MED ORDER — LACTATED RINGERS IV SOLN: INTRAVENOUS | Status: AC

## 2022-04-27 MED FILL — Thrombin For Soln 20000 Unit: CUTANEOUS | Qty: 1 | Status: AC

## 2022-04-27 NOTE — Consult Note (Signed)
[facility]  INPATIENT CONSULTATION  Reason for Consultation: AKI Requesting Provider: Dr. Christella Noa  HPI: Arthur Smith is an 71 y.o. male with BPH f/b Dr. Lovena Neighbours, HL, GERD, CAD, diet controlled DM, lumbar spinal stenosis s/p lumbar fusion 9/8 who is seen for AKI.   Pt underwent elective lumbar fusion 9/8.  Had hypotension, blood loss anemia intraoperatively.  Required blood transfusion (hb from 14s to 7s during this admission) and vasopressor support including post op.  He was transferred to ICU.    Creatinine trended from 1.1 on 9/1 to 1.3 > 1.9 > 3.3 and 4.4 today.  Electrolytes, bicarb ok.  UOP has been good 2.8L yesterday, 2.2L already today.    He is seen this PM in ICU pending transfer.  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  He's feeling generally ok now.  He has had liquid intake which is fair.  Finishing up a 12h run of LR at 75/hr. +flatus, no BM yet.  No edema, no orthopnea.  Has BPH, using condom cath currently, denies increase difficulty voiding.  H/o urinary retention on OAB meds previously.  No preceeding NSAID use, no contrast exposure.  PMH: Past Medical History:  Diagnosis Date   Arthritis    BPH without obstruction/lower urinary tract symptoms    Bronchitis with asthma, acute 11/2011   CAD S/P percutaneous coronary angioplasty 11/15/2018   Severe 2 vessel obstructive CAD:. Ost-pCx 95% (DES PCI  STENT SYNERGY DES 3.5X28) &p-mCx 100% (DES PCI STENT SYNERGY DES 2.25X32). pRCA 90%, mRCA 85% & rPDA 90%.dLAD ~40%.  EF 55 to 60%.  Moderately elevated LVEDP.  No R WMA.  Plan staged PCI to 3 RCA-PDA lesions 4/1   Depression    denies   Diabetes mellitus without complication (HCC)    Type II- Diet controlled   Frequency of urination    URINATION AT NIGHT, PAINFUL SLOW STREAM    GERD (gastroesophageal reflux disease)    occ   Hyperlipidemia    Neuromuscular disorder (HCC)    DIZZINESS   PERIPHERAL NEUROPATHY    Neuropathy    No pertinent past medical history     RINGING RIGHT EAR   NSTEMI (non-ST elevated myocardial infarction) ([place]) 11/13/2018   Recurrent upper respiratory infection (URI)    SOB CURRENT COLD   Shortness of breath    d/t "cold" 12/02/11   PSH: Past Surgical History:  Procedure Laterality Date   BACK SURGERY     CARDIAC CATHETERIZATION     CORONARY STENT INTERVENTION N/A 11/15/2018   Procedure: CORONARY STENT INTERVENTION;  Surgeon: Martinique, Peter M, MD;  Location: [facility];;  Ost-pCx 95% (DES PCI  STENT SYNERGY DES 3.5X28) &p-mCx 100% (DES PCI STENT SYNERGY DES 2.25X32)   CORONARY STENT INTERVENTION N/A 11/16/2018   Procedure: CORONARY STENT INTERVENTION;  Surgeon: Belva Crome, MD;  Location: [place] CV LAB;  Service: Cardiovascular;  Laterality: N/A;   FINGER SURGERY     RIGHT MIDDLE FINGER   HERNIA REPAIR     UMBILICAL HERNIA [id number]   LEFT HEART CATH AND CORONARY ANGIOGRAPHY N/A 11/15/2018   Procedure: LEFT HEART CATH AND CORONARY ANGIOGRAPHY;  Surgeon: Martinique, Peter M, MD;  Location: [facility];; Severe 2 vessel obstructive CAD:. Ost-pCx 95% (DES PCI) &p-mCx 100% (DES PCI). pRCA 90%, mRCA 85% & rPDA 90%.dLAD ~40%.  EF 55 to 60%.  Moderately elevated LVEDP.  No R WMA.  -- Plan staged PCI to 3 RCA-PDA lesions 4/1  LUMBAR LAMINECTOMY/DECOMPRESSION MICRODISCECTOMY  12/22/2011   Procedure: LUMBAR LAMINECTOMY/DECOMPRESSION MICRODISCECTOMY 1 LEVEL;  Surgeon: Erline Levine, MD;  Location: [place] [facility];  Service: Neurosurgery;  Laterality: N/A;  Thoracic ten-eleven laminectomy   LUMBAR LAMINECTOMY/DECOMPRESSION MICRODISCECTOMY Right 05/01/2014   Procedure: Right Lumbar three-four Microdiskectomy;  Surgeon: Erline Levine, MD;  Location: [place] [facility] OR[facility];  Service: Neurosurgery;  Laterality: Right;   LUMBAR LAMINECTOMY/DECOMPRESSION MICRODISCECTOMY Right 11/08/2014   Procedure: Right L4-5 L5-S1 Laminectomy;  Surgeon: Erline Levine, MD;  Location: [place] NEURO ORS;  Service: Neurosurgery;  Laterality: Right;  Right L4-5 L5-S1  Laminectomy   MULTIPLE TOOTH EXTRACTIONS      Past Medical History:  Diagnosis Date   Arthritis    BPH without obstruction/lower urinary tract symptoms    Bronchitis with asthma, acute 11/2011   CAD S/P percutaneous coronary angioplasty 11/15/2018   Severe 2 vessel obstructive CAD:. Ost-pCx 95% (DES PCI  STENT SYNERGY DES 3.5X28) &p-mCx 100% (DES PCI STENT SYNERGY DES 2.25X32). pRCA 90%, mRCA 85% & rPDA 90%.dLAD ~40%.  EF 55 to 60%.  Moderately elevated LVEDP.  No R WMA.  Plan staged PCI to 3 RCA-PDA lesions 4/1   Depression    denies   Diabetes mellitus without complication (HCC)    Type II- Diet controlled   Frequency of urination    URINATION AT NIGHT, PAINFUL SLOW STREAM    GERD (gastroesophageal reflux disease)    occ   Hyperlipidemia    Neuromuscular disorder (HCC)    DIZZINESS   PERIPHERAL NEUROPATHY    Neuropathy    No pertinent past medical history    RINGING RIGHT EAR   NSTEMI (non-ST elevated myocardial infarction) ([place]) 11/13/2018   Recurrent upper respiratory infection (URI)    SOB CURRENT COLD   Shortness of breath    d/t "cold" 12/02/11    Medications:  I have reviewed the patient's current medications.  Medications Prior to Admission  Medication Sig Dispense Refill   Alpha-D-Galactosidase (BEANO) TABS Take 1 tablet by mouth daily as needed (flatulence).     aspirin EC 81 MG tablet Take 1 tablet (81 mg total) by mouth See admin instructions.     atorvastatin (LIPITOR) 80 MG tablet Take 1 tablet (80 mg total) by mouth at bedtime. 90 tablet 3   B Complex Vitamins (VITAMIN B COMPLEX) TABS Take 1 tablet by mouth daily.     cholecalciferol (VITAMIN D3) 25 MCG (1000 UT) tablet Take 1,000 Units by mouth daily.     clopidogrel (PLAVIX) 75 MG tablet Take 1 tablet (75 mg total) by mouth daily. 90 tablet 3   Cyanocobalamin 2500 MCG TABS Take 2,500 mcg by mouth daily.     ferrous sulfate 325 (65 FE) MG EC tablet Take 325 mg by mouth daily.     finasteride (PROSCAR) 5 MG  tablet Take 5 mg by mouth daily.     fluticasone (FLONASE) 50 MCG/ACT nasal spray Place 2 sprays into both nostrils daily as needed for allergies.     gabapentin (NEURONTIN) 800 MG tablet Take 1 tablet (800 mg total) by mouth 4 (four) times daily. 034 tablet 1   GARLIC PO Take 1 capsule by mouth daily.     imipramine (TOFRANIL-PM) 150 MG capsule TAKE 1 CAPSULE BY MOUTH AT BEDTIME. 90 capsule 3   metoprolol succinate (TOPROL-XL) 25 MG 24 hr tablet TAKE 1 TABLET (25 MG TOTAL) BY MOUTH DAILY. 90 tablet 3   Omega-3 Fatty Acids (FISH OIL) 1000 MG CAPS Take 1,000 mg by mouth  daily.     pantoprazole (PROTONIX) 40 MG tablet Take 40 mg by mouth daily.     senna-docusate (SENOKOT-S) 8.6-50 MG tablet Take 2 tablets by mouth at bedtime as needed (constipation).      tamsulosin (FLOMAX) 0.4 MG CAPS capsule Take 0.4 mg by mouth every 12 (twelve) hours.     zonisamide (ZONEGRAN) 100 MG capsule TAKE 3 AT NIGHT (Patient taking differently: Take 300 mg by mouth daily at 6 PM.) 270 capsule 0   albuterol (VENTOLIN HFA) 108 (90 Base) MCG/ACT inhaler Inhale 2 puffs into the lungs every 6 (six) hours as needed for wheezing or shortness of breath.     blood glucose meter kit and supplies Dispense based on patient and insurance preference. Use up to four times daily as directed. (FOR ICD-10 E10.9, E11.9). 1 each 0   nitroGLYCERIN (NITROSTAT) 0.4 MG SL tablet Place 1 tablet (0.4 mg total) under the tongue every 5 (five) minutes as needed for chest pain. DON'T take within 24 hrs of Viagra 25 tablet 6    ALLERGIES:   Allergies  Allergen Reactions   Eggs Or Egg-Derived Products Diarrhea    Egg yolks cause stomach cramps and diarrhea   Lactose Intolerance (Gi) Diarrhea    Causes stomach cramps and diarrhea    FAM HX: Family History  Problem Relation Age of Onset   Cancer Mother    Stroke Father    Neuropathy Brother     Social History:   reports that he quit smoking about 38 years ago. His smoking use included  cigarettes. He has a 30.00 pack-year smoking history. He has never used smokeless tobacco. He reports current alcohol use. He reports that he does not use drugs.  ROS: 12 system ROS neg except per HPI above  Blood pressure (!) 146/86, pulse (!) 101, temperature 97.9 F (36.6 C), temperature source Oral, resp. rate 14, height '5\' 8"'  (1.727 m), weight 87.3 kg, SpO2 100 %. PHYSICAL EXAM: Gen: sitting in chair appearing well  Eyes: anicteric, EOMI ENT: MMM Neck: supple CV:  borderline tachy, RRR, no rub Abd: soft, mildly distended + BS Back: clear on deep inspiration GU: condom cath, bladder not palpable Extr:  no edema Neuro: grossly nonfocal, lumbar incision c/d/i Skin: no rashes   Results for orders placed or performed during the hospital encounter of 04/24/22 (from the past 48 hour(s))  Glucose, capillary     Status: Abnormal   Collection Time: 04/25/22 11:50 PM  Result Value Ref Range   Glucose-Capillary 121 (H) 70 - 99 mg/dL    Comment: Glucose reference range applies only to samples taken after fasting for at least 8 hours.  Glucose, capillary     Status: None   Collection Time: 04/26/22  3:54 AM  Result Value Ref Range   Glucose-Capillary 99 70 - 99 mg/dL    Comment: Glucose reference range applies only to samples taken after fasting for at least 8 hours.  Glucose, capillary     Status: Abnormal   Collection Time: 04/26/22  7:35 AM  Result Value Ref Range   Glucose-Capillary 110 (H) 70 - 99 mg/dL    Comment: Glucose reference range applies only to samples taken after fasting for at least 8 hours.  Basic metabolic panel     Status: Abnormal   Collection Time: 04/26/22  8:34 AM  Result Value Ref Range   Sodium 137 135 - 145 mmol/L   Potassium 4.9 3.5 - 5.1 mmol/L   Chloride 107 98 -  111 mmol/L   CO2 21 (L) 22 - 32 mmol/L   Glucose, Bld 172 (H) 70 - 99 mg/dL    Comment: Glucose reference range applies only to samples taken after fasting for at least 8 hours.   BUN 41 (H) 8  - 23 mg/dL   Creatinine, Ser 3.30 (H) 0.61 - 1.24 mg/dL    Comment: DELTA CHECK NOTED   Calcium 8.2 (L) 8.9 - 10.3 mg/dL   GFR, Estimated 19 (L) >60 mL/min    Comment: (NOTE) Calculated using the CKD-EPI Creatinine Equation (2021)    Anion gap 9 5 - 15    Comment: Performed at [facility] [address], [place], [place] [address]  CBC with Differential/Platelet     Status: Abnormal   Collection Time: 04/26/22  8:34 AM  Result Value Ref Range   WBC 9.9 4.0 - 10.5 K/uL   RBC 2.45 (L) 4.22 - 5.81 MIL/uL   Hemoglobin 7.8 (L) 13.0 - 17.0 g/dL   HCT 23.4 (L) 39.0 - 52.0 %   MCV 95.5 80.0 - 100.0 fL   MCH 31.8 26.0 - 34.0 pg   MCHC 33.3 30.0 - 36.0 g/dL   RDW 13.7 11.5 - 15.5 %   Platelets 114 (L) 150 - 400 K/uL    Comment: REPEATED TO VERIFY   nRBC 0.0 0.0 - 0.2 %   Neutrophils Relative % 72 %   Neutro Abs 7.0 1.7 - 7.7 K/uL   Lymphocytes Relative 14 %   Lymphs Abs 1.4 0.7 - 4.0 K/uL   Monocytes Relative 13 %   Monocytes Absolute 1.3 (H) 0.1 - 1.0 K/uL   Eosinophils Relative 1 %   Eosinophils Absolute 0.1 0.0 - 0.5 K/uL   Basophils Relative 0 %   Basophils Absolute 0.0 0.0 - 0.1 K/uL   Immature Granulocytes 0 %   Abs Immature Granulocytes 0.03 0.00 - 0.07 K/uL    Comment: Performed at [facility], [place] [address], [place], [place] [address]  Glucose, capillary     Status: Abnormal   Collection Time: 04/26/22 11:27 AM  Result Value Ref Range   Glucose-Capillary 142 (H) 70 - 99 mg/dL    Comment: Glucose reference range applies only to samples taken after fasting for at least 8 hours.  Glucose, capillary     Status: Abnormal   Collection Time: 04/26/22  3:32 PM  Result Value Ref Range   Glucose-Capillary 243 (H) 70 - 99 mg/dL    Comment: Glucose reference range applies only to samples taken after fasting for at least 8 hours.  Glucose, capillary     Status: Abnormal   Collection Time: 04/26/22  5:56 PM  Result Value Ref Range   Glucose-Capillary 157 (H) 70 - 99  mg/dL    Comment: Glucose reference range applies only to samples taken after fasting for at least 8 hours.  Hemoglobin and hematocrit, blood     Status: Abnormal   Collection Time: 04/26/22  6:49 PM  Result Value Ref Range   Hemoglobin 9.7 (L) 13.0 - 17.0 g/dL    Comment: REPEATED TO VERIFY   HCT 27.7 (L) 39.0 - 52.0 %    Comment: Performed at [facility] [address] [place], [place] [address]  Glucose, capillary     Status: Abnormal   Collection Time: 04/26/22  7:18 PM  Result Value Ref Range   Glucose-Capillary 148 (H) 70 - 99 mg/dL    Comment: Glucose reference range  applies only to samples taken after fasting for at least 8 hours.  Glucose, capillary     Status: None   Collection Time: 04/26/22 11:20 PM  Result Value Ref Range   Glucose-Capillary 96 70 - 99 mg/dL    Comment: Glucose reference range applies only to samples taken after fasting for at least 8 hours.  Glucose, capillary     Status: Abnormal   Collection Time: 04/27/22  3:12 AM  Result Value Ref Range   Glucose-Capillary 110 (H) 70 - 99 mg/dL    Comment: Glucose reference range applies only to samples taken after fasting for at least 8 hours.  Basic metabolic panel     Status: Abnormal   Collection Time: 04/27/22  4:20 AM  Result Value Ref Range   Sodium 137 135 - 145 mmol/L   Potassium 5.0 3.5 - 5.1 mmol/L   Chloride 111 98 - 111 mmol/L   CO2 18 (L) 22 - 32 mmol/L   Glucose, Bld 167 (H) 70 - 99 mg/dL    Comment: Glucose reference range applies only to samples taken after fasting for at least 8 hours.   BUN 47 (H) 8 - 23 mg/dL   Creatinine, Ser 4.38 (H) 0.61 - 1.24 mg/dL   Calcium 8.1 (L) 8.9 - 10.3 mg/dL   GFR, Estimated 14 (L) >60 mL/min    Comment: (NOTE) Calculated using the CKD-EPI Creatinine Equation (2021)    Anion gap 8 5 - 15    Comment: Performed at [facility] [address], [place], [place] [address]  CBC with Differential/Platelet     Status: Abnormal   Collection Time:  04/27/22  4:20 AM  Result Value Ref Range   WBC 10.4 4.0 - 10.5 K/uL   RBC 2.99 (L) 4.22 - 5.81 MIL/uL   Hemoglobin 9.4 (L) 13.0 - 17.0 g/dL   HCT 26.9 (L) 39.0 - 52.0 %   MCV 90.0 80.0 - 100.0 fL   MCH 31.4 26.0 - 34.0 pg   MCHC 34.9 30.0 - 36.0 g/dL   RDW 15.5 11.5 - 15.5 %   Platelets 120 (L) 150 - 400 K/uL   nRBC 0.0 0.0 - 0.2 %   Neutrophils Relative % 71 %   Neutro Abs 7.5 1.7 - 7.7 K/uL   Lymphocytes Relative 12 %   Lymphs Abs 1.3 0.7 - 4.0 K/uL   Monocytes Relative 13 %   Monocytes Absolute 1.3 (H) 0.1 - 1.0 K/uL   Eosinophils Relative 3 %   Eosinophils Absolute 0.3 0.0 - 0.5 K/uL   Basophils Relative 0 %   Basophils Absolute 0.0 0.0 - 0.1 K/uL   Immature Granulocytes 1 %   Abs Immature Granulocytes 0.05 0.00 - 0.07 K/uL    Comment: Performed at [facility], [place] [address] [place], [place] [address]  Glucose, capillary     Status: Abnormal   Collection Time: 04/27/22  8:55 AM  Result Value Ref Range   Glucose-Capillary 170 (H) 70 - 99 mg/dL    Comment: Glucose reference range applies only to samples taken after fasting for at least 8 hours.   Comment 1 Notify RN    Comment 2 Document in Chart   Glucose, capillary     Status: Abnormal   Collection Time: 04/27/22 11:41 AM  Result Value Ref Range   Glucose-Capillary 132 (H) 70 - 99 mg/dL    Comment: Glucose reference range applies only to samples taken after fasting for at least 8  hours.   Comment 1 Notify RN    Comment 2 Document in Chart   Glucose, capillary     Status: None   Collection Time: 04/27/22  3:47 PM  Result Value Ref Range   Glucose-Capillary 97 70 - 99 mg/dL    Comment: Glucose reference range applies only to samples taken after fasting for at least 8 hours.   Comment 1 Notify RN    Comment 2 Document in Chart   Glucose, capillary     Status: Abnormal   Collection Time: 04/27/22  7:10 PM  Result Value Ref Range   Glucose-Capillary 123 (H) 70 - 99 mg/dL    Comment: Glucose reference range  applies only to samples taken after fasting for at least 8 hours.    No results found.  Assessment/Plan **AKI, severe, nonoliguric:  Pt with normal baseline renal function now with severe but nonoliguric AKI s/p intraoperative blood loss and hypotension.  Suspect ATN.  Non oliguric status portends better prognosis.  Check UA, renal US, PVR for completeness, particularly with h/o retention.  No indications for RRT currently, doubt will develop but will follow closely.  Completing 12h of LR - po intake of fluids seems fine, won't reorder fluids at this time but low threshold to do so. Avoid hypovolemia, hypotension, nephrotoxins.  Avoid use of morphine, baclofen.  Dose meds for crcl.    **ABLA: transfuse per primary for Hb < 7.   **Hypotension:  presumed hypovolemia/blood loss.  Off pressors now.  Home metoprolol on hold.   **BPH: per above r/o obstruction; on home finasteride and tamsulosin.   **GERD: on PPI, ok to continue  **Spinal stenosis s/p fusion:  plans for CIR once acute issues resolved  Will follow, contact with concerns.   Justin Mend 04/27/2022, 9:07 PM

## 2022-04-27 NOTE — Progress Notes (Signed)
? ?  Inpatient Rehab Admissions Coordinator : ? ?Per therapy recommendations, patient was screened for CIR candidacy by Alaze Garverick RN MSN.  At this time patient appears to be a potential candidate for CIR. I will place a rehab consult per protocol for full assessment. Please call me with any questions. ? ?Lesly Pontarelli RN MSN ?Admissions Coordinator ?[phone] ?  ?

## 2022-04-27 NOTE — Progress Notes (Signed)
Inpatient Rehab Coordinator Note:  I met with patient at bedside, spoke to wife by phone, to discuss CIR recommendations and goals/expectations of CIR stay.  We reviewed 3 hrs/day of therapy, physician follow up, and average length of stay 2 weeks (dependent upon progress) with goals of mod I. Wife and patient are in agreement and wife is able to provide 24/7 supervision. I will continue to follow patient and will plan to admit patient to CIR when he is medically ready and we have bed available.   Rehab Admissons Coordinator [place], [place], [place] [phone]

## 2022-04-27 NOTE — Progress Notes (Signed)
Patient ID: Arthur Smith, male   DOB: 1951-01-05, 71 y.o.   MRN: [id number] BP (!) 88/75   Pulse 100   Temp 97.9 F (36.6 C) (Oral)   Resp (!) 22   Ht 5\' 8"  (1.727 m)   Wt 87.3 kg   SpO2 100%   BMI 29.26 kg/m  Alert, oriented x4, speech is clear and fluent Moving all extremities well Wound is clean Will transfer to 4np

## 2022-04-27 NOTE — Evaluation (Signed)
Occupational Therapy Evaluation Patient Details Name: Arthur Smith MRN: [id number] DOB: 09-11-1950 Today's Date: 04/27/2022   History of Present Illness Pt admittted 9/8 for  L3-S1 decompression, instrumentation interbody fusion. Post op hypotension and blood loss anemia, transferred to ICU.  PMH:  CAD, diabetes, GERD, history of significant spinal stenosis and prior surgeries.  He has lower extremity claudication and paresthesias, some associated urinary retention.   Clinical Impression   Pt was functioning independently prior to admission. Presents with LE weakness and sensation changes, impaired standing balance and decreased awareness of safety and deficits. Pt requires min assist for bed mobility and sit to stand and moderate assistance for ambulation with RW. He needs set up to moderate assistance for ADLs. Recommending intensive rehab in [facility] prior to return home with his supportive wife.      Recommendations for follow up therapy are one component of a multi-disciplinary discharge planning process, led by the attending physician.  Recommendations may be updated based on patient status, additional functional criteria and insurance authorization.   Follow Up Recommendations  Acute inpatient rehab (3hours/day)    Assistance Recommended at Discharge Frequent or constant Supervision/Assistance  Patient can return home with the following A lot of help with walking and/or transfers;A lot of help with bathing/dressing/bathroom;Assistance with cooking/housework;Assist for transportation;Help with stairs or ramp for entrance    Functional Status Assessment  Patient has had a recent decline in their functional status and demonstrates the ability to make significant improvements in function in a reasonable and predictable amount of time.  Equipment Recommendations   (RW)    Recommendations for Other Services       Precautions / Restrictions Precautions Precautions: Back;Fall Precaution  Comments: pt reports multiple back surgeries, reinforced BLT Restrictions Weight Bearing Restrictions: No      Mobility Bed Mobility Overal bed mobility: Needs Assistance Bed Mobility: Rolling, Sidelying to Sit Rolling: Min guard Sidelying to sit: Min assist       General bed mobility comments: cues for technique, increased time, light min assist to raise trunk    Transfers Overall transfer level: Needs assistance Equipment used: Rolling walker (2 wheels) Transfers: Sit to/from Stand Sit to Stand: Min assist           General transfer comment: cues for hand placement, assist to rise and steady, pt denied lightheadedness      Balance Overall balance assessment: Needs assistance   Sitting balance-Leahy Scale: Good     Standing balance support: Bilateral upper extremity supported Standing balance-Leahy Scale: Poor Standing balance comment: reliant on B UE and mod assist for dynamic balance                           ADL either performed or assessed with clinical judgement   ADL                                               Vision Ability to See in Adequate Light: 0 Adequate Patient Visual Report: No change from baseline       Perception     Praxis      Pertinent Vitals/Pain Pain Assessment Pain Assessment: Faces Faces Pain Scale: Hurts a little bit Pain Location: back Pain Descriptors / Indicators: Discomfort Pain Intervention(s): Monitored during session, Repositioned     Hand Dominance Right   Extremity/Trunk  Assessment Upper Extremity Assessment Upper Extremity Assessment: Overall WFL for tasks assessed   Lower Extremity Assessment Lower Extremity Assessment: Defer to PT evaluation   Cervical / Trunk Assessment Cervical / Trunk Assessment: Back Surgery   Communication Communication Communication: No difficulties   Cognition Arousal/Alertness: Awake/alert Behavior During Therapy: WFL for tasks  assessed/performed Overall Cognitive Status: Impaired/Different from baseline Area of Impairment: Safety/judgement                         Safety/Judgement: Decreased awareness of safety, Decreased awareness of deficits     General Comments: pt needing cues to monitor for fatigue and need to take rest break     General Comments       Exercises     Shoulder Instructions      Home Living Family/patient expects to be discharged to:: Private residence Living Arrangements: Spouse/significant other Available Help at Discharge: Family;Available 24 hours/day Type of Home: House Home Access: Stairs to enter [employer] of Steps: 5 Entrance Stairs-Rails: Right;Left;Can reach both Home Layout: One level     Bathroom Shower/Tub: Chief Strategy Officer: Standard     Home Equipment: Shower seat;Grab bars - tub/shower;Adaptive equipment Adaptive Equipment: Reacher;Long-handled shoe horn        Prior Functioning/Environment Prior Level of Function : Independent/Modified Independent;Driving                        OT Problem List: Decreased strength;Impaired balance (sitting and/or standing);Decreased knowledge of use of DME or AE;Decreased safety awareness      OT Treatment/Interventions: Self-care/ADL training;DME and/or AE instruction;Therapeutic activities;Patient/family education;Balance training    OT Goals(Current goals can be found in the care plan section) Acute Rehab OT Goals OT Goal Formulation: With patient Time For Goal Achievement: 05/11/22 Potential to Achieve Goals: Good  OT Frequency: Min 2X/week    Co-evaluation              AM-PAC OT "6 Clicks" Daily Activity     Outcome Measure Help from another person eating meals?: None Help from another person taking care of personal grooming?: A Little Help from another person toileting, which includes using toliet, bedpan, or urinal?: A Lot Help from another person  bathing (including washing, rinsing, drying)?: A Lot Help from another person to put on and taking off regular upper body clothing?: A Little Help from another person to put on and taking off regular lower body clothing?: A Lot 6 Click Score: 16   End of Session Equipment Utilized During Treatment: Rolling walker (2 wheels);Gait belt Nurse Communication: Mobility status  Activity Tolerance: Patient tolerated treatment well Patient left: in chair;with call bell/phone within reach;with chair alarm set  OT Visit Diagnosis: Unsteadiness on feet (R26.81);Other abnormalities of gait and mobility (R26.89);Muscle weakness (generalized) (M62.81)                Time: 0623-7628 OT Time Calculation (min): 25 min Charges:  OT General Charges $OT Visit: 1 Visit OT Evaluation $OT Eval Moderate Complexity: 1 Mod  Berna Spare, OTR/L Acute Rehabilitation Services Office: [phone]   Evern Bio 04/27/2022, 10:17 AM

## 2022-04-27 NOTE — Plan of Care (Signed)
Problem: Education: Goal: Knowledge of General Education information will improve Description: Including pain rating scale, medication(s)/side effects and non-pharmacologic comfort measures Outcome: Progressing   Problem: Health Behavior/Discharge Planning: Goal: Ability to manage health-related needs will improve Outcome: Progressing   Problem: Clinical Measurements: Goal: Ability to maintain clinical measurements within normal limits will improve Outcome: Progressing Goal: Will remain free from infection Outcome: Progressing Goal: Diagnostic test results will improve Outcome: Progressing Goal: Respiratory complications will improve Outcome: Progressing Goal: Cardiovascular complication will be avoided Outcome: Progressing   Problem: Activity: Goal: Risk for activity intolerance will decrease Outcome: Progressing   Problem: Nutrition: Goal: Adequate nutrition will be maintained Outcome: Progressing   Problem: Coping: Goal: Level of anxiety will decrease Outcome: Progressing   Problem: Elimination: Goal: Will not experience complications related to bowel motility Outcome: Progressing Goal: Will not experience complications related to urinary retention Outcome: Progressing   Problem: Pain Managment: Goal: General experience of comfort will improve Outcome: Progressing   Problem: Safety: Goal: Ability to remain free from injury will improve Outcome: Progressing   Problem: Skin Integrity: Goal: Risk for impaired skin integrity will decrease Outcome: Progressing   Problem: Education: Goal: Ability to verbalize activity precautions or restrictions will improve Outcome: Progressing Goal: Knowledge of the prescribed therapeutic regimen will improve Outcome: Progressing Goal: Understanding of discharge needs will improve Outcome: Progressing   Problem: Activity: Goal: Ability to avoid complications of mobility impairment will improve Outcome: Progressing Goal:  Ability to tolerate increased activity will improve Outcome: Progressing Goal: Will remain free from falls Outcome: Progressing   Problem: Bowel/Gastric: Goal: Gastrointestinal status for postoperative course will improve Outcome: Progressing   Problem: Clinical Measurements: Goal: Ability to maintain clinical measurements within normal limits will improve Outcome: Progressing Goal: Postoperative complications will be avoided or minimized Outcome: Progressing Goal: Diagnostic test results will improve Outcome: Progressing   Problem: Pain Management: Goal: Pain level will decrease Outcome: Progressing   Problem: Skin Integrity: Goal: Will show signs of wound healing Outcome: Progressing   Problem: Health Behavior/Discharge Planning: Goal: Identification of resources available to assist in meeting health care needs will improve Outcome: Progressing   Problem: Bladder/Genitourinary: Goal: Urinary functional status for postoperative course will improve Outcome: Progressing   Problem: Education: Goal: Ability to verbalize activity precautions or restrictions will improve Outcome: Progressing Goal: Knowledge of the prescribed therapeutic regimen will improve Outcome: Progressing Goal: Understanding of discharge needs will improve Outcome: Progressing   Problem: Education: Goal: Knowledge of General Education information will improve Description: Including pain rating scale, medication(s)/side effects and non-pharmacologic comfort measures Outcome: Progressing   Problem: Health Behavior/Discharge Planning: Goal: Ability to manage health-related needs will improve Outcome: Progressing   Problem: Clinical Measurements: Goal: Ability to maintain clinical measurements within normal limits will improve Outcome: Progressing Goal: Will remain free from infection Outcome: Progressing Goal: Diagnostic test results will improve Outcome: Progressing Goal: Respiratory  complications will improve Outcome: Progressing Goal: Cardiovascular complication will be avoided Outcome: Progressing   Problem: Activity: Goal: Risk for activity intolerance will decrease Outcome: Progressing   Problem: Nutrition: Goal: Adequate nutrition will be maintained Outcome: Progressing   Problem: Coping: Goal: Level of anxiety will decrease Outcome: Progressing   Problem: Elimination: Goal: Will not experience complications related to bowel motility Outcome: Progressing Goal: Will not experience complications related to urinary retention Outcome: Progressing   Problem: Pain Managment: Goal: General experience of comfort will improve Outcome: Progressing   Problem: Safety: Goal: Ability to remain free from injury will improve Outcome: Progressing  Problem: Skin Integrity: Goal: Risk for impaired skin integrity will decrease Outcome: Progressing   

## 2022-04-27 NOTE — TOC Initial Note (Signed)
Transition of Care [facility]) - Initial/Assessment Note    Patient Details  Name: Arthur Smith MRN: [id number] Date of Birth: 1951/04/28  [facility] CM/SW Contact:    Tom-Johnson, Hershal Coria, RN Phone Number: 04/27/2022, 3:23 PM  Clinical Narrative:                  CM spoke with patient at bedside about needs for post hospital transition. Patient had L3-S1 decompression, instrumentation interbody fusion by Dr. Franky Macho on 04/24/22.  From home with wife. Retired. Independent with care prior to admission. Has a cane and walking sticks at home.  PCP is Kirby Funk, MD [facility] [address]  CIR recommend and will admit patient pending Insurance authorization and bed availability.  CM will continue to follow as patient progresses towards discharge.   Expected Discharge Plan: IP Rehab Facility Barriers to Discharge: Continued Medical Work up   Patient Goals and CMS Choice Patient states their goals for this hospitalization and ongoing recovery are:: To go to rehab and then return home. CMS Medicare.gov Compare Post Acute Care list provided to:: Patient Choice offered to / list presented to : Patient, Spouse  Expected Discharge Plan and Services Expected Discharge Plan: IP Rehab Facility   Discharge Planning Services: CM Consult Post Acute Care Choice: IP Rehab Living arrangements for the past 2 months: Single Family Home                           HH Arranged: NA HH Agency: NA        Prior Living Arrangements/Services Living arrangements for the past 2 months: Single Family Home Lives with:: Spouse Patient language and need for interpreter reviewed:: Yes Do you feel safe going back to the place where you live?: Yes      Need for Family Participation in Patient Care: Yes (Comment) Care giver support system in place?: Yes (comment)   Criminal Activity/Legal Involvement Pertinent to Current Situation/Hospitalization: No - Comment as  needed  Activities of Daily Living      Permission Sought/Granted Permission sought to share information with : Case Manager, Magazine features editor, Family Supports Permission granted to share information with : Yes, Verbal Permission Granted              Emotional Assessment Appearance:: Appears stated age Attitude/Demeanor/Rapport: Engaged, Gracious Affect (typically observed): Accepting, Appropriate, Calm, Hopeful, Pleasant Orientation: : Oriented to Self, Oriented to Place, Oriented to  Time, Oriented to Situation Alcohol / Substance Use: Not Applicable Psych Involvement: No (comment)  Admission diagnosis:  Lumbar back pain [M54.50] Spondylolisthesis of lumbar region [M43.16] Patient Active Problem List   Diagnosis Date Noted   Lumbar back pain 04/24/2022   Spondylolisthesis of lumbar region 04/24/2022   Spinal stenosis of lumbar region    ABLA (acute blood loss anemia)    Hypotension    Diabetes mellitus type 2, uncontrolled 12/03/2019   Duodenitis 12/02/2019   Renal insufficiency 12/02/2019   BPH with obstruction/lower urinary tract symptoms 11/18/2018   Obesity 11/18/2018   Hyperlipidemia LDL goal <70 11/15/2018   CAD S/P percutaneous coronary angioplasty 11/15/2018   Acute urinary retention 11/15/2018   Hyperglycemia 11/15/2018   NSTEMI (non-ST elevated myocardial infarction) (HCC) 11/14/2018   Back pain 09/02/2018   Lumbar stenosis with neurogenic claudication 11/08/2014   Herniated lumbar intervertebral disc 05/01/2014   Hereditary and idiopathic peripheral neuropathy 05/02/2013   Thoracic root lesions, not elsewhere classified 05/02/2013  PCP:  Kirby Funk, MD Pharmacy:   [facility] [address], [place] - [address] [place] [address] Phone: [phone] Fax: [phone]  [id number] [place], [place] - [address] AT CORNER OF [facility] [address] [place]  [address] Phone: [phone] Fax: [phone]     Social Determinants of Health (SDOH) Interventions    Readmission Risk Interventions     No data to display

## 2022-04-27 NOTE — Consult Note (Addendum)
NAME:  Arthur Smith, MRN:  [id number], DOB:  08/18/1950, LOS: 3 ADMISSION DATE:  04/24/2022, CONSULTATION DATE:  04/26/2022 REFERRING MD:  Dr Conchita Paris, CHIEF COMPLAINT:  Hypotension   History of Present Illness:  Patient is a 71 yo Male w/ pertinent PMH of spinal stemosis, lumbar decompression 3-4, 4-5, 5-sacral 1 presents to [facility] for lumbar fusion surgery by NSG.    Patient has been having back pain with walking and standing. MRI showing stensosis at L 3-4, L 4-5, L5-S1. Coming in today on 9/8 for elective lumbar fusion procedure by Dr. Franky Macho.  During procedure patient had drop in BP likely due to blood loss. Patient given blood transfusion. Patient extubated post procedure. Neo started. BP improved with transfusion and fluids. NSG transferring to ICU overnight for closer monitoring. PCCM consulted.  Increased lethargy, decreased BP, anemia  Pertinent  Medical History   Past Medical History:  Diagnosis Date   Arthritis    BPH without obstruction/lower urinary tract symptoms    Bronchitis with asthma, acute 11/2011   CAD S/P percutaneous coronary angioplasty 11/15/2018   Severe 2 vessel obstructive CAD:. Ost-pCx 95% (DES PCI  STENT SYNERGY DES 3.5X28) &p-mCx 100% (DES PCI STENT SYNERGY DES 2.25X32). pRCA 90%, mRCA 85% & rPDA 90%.dLAD ~40%.  EF 55 to 60%.  Moderately elevated LVEDP.  No R WMA.  Plan staged PCI to 3 RCA-PDA lesions 4/1   Depression    denies   Diabetes mellitus without complication (HCC)    Type II- Diet controlled   Frequency of urination    URINATION AT NIGHT, PAINFUL SLOW STREAM    GERD (gastroesophageal reflux disease)    occ   Hyperlipidemia    Neuromuscular disorder (HCC)    DIZZINESS   PERIPHERAL NEUROPATHY    Neuropathy    No pertinent past medical history    RINGING RIGHT EAR   NSTEMI (non-ST elevated myocardial infarction) (HCC) 11/13/2018   Recurrent upper respiratory infection (URI)    SOB CURRENT COLD   Shortness of breath    d/t "cold" 12/02/11    Significant Hospital Events: Including procedures, antibiotic start and stop dates in addition to other pertinent events   9/8 post posterior lumbar interbody fusion 9/9 weaned off pressors and was doing fine 9/10-AKI 9/11 Transiently on pressors yesterday    Interim History / Subjective:  Transiently on pressors yesterday  Mental status normal UOP 2.7L  Objective   Blood pressure 118/76, pulse 82, temperature 98.6 F (37 C), temperature source Oral, resp. rate 12, height 5\' 8"  (1.727 m), weight 87.3 kg, SpO2 100 %.        Intake/Output Summary (Last 24 hours) at 04/27/2022 0804 Last data filed at 04/27/2022 0600 Gross per 24 hour  Intake 1227.76 ml  Output 2750 ml  Net -1522.24 ml   Filed Weights   04/24/22 0634 04/24/22 2123  Weight: 81.6 kg 87.3 kg    Physical Exam: General: Elderly , well-appearing, no acute distress HENT: [place], AT, OP clear, MMM Eyes: EOMI, no scleral icterus Respiratory: Clear to auscultation bilaterally.  No crackles, wheezing or rales Cardiovascular: RRR, -M/R/G, no JVD GI: BS+, soft, nontender Extremities:-Edema,-tenderness Neuro: AAO x4, CNII-XII grossly intact GU: Condom cath in place  Hg 7.8>9.4 CO2 18 Cr 1.86>3.3  Resolved Hospital Problem list     Assessment & Plan:  AKI 2/2 ATN -Did have some hypotension earlier requiring pressors perioperatively, anemia that required transfusion that may have contributed to the kidney insult. Cr may be peaking. Continues  to have excellent UOP with acceptable electrolytes -Maintain normotension -Off pressors -LR@75cc  x 12 hours -MAP goal 65-70 -Trend BMET/Cr -Replete electrolytes as needed -No indication for dialysis with good UOP -Avoid nephrotoxic agents -Renally dose medications  Acute encephalopathy secondary to oversedation in setting of AKI - resolved -Minimize sedating meds -On pain regimen for post-op pain -Renally dose meds  Spinal stenosis s/p posterior lumbar interbody  fusion L3-4, L4-5, L5-S1 -Post-op management per NSG  Post acute blood loss anemia S/p PRBC x 2. Hg 7.8>9.4 -Hg goal >7  History of bronchitis/asthma -As needed bronchodilators  History of coronary artery disease, hyperlipidemia -On statin -Hold off on metoprolol  BPH -On finasteride and Flomax  GERD -PPI  Peripheral neuropathy -Gabapentin on hold because of kidney dysfunction   Best Practice (right click and "Reselect all SmartList Selections" daily)   Diet/type: Regular consistency (see orders) DVT prophylaxis: prophylactic heparin  GI prophylaxis: PPI Lines: N/A Foley:  N/A Code Status:  full code Last date of multidisciplinary goals of care discussion [Per primary]  Care Time: 50 min  Mechele Collin, M.D. [facility] Pulmonary/Critical Care Medicine 04/27/2022 8:04 AM   Please see Amion for pager number to reach on-call Pulmonary and Critical Care Team.

## 2022-04-27 NOTE — Progress Notes (Signed)
Physical Therapy Treatment Patient Details Name: Arthur Smith MRN: [id number] DOB: [birth year] Today's Date: 04/27/2022   History of Present Illness Pt admittted 9/8 for  L3-S1 decompression, instrumentation interbody fusion. Post op hypotension and blood loss anemia, transferred to ICU.  PMH:  CAD, diabetes, GERD, history of significant spinal stenosis and prior surgeries.  He has lower extremity claudication and paresthesias, some associated urinary retention.    PT Comments    Patient progressing well towards PT goals. Session focused on progressive ambulation and functional mobility. Tolerated gait training with Min-mod A and use of RW for support with chair follow for safety. Noted to have difficulty advancing LLE, bil knee instability/buckling and poor awareness of safety/fatigue putting pt at increased risk for falls. Pt motivated to mobilize. VSS on RA with appropriate BP response to activity. Encouraged OOB to chair for all meals and there ex of BLEs. Reviewed back precautions. Continues to be a good AIR candidate. Will follow.   Recommendations for follow up therapy are one component of a multi-disciplinary discharge planning process, led by the attending physician.  Recommendations may be updated based on patient status, additional functional criteria and insurance authorization.  Follow Up Recommendations  Acute inpatient rehab (3hours/day)     Assistance Recommended at Discharge Frequent or constant Supervision/Assistance  Patient can return home with the following A lot of help with walking and/or transfers;A little help with bathing/dressing/bathroom;Help with stairs or ramp for entrance;Assist for transportation;Assistance with cooking/housework   Equipment Recommendations  Rolling walker (2 wheels);BSC/3in1    Recommendations for Other Services       Precautions / Restrictions Precautions Precautions: Back;Fall Precaution Booklet Issued: Yes (comment) Precaution  Comments: pt reports multiple back surgeries, reinforced BLT Restrictions Weight Bearing Restrictions: No     Mobility  Bed Mobility Overal bed mobility: Needs Assistance Bed Mobility: Rolling, Sidelying to Sit Rolling: Min guard Sidelying to sit: Min assist       General bed mobility comments: cues for log roll technique, increased time, light min assist to raise trunk.    Transfers Overall transfer level: Needs assistance Equipment used: Rolling walker (2 wheels) Transfers: Sit to/from Stand Sit to Stand: Min assist           General transfer comment: cues for hand placement, assist to rise and steady, pt denied lightheadedness. Transferred to chair post ambulation.    Ambulation/Gait Ambulation/Gait assistance: Min assist, Mod assist Gait Distance (Feet): 75 Feet (x2 bouts) Assistive device: Rolling walker (2 wheels) Gait Pattern/deviations: Step-through pattern, Narrow base of support, Knees buckling Gait velocity: decreased Gait velocity interpretation: <1.8 ft/sec, indicate of risk for recurrent falls   General Gait Details: Slow, unsteady gait with incoordination LLE initially with knee buckling and difficulty advancing LLE; cues to widen BOS and noted to have right knee instability second half of gait needing Mod A at times for balance and Min A otherwise. Heavy reliance on UEs for support. Poor ability to self monitor for fatigue. 1 rest break.   Stairs             Wheelchair Mobility    Modified Rankin (Stroke Patients Only)       Balance Overall balance assessment: Needs assistance Sitting-balance support: Feet supported, Bilateral upper extremity supported Sitting balance-Leahy Scale: Good     Standing balance support: During functional activity Standing balance-Leahy Scale: Poor Standing balance comment: reliant on B UE and mod assist for dynamic balance  Cognition Arousal/Alertness:  Awake/alert Behavior During Therapy: WFL for tasks assessed/performed Overall Cognitive Status: Impaired/Different from baseline Area of Impairment: Safety/judgement                         Safety/Judgement: Decreased awareness of safety, Decreased awareness of deficits     General Comments: pt needing cues to monitor for fatigue and need to take rest break        Exercises      General Comments General comments (skin integrity, edema, etc.): VSS with appropriate BP resposne to activity.      Pertinent Vitals/Pain Pain Assessment Pain Assessment: Faces Faces Pain Scale: Hurts a little bit Pain Location: back Pain Descriptors / Indicators: Discomfort Pain Intervention(s): Monitored during session, Premedicated before session, Repositioned    Home Living Family/patient expects to be discharged to:: Private residence Living Arrangements: Spouse/significant other Available Help at Discharge: Family;Available 24 hours/day Type of Home: House Home Access: Stairs to enter Entrance Stairs-Rails: Right;Left;Can reach both Entrance Stairs-Number of Steps: 5   Home Layout: One level Home Equipment: Shower seat;Grab bars - tub/shower;Adaptive equipment      Prior Function            PT Goals (current goals can now be found in the care plan section) Progress towards PT goals: Progressing toward goals    Frequency    Min 5X/week      PT Plan Current plan remains appropriate    Co-evaluation              AM-PAC PT "6 Clicks" Mobility   Outcome Measure  Help needed turning from your back to your side while in a flat bed without using bedrails?: A Little Help needed moving from lying on your back to sitting on the side of a flat bed without using bedrails?: A Little Help needed moving to and from a bed to a chair (including a wheelchair)?: A Little Help needed standing up from a chair using your arms (e.g., wheelchair or bedside chair)?: A Little Help  needed to walk in hospital room?: A Lot Help needed climbing 3-5 steps with a railing? : Total 6 Click Score: 15    End of Session Equipment Utilized During Treatment: Gait belt Activity Tolerance: Patient tolerated treatment well Patient left: in chair;with call bell/phone within reach;with chair alarm set Nurse Communication: Mobility status PT Visit Diagnosis: Unsteadiness on feet (R26.81);Muscle weakness (generalized) (M62.81);Pain Pain - part of body:  (back)     Time: 3491-7915 PT Time Calculation (min) (ACUTE ONLY): 24 min  Charges:  $Gait Training: 8-22 mins                     Vale Haven, PT, DPT Acute Rehabilitation Services Secure chat preferred Office [phone]      Blake Divine A Lanier Ensign 04/27/2022, 10:33 AM

## 2022-04-28 DIAGNOSIS — M545 Low back pain, unspecified: Secondary | ICD-10-CM | POA: Diagnosis not present

## 2022-04-28 LAB — CBC WITH DIFFERENTIAL/PLATELET
Abs Immature Granulocytes: 0.07 10*3/uL (ref 0.00–0.07)
Basophils Absolute: 0 10*3/uL (ref 0.0–0.1)
Basophils Relative: 0 %
Eosinophils Absolute: 0.2 10*3/uL (ref 0.0–0.5)
Eosinophils Relative: 2 %
HCT: 27.7 % — ABNORMAL LOW (ref 39.0–52.0)
Hemoglobin: 9.7 g/dL — ABNORMAL LOW (ref 13.0–17.0)
Immature Granulocytes: 1 %
Lymphocytes Relative: 12 %
Lymphs Abs: 1.1 10*3/uL (ref 0.7–4.0)
MCH: 31.7 pg (ref 26.0–34.0)
MCHC: 35 g/dL (ref 30.0–36.0)
MCV: 90.5 fL (ref 80.0–100.0)
Monocytes Absolute: 1 10*3/uL (ref 0.1–1.0)
Monocytes Relative: 11 %
Neutro Abs: 6.3 10*3/uL (ref 1.7–7.7)
Neutrophils Relative %: 74 %
Platelets: 155 10*3/uL (ref 150–400)
RBC: 3.06 MIL/uL — ABNORMAL LOW (ref 4.22–5.81)
RDW: 14.8 % (ref 11.5–15.5)
WBC: 8.7 10*3/uL (ref 4.0–10.5)
nRBC: 0 % (ref 0.0–0.2)

## 2022-04-28 LAB — RENAL FUNCTION PANEL
Albumin: 2.4 g/dL — ABNORMAL LOW (ref 3.5–5.0)
Anion gap: 5 (ref 5–15)
BUN: 41 mg/dL — ABNORMAL HIGH (ref 8–23)
CO2: 25 mmol/L (ref 22–32)
Calcium: 8.6 mg/dL — ABNORMAL LOW (ref 8.9–10.3)
Chloride: 109 mmol/L (ref 98–111)
Creatinine, Ser: 3.48 mg/dL — ABNORMAL HIGH (ref 0.61–1.24)
GFR, Estimated: 18 mL/min — ABNORMAL LOW (ref >60)
Glucose, Bld: 153 mg/dL — ABNORMAL HIGH (ref 70–99)
Phosphorus: 3.7 mg/dL (ref 2.5–4.6)
Potassium: 5.2 mmol/L — ABNORMAL HIGH (ref 3.5–5.1)
Sodium: 139 mmol/L (ref 135–145)

## 2022-04-28 LAB — GLUCOSE, CAPILLARY
Glucose-Capillary: 134 mg/dL — ABNORMAL HIGH (ref 70–99)
Glucose-Capillary: 182 mg/dL — ABNORMAL HIGH (ref 70–99)
Glucose-Capillary: 148 mg/dL — ABNORMAL HIGH (ref 70–99)
Glucose-Capillary: 143 mg/dL — ABNORMAL HIGH (ref 70–99)
Glucose-Capillary: 135 mg/dL — ABNORMAL HIGH (ref 70–99)

## 2022-04-28 MED ORDER — FLEET ENEMA 7-19 GM/118ML RE ENEM
1.0000 | ENEMA | Freq: Once | RECTAL | Status: AC
  Administered 2022-04-28: 1 via RECTAL
  Filled 2022-04-28: qty 1

## 2022-04-28 MED ORDER — BISACODYL 10 MG RE SUPP
10.0000 mg | Freq: Once | RECTAL | Status: AC
  Administered 2022-04-28: 10 mg via RECTAL
  Filled 2022-04-28: qty 1

## 2022-04-28 MED ORDER — SODIUM ZIRCONIUM CYCLOSILICATE 5 G PO PACK
5.0000 g | PACK | Freq: Once | ORAL | Status: AC
  Administered 2022-04-28: 5 g via ORAL
  Filled 2022-04-28: qty 1

## 2022-04-28 MED ORDER — ORAL CARE MOUTH RINSE: 15.0000 mL | OROMUCOSAL | Status: DC | PRN

## 2022-04-28 MED ORDER — SENNOSIDES-DOCUSATE SODIUM 8.6-50 MG PO TABS: 2.0000 | ORAL_TABLET | Freq: Every day | ORAL | Status: DC | PRN

## 2022-04-28 NOTE — PMR Pre-admission (Signed)
PMR Admission Coordinator Pre-Admission Assessment  Patient: Arthur Smith is an 71 y.o., male MRN: [id number] DOB: 06-15-1951 Height: _0  (172.7 cm) Weight: 87.3 kg  Insurance Information  PRIMARY: Medicare A & B      Policy#: [id number]      Subscriber: Patient Pre-Cert#: [id number]       Benefits:  Phone #: [email or web address]      Eff. Date: 07/17/2016     Deduct: $1600      Out of Pocket Max: $0      Life Max: $0 CIR: 100%      SNF: 20 full days Outpatient: 80%/ 20% copay      Home Health: 100%       DME: 80%/20% copay      Providers: patient's choice  SECONDARY: [employer] NAP      Policy#: [id number] Group # [id number]     Phone#: [phone]    The "Data Collection Information Summary" for patients in Inpatient Rehabilitation Facilities with attached "Privacy Act [place] Records" was provided and verbally reviewed with: Patient and Family  Emergency Contact Information Contact Information     Name Relation Home Work Mobile   [name],[name] Spouse [phone]  [place] Daughter   [phone]   [name], [name] Daughter   [phone]       Current Medical History  Patient Admitting Diagnosis: L3-S1 decompression/fusion History of Present Illness: Patient is a 71 year old male admitted to [facility] 04/24/2022 for lumbar decompression (3-4, 4-5, 5-S1) with Dr. Christella Noa.  Transient hypotension noted in the OR, associated with blood loss. Received just over a liter Cell Saver return, albumin, phenylephrine.  He was able to be extubated to room air, transitioned to ICU for monitoring on low-dose phenylephrine. On 04/26/22, patient had another drop in Hgb to 7.8 due to bleeding from wound, 2 units transfused 04/26/22. Hgb now at 9.4. Patient's creatinine increased on 04/26/22 from 1.3 to 4.38 received fluid bolus and with creatinine now at 3.48. Therapies are recommending intensive rehab prior to discharging home for improved independence.       Patient's medical record from [facility] has been reviewed by the rehabilitation admission coordinator and physician.  Past Medical History  Past Medical History:  Diagnosis Date   Arthritis    BPH without obstruction/lower urinary tract symptoms    Bronchitis with asthma, acute 11/2011   CAD S/P percutaneous coronary angioplasty 11/15/2018   Severe 2 vessel obstructive CAD:. Ost-pCx 95% (DES PCI  STENT SYNERGY DES 3.5X28) &p-mCx 100% (DES PCI STENT SYNERGY DES 2.25X32). pRCA 90%, mRCA 85% & rPDA 90%.dLAD ~40%.  EF 55 to 60%.  Moderately elevated LVEDP.  No R WMA.  Plan staged PCI to 3 RCA-PDA lesions 4/1   Depression    denies   Diabetes mellitus without complication (HCC)    Type II- Diet controlled   Frequency of urination    URINATION AT NIGHT, PAINFUL SLOW STREAM    GERD (gastroesophageal reflux disease)    occ   Hyperlipidemia    Neuromuscular disorder (HCC)    DIZZINESS   PERIPHERAL NEUROPATHY    Neuropathy    No pertinent past medical history    RINGING RIGHT EAR   NSTEMI (non-ST elevated myocardial infarction) ([facility]) 11/13/2018   Recurrent upper respiratory infection (URI)    SOB CURRENT COLD   Shortness of breath    d/t "cold" 12/02/11    Has the patient had major surgery during 100 days prior to admission? Yes  Family  History   family history includes Cancer in his mother; Neuropathy in his brother; Stroke in his father.  Current Medications  Current Facility-Administered Medications:    0.9 %  sodium chloride infusion, 250 mL, Intravenous, Continuous, Byrum, Rose Fillers, MD, Stopped at 04/25/22 2008   0.9 %  sodium chloride infusion, 250 mL, Intravenous, Continuous, Olalere, Adewale A, MD   acetaminophen (TYLENOL) tablet 650 mg, 650 mg, Oral, Q4H PRN, 650 mg at 04/26/22 2322 **OR** acetaminophen (TYLENOL) suppository 650 mg, 650 mg, Rectal, Q4H PRN, Ashok Pall, MD   albuterol (PROVENTIL) (2.5 MG/3ML) 0.083% nebulizer solution 2.5 mg, 2.5 mg, Nebulization, Q4H  PRN, Rollene Rotunda, John D, PA-C   aspirin EC tablet 81 mg, 81 mg, Oral, See admin instructions, Ashok Pall, MD   atorvastatin (LIPITOR) tablet 80 mg, 80 mg, Oral, QHS, Ashok Pall, MD, 80 mg at 04/27/22 2210   B-complex with vitamin C tablet 1 tablet, 1 tablet, Oral, Daily, Ashok Pall, MD, 1 tablet at 04/28/22 0906   bisacodyl (DULCOLAX) EC tablet 5 mg, 5 mg, Oral, Daily PRN, Ashok Pall, MD, 5 mg at 04/27/22 1024   Chlorhexidine Gluconate Cloth 2 % PADS 6 each, 6 each, Topical, Q0600, Collene Gobble, MD, 6 each at 04/28/22 0430   cholecalciferol (VITAMIN D3) 25 MCG (1000 UNIT) tablet 1,000 Units, 1,000 Units, Oral, Daily, Ashok Pall, MD, 1,000 Units at 04/28/22 0906   diazepam (VALIUM) tablet 5 mg, 5 mg, Oral, Q6H PRN, Ashok Pall, MD, 5 mg at 04/26/22 0804   ferrous sulfate tablet 325 mg, 325 mg, Oral, Daily, Ashok Pall, MD, 325 mg at 04/28/22 0906   finasteride (PROSCAR) tablet 5 mg, 5 mg, Oral, Daily, Ashok Pall, MD, 5 mg at 04/28/22 0906   fluticasone (FLONASE) 50 MCG/ACT nasal spray 2 spray, 2 spray, Each Nare, Daily PRN, Ashok Pall, MD   heparin injection 5,000 Units, 5,000 Units, Subcutaneous, Q8H, Ashok Pall, MD, 5,000 Units at 04/28/22 0617   HYDROmorphone (DILAUDID) injection 1 mg, 1 mg, Intravenous, Q2H PRN, Ashok Pall, MD, 1 mg at 04/24/22 2134   insulin aspart (novoLOG) injection 0-15 Units, 0-15 Units, Subcutaneous, Q4H, Ashok Pall, MD, 3 Units at 04/28/22 0825   menthol-cetylpyridinium (CEPACOL) lozenge 3 mg, 1 lozenge, Oral, PRN **OR** phenol (CHLORASEPTIC) mouth spray 1 spray, 1 spray, Mouth/Throat, PRN, Ashok Pall, MD   nitroGLYCERIN (NITROSTAT) SL tablet 0.4 mg, 0.4 mg, Sublingual, Q5 min PRN, Ashok Pall, MD   ondansetron (ZOFRAN) tablet 4 mg, 4 mg, Oral, Q6H PRN **OR** ondansetron (ZOFRAN) injection 4 mg, 4 mg, Intravenous, Q6H PRN, Ashok Pall, MD   Oral care mouth rinse, 15 mL, Mouth Rinse, PRN, Ashok Pall, MD   oxyCODONE (Oxy  IR/ROXICODONE) immediate release tablet 10 mg, 10 mg, Oral, Q3H PRN, Ashok Pall, MD   oxyCODONE (Oxy IR/ROXICODONE) immediate release tablet 5 mg, 5 mg, Oral, Q3H PRN, Ashok Pall, MD, 5 mg at 04/27/22 1729   oxyCODONE (OXYCONTIN) 12 hr tablet 10 mg, 10 mg, Oral, Q12H, Consuella Lose, MD, 10 mg at 04/28/22 0906   pantoprazole (PROTONIX) EC tablet 40 mg, 40 mg, Oral, Daily, Cabbell, Marylyn Ishihara, MD, 40 mg at 04/28/22 0906   senna-docusate (Senokot-S) tablet 2 tablet, 2 tablet, Oral, QHS PRN, Ashok Pall, MD   sodium chloride flush (NS) 0.9 % injection 3 mL, 3 mL, Intravenous, Q12H, Cabbell, Kyle, MD, 3 mL at 04/28/22 0907   sodium chloride flush (NS) 0.9 % injection 3 mL, 3 mL, Intravenous, PRN, Ashok Pall, MD   sodium zirconium cyclosilicate (LOKELMA)  packet 5 g, 5 g, Oral, Once, Katsadouros, Vasilios, MD   tamsulosin (FLOMAX) capsule 0.4 mg, 0.4 mg, Oral, Q12H, Cabbell, Kyle, MD, 0.4 mg at 04/28/22 7371   vitamin B-12 (CYANOCOBALAMIN) tablet 100 mcg, 100 mcg, Oral, Daily, Ashok Pall, MD, 100 mcg at 04/28/22 0906   zolpidem (AMBIEN) tablet 5 mg, 5 mg, Oral, QHS PRN, Ashok Pall, MD  Patients Current Diet:  Diet Order             Diet heart healthy/carb modified Room service appropriate? Yes; Fluid consistency: Thin  Diet effective now                   Precautions / Restrictions Precautions Precautions: Fall Precaution Booklet Issued: Yes (comment) Precaution Comments: pt reports multiple back surgeries, reinforced BLT Restrictions Weight Bearing Restrictions: No   Has the patient had 2 or more falls or a fall with injury in the past year? No  Prior Activity Level Community (5-7x/wk): Independent without AD prior to admission, driving  Prior Functional Level Self Care: Did the patient need help bathing, dressing, using the toilet or eating? Independent  Indoor Mobility: Did the patient need assistance with walking from room to room (with or without device)?  Independent  Stairs: Did the patient need assistance with internal or external stairs (with or without device)? Independent  Functional Cognition: Did the patient need help planning regular tasks such as shopping or remembering to take medications? Independent  Patient Information Are you of Hispanic, Latino/a,or Spanish origin?: A. No, not of Hispanic, Latino/a, or Spanish origin What is your race?: A. White Do you need or want an interpreter to communicate with a doctor or health care staff?: 0. No  Patient's Response To:  Health Literacy and Transportation Is the patient able to respond to health literacy and transportation needs?: Yes Health Literacy - How often do you need to have someone help you when you read instructions, pamphlets, or other written material from your doctor or pharmacy?: Never In the past 12 months, has lack of transportation kept you from medical appointments or from getting medications?: No In the past 12 months, has lack of transportation kept you from meetings, work, or from getting things needed for daily living?: No  Development worker, international aid / Equipment Home Equipment: Shower seat, Grab bars - tub/shower, Adaptive equipment  Prior Device Use: Indicate devices/aids used by the patient prior to current illness, exacerbation or injury? None of the above  Current Functional Level Cognition  Overall Cognitive Status: Impaired/Different from baseline Orientation Level: Oriented X4 Safety/Judgement: Decreased awareness of safety, Decreased awareness of deficits General Comments: pt needing cues to monitor for fatigue and need to take rest break    Extremity Assessment (includes Sensation/Coordination)  Upper Extremity Assessment: Overall WFL for tasks assessed  Lower Extremity Assessment: Defer to PT evaluation RLE Deficits / Details: grossly 2+/5/ pt reports that this am he couldnt feel below the knees however after movement he cant feel below hips.  Nurse  made aware. RLE Sensation: decreased light touch, decreased proprioception LLE Deficits / Details: grossly 2+/5/ pt reports that this am he couldnt feel below the knees however after movement he cant feel below hips.  Nurse made aware. LLE Sensation: decreased light touch, decreased proprioception    ADLs       Mobility  Overal bed mobility: Needs Assistance Bed Mobility: Rolling, Sidelying to Sit Rolling: Min guard Sidelying to sit: Min assist Sit to sidelying: Mod assist General bed mobility comments: cues  for log roll technique, increased time, light min assist to raise trunk.    Transfers  Overall transfer level: Needs assistance Equipment used: Rolling walker (2 wheels) Transfers: Sit to/from Stand Sit to Stand: Min assist General transfer comment: cues for hand placement, assist to rise and steady, pt denied lightheadedness. Transferred to chair post ambulation.    Ambulation / Gait / Stairs / Wheelchair Mobility  Ambulation/Gait Ambulation/Gait assistance: Min assist, Mod assist Gait Distance (Feet): 75 Feet (x2 bouts) Assistive device: Rolling walker (2 wheels) Gait Pattern/deviations: Step-through pattern, Narrow base of support, Knees buckling General Gait Details: Slow, unsteady gait with incoordination LLE initially with knee buckling and difficulty advancing LLE; cues to widen BOS and noted to have right knee instability second half of gait needing Mod A at times for balance and Min A otherwise. Heavy reliance on UEs for support. Poor ability to self monitor for fatigue. 1 rest break. Gait velocity: decreased Gait velocity interpretation: <1.8 ft/sec, indicate of risk for recurrent falls    Posture / Balance Dynamic Sitting Balance Sitting balance - Comments: relies on UE support Balance Overall balance assessment: Needs assistance Sitting-balance support: Feet supported, Bilateral upper extremity supported Sitting balance-Leahy Scale: Good Sitting balance -  Comments: relies on UE support Standing balance support: During functional activity Standing balance-Leahy Scale: Poor Standing balance comment: reliant on B UE and mod assist for dynamic balance    Special needs/care consideration Skin Post surgical   Previous Home Environment (from acute therapy documentation) Living Arrangements: Spouse/significant other  Lives With: Spouse Available Help at Discharge: Family, Available 24 hours/day Type of Home: House Home Layout: One level Home Access: Stairs to enter Entrance Stairs-Rails: Right, Left, Can reach both Entrance Stairs-Number of Steps: 5 Bathroom Shower/Tub: Chiropodist: Standard Bathroom Accessibility: Yes How Accessible: Accessible via walker  Discharge Living Setting Plans for Discharge Living Setting: Patient's home, Lives with (comment) (spouse) Type of Home at Discharge: House Discharge Home Layout: One level Discharge Home Access: Stairs to enter Entrance Stairs-Rails: Right, Left, Can reach both Entrance Stairs-Number of Steps: 5 Discharge Bathroom Shower/Tub: Tub/shower unit Discharge Bathroom Toilet: Standard Discharge Bathroom Accessibility: Yes How Accessible: Accessible via walker Does the patient have any problems obtaining your medications?: No  Social/Family/Support Systems Patient Roles: Spouse Anticipated Caregiver: wife, Joycelyn Schmid Anticipated Ambulance person Information: [phone] Caregiver Availability: 24/7 Discharge Plan Discussed with Primary Caregiver: Yes Is Caregiver In Agreement with Plan?: Yes Does Caregiver/Family have Issues with Lodging/Transportation while Pt is in Rehab?: No  Goals Patient/Family Goal for Rehab: Mod I, PT OT Expected length of stay: 7-10 days Pt/Family Agrees to Admission and willing to participate: Yes Program Orientation Provided & Reviewed with Pt/Caregiver Including Roles  & Responsibilities: Yes  Barriers to Discharge: Insurance for SNF  coverage  Decrease burden of Care through IP rehab admission: Othern/a  Possible need for SNF placement upon discharge: not aniticpated  Patient Condition: I have reviewed medical records from [facility], spoken with  TOC , and patient and spouse. I met with patient at the bedside and discussed via phone for inpatient rehabilitation assessment.  Patient will benefit from ongoing PT and OT, can actively participate in 3 hours of therapy a day 5 days of the week, and can make measurable gains during the admission.  Patient will also benefit from the coordinated team approach during an Inpatient Acute Rehabilitation admission.  The patient will receive intensive therapy as well as Rehabilitation physician, nursing, social worker, and care management interventions.  Due  to safety, skin/wound care, disease management, medication administration, pain management, and patient education the patient requires 24 hour a day rehabilitation nursing.  The patient is currently min/Mod A  with mobility and basic ADLs.  Discharge setting and therapy post discharge at home with home health is anticipated.  Patient has agreed to participate in the Acute Inpatient Rehabilitation Program and will admit today.  Preadmission Screen Completed By:  Nelly Laurence, 04/28/2022 9:25 AM ______________________________________________________________________   Discussed status with Dr. Celedonio Miyamoto on 04/29/22 at 35 and received approval for admission today.  Admission Coordinator:  Nelly Laurence, time 1012/Date 04/29/22   Assessment/Plan: Diagnosis:lumbar spinal stenosis  s/p L3-S1 decompression/fusion Does the need for close, 24 hr/day Medical supervision in concert with the patient's rehab needs make it unreasonable for this patient to be served in a less intensive setting? Yes Co-Morbidities requiring supervision/potential complications: Back pain, HLD, BPH, CAD, DM type 2 Due to bladder management, bowel management,  safety, skin/wound care, disease management, medication administration, pain management, and patient education, does the patient require 24 hr/day rehab nursing? Yes Does the patient require coordinated care of a physician, rehab nurse, PT, OT, and SLP to address physical and functional deficits in the context of the above medical diagnosis(es)? Yes Addressing deficits in the following areas: balance, endurance, locomotion, strength, transferring, bowel/bladder control, bathing, dressing, feeding, grooming, toileting, and psychosocial support Can the patient actively participate in an intensive therapy program of at least 3 hrs of therapy 5 days a week? Yes The potential for patient to make measurable gains while on inpatient rehab is excellent Anticipated functional outcomes upon discharge from inpatient rehab: modified independent PT, modified independent OT, n/a SLP Estimated rehab length of stay to reach the above functional goals is: 7-10 Anticipated discharge destination: Home 10. Overall Rehab/Functional Prognosis: excellent   MD Signature: Jennye Boroughs

## 2022-04-28 NOTE — Consult Note (Signed)
NAME:  Arthur Smith, MRN:  [id number], DOB:  21-Oct-1950, LOS: 4 ADMISSION DATE:  04/24/2022, CONSULTATION DATE:  04/26/2022 REFERRING MD:  Dr Conchita Paris, CHIEF COMPLAINT:  Hypotension   History of Present Illness:  Patient is a 71 yo Male w/ pertinent PMH of spinal stemosis, lumbar decompression 3-4, 4-5, 5-sacral 1 presents to [facility] for lumbar fusion surgery by NSG.    Patient has been having back pain with walking and standing. MRI showing stensosis at L 3-4, L 4-5, L5-S1. Coming in today on 9/8 for elective lumbar fusion procedure by Dr. Franky Macho.  During procedure patient had drop in BP likely due to blood loss. Patient given blood transfusion. Patient extubated post procedure. Neo started. BP improved with transfusion and fluids. NSG transferring to ICU overnight for closer monitoring. PCCM consulted.  Increased lethargy, decreased BP, anemia  Pertinent  Medical History   Past Medical History:  Diagnosis Date   Arthritis    BPH without obstruction/lower urinary tract symptoms    Bronchitis with asthma, acute 11/2011   CAD S/P percutaneous coronary angioplasty 11/15/2018   Severe 2 vessel obstructive CAD:. Ost-pCx 95% (DES PCI  STENT SYNERGY DES 3.5X28) &p-mCx 100% (DES PCI STENT SYNERGY DES 2.25X32). pRCA 90%, mRCA 85% & rPDA 90%.dLAD ~40%.  EF 55 to 60%.  Moderately elevated LVEDP.  No R WMA.  Plan staged PCI to 3 RCA-PDA lesions 4/1   Depression    denies   Diabetes mellitus without complication (HCC)    Type II- Diet controlled   Frequency of urination    URINATION AT NIGHT, PAINFUL SLOW STREAM    GERD (gastroesophageal reflux disease)    occ   Hyperlipidemia    Neuromuscular disorder (HCC)    DIZZINESS   PERIPHERAL NEUROPATHY    Neuropathy    No pertinent past medical history    RINGING RIGHT EAR   NSTEMI (non-ST elevated myocardial infarction) (HCC) 11/13/2018   Recurrent upper respiratory infection (URI)    SOB CURRENT COLD   Shortness of breath    d/t "cold" 12/02/11    Significant Hospital Events: Including procedures, antibiotic start and stop dates in addition to other pertinent events   9/8 post posterior lumbar interbody fusion 9/9 weaned off pressors and was doing fine 9/10-AKI 9/11 Transiently on pressors yesterday    Interim History / Subjective:  On room air Eating breakfast Ambulating in ICU yesterday UOP 4.7  Objective   Blood pressure 130/71, pulse 89, temperature 98 F (36.7 C), temperature source Oral, resp. rate 19, height 5\' 8"  (1.727 m), weight 87.3 kg, SpO2 97 %.        Intake/Output Summary (Last 24 hours) at 04/28/2022 0852 Last data filed at 04/28/2022 0808 Gross per 24 hour  Intake 1615.6 ml  Output 4601 ml  Net -2985.4 ml   Filed Weights   04/24/22 0634 04/24/22 2123  Weight: 81.6 kg 87.3 kg   Physical Exam: General: Well-appearing, no acute distress HENT: [place], AT, OP clear, MMM Eyes: EOMI, no scleral icterus Respiratory: Clear to auscultation bilaterally.  No crackles, wheezing or rales Cardiovascular: RRR, -M/R/G, no JVD GI: BS+, soft, nontender Extremities:-Edema,-tenderness Neuro: AAO x4, CNII-XII grossly intact Skin: Intact, no rashes or bruising Psych: Normal mood, normal affect  K 5.2 BUN/Cr improving 41/3.48 Hg stable 9.7  Resolved Hospital Problem list     Assessment & Plan:  AKI 2/2 ATN -Did have some hypotension earlier this admission requiring pressors perioperatively, anemia that required transfusion that may have contributed to  the kidney insult. Cr peaked with now post-ATN diuresis  -Off pressors >48 hours -S/p IVF resuscitation. Hold on further IVF -No indication for dialysis with good UOP -Nephrology following -MAP goal 65-70 -Trend BMET/Cr -Replete electrolytes as needed -Avoid nephrotoxic agents -Renally dose medications  Acute encephalopathy secondary to oversedation in setting of AKI - resolved -Minimize sedating meds -On pain regimen for post-op pain -Renally dose  meds  Spinal stenosis s/p posterior lumbar interbody fusion L3-4, L4-5, L5-S1 -Post-op management per NSG  Post acute blood loss anemia S/p PRBC x 2. Hg 7.8>9.4 -Hg goal >7  History of bronchitis/asthma -As needed bronchodilators  History of coronary artery disease, hyperlipidemia -On statin -Hold off on metoprolol  BPH -On finasteride and Flomax  GERD -PPI  Peripheral neuropathy -Gabapentin on hold because of kidney dysfunction  Pulmonary available as needed. Please call for any questions or concerns   Best Practice (right click and "Reselect all SmartList Selections" daily)   Diet/type: Regular consistency (see orders) DVT prophylaxis: prophylactic heparin  GI prophylaxis: PPI Lines: N/A Foley:  N/A Code Status:  full code Last date of multidisciplinary goals of care discussion [Per primary]  Care Time: 25 min  Mechele Collin, M.D. [facility] Pulmonary/Critical Care Medicine 04/28/2022 8:52 AM   See Amion for personal pager For hours between 7 PM to 7 AM, please call Elink for urgent questions

## 2022-04-28 NOTE — Progress Notes (Signed)
Physical Therapy Treatment Patient Details Name: Arthur Smith MRN: [id number] DOB: 1951/01/08 Today's Date: 04/28/2022   History of Present Illness Pt admittted 9/8 for  L3-S1 decompression, instrumentation interbody fusion. Post op hypotension and blood loss anemia, transferred to ICU.  PMH:  CAD, diabetes, GERD, history of significant spinal stenosis and prior surgeries.  He has lower extremity claudication and paresthesias, some associated urinary retention.    PT Comments    Patient progressing well towards PT goals. Session focused on progressive ambulation and functional transfers. Pt reports improved pain. Requires Mod A for bed mobility and for standing initially progressing to Min A with max cues. Difficulty recalling back precautions despite reviewing them throughout session. Worked on sit to stand transfers due to bil quad weakness. Wife present at end of session. Continues to be motivated and a good candidate for AIR. Will follow.    Recommendations for follow up therapy are one component of a multi-disciplinary discharge planning process, led by the attending physician.  Recommendations may be updated based on patient status, additional functional criteria and insurance authorization.  Follow Up Recommendations  Acute inpatient rehab (3hours/day)     Assistance Recommended at Discharge Frequent or constant Supervision/Assistance  Patient can return home with the following A lot of help with walking and/or transfers;A little help with bathing/dressing/bathroom;Help with stairs or ramp for entrance;Assist for transportation;Assistance with cooking/housework   Equipment Recommendations  Rolling walker (2 wheels);BSC/3in1    Recommendations for Other Services       Precautions / Restrictions Precautions Precautions: Fall Precaution Booklet Issued: No Precaution Comments: Difficulty recalling back precautions, reviewed Restrictions Weight Bearing Restrictions: No      Mobility  Bed Mobility Overal bed mobility: Needs Assistance Bed Mobility: Rolling, Sidelying to Sit Rolling: Min guard Sidelying to sit: Mod assist, HOB elevated       General bed mobility comments: Cues for log roll technique, increased time, assist needed for trunk elevation.    Transfers Overall transfer level: Needs assistance Equipment used: Rolling walker (2 wheels) Transfers: Sit to/from Stand Sit to Stand: Mod assist, +2 physical assistance, Min assist           General transfer comment: Mod A of 2 to stand from EOB initially with cues for hand placement, difficulty at mid way during transition, stood from chair x5 progressing to Min A with emphasis on slow descent.    Ambulation/Gait Ambulation/Gait assistance: Min assist Gait Distance (Feet): 100 Feet (+50') Assistive device: Rolling walker (2 wheels) Gait Pattern/deviations: Step-through pattern, Narrow base of support Gait velocity: decreased Gait velocity interpretation: <1.8 ft/sec, indicate of risk for recurrent falls   General Gait Details: Slow, mildly unsteady gait with use of RW, heavy reliance on UEs for support. narrow BoS. 1 standing rest break with cues to relax shoulders.   Stairs             Wheelchair Mobility    Modified Rankin (Stroke Patients Only)       Balance Overall balance assessment: Needs assistance Sitting-balance support: Feet supported, Bilateral upper extremity supported Sitting balance-Leahy Scale: Good Sitting balance - Comments: relies on UE support   Standing balance support: During functional activity Standing balance-Leahy Scale: Poor Standing balance comment: reliant on B UE and Min A balance                            Cognition Arousal/Alertness: Awake/alert Behavior During Therapy: WFL for tasks assessed/performed Overall Cognitive Status:  Impaired/Different from baseline Area of Impairment: Memory, Safety/judgement                          Safety/Judgement: Decreased awareness of safety, Decreased awareness of deficits     General Comments: Needs constant reminders of back precautions throughout session and unable to recall despite being told numerous times.        Exercises Other Exercises Other Exercises: sit to stand fromchair x5 with cues for technique    General Comments General comments (skin integrity, edema, etc.): VSS with pre activity BP 133/77, post activity BP 139/74      Pertinent Vitals/Pain Pain Assessment Pain Assessment: Faces Faces Pain Scale: Hurts a little bit Pain Location: back Pain Descriptors / Indicators: Discomfort, Grimacing Pain Intervention(s): Monitored during session, Repositioned, Premedicated before session    Home Living   Living Arrangements: Spouse/significant other Available Help at Discharge: Family;Available 24 hours/day Type of Home: House Home Access: Stairs to enter Entrance Stairs-Rails: Right;Left;Can reach both Entrance Stairs-Number of Steps: 5   Home Layout: One level        Prior Function            PT Goals (current goals can now be found in the care plan section) Progress towards PT goals: Progressing toward goals    Frequency    Min 5X/week      PT Plan Current plan remains appropriate    Co-evaluation              AM-PAC PT "6 Clicks" Mobility   Outcome Measure  Help needed turning from your back to your side while in a flat bed without using bedrails?: A Little Help needed moving from lying on your back to sitting on the side of a flat bed without using bedrails?: A Lot Help needed moving to and from a bed to a chair (including a wheelchair)?: A Lot Help needed standing up from a chair using your arms (e.g., wheelchair or bedside chair)?: A Lot Help needed to walk in hospital room?: A Little Help needed climbing 3-5 steps with a railing? : Total 6 Click Score: 13    End of Session Equipment Utilized During  Treatment: Gait belt Activity Tolerance: Patient tolerated treatment well Patient left: in chair;with call bell/phone within reach;with chair alarm set Nurse Communication: Mobility status PT Visit Diagnosis: Unsteadiness on feet (R26.81);Muscle weakness (generalized) (M62.81);Pain Pain - part of body:  (back)     Time: 6606-3016 PT Time Calculation (min) (ACUTE ONLY): 33 min  Charges:  $Gait Training: 8-22 mins $Therapeutic Activity: 8-22 mins                     Vale Haven, PT, DPT Acute Rehabilitation Services Secure chat preferred Office [phone]      Blake Divine A Berda Shelvin 04/28/2022, 10:24 AM

## 2022-04-28 NOTE — Progress Notes (Signed)
Inpatient Rehab Admissions Coordinator:    Met with patient and wife at bedside. We will likely have an available bed for patient tomorrow on CIR. They are in agreement. Will continue to follow.   Rehab Admissons Coordinator  , PT, GCS [phone]  

## 2022-04-28 NOTE — Progress Notes (Signed)
[facility] Progress Note   Subjective:   Seen in room - no family present this AM.  Feeling fine.  No N/V/D, no F/C/dyspnea.    Objective Vitals:   04/28/22 0700 04/28/22 0808 04/28/22 0836 04/28/22 1000  BP: 130/71   132/76  Pulse: 91  89 98  Resp: 17  19 16   Temp:  98 F (36.7 C)    TempSrc:  Oral    SpO2: 90%  97% 99%  Weight:      Height:       Physical Exam Gen: sitting in bed appearing well  Eyes: anicteric, EOMI ENT: MMM Neck: supple CV:  RRR, no rub Abd: soft, mildly distended + BS Back: clear on deep inspiration GU: condom cath, bladder not palpable Extr:  no edema Neuro: grossly nonfocal Skin: no rashes  Additional Objective Labs: Basic Metabolic Panel: Recent Labs  Lab 04/24/22 2158 04/25/22 0435 04/26/22 0834 04/27/22 0420 04/28/22 0622  NA 139   < > 137 137 139  K 4.1   < > 4.9 5.0 5.2*  CL 108   < > 107 111 109  CO2 19*   < > 21* 18* 25  GLUCOSE 187*   < > 172* 167* 153*  BUN 26*   < > 41* 47* 41*  CREATININE 1.30*   < > 3.30* 4.38* 3.48*  CALCIUM 8.0*   < > 8.2* 8.1* 8.6*  PHOS 4.9*  --   --   --  3.7   < > = values in this interval not displayed.   Liver Function Tests: Recent Labs  Lab 04/28/22 0622  ALBUMIN 2.4*   No results for input(s): "LIPASE", "AMYLASE" in the last 168 hours. CBC: Recent Labs  Lab 04/24/22 2158 04/24/22 2158 04/25/22 0435 04/26/22 0834 04/26/22 1849 04/27/22 0420 04/28/22 0622  WBC 13.3*  --  9.9 9.9  --  10.4 8.7  NEUTROABS  --    < > 7.6 7.0  --  7.5 6.3  HGB 10.1*  --  8.5* 7.8* 9.7* 9.4* 9.7*  HCT 29.7*  --  25.0* 23.4* 27.7* 26.9* 27.7*  MCV 93.4  --  92.9 95.5  --  90.0 90.5  PLT 126*  --  112* 114*  --  120* 155   < > = values in this interval not displayed.   Blood Culture    Component Value Date/Time   SDES URINE, CLEAN CATCH 04/15/2014 1534   SPECREQUEST NONE 04/15/2014 1534   CULT NO GROWTH Performed at [facility] 04/15/2014 1534   REPTSTATUS 04/17/2014  FINAL 04/15/2014 1534    Cardiac Enzymes: No results for input(s): "CKTOTAL", "CKMB", "CKMBINDEX", "TROPONINI" in the last 168 hours. CBG: Recent Labs  Lab 04/27/22 1547 04/27/22 1910 04/27/22 2341 04/28/22 0333 04/28/22 0806  GLUCAP 97 123* 165* 134* 182*   Iron Studies: No results for input(s): "IRON", "TIBC", "TRANSFERRIN", "FERRITIN" in the last 72 hours. @lablastinr3 @ Studies/Results: 06/28/22 RENAL  Result Date: 04/27/2022 CLINICAL DATA:  Acute kidney injury EXAM: RENAL / URINARY TRACT ULTRASOUND COMPLETE COMPARISON:  None Available. FINDINGS: Right Kidney: Renal measurements: 13.7 x 6.2 x 6.0 cm = volume: 265 mL. Echogenicity within normal limits. No mass or hydronephrosis visualized. Left Kidney: Renal measurements: 12.1 x 6.3 x 6.5 cm = volume: 259 mL. Echogenicity within normal limits. No mass or hydronephrosis visualized. Bladder: Appears normal for degree of bladder distention. Other: Prostate enlargement. IMPRESSION: No acute findings.  No hydronephrosis. Prostate enlargement. Electronically Signed   By:  Charlett Nose M.D.   On: 04/27/2022 21:08   Medications:  sodium chloride Stopped (04/25/22 2008)   sodium chloride      aspirin EC  81 mg Oral See admin instructions   atorvastatin  80 mg Oral QHS   B-complex with vitamin C  1 tablet Oral Daily   bisacodyl  10 mg Rectal Once   Chlorhexidine Gluconate Cloth  6 each Topical Q0600   cholecalciferol  1,000 Units Oral Daily   ferrous sulfate  325 mg Oral Daily   finasteride  5 mg Oral Daily   heparin injection (subcutaneous)  5,000 Units Subcutaneous Q8H   insulin aspart  0-15 Units Subcutaneous Q4H   oxyCODONE  10 mg Oral Q12H   pantoprazole  40 mg Oral Daily   sodium chloride flush  3 mL Intravenous Q12H   sodium zirconium cyclosilicate  5 g Oral Once   tamsulosin  0.4 mg Oral Q12H   vitamin B-12  100 mcg Oral Daily    Assessment/Plan: Arthur Smith is an 71 y.o. male with BPH f/b Dr. Liliane Shi, HL, GERD, CAD, diet  controlled DM, lumbar spinal stenosis s/p lumbar fusion 9/8 who is seen for AKI.   **AKI, severe, nonoliguric:  Pt with normal baseline renal function now with severe but nonoliguric AKI s/p intraoperative blood loss and hypotension.  Renal US nromal. Suspect ATN. UA pending.  Non oliguric status portends better prognosis and in fact kidney function already improving.   No indications for RRT currently, doubt will develop but will follow closely.  Volume looks ok - no need for IVF unless unable to take PO. Avoid hypovolemia, hypotension, nephrotoxins.  Avoid use of morphine, baclofen.  Dose meds for crcl.     **ABLA: transfuse per primary for Hb < 7.    **Hypotension:  presumed hypovolemia/blood loss.  Off pressors now.  Home metoprolol on hold.    **BPH: per above renal US ok no obstruction; on home finasteride and tamsulosin.    **GERD: on PPI, ok to continue   **Spinal stenosis s/p fusion:  plans for CIR once acute issues resolved   Will follow, contact with concerns.   I hear CIR planned for tomorrow - I think that's fine.   Estill Bakes MD 04/28/2022, 11:01 AM  [facility] Pager: [phone]

## 2022-04-29 ENCOUNTER — Other Ambulatory Visit: Payer: Self-pay

## 2022-04-29 ENCOUNTER — Encounter (HOSPITAL_COMMUNITY): Payer: Self-pay | Admitting: Physical Medicine & Rehabilitation

## 2022-04-29 ENCOUNTER — Inpatient Hospital Stay (HOSPITAL_COMMUNITY)
Admission: RE | Admit: 2022-04-29 | Discharge: 2022-05-01 | DRG: 949 | Discharge status: Against Medical Advice | Payer: Medicare Other | Source: Intra-hospital | Attending: Physical Medicine and Rehabilitation | Admitting: Physical Medicine and Rehabilitation

## 2022-04-29 DIAGNOSIS — Z7982 Long term (current) use of aspirin: Secondary | ICD-10-CM | POA: Diagnosis not present

## 2022-04-29 DIAGNOSIS — E875 Hyperkalemia: Secondary | ICD-10-CM | POA: Diagnosis present

## 2022-04-29 DIAGNOSIS — N179 Acute kidney failure, unspecified: Secondary | ICD-10-CM

## 2022-04-29 DIAGNOSIS — E785 Hyperlipidemia, unspecified: Secondary | ICD-10-CM | POA: Diagnosis present

## 2022-04-29 DIAGNOSIS — M792 Neuralgia and neuritis, unspecified: Secondary | ICD-10-CM | POA: Diagnosis not present

## 2022-04-29 DIAGNOSIS — G479 Sleep disorder, unspecified: Secondary | ICD-10-CM | POA: Diagnosis present

## 2022-04-29 DIAGNOSIS — G9340 Encephalopathy, unspecified: Secondary | ICD-10-CM | POA: Diagnosis present

## 2022-04-29 DIAGNOSIS — I10 Essential (primary) hypertension: Secondary | ICD-10-CM | POA: Diagnosis present

## 2022-04-29 DIAGNOSIS — Z7902 Long term (current) use of antithrombotics/antiplatelets: Secondary | ICD-10-CM

## 2022-04-29 DIAGNOSIS — Z5329 Procedure and treatment not carried out because of patient's decision for other reasons: Secondary | ICD-10-CM | POA: Diagnosis present

## 2022-04-29 DIAGNOSIS — Z79899 Other long term (current) drug therapy: Secondary | ICD-10-CM | POA: Diagnosis not present

## 2022-04-29 DIAGNOSIS — K5900 Constipation, unspecified: Secondary | ICD-10-CM | POA: Diagnosis not present

## 2022-04-29 DIAGNOSIS — N400 Enlarged prostate without lower urinary tract symptoms: Secondary | ICD-10-CM | POA: Diagnosis present

## 2022-04-29 DIAGNOSIS — Z91012 Allergy to eggs: Secondary | ICD-10-CM | POA: Diagnosis not present

## 2022-04-29 DIAGNOSIS — N170 Acute kidney failure with tubular necrosis: Secondary | ICD-10-CM | POA: Diagnosis present

## 2022-04-29 DIAGNOSIS — Z955 Presence of coronary angioplasty implant and graft: Secondary | ICD-10-CM

## 2022-04-29 DIAGNOSIS — Z48811 Encounter for surgical aftercare following surgery on the nervous system: Secondary | ICD-10-CM | POA: Diagnosis not present

## 2022-04-29 DIAGNOSIS — F32A Depression, unspecified: Secondary | ICD-10-CM | POA: Diagnosis present

## 2022-04-29 DIAGNOSIS — E1142 Type 2 diabetes mellitus with diabetic polyneuropathy: Secondary | ICD-10-CM | POA: Diagnosis present

## 2022-04-29 DIAGNOSIS — F515 Nightmare disorder: Secondary | ICD-10-CM | POA: Diagnosis present

## 2022-04-29 DIAGNOSIS — H9190 Unspecified hearing loss, unspecified ear: Secondary | ICD-10-CM | POA: Diagnosis present

## 2022-04-29 DIAGNOSIS — Z981 Arthrodesis status: Secondary | ICD-10-CM | POA: Diagnosis not present

## 2022-04-29 DIAGNOSIS — Z823 Family history of stroke: Secondary | ICD-10-CM | POA: Diagnosis not present

## 2022-04-29 DIAGNOSIS — D62 Acute posthemorrhagic anemia: Secondary | ICD-10-CM | POA: Diagnosis present

## 2022-04-29 DIAGNOSIS — M5136 Other intervertebral disc degeneration, lumbar region: Secondary | ICD-10-CM | POA: Diagnosis not present

## 2022-04-29 DIAGNOSIS — I2510 Atherosclerotic heart disease of native coronary artery without angina pectoris: Secondary | ICD-10-CM | POA: Diagnosis present

## 2022-04-29 DIAGNOSIS — K219 Gastro-esophageal reflux disease without esophagitis: Secondary | ICD-10-CM

## 2022-04-29 DIAGNOSIS — I878 Other specified disorders of veins: Secondary | ICD-10-CM | POA: Diagnosis not present

## 2022-04-29 DIAGNOSIS — E739 Lactose intolerance, unspecified: Secondary | ICD-10-CM | POA: Diagnosis present

## 2022-04-29 DIAGNOSIS — Z87891 Personal history of nicotine dependence: Secondary | ICD-10-CM | POA: Diagnosis not present

## 2022-04-29 DIAGNOSIS — I252 Old myocardial infarction: Secondary | ICD-10-CM | POA: Diagnosis not present

## 2022-04-29 DIAGNOSIS — R112 Nausea with vomiting, unspecified: Secondary | ICD-10-CM | POA: Diagnosis not present

## 2022-04-29 DIAGNOSIS — G959 Disease of spinal cord, unspecified: Secondary | ICD-10-CM | POA: Diagnosis present

## 2022-04-29 DIAGNOSIS — I25810 Atherosclerosis of coronary artery bypass graft(s) without angina pectoris: Secondary | ICD-10-CM | POA: Diagnosis present

## 2022-04-29 DIAGNOSIS — R110 Nausea: Secondary | ICD-10-CM | POA: Diagnosis not present

## 2022-04-29 LAB — RENAL FUNCTION PANEL
Albumin: 2.5 g/dL — ABNORMAL LOW (ref 3.5–5.0)
Anion gap: 5 (ref 5–15)
BUN: 36 mg/dL — ABNORMAL HIGH (ref 8–23)
CO2: 26 mmol/L (ref 22–32)
Calcium: 9 mg/dL (ref 8.9–10.3)
Chloride: 108 mmol/L (ref 98–111)
Creatinine, Ser: 2.7 mg/dL — ABNORMAL HIGH (ref 0.61–1.24)
GFR, Estimated: 25 mL/min — ABNORMAL LOW (ref >60)
Glucose, Bld: 137 mg/dL — ABNORMAL HIGH (ref 70–99)
Phosphorus: 4.3 mg/dL (ref 2.5–4.6)
Potassium: 5.9 mmol/L — ABNORMAL HIGH (ref 3.5–5.1)
Sodium: 139 mmol/L (ref 135–145)

## 2022-04-29 LAB — CBC
HCT: 28.6 % — ABNORMAL LOW (ref 39.0–52.0)
Hemoglobin: 9.8 g/dL — ABNORMAL LOW (ref 13.0–17.0)
MCH: 31.4 pg (ref 26.0–34.0)
MCHC: 34.3 g/dL (ref 30.0–36.0)
MCV: 91.7 fL (ref 80.0–100.0)
Platelets: 192 10*3/uL (ref 150–400)
RBC: 3.12 MIL/uL — ABNORMAL LOW (ref 4.22–5.81)
RDW: 14.5 % (ref 11.5–15.5)
WBC: 9.1 10*3/uL (ref 4.0–10.5)
nRBC: 0 % (ref 0.0–0.2)

## 2022-04-29 LAB — GLUCOSE, CAPILLARY
Glucose-Capillary: 110 mg/dL — ABNORMAL HIGH (ref 70–99)
Glucose-Capillary: 135 mg/dL — ABNORMAL HIGH (ref 70–99)
Glucose-Capillary: 154 mg/dL — ABNORMAL HIGH (ref 70–99)
Glucose-Capillary: 155 mg/dL — ABNORMAL HIGH (ref 70–99)
Glucose-Capillary: 157 mg/dL — ABNORMAL HIGH (ref 70–99)
Glucose-Capillary: 82 mg/dL (ref 70–99)

## 2022-04-29 MED ORDER — OXYCODONE HCL 5 MG PO TABS
5.0000 mg | ORAL_TABLET | ORAL | Status: DC | PRN
  Administered 2022-04-30 – 2022-05-01 (×2): 5 mg via ORAL
  Filled 2022-04-29 (×2): qty 1

## 2022-04-29 MED ORDER — DIAZEPAM 5 MG PO TABS: 5.0000 mg | ORAL_TABLET | Freq: Four times a day (QID) | ORAL | Status: DC | PRN

## 2022-04-29 MED ORDER — TRAZODONE HCL 50 MG PO TABS
25.0000 mg | ORAL_TABLET | Freq: Every evening | ORAL | Status: DC | PRN
  Administered 2022-04-29 – 2022-04-30 (×2): 50 mg via ORAL
  Filled 2022-04-29 (×2): qty 1

## 2022-04-29 MED ORDER — FERROUS SULFATE 325 (65 FE) MG PO TABS
325.0000 mg | ORAL_TABLET | Freq: Every day | ORAL | Status: DC
  Administered 2022-04-30 – 2022-05-01 (×2): 325 mg via ORAL
  Filled 2022-04-29 (×2): qty 1

## 2022-04-29 MED ORDER — HEPARIN SODIUM (PORCINE) 5000 UNIT/ML IJ SOLN: 5000.0000 [IU] | Freq: Three times a day (TID) | INTRAMUSCULAR | Status: DC

## 2022-04-29 MED ORDER — FINASTERIDE 5 MG PO TABS
5.0000 mg | ORAL_TABLET | Freq: Every day | ORAL | Status: DC
  Administered 2022-04-30 – 2022-05-01 (×2): 5 mg via ORAL
  Filled 2022-04-29 (×2): qty 1

## 2022-04-29 MED ORDER — HEPARIN SODIUM (PORCINE) 5000 UNIT/ML IJ SOLN
5000.0000 [IU] | Freq: Three times a day (TID) | INTRAMUSCULAR | Status: DC
  Administered 2022-04-29 – 2022-05-01 (×5): 5000 [IU] via SUBCUTANEOUS
  Filled 2022-04-29 (×5): qty 1

## 2022-04-29 MED ORDER — ACETAMINOPHEN 325 MG PO TABS
325.0000 mg | ORAL_TABLET | ORAL | Status: DC | PRN
  Administered 2022-04-29: 650 mg via ORAL
  Filled 2022-04-29: qty 2

## 2022-04-29 MED ORDER — METHOCARBAMOL 500 MG PO TABS: 500.0000 mg | ORAL_TABLET | Freq: Four times a day (QID) | ORAL | Status: DC | PRN

## 2022-04-29 MED ORDER — VITAMIN B-12 100 MCG PO TABS
100.0000 ug | ORAL_TABLET | Freq: Every day | ORAL | Status: DC
  Administered 2022-04-30 – 2022-05-01 (×2): 100 ug via ORAL
  Filled 2022-04-29 (×2): qty 1

## 2022-04-29 MED ORDER — ASPIRIN 81 MG PO TBEC
81.0000 mg | DELAYED_RELEASE_TABLET | Freq: Every day | ORAL | Status: DC
  Administered 2022-04-29 – 2022-05-01 (×3): 81 mg via ORAL
  Filled 2022-04-29 (×3): qty 1

## 2022-04-29 MED ORDER — PROCHLORPERAZINE 25 MG RE SUPP: 12.5000 mg | Freq: Four times a day (QID) | RECTAL | Status: DC | PRN

## 2022-04-29 MED ORDER — OXYCODONE HCL 5 MG PO TABS
10.0000 mg | ORAL_TABLET | ORAL | Status: DC | PRN
  Administered 2022-04-29 – 2022-04-30 (×3): 10 mg via ORAL
  Filled 2022-04-29 (×3): qty 2

## 2022-04-29 MED ORDER — ASPIRIN 81 MG PO TBEC: 81.0000 mg | DELAYED_RELEASE_TABLET | Freq: Every day | ORAL | Status: DC

## 2022-04-29 MED ORDER — ATORVASTATIN CALCIUM 80 MG PO TABS
80.0000 mg | ORAL_TABLET | Freq: Every day | ORAL | Status: DC
  Administered 2022-04-29 – 2022-04-30 (×2): 80 mg via ORAL
  Filled 2022-04-29 (×2): qty 1

## 2022-04-29 MED ORDER — PROCHLORPERAZINE MALEATE 5 MG PO TABS
5.0000 mg | ORAL_TABLET | Freq: Four times a day (QID) | ORAL | Status: DC | PRN
  Administered 2022-04-30: 5 mg via ORAL
  Filled 2022-04-29: qty 1

## 2022-04-29 MED ORDER — ALBUTEROL SULFATE (2.5 MG/3ML) 0.083% IN NEBU: 2.5000 mg | INHALATION_SOLUTION | RESPIRATORY_TRACT | Status: DC | PRN

## 2022-04-29 MED ORDER — SORBITOL 70 % SOLN: 30.0000 mL | Freq: Every day | Status: DC | PRN

## 2022-04-29 MED ORDER — PANTOPRAZOLE SODIUM 40 MG PO TBEC
40.0000 mg | DELAYED_RELEASE_TABLET | Freq: Every day | ORAL | Status: DC
  Administered 2022-04-30 – 2022-05-01 (×2): 40 mg via ORAL
  Filled 2022-04-29 (×2): qty 1

## 2022-04-29 MED ORDER — BISACODYL 5 MG PO TBEC: 5.0000 mg | DELAYED_RELEASE_TABLET | Freq: Every day | ORAL | Status: DC | PRN

## 2022-04-29 MED ORDER — B COMPLEX-C PO TABS
1.0000 | ORAL_TABLET | Freq: Every day | ORAL | Status: DC
  Administered 2022-04-30 – 2022-05-01 (×2): 1 via ORAL
  Filled 2022-04-29 (×2): qty 1

## 2022-04-29 MED ORDER — CLOPIDOGREL BISULFATE 75 MG PO TABS
75.0000 mg | ORAL_TABLET | Freq: Every day | ORAL | Status: DC
  Administered 2022-04-30 – 2022-05-01 (×2): 75 mg via ORAL
  Filled 2022-04-29 (×2): qty 1

## 2022-04-29 MED ORDER — VITAMIN D 25 MCG (1000 UNIT) PO TABS
1000.0000 [IU] | ORAL_TABLET | Freq: Every day | ORAL | Status: DC
  Administered 2022-04-30 – 2022-05-01 (×2): 1000 [IU] via ORAL
  Filled 2022-04-29 (×2): qty 1

## 2022-04-29 MED ORDER — INSULIN ASPART 100 UNIT/ML IJ SOLN: 0.0000 [IU] | Freq: Three times a day (TID) | INTRAMUSCULAR | Status: DC

## 2022-04-29 MED ORDER — FLUTICASONE PROPIONATE 50 MCG/ACT NA SUSP: 2.0000 | Freq: Every day | NASAL | Status: DC | PRN

## 2022-04-29 MED ORDER — GUAIFENESIN-DM 100-10 MG/5ML PO SYRP: 5.0000 mL | ORAL_SOLUTION | Freq: Four times a day (QID) | ORAL | Status: DC | PRN

## 2022-04-29 MED ORDER — OXYCODONE HCL ER 10 MG PO T12A
10.0000 mg | EXTENDED_RELEASE_TABLET | Freq: Two times a day (BID) | ORAL | Status: DC
  Administered 2022-04-29 – 2022-05-01 (×4): 10 mg via ORAL
  Filled 2022-04-29 (×4): qty 1

## 2022-04-29 MED ORDER — TAMSULOSIN HCL 0.4 MG PO CAPS
0.4000 mg | ORAL_CAPSULE | Freq: Two times a day (BID) | ORAL | Status: DC
  Administered 2022-04-29 – 2022-05-01 (×4): 0.4 mg via ORAL
  Filled 2022-04-29 (×4): qty 1

## 2022-04-29 MED ORDER — DIPHENHYDRAMINE HCL 12.5 MG/5ML PO ELIX: 12.5000 mg | ORAL_SOLUTION | Freq: Four times a day (QID) | ORAL | Status: DC | PRN

## 2022-04-29 MED ORDER — INSULIN ASPART 100 UNIT/ML IJ SOLN: 0.0000 [IU] | INTRAMUSCULAR | Status: DC

## 2022-04-29 MED ORDER — LIDOCAINE HCL URETHRAL/MUCOSAL 2 % EX GEL: 1.0000 | CUTANEOUS | Status: DC | PRN

## 2022-04-29 MED ORDER — PROCHLORPERAZINE EDISYLATE 10 MG/2ML IJ SOLN: 5.0000 mg | Freq: Four times a day (QID) | INTRAMUSCULAR | Status: DC | PRN

## 2022-04-29 MED FILL — Sodium Chloride IV Soln 0.9%: INTRAVENOUS | Qty: 2000 | Status: AC

## 2022-04-29 MED FILL — Sodium Chloride Irrigation Soln 0.9%: Qty: 3000 | Status: AC

## 2022-04-29 MED FILL — Heparin Sodium (Porcine) Inj 1000 Unit/ML: INTRAMUSCULAR | Qty: 60 | Status: AC

## 2022-04-29 NOTE — Progress Notes (Signed)
Inpatient Rehabilitation Admission Medication Review by a Pharmacist  A complete drug regimen review was completed for this patient to identify any potential clinically significant medication issues.  High Risk Drug Classes Is patient taking? Indication by Medication  Antipsychotic Yes Compazine- N/V  Anticoagulant Yes Heparin- VTE ppx  Antibiotic No   Opioid Yes Oxycontin, oxyIR- acute on chronic pain  Antiplatelet Yes Aspirin- CVA ppx  Hypoglycemics/insulin Yes Insulin- T2DM  Vasoactive Medication Yes Flomax- BPH related urinary hesitancy   Chemotherapy No   Other Yes Lipitor- HLD Proscar- BPH     Type of Medication Issue Identified Description of Issue Recommendation(s)  Drug Interaction(s) (clinically significant)     Duplicate Therapy     Allergy     No Medication Administration End Date     Incorrect Dose     Additional Drug Therapy Needed     Significant med changes from prior encounter (inform family/care partners about these prior to discharge).    Other  PTA meds: Gabapentin 800 mg po 4xday Tofranil 150 mg po hs Toprol XL 25 mg po qday NitroSL Omega-3 FA 1000 mg qday Zonegran 300 mg @ 6PM Restart PTA meds when and if necessary during CIR admission or at time of discharge, if warranted     Clinically significant medication issues were identified that warrant physician communication and completion of prescribed/recommended actions by midnight of the next day:  No   Time spent performing this drug regimen review (minutes):  30   Shailen Thielen BS, PharmD, BCPS Clinical Pharmacist 04/29/2022 3:09 PM  Contact: [phone] after 3 PM  "Be curious, not judgmental..." -Debbora Dus

## 2022-04-29 NOTE — Progress Notes (Signed)
PMR Admission Coordinator Pre-Admission Assessment  Patient: Arthur Smith is an 70 y.o., male MRN: [id number] DOB: 01/02/1951 Height: 5' 8" (172.7 cm) Weight: 87.3 kg  Insurance Information  PRIMARY: Medicare A & B      Policy#: [id number]      Subscriber: Patient Pre-Cert#: [id number]       Benefits:  Phone #: [email or web address]      Eff. Date: 07/17/2016     Deduct: $1600      Out of Pocket Max: $0      Life Max: $0 CIR: 100%      SNF: 20 full days Outpatient: 80%/ 20% copay      Home Health: 100%       DME: 80%/20% copay      Providers: patient's choice  SECONDARY: [employer] NAP      Policy#: [id number] Group # [id number]     Phone#: [phone]    The "Data Collection Information Summary" for patients in Inpatient Rehabilitation Facilities with attached "Privacy Act Statement-Health Care Records" was provided and verbally reviewed with: Patient and Family  Emergency Contact Information Contact Information     Name Relation Home Work Mobile   Michl,[name] Spouse [phone]  [phone]   Robitaille,Lhea Daughter   [phone]   Dawson,Emily Daughter   [phone]       Current Medical History  Patient Admitting Diagnosis: L3-S1 decompression/fusion History of Present Illness: Patient is a 70 year old male admitted to [facility] 04/24/2022 for lumbar decompression (3-4, 4-5, 5-S1) with Dr. Cabbell.  Transient hypotension noted in the OR, associated with blood loss. Received just over a liter Cell Saver return, albumin, phenylephrine.  He was able to be extubated to room air, transitioned to ICU for monitoring on low-dose phenylephrine. On 04/26/22, patient had another drop in Hgb to 7.8 due to bleeding from wound, 2 units transfused 04/26/22. Hgb now at 9.4. Patient's creatinine increased on 04/26/22 from 1.3 to 4.38 received fluid bolus and with creatinine now at 3.48. Therapies are recommending intensive rehab prior to discharging home for improved independence.       Patient's medical record from  has been reviewed by the rehabilitation admission coordinator and physician.  Past Medical History  Past Medical History:  Diagnosis Date   Arthritis    BPH without obstruction/lower urinary tract symptoms    Bronchitis with asthma, acute 11/2011   CAD S/P percutaneous coronary angioplasty 11/15/2018   Severe 2 vessel obstructive CAD:. Ost-pCx 95% (DES PCI  STENT SYNERGY DES 3.5X28) &p-mCx 100% (DES PCI STENT SYNERGY DES 2.25X32). pRCA 90%, mRCA 85% & rPDA 90%.dLAD ~40%.  EF 55 to 60%.  Moderately elevated LVEDP.  No R WMA.  Plan staged PCI to 3 RCA-PDA lesions 4/1   Depression    denies   Diabetes mellitus without complication (HCC)    Type II- Diet controlled   Frequency of urination    URINATION AT NIGHT, PAINFUL SLOW STREAM    GERD (gastroesophageal reflux disease)    occ   Hyperlipidemia    Neuromuscular disorder (HCC)    DIZZINESS   PERIPHERAL NEUROPATHY    Neuropathy    No pertinent past medical history    RINGING RIGHT EAR   NSTEMI (non-ST elevated myocardial infarction) (HCC) 11/13/2018   Recurrent upper respiratory infection (URI)    SOB CURRENT COLD   Shortness of breath    d/t "cold" 12/02/11    Has the patient had major surgery during 100 days prior to admission? Yes  Family   History   family history includes Cancer in his mother; Neuropathy in his brother; Stroke in his father.  Current Medications  Current Facility-Administered Medications:    0.9 %  sodium chloride infusion, 250 mL, Intravenous, Continuous, Byrum, Robert S, MD, Stopped at 04/25/22 2008   0.9 %  sodium chloride infusion, 250 mL, Intravenous, Continuous, Olalere, Adewale A, MD   acetaminophen (TYLENOL) tablet 650 mg, 650 mg, Oral, Q4H PRN, 650 mg at 04/26/22 2322 **OR** acetaminophen (TYLENOL) suppository 650 mg, 650 mg, Rectal, Q4H PRN, Cabbell, Kyle, MD   albuterol (PROVENTIL) (2.5 MG/3ML) 0.083% nebulizer solution 2.5 mg, 2.5 mg, Nebulization, Q4H  PRN, Payne, John D, PA-C   aspirin EC tablet 81 mg, 81 mg, Oral, See admin instructions, Cabbell, Kyle, MD   atorvastatin (LIPITOR) tablet 80 mg, 80 mg, Oral, QHS, Cabbell, Kyle, MD, 80 mg at 04/27/22 2210   B-complex with vitamin C tablet 1 tablet, 1 tablet, Oral, Daily, Cabbell, Kyle, MD, 1 tablet at 04/28/22 0906   bisacodyl (DULCOLAX) EC tablet 5 mg, 5 mg, Oral, Daily PRN, Cabbell, Kyle, MD, 5 mg at 04/27/22 1024   Chlorhexidine Gluconate Cloth 2 % PADS 6 each, 6 each, Topical, Q0600, Byrum, Robert S, MD, 6 each at 04/28/22 0430   cholecalciferol (VITAMIN D3) 25 MCG (1000 UNIT) tablet 1,000 Units, 1,000 Units, Oral, Daily, Cabbell, Kyle, MD, 1,000 Units at 04/28/22 0906   diazepam (VALIUM) tablet 5 mg, 5 mg, Oral, Q6H PRN, Cabbell, Kyle, MD, 5 mg at 04/26/22 0804   ferrous sulfate tablet 325 mg, 325 mg, Oral, Daily, Cabbell, Kyle, MD, 325 mg at 04/28/22 0906   finasteride (PROSCAR) tablet 5 mg, 5 mg, Oral, Daily, Cabbell, Kyle, MD, 5 mg at 04/28/22 0906   fluticasone (FLONASE) 50 MCG/ACT nasal spray 2 spray, 2 spray, Each Nare, Daily PRN, Cabbell, Kyle, MD   heparin injection 5,000 Units, 5,000 Units, Subcutaneous, Q8H, Cabbell, Kyle, MD, 5,000 Units at 04/28/22 0617   HYDROmorphone (DILAUDID) injection 1 mg, 1 mg, Intravenous, Q2H PRN, Cabbell, Kyle, MD, 1 mg at 04/24/22 2134   insulin aspart (novoLOG) injection 0-15 Units, 0-15 Units, Subcutaneous, Q4H, Cabbell, Kyle, MD, 3 Units at 04/28/22 0825   menthol-cetylpyridinium (CEPACOL) lozenge 3 mg, 1 lozenge, Oral, PRN **OR** phenol (CHLORASEPTIC) mouth spray 1 spray, 1 spray, Mouth/Throat, PRN, Cabbell, Kyle, MD   nitroGLYCERIN (NITROSTAT) SL tablet 0.4 mg, 0.4 mg, Sublingual, Q5 min PRN, Cabbell, Kyle, MD   ondansetron (ZOFRAN) tablet 4 mg, 4 mg, Oral, Q6H PRN **OR** ondansetron (ZOFRAN) injection 4 mg, 4 mg, Intravenous, Q6H PRN, Cabbell, Kyle, MD   Oral care mouth rinse, 15 mL, Mouth Rinse, PRN, Cabbell, Kyle, MD   oxyCODONE (Oxy  IR/ROXICODONE) immediate release tablet 10 mg, 10 mg, Oral, Q3H PRN, Cabbell, Kyle, MD   oxyCODONE (Oxy IR/ROXICODONE) immediate release tablet 5 mg, 5 mg, Oral, Q3H PRN, Cabbell, Kyle, MD, 5 mg at 04/27/22 1729   oxyCODONE (OXYCONTIN) 12 hr tablet 10 mg, 10 mg, Oral, Q12H, Nundkumar, Neelesh, MD, 10 mg at 04/28/22 0906   pantoprazole (PROTONIX) EC tablet 40 mg, 40 mg, Oral, Daily, Cabbell, Kyle, MD, 40 mg at 04/28/22 0906   senna-docusate (Senokot-S) tablet 2 tablet, 2 tablet, Oral, QHS PRN, Cabbell, Kyle, MD   sodium chloride flush (NS) 0.9 % injection 3 mL, 3 mL, Intravenous, Q12H, Cabbell, Kyle, MD, 3 mL at 04/28/22 0907   sodium chloride flush (NS) 0.9 % injection 3 mL, 3 mL, Intravenous, PRN, Cabbell, Kyle, MD   sodium zirconium cyclosilicate (LOKELMA)   packet 5 g, 5 g, Oral, Once, Katsadouros, Vasilios, MD   tamsulosin (FLOMAX) capsule 0.4 mg, 0.4 mg, Oral, Q12H, Cabbell, Kyle, MD, 0.4 mg at 04/28/22 0906   vitamin B-12 (CYANOCOBALAMIN) tablet 100 mcg, 100 mcg, Oral, Daily, Cabbell, Kyle, MD, 100 mcg at 04/28/22 0906   zolpidem (AMBIEN) tablet 5 mg, 5 mg, Oral, QHS PRN, Cabbell, Kyle, MD  Patients Current Diet:  Diet Order             Diet heart healthy/carb modified Room service appropriate? Yes; Fluid consistency: Thin  Diet effective now                   Precautions / Restrictions Precautions Precautions: Fall Precaution Booklet Issued: Yes (comment) Precaution Comments: pt reports multiple back surgeries, reinforced BLT Restrictions Weight Bearing Restrictions: No   Has the patient had 2 or more falls or a fall with injury in the past year? No  Prior Activity Level Community (5-7x/wk): Independent without AD prior to admission, driving  Prior Functional Level Self Care: Did the patient need help bathing, dressing, using the toilet or eating? Independent  Indoor Mobility: Did the patient need assistance with walking from room to room (with or without device)?  Independent  Stairs: Did the patient need assistance with internal or external stairs (with or without device)? Independent  Functional Cognition: Did the patient need help planning regular tasks such as shopping or remembering to take medications? Independent  Patient Information Are you of Hispanic, Latino/a,or Spanish origin?: A. No, not of Hispanic, Latino/a, or Spanish origin What is your race?: A. White Do you need or want an interpreter to communicate with a doctor or health care staff?: 0. No  Patient's Response To:  Health Literacy and Transportation Is the patient able to respond to health literacy and transportation needs?: Yes Health Literacy - How often do you need to have someone help you when you read instructions, pamphlets, or other written material from your doctor or pharmacy?: Never In the past 12 months, has lack of transportation kept you from medical appointments or from getting medications?: No In the past 12 months, has lack of transportation kept you from meetings, work, or from getting things needed for daily living?: No  Home Assistive Devices / Equipment Home Equipment: Shower seat, Grab bars - tub/shower, Adaptive equipment  Prior Device Use: Indicate devices/aids used by the patient prior to current illness, exacerbation or injury? None of the above  Current Functional Level Cognition  Overall Cognitive Status: Impaired/Different from baseline Orientation Level: Oriented X4 Safety/Judgement: Decreased awareness of safety, Decreased awareness of deficits General Comments: pt needing cues to monitor for fatigue and need to take rest break    Extremity Assessment (includes Sensation/Coordination)  Upper Extremity Assessment: Overall WFL for tasks assessed  Lower Extremity Assessment: Defer to PT evaluation RLE Deficits / Details: grossly 2+/5/ pt reports that this am he couldnt feel below the knees however after movement he cant feel below hips.  Nurse  made aware. RLE Sensation: decreased light touch, decreased proprioception LLE Deficits / Details: grossly 2+/5/ pt reports that this am he couldnt feel below the knees however after movement he cant feel below hips.  Nurse made aware. LLE Sensation: decreased light touch, decreased proprioception    ADLs       Mobility  Overal bed mobility: Needs Assistance Bed Mobility: Rolling, Sidelying to Sit Rolling: Min guard Sidelying to sit: Min assist Sit to sidelying: Mod assist General bed mobility comments: cues   for log roll technique, increased time, light min assist to raise trunk.    Transfers  Overall transfer level: Needs assistance Equipment used: Rolling walker (2 wheels) Transfers: Sit to/from Stand Sit to Stand: Min assist General transfer comment: cues for hand placement, assist to rise and steady, pt denied lightheadedness. Transferred to chair post ambulation.    Ambulation / Gait / Stairs / Wheelchair Mobility  Ambulation/Gait Ambulation/Gait assistance: Min assist, Mod assist Gait Distance (Feet): 75 Feet (x2 bouts) Assistive device: Rolling walker (2 wheels) Gait Pattern/deviations: Step-through pattern, Narrow base of support, Knees buckling General Gait Details: Slow, unsteady gait with incoordination LLE initially with knee buckling and difficulty advancing LLE; cues to widen BOS and noted to have right knee instability second half of gait needing Mod A at times for balance and Min A otherwise. Heavy reliance on UEs for support. Poor ability to self monitor for fatigue. 1 rest break. Gait velocity: decreased Gait velocity interpretation: <1.8 ft/sec, indicate of risk for recurrent falls    Posture / Balance Dynamic Sitting Balance Sitting balance - Comments: relies on UE support Balance Overall balance assessment: Needs assistance Sitting-balance support: Feet supported, Bilateral upper extremity supported Sitting balance-Leahy Scale: Good Sitting balance -  Comments: relies on UE support Standing balance support: During functional activity Standing balance-Leahy Scale: Poor Standing balance comment: reliant on B UE and mod assist for dynamic balance    Special needs/care consideration Skin Post surgical   Previous Home Environment (from acute therapy documentation) Living Arrangements: Spouse/significant other  Lives With: Spouse Available Help at Discharge: Family, Available 24 hours/day Type of Home: House Home Layout: One level Home Access: Stairs to enter Entrance Stairs-Rails: Right, Left, Can reach both Entrance Stairs-Number of Steps: 5 Bathroom Shower/Tub: Tub/shower unit Bathroom Toilet: Standard Bathroom Accessibility: Yes How Accessible: Accessible via walker  Discharge Living Setting Plans for Discharge Living Setting: Patient's home, Lives with (comment) (spouse) Type of Home at Discharge: House Discharge Home Layout: One level Discharge Home Access: Stairs to enter Entrance Stairs-Rails: Right, Left, Can reach both Entrance Stairs-Number of Steps: 5 Discharge Bathroom Shower/Tub: Tub/shower unit Discharge Bathroom Toilet: Standard Discharge Bathroom Accessibility: Yes How Accessible: Accessible via walker Does the patient have any problems obtaining your medications?: No  Social/Family/Support Systems Patient Roles: Spouse Anticipated Caregiver: wife, [name] Anticipated Caregiver's Contact Information: [phone] Caregiver Availability: 24/7 Discharge Plan Discussed with Primary Caregiver: Yes Is Caregiver In Agreement with Plan?: Yes Does Caregiver/Family have Issues with Lodging/Transportation while Pt is in Rehab?: No  Goals Patient/Family Goal for Rehab: Mod I, PT OT Expected length of stay: 7-10 days Pt/Family Agrees to Admission and willing to participate: Yes Program Orientation Provided & Reviewed with Pt/Caregiver Including Roles  & Responsibilities: Yes  Barriers to Discharge: Insurance for SNF  coverage  Decrease burden of Care through IP rehab admission: Othern/a  Possible need for SNF placement upon discharge: not aniticpated  Patient Condition: I have reviewed medical records from [facility], spoken with  TOC , and patient and spouse. I met with patient at the bedside and discussed via phone for inpatient rehabilitation assessment.  Patient will benefit from ongoing PT and OT, can actively participate in 3 hours of therapy a day 5 days of the week, and can make measurable gains during the admission.  Patient will also benefit from the coordinated team approach during an Inpatient Acute Rehabilitation admission.  The patient will receive intensive therapy as well as Rehabilitation physician, nursing, social worker, and care management interventions.  Due   to safety, skin/wound care, disease management, medication administration, pain management, and patient education the patient requires 24 hour a day rehabilitation nursing.  The patient is currently min/Mod A  with mobility and basic ADLs.  Discharge setting and therapy post discharge at home with home health is anticipated.  Patient has agreed to participate in the Acute Inpatient Rehabilitation Program and will admit today.  Preadmission Screen Completed By:  KRISTYN H CONETTA, 04/28/2022 9:25 AM ______________________________________________________________________   Discussed status with Dr. Shtrideman on 04/29/22 at 930 and received approval for admission today.  Admission Coordinator:  KRISTYN H CONETTA, time 1012/Date 04/29/22   Assessment/Plan: Diagnosis:lumbar spinal stenosis  s/p L3-S1 decompression/fusion Does the need for close, 24 hr/day Medical supervision in concert with the patient's rehab needs make it unreasonable for this patient to be served in a less intensive setting? Yes Co-Morbidities requiring supervision/potential complications: Back pain, HLD, BPH, CAD, DM type 2 Due to bladder management, bowel management,  safety, skin/wound care, disease management, medication administration, pain management, and patient education, does the patient require 24 hr/day rehab nursing? Yes Does the patient require coordinated care of a physician, rehab nurse, PT, OT, and SLP to address physical and functional deficits in the context of the above medical diagnosis(es)? Yes Addressing deficits in the following areas: balance, endurance, locomotion, strength, transferring, bowel/bladder control, bathing, dressing, feeding, grooming, toileting, and psychosocial support Can the patient actively participate in an intensive therapy program of at least 3 hrs of therapy 5 days a week? Yes The potential for patient to make measurable gains while on inpatient rehab is excellent Anticipated functional outcomes upon discharge from inpatient rehab: modified independent PT, modified independent OT, n/a SLP Estimated rehab length of stay to reach the above functional goals is: 7-10 Anticipated discharge destination: Home 10. Overall Rehab/Functional Prognosis: excellent   MD Signature: Yuri Shtridelman 

## 2022-04-29 NOTE — Discharge Summary (Signed)
Physician Discharge Summary  Patient ID: Arthur Smith MRN: [id number] DOB/AGE: 1951/01/09 71 y.o.  Admit date: 04/29/2022 Discharge date: 05/01/22  Discharge Diagnoses:  Principal Problem:   Lumbar myelopathy ([place]) Active Problems:   Acute kidney injury ([place])   Coronary artery disease involving coronary bypass graft of native heart without angina pectoris   Gastroesophageal reflux disease   Hyperkalemia   Type 2 diabetes mellitus with diabetic polyneuropathy, without long-term current use of insulin [facility])   Discharged Condition: AGAINST MEDICAL ADVICE  Significant Diagnostic Studies:  Labs:  Basic Metabolic Panel: Recent Labs  Lab 04/24/22 1410 04/24/22 1441 04/24/22 1615 04/24/22 1757 04/24/22 1927 04/24/22 2158 04/25/22 0435 04/26/22 0834 04/27/22 0420 04/28/22 0622 04/29/22 0732 04/30/22 0559  NA _0 139 139  K 3.8 3.9 3.9 3.8 4.1 4.1 4.3 4.9 5.0 5.2* 5.9* 4.5  CL  --   --   --   --   --  108 109 107 111 109 108 105  CO2  --   --   --   --   --  19* 18* 21* 18* _1 GLUCOSE  --   --   --   --   --  187* 267* 172* 167* 153* 137* 127*  BUN  --   --   --   --   --  26* 30* 41* 47* 41* 36* 27*  CREATININE  --   --   --   --   --  1.30* 1.86* 3.30* 4.38* 3.48* 2.70* 2.01*  CALCIUM  --   --   --   --   --  8.0* 7.6* 8.2* 8.1* 8.6* 9.0 9.3  MG  --   --   --   --   --  1.6*  --   --   --   --   --   --   PHOS  --   --   --   --   --  4.9*  --   --   --  3.7 4.3  --     CBC: Recent Labs  Lab 04/27/22 0420 04/28/22 0622 04/29/22 0732 04/30/22 0559  WBC 10.4 8.7 9.1 8.6  NEUTROABS 7.5 6.3  --  5.5  HGB 9.4* 9.7* 9.8* 10.4*  HCT 26.9* 27.7* 28.6* 30.2*  MCV 90.0 90.5 91.7 90.7  PLT 120* 155 192 205    CBG: Recent Labs  Lab 04/30/22 1122 04/30/22 1716 04/30/22 2131 05/01/22 0625 05/01/22 1133  GLUCAP 157* 151* 102* 121* 152*    Brief HPI:   Arthur Smith is a 71 y.o. male who who underwent redo laminectomy,  decompression and multilevel arthritic thesis of L3-S1 by Dr. Christella Noa on 04/24/2022.  He had significant acute blood loss hypotension.  He was transfused 2 units of packed cells.  He required management in the ICU with vasopressors.  He suffered acute kidney injury secondary to ATN.  Nephrology was consulted and suspected ATN as causes acute renal failure with slowly resolving.  Therapy was working with patient and CIR was recommended due to functional decline.   Hospital Course: Arthur Smith was admitted to rehab 04/29/2022 for inpatient therapies to consist of PT and OT at least three hours five days a week. Past admission physiatrist, therapy team and rehab RN have worked together to provide customized collaborative inpatient rehab.  Therapy evaluations at admission revealed patient required standby to mod assist for  basic ADL task and min assist for mobility.  His back incision was each healing without signs or symptoms of infection.  Check of labs at admission showed H&H to be stable and renal status to be slowly improving.  He was noted to have severe constipation and bowel program was being augmented.  On a.m. on 09/15 patient refused to stay in the hospital any longer and insisted on being discharged to home.  He refused to listen to wife or medical staff and was discharged [place].     Disposition: Home  Diet: Regular  Special Instructions: No driving or strenuous activity till cleared by MD. Will need to continue to work on constipation. 3.  Will need to contact neurosurgery on Monday for refills on pain medicine.   Allergies as of 05/01/2022       Reactions   Eggs Or Egg-derived Products Diarrhea, Other (See Comments)   Egg yolks cause stomach cramps and diarrhea- can tolerate if mixed in with other foods   Lactose Intolerance (gi) Diarrhea, Other (See Comments)   Causes stomach cramps and diarrhea        Medication List     STOP taking these medications     gabapentin 800 MG tablet Commonly known as: NEURONTIN Replaced by: gabapentin 300 MG capsule   metoprolol succinate 25 MG 24 hr tablet Commonly known as: TOPROL-XL   zonisamide 100 MG capsule Commonly known as: ZONEGRAN       TAKE these medications    albuterol 108 (90 Base) MCG/ACT inhaler Commonly known as: VENTOLIN HFA Inhale 2 puffs into the lungs every 6 (six) hours as needed for wheezing or shortness of breath.   aspirin EC 81 MG tablet Take 1 tablet (81 mg total) by mouth See admin instructions.   atorvastatin 80 MG tablet Commonly known as: LIPITOR Take 1 tablet (80 mg total) by mouth at bedtime.   Beano Tabs Take 1 tablet by mouth at bedtime.   blood glucose meter kit and supplies Dispense based on patient and insurance preference. Use up to four times daily as directed. (FOR ICD-10 E10.9, E11.9).   cholecalciferol 25 MCG (1000 UNIT) tablet Commonly known as: VITAMIN D3 Take 1,000 Units by mouth daily.   clopidogrel 75 MG tablet Commonly known as: PLAVIX Take 1 tablet (75 mg total) by mouth daily.   Cyanocobalamin 2500 MCG Tabs Take 2,500 mcg by mouth daily.   ferrous sulfate 325 (65 FE) MG EC tablet Take 325 mg by mouth daily with breakfast.   finasteride 5 MG tablet Commonly known as: PROSCAR Take 5 mg by mouth daily.   Fish Oil 1000 MG Caps Take 1,000 mg by mouth daily.   fluticasone 50 MCG/ACT nasal spray Commonly known as: FLONASE Place 2 sprays into both nostrils daily as needed for allergies.   gabapentin 300 MG capsule Commonly known as: NEURONTIN Take 1 capsule (300 mg total) by mouth 2 (two) times daily. Replaces: gabapentin 800 MG tablet Notes to patient: Dose decreased due to kidney failure--need to get labs checked by PCP in a week to monitor renal status and for adjustment of medications.    GARLIC PO Take 1 capsule by mouth daily.   imipramine 150 MG capsule Commonly known as: TOFRANIL-PM TAKE 1 CAPSULE BY MOUTH AT  BEDTIME.   methocarbamol 500 MG tablet Commonly known as: ROBAXIN Take 1 tablet (500 mg total) by mouth every 6 (six) hours as needed for muscle spasms.   nitroGLYCERIN 0.4 MG SL tablet  Commonly known as: NITROSTAT Place 1 tablet (0.4 mg total) under the tongue every 5 (five) minutes as needed for chest pain. DON'T take within 24 hrs of Viagra   oxyCODONE 5 MG immediate release tablet--Rx# 12 pills Commonly known as: Oxy IR/ROXICODONE Take 1 tablet (5 mg total) by mouth every 6 (six) hours as needed for moderate pain ((score 4 to 6)).   oxyCODONE 10 mg 12 hr tablet--Rx-6 pills Commonly known as: OXYCONTIN Take 1 tablet (10 mg total) by mouth every 12 (twelve) hours.   pantoprazole 40 MG tablet Commonly known as: PROTONIX Take 40 mg by mouth daily before breakfast.   polyethylene glycol 17 g packet Commonly known as: MIRALAX / GLYCOLAX Take 17 g by mouth 2 (two) times daily.   senna-docusate 8.6-50 MG tablet Commonly known as: Senokot-S Take 2 tablets by mouth at bedtime as needed (constipation).   tamsulosin 0.4 MG Caps capsule Commonly known as: FLOMAX Take 0.4 mg by mouth every 12 (twelve) hours.   Vitamin B Complex Tabs Take 1 tablet by mouth daily.        Follow-up Information     Lavone Orn, MD Follow up.   Specialty: Internal Medicine Why: Call in 1-2 days for post hospital follow up Contact information: [address], [address] [place] [address] [phone]         Ashok Pall, MD. Call on 05/04/2022.   Specialty: Neurosurgery Why: for post op appointment and pain medication refills Contact information: [address] [place] [address] [phone]         Jennye Boroughs, MD Follow up.   Specialty: Physical Medicine and Rehabilitation Why: office will call you with follow up appointment Contact information: [address] [place] [address] [phone]                  Signed: Bary Leriche 05/01/2022, 1:46 PM

## 2022-04-29 NOTE — Progress Notes (Signed)
Occupational Therapy Treatment Patient Details Name: Arthur Smith MRN: [id number] DOB: June 30, 1951 Today's Date: 04/29/2022   History of present illness Pt admittted 9/8 for  L3-S1 decompression, instrumentation interbody fusion. Post op hypotension and blood loss anemia, transferred to ICU.  PMH:  CAD, diabetes, GERD, history of significant spinal stenosis and prior surgeries.  He has lower extremity claudication and paresthesias, some associated urinary retention.   OT comments  Pt currently mod assist for LB selfcare sit to stand and for toilet transfers and toileting tasks.  Able to state 2/3 back precautions.  Recommend continued acute care OT to help progress to supervision level with ADLs.  Feel he will benefit from AIR level of therapy prior to discharge home to reach this.       Recommendations for follow up therapy are one component of a multi-disciplinary discharge planning process, led by the attending physician.  Recommendations may be updated based on patient status, additional functional criteria and insurance authorization.    Follow Up Recommendations  Acute inpatient rehab (3hours/day)    Assistance Recommended at Discharge Frequent or constant Supervision/Assistance  Patient can return home with the following  A lot of help with walking and/or transfers;A lot of help with bathing/dressing/bathroom;Assistance with cooking/housework;Assist for transportation;Help with stairs or ramp for entrance   Equipment Recommendations  Other (comment) (TBD next venue of care)       Precautions / Restrictions Precautions Precautions: Fall Precaution Comments: able to recall 2/3 precautions Restrictions Weight Bearing Restrictions: No       Mobility Bed Mobility                    Transfers Overall transfer level: Needs assistance   Transfers: Sit to/from Stand Sit to Stand: Mod assist           General transfer comment: Mod assist with mod instructional  cueing to rely more on his UEs to push up from the arms of the chair and to reach back and avoid excessive trunk flexion.     Balance Overall balance assessment: Needs assistance Sitting-balance support: Feet supported Sitting balance-Leahy Scale: Good     Standing balance support: During functional activity, Reliant on assistive device for balance Standing balance-Leahy Scale: Poor Standing balance comment: Needs use of the RW for support when standing.                           ADL either performed or assessed with clinical judgement   ADL Overall ADL's : Needs assistance/impaired     Grooming: Wash/dry hands;Minimal assistance;Standing               Lower Body Dressing: Moderate assistance;Sit to/from stand;Adhering to back precautions Lower Body Dressing Details (indicate cue type and reason): with use of the sockaide for donning gripper socks.  He was able to cross the LLEover the right knee and donn his gripper socks but unable to do the opposite. Toilet Transfer: Moderate assistance;Comfort height toilet;Ambulation;Grab bars;Rolling walker (2 wheels)   Toileting- Clothing Manipulation and Hygiene: Moderate assistance;Sit to/from stand       Functional mobility during ADLs: Moderate assistance;Rolling walker (2 wheels) General ADL Comments: Pt educated on reacher, sockaide, LH sponge, and LH shoe horn for use with LB selfcare.  Needs continued practice for proficiency as he only used the sockaide in session.  Mod assist for sit to stand from the recliner and the elevated toilet.  Will need 3:1 for home.  Cognition Arousal/Alertness: Awake/alert Behavior During Therapy: WFL for tasks assessed/performed Overall Cognitive Status: Impaired/Different from baseline Area of Impairment: Memory, Awareness                     Memory: Decreased recall of precautions, Decreased short-term memory   Safety/Judgement: Decreased awareness of  safety     General Comments: Pt able to state 2/3 precautions.  Needed cueing for following precautions with sit to stand secondary to too much trunk flexion.                   Pertinent Vitals/ Pain       Pain Assessment Pain Assessment: Faces Faces Pain Scale: Hurts a little bit Pain Location: back Pain Descriptors / Indicators: Discomfort Pain Intervention(s): Limited activity within patient's tolerance, Repositioned         Frequency  Min 2X/week        Progress Toward Goals  OT Goals(current goals can now be found in the care plan section)  Progress towards OT goals: Progressing toward goals  Acute Rehab OT Goals OT Goal Formulation: With patient Time For Goal Achievement: 05/11/22 Potential to Achieve Goals: Good  Plan Discharge plan remains appropriate       AM-PAC OT "6 Clicks" Daily Activity     Outcome Measure   Help from another person eating meals?: None Help from another person taking care of personal grooming?: A Little Help from another person toileting, which includes using toliet, bedpan, or urinal?: A Lot Help from another person bathing (including washing, rinsing, drying)?: A Lot Help from another person to put on and taking off regular upper body clothing?: A Little Help from another person to put on and taking off regular lower body clothing?: A Lot 6 Click Score: 16    End of Session Equipment Utilized During Treatment: Rolling walker (2 wheels);Gait belt  OT Visit Diagnosis: Unsteadiness on feet (R26.81);Other abnormalities of gait and mobility (R26.89);Muscle weakness (generalized) (M62.81);Pain Pain - part of body:  (back)   Activity Tolerance Patient tolerated treatment well   Patient Left in chair;with call bell/phone within reach   Nurse Communication Mobility status        Time: 2947-6546 OT Time Calculation (min): 36 min  Charges: OT General Charges $OT Visit: 1 Visit OT Treatments $Self Care/Home Management :  23-37 mins  Cameran Ahmed OTR/L 04/29/2022, 2:29 PM

## 2022-04-29 NOTE — Progress Notes (Signed)
Patient ID: Arthur Smith, male   DOB: [birth year], 71 y.o.   MRN: [id number]   Pt arrived to 4W19 per bed. Pt oriented to rehab and policies reviewed, pt in agreement. Assessment complete, vitals obtained. Call light in reach, bed in low position. No complications noted. Mylo Red, LPN

## 2022-04-29 NOTE — H&P (Addendum)
Physical Medicine and Rehabilitation Admission H&P     CC: Functional deficits secondary to lumbar myelopathy   HPI: Arthur Smith is a 71 year old male with history of CAD, HLD, HTN, polyneuropathy, BPH, DM type 2, lumbar and thoracic stenosis who underwent redo laminectomy, decompression, and multilevel arthrodesis L3-S1 by Dr. Christella Noa on 04/24/2022.  He has a history of 2 prior L spine surgeries and a thoracic surgery previously by Dr. Vertell Limber.  Prior to his surgery he was having pain in his legs and back that was worse when standing and walking. His surgery was complicated by acute blood loss resulting in hypotension. He was transfused 2 units of PRBCs on 9/10.  He required CCM management with vasopressors. He suffered AKI secondary to ATN. Nephrology was consulted and his was given IVF. His kidneys have recovered with excellent urine output. He was also lethargic with acute encephalopathy secondary to oversedation in setting of AKI. Serum creatinine has trended downward over the last two days. He is moving all extremities well and incision is healing without signs of infection. He is tolerating a heart healthy diet and voiding spontaneously.  He has a history of CAD and is without current complaints. The patient requires inpatient physical medicine and rehabilitation evaluations and treatment secondary to dysfunction due to lumbar myelopathy   Review of Systems  Constitutional:  Positive for chills. Negative for fever.  Eyes:  Negative for blurred vision and double vision.  Respiratory:  Positive for sputum production. Negative for cough and wheezing.   Cardiovascular:  Negative for chest pain and palpitations.  Gastrointestinal:  Positive for nausea. No abdominal pain. Negative for vomiting.  Genitourinary:  Positive for dysuria and urgency.  Musculoskeletal:  Positive for back pain.       Incisional pain with standing  Neurological:  Negative for dizziness and headaches.   Psychiatric/Behavioral:  The patient does not have insomnia.         Past Medical History:  Diagnosis Date   Arthritis     BPH without obstruction/lower urinary tract symptoms     Bronchitis with asthma, acute 11/2011   CAD S/P percutaneous coronary angioplasty 11/15/2018    Severe 2 vessel obstructive CAD:. Ost-pCx 95% (DES PCI  STENT SYNERGY DES 3.5X28) &p-mCx 100% (DES PCI STENT SYNERGY DES 2.25X32). pRCA 90%, mRCA 85% & rPDA 90%.dLAD ~40%.  EF 55 to 60%.  Moderately elevated LVEDP.  No R WMA.  Plan staged PCI to 3 RCA-PDA lesions 4/1   Depression      denies   Diabetes mellitus without complication (HCC)      Type II- Diet controlled   Frequency of urination      URINATION AT NIGHT, PAINFUL SLOW STREAM    GERD (gastroesophageal reflux disease)      occ   Hyperlipidemia     Neuromuscular disorder (HCC)      DIZZINESS   PERIPHERAL NEUROPATHY    Neuropathy     No pertinent past medical history      RINGING RIGHT EAR   NSTEMI (non-ST elevated myocardial infarction) ([place]) 11/13/2018   Recurrent upper respiratory infection (URI)      SOB CURRENT COLD   Shortness of breath      d/t "cold" 12/02/11         Past Surgical History:  Procedure Laterality Date   BACK SURGERY       CARDIAC CATHETERIZATION       CORONARY STENT INTERVENTION N/A 11/15/2018  Procedure: CORONARY STENT INTERVENTION;  Surgeon: Martinique, Peter M, MD;  Location: [facility] INVASIVE CV LAB;;  Ost-pCx 95% (DES PCI  STENT SYNERGY DES 3.5X28) &p-mCx 100% (DES PCI STENT SYNERGY DES 2.25X32)   CORONARY STENT INTERVENTION N/A 11/16/2018    Procedure: CORONARY STENT INTERVENTION;  Surgeon: Belva Crome, MD;  Location: [facility] LAB;  Service: Cardiovascular;  Laterality: N/A;   FINGER SURGERY        RIGHT MIDDLE FINGER   HERNIA REPAIR        UMBILICAL HERNIA [id number]   LEFT HEART CATH AND CORONARY ANGIOGRAPHY N/A 11/15/2018    Procedure: LEFT HEART CATH AND CORONARY ANGIOGRAPHY;  Surgeon: Martinique, Peter M, MD;  Location:  [facility] INVASIVE CV LAB;; Severe 2 vessel obstructive CAD:. Ost-pCx 95% (DES PCI) &p-mCx 100% (DES PCI). pRCA 90%, mRCA 85% & rPDA 90%.dLAD ~40%.  EF 55 to 60%.  Moderately elevated LVEDP.  No R WMA.  -- Plan staged PCI to 3 RCA-PDA lesions 4/1   LUMBAR LAMINECTOMY/DECOMPRESSION MICRODISCECTOMY   12/22/2011    Procedure: LUMBAR LAMINECTOMY/DECOMPRESSION MICRODISCECTOMY 1 LEVEL;  Surgeon: Erline Levine, MD;  Location: [facility]UR[facility];  Service: Neurosurgery;  Laterality: N/A;  Thoracic ten-eleven laminectomy   LUMBAR LAMINECTOMY/DECOMPRESSION MICRODISCECTOMY Right 05/01/2014    Procedure: Right Lumbar three-four Microdiskectomy;  Surgeon: Erline Levine, MD;  Location: [facility];  Service: Neurosurgery;  Laterality: Right;   LUMBAR LAMINECTOMY/DECOMPRESSION MICRODISCECTOMY Right 11/08/2014    Procedure: Right L4-5 L5-S1 Laminectomy;  Surgeon: Erline Levine, MD;  Location: [facility] NEURO ORS;  Service: Neurosurgery;  Laterality: Right;  Right L4-5 L5-S1 Laminectomy   MULTIPLE TOOTH EXTRACTIONS             Family History  Problem Relation Age of Onset   Cancer Mother     Stroke Father     Neuropathy Brother      Social History:  reports that he quit smoking about 38 years ago. His smoking use included cigarettes. He has a 30.00 pack-year smoking history. He has never used smokeless tobacco. He reports current alcohol use. He reports that he does not use drugs. Allergies:       Allergies  Allergen Reactions   Eggs Or Egg-Derived Products Diarrhea      Egg yolks cause stomach cramps and diarrhea   Lactose Intolerance (Gi) Diarrhea      Causes stomach cramps and diarrhea          Medications Prior to Admission  Medication Sig Dispense Refill   Alpha-D-Galactosidase (BEANO) TABS Take 1 tablet by mouth daily as needed (flatulence).       aspirin EC 81 MG tablet Take 1 tablet (81 mg total) by mouth See admin instructions.       atorvastatin (LIPITOR) 80 MG tablet Take 1 tablet (80 mg total) by mouth at  bedtime. 90 tablet 3   B Complex Vitamins (VITAMIN B COMPLEX) TABS Take 1 tablet by mouth daily.       cholecalciferol (VITAMIN D3) 25 MCG (1000 UT) tablet Take 1,000 Units by mouth daily.       clopidogrel (PLAVIX) 75 MG tablet Take 1 tablet (75 mg total) by mouth daily. 90 tablet 3   Cyanocobalamin 2500 MCG TABS Take 2,500 mcg by mouth daily.       ferrous sulfate 325 (65 FE) MG EC tablet Take 325 mg by mouth daily.       finasteride (PROSCAR) 5 MG tablet Take 5 mg by mouth daily.  fluticasone (FLONASE) 50 MCG/ACT nasal spray Place 2 sprays into both nostrils daily as needed for allergies.       gabapentin (NEURONTIN) 800 MG tablet Take 1 tablet (800 mg total) by mouth 4 (four) times daily. 401 tablet 1   GARLIC PO Take 1 capsule by mouth daily.       imipramine (TOFRANIL-PM) 150 MG capsule TAKE 1 CAPSULE BY MOUTH AT BEDTIME. 90 capsule 3   metoprolol succinate (TOPROL-XL) 25 MG 24 hr tablet TAKE 1 TABLET (25 MG TOTAL) BY MOUTH DAILY. 90 tablet 3   Omega-3 Fatty Acids (FISH OIL) 1000 MG CAPS Take 1,000 mg by mouth daily.       pantoprazole (PROTONIX) 40 MG tablet Take 40 mg by mouth daily.       senna-docusate (SENOKOT-S) 8.6-50 MG tablet Take 2 tablets by mouth at bedtime as needed (constipation).        tamsulosin (FLOMAX) 0.4 MG CAPS capsule Take 0.4 mg by mouth every 12 (twelve) hours.       zonisamide (ZONEGRAN) 100 MG capsule TAKE 3 AT NIGHT (Patient taking differently: Take 300 mg by mouth daily at 6 PM.) 270 capsule 0   albuterol (VENTOLIN HFA) 108 (90 Base) MCG/ACT inhaler Inhale 2 puffs into the lungs every 6 (six) hours as needed for wheezing or shortness of breath.       blood glucose meter kit and supplies Dispense based on patient and insurance preference. Use up to four times daily as directed. (FOR ICD-10 E10.9, E11.9). 1 each 0   nitroGLYCERIN (NITROSTAT) 0.4 MG SL tablet Place 1 tablet (0.4 mg total) under the tongue every 5 (five) minutes as needed for chest pain. DON'T  take within 24 hrs of Viagra 25 tablet 6          Home: Home Living Family/patient expects to be discharged to:: Private residence Living Arrangements: Spouse/significant other Available Help at Discharge: Family, Available 24 hours/day Type of Home: House Home Access: Stairs to enter [employer] of Steps: 5 Entrance Stairs-Rails: Right, Left, Can reach both Home Layout: One level Bathroom Shower/Tub: Chiropodist: Standard Bathroom Accessibility: Yes Home Equipment: Shower seat, Grab bars - tub/shower, Radiation protection practitioner Equipment: Reacher, Long-handled shoe horn  Lives With: Spouse   Functional History: Prior Function Prior Level of Function : Independent/Modified Independent, Driving ADLs Comments: Independent   Functional Status:  Mobility: Bed Mobility Overal bed mobility: Needs Assistance Bed Mobility: Rolling, Sidelying to Sit Rolling: Min guard Sidelying to sit: Min assist, HOB elevated Sit to sidelying: Mod assist General bed mobility comments: Min guard assist with cues to log roll to R using bed rail. MinA to elevate trunk to sit up EOB, cuing pt to bring elbow under him to push up off the bed. Transfers Overall transfer level: Needs assistance Equipment used: Rolling walker (2 wheels) Transfers: Sit to/from Stand Sit to Stand: Mod assist General transfer comment: ModA to power up to stand from EOB, cuing pt to push up from bed, good initiation but got stuck after lifting buttocks a few inches off the bed surface. MinA to practice x5 mini sit <> stand transfers from recliner with hands on arm rest and just coming to partial stand (as far as able with hands still on arm rests). Ambulation/Gait Ambulation/Gait assistance: Min assist Gait Distance (Feet): 115 Feet Assistive device: Rolling walker (2 wheels) Gait Pattern/deviations: Step-through pattern, Trunk flexed, Knees buckling General Gait Details: Pt with slow gait  and flexed posture. Repeated cues  provided to let shoulders rest and descend and adduct scapulas, but min momentary success. Pt with slight knee buckling, which increased in intensity as distance progressed. Pt needing repeated cues to push through RW and extend knees, along with minA, for stability throughout. Gait velocity: reduced Gait velocity interpretation: <1.31 ft/sec, indicative of household ambulator   ADL:   Cognition: Cognition Overall Cognitive Status: Impaired/Different from baseline Orientation Level: Oriented X4 Cognition Arousal/Alertness: Awake/alert Behavior During Therapy: WFL for tasks assessed/performed Overall Cognitive Status: Impaired/Different from baseline Area of Impairment: Memory, Safety/judgement, Problem solving Memory: Decreased short-term memory, Decreased recall of precautions Safety/Judgement: Decreased awareness of safety, Decreased awareness of deficits Problem Solving: Slow processing, Difficulty sequencing, Requires verbal cues General Comments: Pt needs prompting to recall spinal precautions. Upon entry, pt was having difficulty managing his phone, repeatedly getting on game apps when he states he is trying to figure out how to call his wife. PT assisted him. Needs cues to sequence mobility while maintaining spinal precautions.   Physical Exam: Blood pressure (!) 118/56, pulse 92, temperature 98.5 F (36.9 C), temperature source Oral, resp. rate 16, height 5' 8" (1.727 m), weight 87.3 kg, SpO2 100 %. Physical Exam   General: Alert and oriented x 4, No apparent distress, laying in bed and appears comfortable HEENT: Head is normocephalic, atraumatic, PERRLA, EOMI, sclera anicteric, oral mucosa pink and moist, wearing dentures and glasses Neck: Supple without JVD or lymphadenopathy Heart: Reg rate and rhythm. No murmurs rubs or gallops Chest: CTA bilaterally without wheezes, rales, or rhonchi; no distress Abdomen: Soft, non-tender, non-distended,  bowel sounds positive. Extremities: No clubbing, cyanosis, or edema. Pulses are 2+ Psych: Pt's affect is appropriate. Pt is cooperative, very pleasant Skin: Clean and intact without signs of breakdown, tatoos b/l arms Dressing in place on R great toe from small wound-CDI Honeycomb dressing lumbar incision with mild drainage noted Neuro:  CN 2-12 intact, follows commands, hard of hearing with decreased hearing of finger rub on right, good insight and judgement Decreased sensation to LT in stocking glove distribution b/l LE Strength 5/5 in b/l LE Strength 4/5 b/l LE throughout FTN intact b/l Musculoskeletal: chronic R distal 3rd digit amputation, full ROM, normal tone, No joint swelling noted   Lab Results Last 48 Hours        Results for orders placed or performed during the hospital encounter of 04/24/22 (from the past 48 hour(s))  Glucose, capillary     Status: Abnormal    Collection Time: 04/27/22 11:41 AM  Result Value Ref Range    Glucose-Capillary 132 (H) 70 - 99 mg/dL      Comment: Glucose reference range applies only to samples taken after fasting for at least 8 hours.    Comment 1 Notify RN      Comment 2 Document in Chart    Glucose, capillary     Status: None    Collection Time: 04/27/22  3:47 PM  Result Value Ref Range    Glucose-Capillary 97 70 - 99 mg/dL      Comment: Glucose reference range applies only to samples taken after fasting for at least 8 hours.    Comment 1 Notify RN      Comment 2 Document in Chart    Glucose, capillary     Status: Abnormal    Collection Time: 04/27/22  7:10 PM  Result Value Ref Range    Glucose-Capillary 123 (H) 70 - 99 mg/dL      Comment: Glucose reference range applies only  to samples taken after fasting for at least 8 hours.  Glucose, capillary     Status: Abnormal    Collection Time: 04/27/22 11:41 PM  Result Value Ref Range    Glucose-Capillary 165 (H) 70 - 99 mg/dL      Comment: Glucose reference range applies only to samples  taken after fasting for at least 8 hours.  Glucose, capillary     Status: Abnormal    Collection Time: 04/28/22  3:33 AM  Result Value Ref Range    Glucose-Capillary 134 (H) 70 - 99 mg/dL      Comment: Glucose reference range applies only to samples taken after fasting for at least 8 hours.  CBC with Differential/Platelet     Status: Abnormal    Collection Time: 04/28/22  6:22 AM  Result Value Ref Range    WBC 8.7 4.0 - 10.5 K/uL    RBC 3.06 (L) 4.22 - 5.81 MIL/uL    Hemoglobin 9.7 (L) 13.0 - 17.0 g/dL    HCT 27.7 (L) 39.0 - 52.0 %    MCV 90.5 80.0 - 100.0 fL    MCH 31.7 26.0 - 34.0 pg    MCHC 35.0 30.0 - 36.0 g/dL    RDW 14.8 11.5 - 15.5 %    Platelets 155 150 - 400 K/uL    nRBC 0.0 0.0 - 0.2 %    Neutrophils Relative % 74 %    Neutro Abs 6.3 1.7 - 7.7 K/uL    Lymphocytes Relative 12 %    Lymphs Abs 1.1 0.7 - 4.0 K/uL    Monocytes Relative 11 %    Monocytes Absolute 1.0 0.1 - 1.0 K/uL    Eosinophils Relative 2 %    Eosinophils Absolute 0.2 0.0 - 0.5 K/uL    Basophils Relative 0 %    Basophils Absolute 0.0 0.0 - 0.1 K/uL    Immature Granulocytes 1 %    Abs Immature Granulocytes 0.07 0.00 - 0.07 K/uL      Comment: Performed at [facility], [address] [place], [place] [address]  Renal function panel     Status: Abnormal    Collection Time: 04/28/22  6:22 AM  Result Value Ref Range    Sodium 139 135 - 145 mmol/L    Potassium 5.2 (H) 3.5 - 5.1 mmol/L    Chloride 109 98 - 111 mmol/L    CO2 25 22 - 32 mmol/L    Glucose, Bld 153 (H) 70 - 99 mg/dL      Comment: Glucose reference range applies only to samples taken after fasting for at least 8 hours.    BUN 41 (H) 8 - 23 mg/dL    Creatinine, Ser 3.48 (H) 0.61 - 1.24 mg/dL    Calcium 8.6 (L) 8.9 - 10.3 mg/dL    Phosphorus 3.7 2.5 - 4.6 mg/dL    Albumin 2.4 (L) 3.5 - 5.0 g/dL    GFR, Estimated 18 (L) >60 mL/min      Comment: (NOTE) Calculated using the CKD-EPI Creatinine Equation (2021)      Anion gap 5 5 - 15       Comment: Performed at [facility] [address], [place], [place] [address]  Glucose, capillary     Status: Abnormal    Collection Time: 04/28/22  8:06 AM  Result Value Ref Range    Glucose-Capillary 182 (H) 70 - 99 mg/dL      Comment: Glucose reference range applies only to  samples taken after fasting for at least 8 hours.  Glucose, capillary     Status: Abnormal    Collection Time: 04/28/22 11:23 AM  Result Value Ref Range    Glucose-Capillary 148 (H) 70 - 99 mg/dL      Comment: Glucose reference range applies only to samples taken after fasting for at least 8 hours.  Glucose, capillary     Status: Abnormal    Collection Time: 04/28/22  3:38 PM  Result Value Ref Range    Glucose-Capillary 143 (H) 70 - 99 mg/dL      Comment: Glucose reference range applies only to samples taken after fasting for at least 8 hours.  Glucose, capillary     Status: Abnormal    Collection Time: 04/28/22  7:06 PM  Result Value Ref Range    Glucose-Capillary 135 (H) 70 - 99 mg/dL      Comment: Glucose reference range applies only to samples taken after fasting for at least 8 hours.  Glucose, capillary     Status: Abnormal    Collection Time: 04/29/22 12:53 AM  Result Value Ref Range    Glucose-Capillary 157 (H) 70 - 99 mg/dL      Comment: Glucose reference range applies only to samples taken after fasting for at least 8 hours.  Glucose, capillary     Status: Abnormal    Collection Time: 04/29/22  3:17 AM  Result Value Ref Range    Glucose-Capillary 135 (H) 70 - 99 mg/dL      Comment: Glucose reference range applies only to samples taken after fasting for at least 8 hours.  Renal function panel     Status: Abnormal    Collection Time: 04/29/22  7:32 AM  Result Value Ref Range    Sodium 139 135 - 145 mmol/L    Potassium 5.9 (H) 3.5 - 5.1 mmol/L    Chloride 108 98 - 111 mmol/L    CO2 26 22 - 32 mmol/L    Glucose, Bld 137 (H) 70 - 99 mg/dL      Comment: Glucose reference range applies only to  samples taken after fasting for at least 8 hours.    BUN 36 (H) 8 - 23 mg/dL    Creatinine, Ser 2.70 (H) 0.61 - 1.24 mg/dL    Calcium 9.0 8.9 - 10.3 mg/dL    Phosphorus 4.3 2.5 - 4.6 mg/dL    Albumin 2.5 (L) 3.5 - 5.0 g/dL    GFR, Estimated 25 (L) >60 mL/min      Comment: (NOTE) Calculated using the CKD-EPI Creatinine Equation (2021)      Anion gap 5 5 - 15      Comment: Performed at [facility] [address], [place], [place] [address]  CBC     Status: Abnormal    Collection Time: 04/29/22  7:32 AM  Result Value Ref Range    WBC 9.1 4.0 - 10.5 K/uL    RBC 3.12 (L) 4.22 - 5.81 MIL/uL    Hemoglobin 9.8 (L) 13.0 - 17.0 g/dL    HCT 28.6 (L) 39.0 - 52.0 %    MCV 91.7 80.0 - 100.0 fL    MCH 31.4 26.0 - 34.0 pg    MCHC 34.3 30.0 - 36.0 g/dL    RDW 14.5 11.5 - 15.5 %    Platelets 192 150 - 400 K/uL    nRBC 0.0 0.0 - 0.2 %      Comment: Performed at [facility],  [address] [place], [place] [address]  Glucose, capillary     Status: Abnormal    Collection Time: 04/29/22  8:01 AM  Result Value Ref Range    Glucose-Capillary 154 (H) 70 - 99 mg/dL      Comment: Glucose reference range applies only to samples taken after fasting for at least 8 hours.       Imaging Results (Last 48 hours)  US RENAL   Result Date: 04/27/2022 CLINICAL DATA:  Acute kidney injury EXAM: RENAL / URINARY TRACT ULTRASOUND COMPLETE COMPARISON:  None Available. FINDINGS: Right Kidney: Renal measurements: 13.7 x 6.2 x 6.0 cm = volume: 265 mL. Echogenicity within normal limits. No mass or hydronephrosis visualized. Left Kidney: Renal measurements: 12.1 x 6.3 x 6.5 cm = volume: 259 mL. Echogenicity within normal limits. No mass or hydronephrosis visualized. Bladder: Appears normal for degree of bladder distention. Other: Prostate enlargement. IMPRESSION: No acute findings.  No hydronephrosis. Prostate enlargement. Electronically Signed   By: Rolm Baptise M.D.   On: 04/27/2022 21:08          Blood  pressure (!) 118/56, pulse 92, temperature 98.5 F (36.9 C), temperature source Oral, resp. rate 16, height 5' 8" (1.727 m), weight 87.3 kg, SpO2 100 %.   Medical Problem List and Plan: 1. Functional deficits secondary to lumbar myelopathy s/p interbody fusion 9/8 Dr. Christella Noa             -patient may shower with incisions covered             -ELOS/Goals: 7-10 days Mod I with PT/OT  -Admit to CIR 2.  Antithrombotics: -DVT/anticoagulation:  Pharmaceutical: Heparin             -antiplatelet therapy: aspirin 81 mg daily, plavix 3. Pain Management: oxycodone 10 mg q 12 hours; Tylenol, oxycodone as needed  -At home he was using gabapentin, imipramine and Zonegran for peripheral neuropathy related pain 4. Mood/Behavior/Sleep: LCSW to evaluate and provide emotional support             -antipsychotic agents: n/a             -depression>>continue imipramine 5. Neuropsych/cognition: This patient is capable of making decisions on his own behalf. 6. Skin/Wound Care: routine skin care checks             -monitor surgical incision 7. Fluids/Electrolytes/Nutrition: strict Is and Os and follow-up chemistries             -heart healthy/carb modified diet/thins 8:CAD s/p DES 2020: stable without report of symptoms peri-op             -continue aspirin and statin;  restart Plavix             -primary cardiologist Dr. Sallyanne Kuster 9: AKI: improving; follow-up BMP  -Per nephrology avoid hypovolemia, hypotension, nephrotoxins. Also avoid morphine and  baclofen.  -avoid nephrotoxic meds 10: Acute encephalopathy resolved: avoid sedating meds 11: Hyperlipidemia: continue Lipitor 80 mg daily 12: Hypertension: home metoprolol being held 13: BPH: continue finasteride 5 mg and tamsulosin 0.4 mg daily             -check PVRs 14: Acute blood loss anemia: H and H trending upward; follow-up CBC             -continue ferrous sulfate supplementation 15: Hyperkalemia: repeat lab tomorrow 16: DM: continue carb modified  diet; A1c = 5.9; continue CBGs, overall well controlled             -  no home meds, continue SSI 17. GERD continue PPI   I have personally performed a face to face diagnostic evaluation of this patient and formulated the key components of the plan.  Additionally, I have personally reviewed laboratory data, imaging studies, as well as relevant notes and concur with the physician assistant's documentation above.  The patient's status has not changed from the original H&P.  Any changes in documentation from the acute care chart have been noted above.  Jennye Boroughs, MD, FAAPMR  Barbie Banner, PA-C 04/29/2022

## 2022-04-29 NOTE — Care Management Important Message (Signed)
Important Message  Patient Details  Name: DANUEL FELICETTI MRN: [id number] Date of Birth: [birth year]   Medicare Important Message Given:  Yes     Sherilyn Banker 04/29/2022, 2:10 PM

## 2022-04-29 NOTE — Progress Notes (Signed)
[facility] Progress Note   Subjective:   Seen in room - no family present this AM.  Feeling fine.  UOP 4.5L yesterday.  No N/V/D, no F/C/dyspnea.  For CIR today.   Objective Vitals:   04/28/22 2000 04/28/22 2200 04/29/22 0315 04/29/22 0802  BP: 132/81 131/72 127/75 (!) 118/56  Pulse: 91 90 81 92  Resp: 16 17 (!) 21 16  Temp:  98.1 F (36.7 C) 98 F (36.7 C) 98.5 F (36.9 C)  TempSrc:  Oral Oral Oral  SpO2: 94% 96% 95% 100%  Weight:      Height:       Physical Exam Gen: sitting in bed appearing well  Eyes: anicteric, EOMI ENT: MMM Neck: supple CV:  RRR, no rub Abd: soft, mildly distended + BS Back: clear on deep inspiration GU: condom cath, bladder not palpable Extr:  no edema Neuro: grossly nonfocal Skin: no rashes  Additional Objective Labs: Basic Metabolic Panel: Recent Labs  Lab 04/24/22 2158 04/25/22 0435 04/27/22 0420 04/28/22 0622 04/29/22 0732  NA 139   < > 137 139 139  K 4.1   < > 5.0 5.2* 5.9*  CL 108   < > 111 109 108  CO2 19*   < > 18* 25 26  GLUCOSE 187*   < > 167* 153* 137*  BUN 26*   < > 47* 41* 36*  CREATININE 1.30*   < > 4.38* 3.48* 2.70*  CALCIUM 8.0*   < > 8.1* 8.6* 9.0  PHOS 4.9*  --   --  3.7 4.3   < > = values in this interval not displayed.    Liver Function Tests: Recent Labs  Lab 04/28/22 0622 04/29/22 0732  ALBUMIN 2.4* 2.5*    No results for input(s): "LIPASE", "AMYLASE" in the last 168 hours. CBC: Recent Labs  Lab 04/25/22 0435 04/26/22 0834 04/26/22 1849 04/27/22 0420 04/28/22 0622 04/29/22 0732  WBC 9.9 9.9  --  10.4 8.7 9.1  NEUTROABS 7.6 7.0  --  7.5 6.3  --   HGB 8.5* 7.8*   < > 9.4* 9.7* 9.8*  HCT 25.0* 23.4*   < > 26.9* 27.7* 28.6*  MCV 92.9 95.5  --  90.0 90.5 91.7  PLT 112* 114*  --  120* 155 192   < > = values in this interval not displayed.    Blood Culture    Component Value Date/Time   SDES URINE, CLEAN CATCH 04/15/2014 1534   SPECREQUEST NONE 04/15/2014 1534   CULT NO  GROWTH Performed at [facility] 04/15/2014 1534   REPTSTATUS 04/17/2014 FINAL 04/15/2014 1534    Cardiac Enzymes: No results for input(s): "CKTOTAL", "CKMB", "CKMBINDEX", "TROPONINI" in the last 168 hours. CBG: Recent Labs  Lab 04/28/22 1538 04/28/22 1906 04/29/22 0053 04/29/22 0317 04/29/22 0801  GLUCAP 143* 135* 157* 135* 154*    Iron Studies: No results for input(s): "IRON", "TIBC", "TRANSFERRIN", "FERRITIN" in the last 72 hours. @lablastinr3 @ Studies/Results: RENAL  Result Date: 04/27/2022 CLINICAL DATA:  Acute kidney injury EXAM: RENAL / URINARY TRACT ULTRASOUND COMPLETE COMPARISON:  None Available. FINDINGS: Right Kidney: Renal measurements: 13.7 x 6.2 x 6.0 cm = volume: 265 mL. Echogenicity within normal limits. No mass or hydronephrosis visualized. Left Kidney: Renal measurements: 12.1 x 6.3 x 6.5 cm = volume: 259 mL. Echogenicity within normal limits. No mass or hydronephrosis visualized. Bladder: Appears normal for degree of bladder distention. Other: Prostate enlargement. IMPRESSION: No acute findings.  No hydronephrosis. Prostate enlargement.  Electronically Signed   By: Charlett Nose M.D.   On: 04/27/2022 21:08   Medications:  sodium chloride Stopped (04/25/22 2008)   sodium chloride      aspirin EC  81 mg Oral See admin instructions   atorvastatin  80 mg Oral QHS   B-complex with vitamin C  1 tablet Oral Daily   Chlorhexidine Gluconate Cloth  6 each Topical Q0600   cholecalciferol  1,000 Units Oral Daily   ferrous sulfate  325 mg Oral Daily   finasteride  5 mg Oral Daily   heparin injection (subcutaneous)  5,000 Units Subcutaneous Q8H   insulin aspart  0-15 Units Subcutaneous Q4H   oxyCODONE  10 mg Oral Q12H   pantoprazole  40 mg Oral Daily   sodium chloride flush  3 mL Intravenous Q12H   tamsulosin  0.4 mg Oral Q12H   vitamin B-12  100 mcg Oral Daily    Assessment/Plan: Arthur Smith is an 71 y.o. male with BPH f/b Dr. Liliane Shi, HL, GERD, CAD, diet  controlled DM, lumbar spinal stenosis s/p lumbar fusion 9/8 who is seen for AKI.   **AKI, severe, nonoliguric:  Pt with normal baseline renal function seen for severe but nonoliguric AKI s/p intraoperative blood loss and hypotension.  Renal US nromal. Suspect ATN. UA still hasn't been sent.  Non oliguric status portends better prognosis and in fact kidney function has already substantially improved.   Volume looks ok - the polyuria is probably just a post ATN diuresis - no need for IVF unless unable to take PO. Avoid hypovolemia, hypotension, nephrotoxins.  Avoid use of morphine, baclofen.  Dose meds for crcl.   Expect he'll return to normal baseline renal function.     **ABLA: transfuse per primary for Hb < 7.    **Hypotension:  presumed hypovolemia/blood loss.  Off pressors now.  Home metoprolol on hold.    **BPH: per above renal US ok no obstruction; on home finasteride and tamsulosin.    **GERD: on PPI, ok to continue   **Spinal stenosis s/p fusion:  plans for CIR today   Nothing further to add, will sign off.  Can f/u with PCP and if kidney function doesn't return to normal (I think it will) we'd be happy to see him in clinic.    Estill Bakes MD 04/29/2022, 11:11 AM  [facility] Pager: [phone]

## 2022-04-29 NOTE — Discharge Instructions (Signed)

## 2022-04-29 NOTE — Progress Notes (Signed)
Inpatient Rehab Admissions Coordinator:  ° °I have a CIR bed for this Pt. Today. RN may call report to [phone]. ° °Andrey Mccaskill, MS, CCC-SLP °Rehab Admissions Coordinator  °[phone] (celll) °[phone] (office) ° °

## 2022-04-29 NOTE — Discharge Summary (Signed)
BP 128/79   Pulse 85   Temp 98.5 F (36.9 C) (Oral)   Resp 13   Ht '5\' 8"'  (1.727 m)   Wt 87.3 kg   SpO2 96%   BMI 29.26 kg/m  Physician Discharge Summary  Patient ID: Arthur Smith MRN: [id number] DOB/AGE: 1951-03-20 71 y.o.  Admit date: 04/24/2022 Discharge date: 04/29/2022  Admission Diagnoses:spondylolisthesis  Spinal stenosis, lumbar region with neurogenic claudication  Discharge Diagnoses:  Principal Problem:   Lumbar back pain Active Problems:   Spondylolisthesis of lumbar region   Spinal stenosis of lumbar region   ABLA (acute blood loss anemia)   Hypotension   Pressure injury of skin   Discharged Condition: good  Hospital Course: Arthur Smith was admitted and taken to the operating room for a redo laminectomy, decompression, and multilevel arthrodesis L3-S1. Post op due to intraoperative blood loss he had a slight AKI, which resolved with blood transfusions, hydration, and time. He is moving all extremities well at discharge. Wound is clean, no signs of infection. Discharged to Rehab.  Treatments: IV hydration and surgery: Lumbar three-four Lumbar four-five Lumbar five-Sacral one Bilateral Posterior Lumbar Interbody Fusion , pedicle screw fixation Nuvasive, inter body cages L3/4,4/5,5/1 [employer] Autograft morsels packed in disc spaces and cages Laminectomy L3,4,and 5  Discharge Exam: Blood pressure 128/79, pulse 85, temperature 98.5 F (36.9 C), temperature source Oral, resp. rate 13, height '5\' 8"'  (1.727 m), weight 87.3 kg, SpO2 96 %. General appearance: alert, cooperative, appears stated age, and mild distress Moving all extremities well Disposition: Discharge disposition: [place] Not Defined      Spinal stenosis, lumbar region with neurogenic claudication  Allergies as of 04/29/2022       Reactions   Eggs Or Egg-derived Products Diarrhea   Egg yolks cause stomach cramps and diarrhea   Lactose Intolerance (gi) Diarrhea   Causes  stomach cramps and diarrhea        Medication List     TAKE these medications    albuterol 108 (90 Base) MCG/ACT inhaler Commonly known as: VENTOLIN HFA Inhale 2 puffs into the lungs every 6 (six) hours as needed for wheezing or shortness of breath.   aspirin EC 81 MG tablet Take 1 tablet (81 mg total) by mouth See admin instructions.   atorvastatin 80 MG tablet Commonly known as: LIPITOR Take 1 tablet (80 mg total) by mouth at bedtime.   Beano Tabs Take 1 tablet by mouth daily as needed (flatulence).   blood glucose meter kit and supplies Dispense based on patient and insurance preference. Use up to four times daily as directed. (FOR ICD-10 E10.9, E11.9).   cholecalciferol 25 MCG (1000 UNIT) tablet Commonly known as: VITAMIN D3 Take 1,000 Units by mouth daily.   clopidogrel 75 MG tablet Commonly known as: PLAVIX Take 1 tablet (75 mg total) by mouth daily.   Cyanocobalamin 2500 MCG Tabs Take 2,500 mcg by mouth daily.   ferrous sulfate 325 (65 FE) MG EC tablet Take 325 mg by mouth daily.   finasteride 5 MG tablet Commonly known as: PROSCAR Take 5 mg by mouth daily.   Fish Oil 1000 MG Caps Take 1,000 mg by mouth daily.   fluticasone 50 MCG/ACT nasal spray Commonly known as: FLONASE Place 2 sprays into both nostrils daily as needed for allergies.   gabapentin 800 MG tablet Commonly known as: NEURONTIN Take 1 tablet (800 mg total) by mouth 4 (four) times daily.   GARLIC PO Take 1 capsule by mouth  daily.   imipramine 150 MG capsule Commonly known as: TOFRANIL-PM TAKE 1 CAPSULE BY MOUTH AT BEDTIME.   metoprolol succinate 25 MG 24 hr tablet Commonly known as: TOPROL-XL TAKE 1 TABLET (25 MG TOTAL) BY MOUTH DAILY.   nitroGLYCERIN 0.4 MG SL tablet Commonly known as: NITROSTAT Place 1 tablet (0.4 mg total) under the tongue every 5 (five) minutes as needed for chest pain. DON'T take within 24 hrs of Viagra   pantoprazole 40 MG tablet Commonly known as:  PROTONIX Take 40 mg by mouth daily.   senna-docusate 8.6-50 MG tablet Commonly known as: Senokot-S Take 2 tablets by mouth at bedtime as needed (constipation).   tamsulosin 0.4 MG Caps capsule Commonly known as: FLOMAX Take 0.4 mg by mouth every 12 (twelve) hours.   Vitamin B Complex Tabs Take 1 tablet by mouth daily.   zonisamide 100 MG capsule Commonly known as: ZONEGRAN TAKE 3 AT NIGHT What changed: See the new instructions.        Follow-up Information     Ashok Pall, MD Follow up.   Specialty: Neurosurgery Why: keep your scheduled appointment Contact information: [address] [place] [address] [phone]                 Signed: Ashok Pall 04/29/2022, 2:11 PM

## 2022-04-29 NOTE — Progress Notes (Signed)
Received patient from ICU, assessment completed, VS documented, oriented patient to the room.  Will continue to monitor.  ?

## 2022-04-29 NOTE — Progress Notes (Signed)
Physical Therapy Treatment Patient Details Name: Arthur Smith MRN: [id number] DOB: 10-03-1950 Today's Date: 04/29/2022   History of Present Illness Pt admittted 9/8 for  L3-S1 decompression, instrumentation interbody fusion. Post op hypotension and blood loss anemia, transferred to ICU.  PMH:  CAD, diabetes, GERD, history of significant spinal stenosis and prior surgeries.  He has lower extremity claudication and paresthesias, some associated urinary retention.    PT Comments    Pt is motivated to participate and improve. He does display deficits in memory, sequencing, and processing speed along with deficits in safety awareness, needing repeated cues for spinal precautions and to maintain knee extension during stance when ambulating. He did need cues to prompt recognition of his fatigue and need to return to room when ambulating as knee buckling was noted to be present and increasing in intensity as he fatigued. Pt requiring modA for transfers and to ambulate with a RW due to lower extremity weakness and imbalance. He remains at high risk for falls. Will continue to follow acutely. Current recommendations remain appropriate.    Recommendations for follow up therapy are one component of a multi-disciplinary discharge planning process, led by the attending physician.  Recommendations may be updated based on patient status, additional functional criteria and insurance authorization.  Follow Up Recommendations  Acute inpatient rehab (3hours/day)     Assistance Recommended at Discharge Frequent or constant Supervision/Assistance  Patient can return home with the following A lot of help with walking and/or transfers;A little help with bathing/dressing/bathroom;Help with stairs or ramp for entrance;Assist for transportation;Assistance with cooking/housework;Direct supervision/assist for medications management;Direct supervision/assist for financial management   Equipment Recommendations  Rolling  walker (2 wheels);BSC/3in1    Recommendations for Other Services       Precautions / Restrictions Precautions Precautions: Fall Precaution Booklet Issued: Yes (comment) Precaution Comments: needs prompting to recall spinal precautions Required Braces or Orthoses:  (no brace needed orders) Restrictions Weight Bearing Restrictions: No     Mobility  Bed Mobility Overal bed mobility: Needs Assistance Bed Mobility: Rolling, Sidelying to Sit Rolling: Min guard Sidelying to sit: Min assist, HOB elevated       General bed mobility comments: Min guard assist with cues to log roll to R using bed rail. MinA to elevate trunk to sit up EOB, cuing pt to bring elbow under him to push up off the bed.    Transfers Overall transfer level: Needs assistance Equipment used: Rolling walker (2 wheels) Transfers: Sit to/from Stand Sit to Stand: Mod assist           General transfer comment: ModA to power up to stand from EOB, cuing pt to push up from bed, good initiation but got stuck after lifting buttocks a few inches off the bed surface. MinA to practice x5 mini sit <> stand transfers from recliner with hands on arm rest and just coming to partial stand (as far as able with hands still on arm rests).    Ambulation/Gait Ambulation/Gait assistance: Min assist Gait Distance (Feet): 115 Feet Assistive device: Rolling walker (2 wheels) Gait Pattern/deviations: Step-through pattern, Trunk flexed, Knees buckling Gait velocity: reduced Gait velocity interpretation: <1.31 ft/sec, indicative of household ambulator   General Gait Details: Pt with slow gait and flexed posture. Repeated cues provided to let shoulders rest and descend and adduct scapulas, but min momentary success. Pt with slight knee buckling, which increased in intensity as distance progressed. Pt needing repeated cues to push through RW and extend knees, along with minA, for stability  throughout.   Stairs              Wheelchair Mobility    Modified Rankin (Stroke Patients Only)       Balance Overall balance assessment: Needs assistance Sitting-balance support: Feet supported, Bilateral upper extremity supported Sitting balance-Leahy Scale: Good Sitting balance - Comments: relies on UE support   Standing balance support: Bilateral upper extremity supported, During functional activity, Reliant on assistive device for balance Standing balance-Leahy Scale: Poor Standing balance comment: reliant on B UE and Min A balance                            Cognition Arousal/Alertness: Awake/alert Behavior During Therapy: WFL for tasks assessed/performed Overall Cognitive Status: Impaired/Different from baseline Area of Impairment: Memory, Safety/judgement, Problem solving                     Memory: Decreased short-term memory, Decreased recall of precautions   Safety/Judgement: Decreased awareness of safety, Decreased awareness of deficits   Problem Solving: Slow processing, Difficulty sequencing, Requires verbal cues General Comments: Pt needs prompting to recall spinal precautions. Upon entry, pt was having difficulty managing his phone, repeatedly getting on game apps when he states he is trying to figure out how to call his wife. PT assisted him. Needs cues to sequence mobility while maintaining spinal precautions.        Exercises Other Exercises Other Exercises: MinA to practice x5 mini sit <> stand transfers from recliner with hands on arm rest and just coming to partial stand (as far as able with hands still on arm rests)    General Comments General comments (skin integrity, edema, etc.): VSS throughout, but reported some dizziness with exertion that would recover with rest      Pertinent Vitals/Pain Pain Assessment Pain Assessment: 0-10 Pain Smith: 5  Pain Location: back Pain Descriptors / Indicators: Discomfort, Grimacing, Operative site guarding Pain  Intervention(s): Monitored during session, Limited activity within patient's tolerance, Repositioned    Home Living                          Prior Function            PT Goals (current goals can now be found in the care plan section) Acute Rehab PT Goals Patient Stated Goal: to improve PT Goal Formulation: With patient Time For Goal Achievement: 05/09/22 Potential to Achieve Goals: Good Progress towards PT goals: Progressing toward goals    Frequency    Min 5X/week      PT Plan Current plan remains appropriate    Co-evaluation              AM-PAC PT "6 Clicks" Mobility   Outcome Measure  Help needed turning from your back to your side while in a flat bed without using bedrails?: A Little Help needed moving from lying on your back to sitting on the side of a flat bed without using bedrails?: A Little Help needed moving to and from a bed to a chair (including a wheelchair)?: A Lot Help needed standing up from a chair using your arms (e.g., wheelchair or bedside chair)?: A Lot Help needed to walk in hospital room?: A Little Help needed climbing 3-5 steps with a railing? : Total 6 Click Smith: 14    End of Session Equipment Utilized During Treatment: Gait belt Activity Tolerance: Patient tolerated treatment well Patient left:  in chair;with call bell/phone within reach;with chair alarm set Nurse Communication: Mobility status PT Visit Diagnosis: Unsteadiness on feet (R26.81);Muscle weakness (generalized) (M62.81);Pain;Other abnormalities of gait and mobility (R26.89);Difficulty in walking, not elsewhere classified (R26.2) Pain - part of body:  (back)     Time: 0109-3235 PT Time Calculation (min) (ACUTE ONLY): 27 min  Charges:  $Gait Training: 8-22 mins $Therapeutic Activity: 8-22 mins                     Raymond Gurney, PT, DPT Acute Rehabilitation Services  Office: [phone]    Arthur Smith 04/29/2022, 9:31 AM

## 2022-04-30 ENCOUNTER — Inpatient Hospital Stay (HOSPITAL_COMMUNITY): Payer: Medicare Other

## 2022-04-30 DIAGNOSIS — G959 Disease of spinal cord, unspecified: Secondary | ICD-10-CM | POA: Diagnosis not present

## 2022-04-30 DIAGNOSIS — N179 Acute kidney failure, unspecified: Secondary | ICD-10-CM | POA: Diagnosis not present

## 2022-04-30 DIAGNOSIS — R112 Nausea with vomiting, unspecified: Secondary | ICD-10-CM | POA: Diagnosis not present

## 2022-04-30 DIAGNOSIS — E1142 Type 2 diabetes mellitus with diabetic polyneuropathy: Secondary | ICD-10-CM | POA: Diagnosis not present

## 2022-04-30 DIAGNOSIS — D62 Acute posthemorrhagic anemia: Secondary | ICD-10-CM

## 2022-04-30 LAB — COMPREHENSIVE METABOLIC PANEL
ALT: 12 U/L (ref 0–44)
AST: 32 U/L (ref 15–41)
Albumin: 2.7 g/dL — ABNORMAL LOW (ref 3.5–5.0)
Alkaline Phosphatase: 63 U/L (ref 38–126)
Anion gap: 9 (ref 5–15)
BUN: 27 mg/dL — ABNORMAL HIGH (ref 8–23)
CO2: 25 mmol/L (ref 22–32)
Calcium: 9.3 mg/dL (ref 8.9–10.3)
Chloride: 105 mmol/L (ref 98–111)
Creatinine, Ser: 2.01 mg/dL — ABNORMAL HIGH (ref 0.61–1.24)
GFR, Estimated: 35 mL/min — ABNORMAL LOW (ref >60)
Glucose, Bld: 127 mg/dL — ABNORMAL HIGH (ref 70–99)
Potassium: 4.5 mmol/L (ref 3.5–5.1)
Sodium: 139 mmol/L (ref 135–145)
Total Bilirubin: 1.1 mg/dL (ref 0.3–1.2)
Total Protein: 5.6 g/dL — ABNORMAL LOW (ref 6.5–8.1)

## 2022-04-30 LAB — CBC WITH DIFFERENTIAL/PLATELET
Abs Immature Granulocytes: 0.07 10*3/uL (ref 0.00–0.07)
Basophils Absolute: 0 10*3/uL (ref 0.0–0.1)
Basophils Relative: 1 %
Eosinophils Absolute: 0.2 10*3/uL (ref 0.0–0.5)
Eosinophils Relative: 3 %
HCT: 30.2 % — ABNORMAL LOW (ref 39.0–52.0)
Hemoglobin: 10.4 g/dL — ABNORMAL LOW (ref 13.0–17.0)
Immature Granulocytes: 1 %
Lymphocytes Relative: 17 %
Lymphs Abs: 1.5 10*3/uL (ref 0.7–4.0)
MCH: 31.2 pg (ref 26.0–34.0)
MCHC: 34.4 g/dL (ref 30.0–36.0)
MCV: 90.7 fL (ref 80.0–100.0)
Monocytes Absolute: 1.3 10*3/uL — ABNORMAL HIGH (ref 0.1–1.0)
Monocytes Relative: 15 %
Neutro Abs: 5.5 10*3/uL (ref 1.7–7.7)
Neutrophils Relative %: 63 %
Platelets: 205 10*3/uL (ref 150–400)
RBC: 3.33 MIL/uL — ABNORMAL LOW (ref 4.22–5.81)
RDW: 13.9 % (ref 11.5–15.5)
WBC: 8.6 10*3/uL (ref 4.0–10.5)
nRBC: 0 % (ref 0.0–0.2)

## 2022-04-30 LAB — GLUCOSE, CAPILLARY
Glucose-Capillary: 114 mg/dL — ABNORMAL HIGH (ref 70–99)
Glucose-Capillary: 157 mg/dL — ABNORMAL HIGH (ref 70–99)
Glucose-Capillary: 151 mg/dL — ABNORMAL HIGH (ref 70–99)
Glucose-Capillary: 102 mg/dL — ABNORMAL HIGH (ref 70–99)

## 2022-04-30 MED ORDER — SORBITOL 70 % SOLN
45.0000 mL | Freq: Every day | Status: DC | PRN
  Administered 2022-05-01: 45 mL via ORAL
  Filled 2022-04-30: qty 60

## 2022-04-30 MED ORDER — ONDANSETRON HCL 4 MG PO TABS
8.0000 mg | ORAL_TABLET | Freq: Three times a day (TID) | ORAL | Status: DC | PRN
  Administered 2022-04-30: 8 mg via ORAL
  Filled 2022-04-30: qty 2

## 2022-04-30 MED ORDER — INSULIN ASPART 100 UNIT/ML IJ SOLN: 0.0000 [IU] | Freq: Every day | INTRAMUSCULAR | Status: DC

## 2022-04-30 MED ORDER — INSULIN ASPART 100 UNIT/ML IJ SOLN
0.0000 [IU] | Freq: Three times a day (TID) | INTRAMUSCULAR | Status: DC
  Administered 2022-04-30 – 2022-05-01 (×2): 2 [IU] via SUBCUTANEOUS
  Administered 2022-05-01: 1 [IU] via SUBCUTANEOUS

## 2022-04-30 NOTE — Plan of Care (Signed)
  Problem: RH Bathing Goal: LTG Patient will bathe all body parts with assist levels (OT) Description: LTG: Patient will bathe all body parts with assist levels (OT) Flowsheets (Taken 04/30/2022 1619) LTG: Pt will perform bathing with assistance level/cueing: Independent with assistive device  LTG: Position pt will perform bathing: Shower   Problem: RH Dressing Goal: LTG Patient will perform upper body dressing (OT) Description: LTG Patient will perform upper body dressing with assist, with/without cues (OT). Flowsheets (Taken 04/30/2022 1619) LTG: Pt will perform upper body dressing with assistance level of: Independent with assistive device Goal: LTG Patient will perform lower body dressing w/assist (OT) Description: LTG: Patient will perform lower body dressing with assist, with/without cues in positioning using equipment (OT) Flowsheets (Taken 04/30/2022 1619) LTG: Pt will perform lower body dressing with assistance level of: Independent with assistive device   Problem: RH Toileting Goal: LTG Patient will perform toileting task (3/3 steps) with assistance level (OT) Description: LTG: Patient will perform toileting task (3/3 steps) with assistance level (OT)  Flowsheets (Taken 04/30/2022 1619) LTG: Pt will perform toileting task (3/3 steps) with assistance level: Independent with assistive device   Problem: RH Toilet Transfers Goal: LTG Patient will perform toilet transfers w/assist (OT) Description: LTG: Patient will perform toilet transfers with assist, with/without cues using equipment (OT) Flowsheets (Taken 04/30/2022 1619) LTG: Pt will perform toilet transfers with assistance level of: Independent with assistive device   Problem: RH Tub/Shower Transfers Goal: LTG Patient will perform tub/shower transfers w/assist (OT) Description: LTG: Patient will perform tub/shower transfers with assist, with/without cues using equipment (OT) Flowsheets (Taken 04/30/2022 1619) LTG: Pt will  perform tub/shower stall transfers with assistance level of: Independent with assistive device LTG: Pt will perform tub/shower transfers from: Tub/shower combination

## 2022-04-30 NOTE — Progress Notes (Signed)
Soap suds enema administered on pt. Nurse felt resistance from stool when inserting tube. Pt tolerated well. Results were a small bowel movement. Not very effective. Will advise night nurse to administer sorbitol solution.    Rito Ehrlich, LPN

## 2022-04-30 NOTE — Evaluation (Signed)
Occupational Therapy Assessment and Plan  Patient Details  Name: Arthur Smith MRN: [id number] Date of Birth: 01/14/1951  OT Diagnosis: acute pain and muscle weakness (generalized) Rehab Potential: Rehab Potential (ACUTE ONLY): Excellent ELOS: 7-10 days   Today's Date: 04/30/2022 OT Individual Time:[phone]     Total time: 55 min  Hospital Problem: Principal Problem:   Lumbar myelopathy ([place]) Active Problems:   Acute kidney injury ([place])   Coronary artery disease involving coronary bypass graft of native heart without angina pectoris   Gastroesophageal reflux disease   Hyperkalemia   Type 2 diabetes mellitus with diabetic polyneuropathy, without long-term current use of insulin ([place])   Past Medical History:  Past Medical History:  Diagnosis Date   Arthritis    BPH without obstruction/lower urinary tract symptoms    Bronchitis with asthma, acute 11/2011   CAD S/P percutaneous coronary angioplasty 11/15/2018   Severe 2 vessel obstructive CAD:. Ost-pCx 95% (DES PCI  STENT SYNERGY DES 3.5X28) &p-mCx 100% (DES PCI STENT SYNERGY DES 2.25X32). pRCA 90%, mRCA 85% & rPDA 90%.dLAD ~40%.  EF 55 to 60%.  Moderately elevated LVEDP.  No R WMA.  Plan staged PCI to 3 RCA-PDA lesions 4/1   Depression    denies   Diabetes mellitus without complication (HCC)    Type II- Diet controlled   Frequency of urination    URINATION AT NIGHT, PAINFUL SLOW STREAM    GERD (gastroesophageal reflux disease)    occ   Hyperlipidemia    Neuromuscular disorder (HCC)    DIZZINESS   PERIPHERAL NEUROPATHY    Neuropathy    No pertinent past medical history    RINGING RIGHT EAR   NSTEMI (non-ST elevated myocardial infarction) ([place]) 11/13/2018   Recurrent upper respiratory infection (URI)    SOB CURRENT COLD   Shortness of breath    d/t "cold" 12/02/11   Past Surgical History:  Past Surgical History:  Procedure Laterality Date   BACK SURGERY     CARDIAC CATHETERIZATION     CORONARY STENT INTERVENTION N/A  11/15/2018   Procedure: CORONARY STENT INTERVENTION;  Surgeon: Martinique, Peter M, MD;  Location: [facility];;  Ost-pCx 95% (DES PCI  STENT SYNERGY DES 3.5X28) &p-mCx 100% (DES PCI STENT SYNERGY DES 2.25X32)   CORONARY STENT INTERVENTION N/A 11/16/2018   Procedure: CORONARY STENT INTERVENTION;  Surgeon: Belva Crome, MD;  Location: [facility];  Service: Cardiovascular;  Laterality: N/A;   FINGER SURGERY     RIGHT MIDDLE FINGER   HERNIA REPAIR     UMBILICAL HERNIA [id number]   LEFT HEART CATH AND CORONARY ANGIOGRAPHY N/A 11/15/2018   Procedure: LEFT HEART CATH AND CORONARY ANGIOGRAPHY;  Surgeon: Martinique, Peter M, MD;  Location: [facility];; Severe 2 vessel obstructive CAD:. Ost-pCx 95% (DES PCI) &p-mCx 100% (DES PCI). pRCA 90%, mRCA 85% & rPDA 90%.dLAD ~40%.  EF 55 to 60%.  Moderately elevated LVEDP.  No R WMA.  -- Plan staged PCI to 3 RCA-PDA lesions 4/1   LUMBAR LAMINECTOMY/DECOMPRESSION MICRODISCECTOMY  12/22/2011   Procedure: LUMBAR LAMINECTOMY/DECOMPRESSION MICRODISCECTOMY 1 LEVEL;  Surgeon: Erline Levine, MD;  Location: [facility];  Service: Neurosurgery;  Laterality: N/A;  Thoracic ten-eleven laminectomy   LUMBAR LAMINECTOMY/DECOMPRESSION MICRODISCECTOMY Right 05/01/2014   Procedure: Right Lumbar three-four Microdiskectomy;  Surgeon: Erline Levine, MD;  Location: [place] [facility];  Service: Neurosurgery;  Laterality: Right;   LUMBAR LAMINECTOMY/DECOMPRESSION MICRODISCECTOMY Right 11/08/2014   Procedure: Right L4-5 L5-S1 Laminectomy;  Surgeon: Erline Levine, MD;  Location: [place];  Service: Neurosurgery;  Laterality: Right;  Right L4-5 L5-S1 Laminectomy   MULTIPLE TOOTH EXTRACTIONS      Assessment & Plan Clinical Impression: Arthur Smith is a 71 year old male with history of CAD, HLD, HTN, polyneuropathy, BPH, DM type 2, lumbar and thoracic stenosis who underwent redo laminectomy, decompression, and multilevel arthrodesis L3-S1 by Dr. Christella Noa on 04/24/2022.  He has a history  of 2 prior L spine surgeries and a thoracic surgery previously by Dr. Vertell Limber.  Prior to his surgery he was having pain in his legs and back that was worse when standing and walking. His surgery was complicated by acute blood loss resulting in hypotension. He was transfused 2 units of PRBCs on 9/10.  He required CCM management with vasopressors. He suffered AKI secondary to ATN. Nephrology was consulted and his was given IVF. His kidneys have recovered with excellent urine output. He was also lethargic with acute encephalopathy secondary to oversedation in setting of AKI. Serum creatinine has trended downward over the last two days. He is moving all extremities well and incision is healing without signs of infection. He is tolerating a heart healthy diet and voiding spontaneously.  He has a history of CAD and is without current complaints. The patient requires   Patient transferred to CIR on 04/29/2022 .    Patient currently requires  SBA-Mod A  with basic self-care skills secondary to muscle weakness, decreased cardiorespiratoy endurance, decreased memory, delayed processing, and decreased higher level cognition/executive functioning, and decreased sitting balance, decreased standing balance, decreased postural control, and decreased balance strategies.  Prior to hospitalization, patient could complete ADL tasks with independent .  Patient will benefit from skilled intervention to increase independence with basic self-care skills prior to discharge home with care partner.  Anticipate patient will require  PRN supervision  and no further OT follow recommended.  OT - End of Session Activity Tolerance: Decreased this session Endurance Deficit: Yes Endurance Deficit Description: Global deconditioning OT Assessment Rehab Potential (ACUTE ONLY): Excellent OT Barriers to Discharge: Other (comments) OT Barriers to Discharge Comments: spinal restrictions OT Patient demonstrates impairments in the following  area(s): Balance;Safety;Pain;Endurance OT Basic ADL's Functional Problem(s): Grooming;Bathing;Dressing;Toileting OT Advanced ADL's Functional Problem(s): Simple Meal Preparation OT Transfers Functional Problem(s): Toilet;Tub/Shower OT Plan OT Intensity: Minimum of 1-2 x/day, 45 to 90 minutes OT Frequency: 5 out of 7 days OT Duration/Estimated Length of Stay: 7-10 days OT Treatment/Interventions: Balance/vestibular training;Neuromuscular re-education;Cognitive remediation/compensation;DME/adaptive equipment instruction;Pain management;Community reintegration;Discharge planning;Functional mobility training;Therapeutic Activities;UE/LE Coordination activities;Patient/family education;Functional electrical stimulation;UE/LE Strength taining/ROM;Self Care/advanced ADL retraining;Therapeutic Exercise OT Basic Self-Care Anticipated Outcome(s): Mod I OT Toileting Anticipated Outcome(s): Mod I OT Bathroom Transfers Anticipated Outcome(s): Mod I OT Recommendation Patient destination: Home Follow Up Recommendations: None Equipment Recommended: None recommended by OT   OT Evaluation Precautions/Restrictions  Precautions Precautions: Fall;Back Precaution Comments: Difficulty with recall of precautions and needs VC. Required Braces or Orthoses:  (No back brace) Restrictions Weight Bearing Restrictions: No Pain Pain Assessment Pain Scale: 0-10 (Pt reports nausea) Pain Score: 0-No pain  Home Living/Prior Functioning Home Living Family/patient expects to be discharged to:: Private residence Living Arrangements: Spouse/significant other Available Help at Discharge: Family, Available 24 hours/day Type of Home: House Home Access: Stairs to enter [employer] of Steps: 5 Entrance Stairs-Rails: Left, Right, Can reach both Home Layout: One level Bathroom Shower/Tub: Tub/shower unit, Curtain (owns a shower chair with back, 1 grab bar) Bathroom Toilet: Standard Bathroom Accessibility:  Yes Additional Comments: Owns a SPC (didn't use  it at baseline) and walking sticks (only used when going on hikes)  Lives With: Spouse IADL History Current License: Yes Occupation: Retired 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 Prior Function Level of Independence: Independent with basic ADLs, Independent with gait, Independent with homemaking with ambulation, Independent with transfers  Able to Take Stairs?: Yes Driving: Yes Vocation: Retired Biomedical scientist: Retired Brewing technologist Leisure: Hobbies-yes (Comment) (Enjoys working on [employer]) Vision Baseline Vision/History: 1 Wears glasses (For near and far vision) Ability to See in Adequate Light: 1 Impaired (Pt reports the prescription in his glasses need to be updated.) Patient Visual Report: No change from baseline Vision Assessment?: No apparent visual deficits Perception  Perception: Within Functional Limits Praxis Praxis: Intact Cognition Cognition Overall Cognitive Status: Impaired/Different from baseline Arousal/Alertness: Awake/alert Memory: Impaired Behaviors: Poor frustration tolerance Brief Interview for Mental Status (BIMS) Repetition of Three Words (First Attempt): 3 Temporal Orientation: Year: Correct Temporal Orientation: Month: Accurate within 5 days Temporal Orientation: Day: Correct Recall: "Sock": Yes, no cue required Recall: "Blue": Yes, no cue required Recall: "Bed": Yes, no cue required BIMS Summary Score: 15 Sensation Sensation Light Touch: Impaired Detail Peripheral sensation comments: BLE diabetic neuropathy; Knees down to feet. Reports past injury to left arm which will cause occassional numbness in left hand. Hot/Cold: Appears Intact Proprioception: Appears Intact Stereognosis: Appears Intact Coordination Gross Motor Movements are Fluid and Coordinated: Yes Fine Motor Movements are Fluid and Coordinated: Yes Motor  Motor Motor: Other (comment) Motor - Skilled  Clinical Observations: Global weakness- Especially in B quads limiting knee extension while in standing.  Trunk/Postural Assessment  Cervical Assessment Cervical Assessment: Within Functional Limits Thoracic Assessment Thoracic Assessment: Exceptions to [facility] (rounded shoulders) Lumbar Assessment Lumbar Assessment: Exceptions to [facility] (posterior pelvic tilt) Postural Control Postural Control: Deficits on evaluation (delayed)  Balance Balance Balance Assessed: Yes Static Sitting Balance Static Sitting - Balance Support: Bilateral upper extremity supported;Feet supported Static Sitting - Level of Assistance: 5: Stand by assistance Dynamic Sitting Balance Dynamic Sitting - Balance Support: Feet supported Dynamic Sitting - Level of Assistance:  (CGA) Static Standing Balance Static Standing - Balance Support: Bilateral upper extremity supported Static Standing - Level of Assistance: 4: Min assist Dynamic Standing Balance Dynamic Standing - Balance Support: Bilateral upper extremity supported;During functional activity Dynamic Standing - Level of Assistance: 4: Min assist Extremity/Trunk Assessment RUE Assessment RUE Assessment: Within Functional Limits General Strength Comments: Unable to fully assess due to spinal precautions LUE Assessment LUE Assessment: Within Functional Limits General Strength Comments: unable to fully assess due to spinal precautions  Care Tool Care Tool Self Care Eating   Eating Assist Level: Independent with assistive device Eating Assistive Level Comment: upper and lower dentures  Oral Care    Oral Care Assist Level: Set up assist    Bathing   Body parts bathed by patient: Right arm;Left arm;Chest;Abdomen;Front perineal area;Buttocks;Right upper leg;Left upper leg;Face Body parts bathed by helper: Right lower leg;Left lower leg   Assist Level: Minimal Assistance - Patient > 75%    Upper Body Dressing(including orthotics)   What is the patient wearing?:  Pull over shirt   Assist Level: Supervision/Verbal cueing    Lower Body Dressing (excluding footwear)   What is the patient wearing?: Pants;Underwear/pull up Assist for lower body dressing: Minimal Assistance - Patient > 75%    Putting on/Taking off footwear   What is the patient wearing?: Shoes;Socks Assist for footwear: Supervision/Verbal cueing       Care Tool Toileting Toileting activity  Assist for toileting: Minimal Assistance - Patient > 75%     Care Tool Bed Mobility    Sit to lying activity   Sit to lying assist level: Minimal Assistance - Patient > 75%        Care Tool Transfers Sit to stand transfer   Sit to stand assist level: Minimal Assistance - Patient > 75%    Chair/bed transfer   Chair/bed transfer assist level: Minimal Assistance - Patient > 75%     Toilet transfer   Assist Level: Minimal Assistance - Patient > 75%     Care Tool Cognition  Expression of Ideas and Wants Expression of Ideas and Wants: 2. Frequent difficulty - frequently exhibits difficulty with expressing needs and ideas  Understanding Verbal and Non-Verbal Content Understanding Verbal and Non-Verbal Content: 2. Sometimes understands - understands only basic conversations or simple, direct phrases. Frequently requires cues to understand   Memory/Recall Ability Memory/Recall Ability : Current season;That he or she is in a hospital/hospital unit   Refer to Care Plan for [place] 1 OT Short Term Goal 1 (Week 1): STG = LTG (due to ELOS)  Recommendations for other services: None    Skilled Therapeutic Intervention Pt in w/c upon therapy arrival with wife present in room. Pt agreeable to participate in OT evaluation. Currently experiencing nausea which has been ongoing since yesterday at dinner; beef did not look agreeable to him. Experienced frequent belching during session although no vomit produced. Modified evaluation completed due to nausea with clinical  judgement used to assess BADL performance. Education provided on back precautions, compensatory techniques to assist with LB dressing in order to maintain precautions, therapy schedule, and therapy goals. Pt agreeable to goal level for therapy. Pt is currently presenting with decreased strength, endurance, activity tolerance due to recent spinal surgery and requires additional physical assistance and/or modifications to complete BADL tasks while maintaining spinal precautions.    ADL ADL Eating: Modified independent (upper and lower dentures) Where Assessed-Eating: Wheelchair Grooming: Setup Where Assessed-Grooming: Wheelchair;Sitting at sink Upper Body Bathing: Setup Where Assessed-Upper Body Bathing: Wheelchair Lower Body Bathing: Minimal assistance Where Assessed-Lower Body Bathing: Standing at sink;Sitting at sink Upper Body Dressing: Supervision/safety Where Assessed-Upper Body Dressing: Wheelchair Lower Body Dressing: Minimal assistance Where Assessed-Lower Body Dressing: Wheelchair Toileting: Moderate assistance Where Assessed-Toileting: Bedside Commode;Glass blower/designer: Minimal assistance;Distant Engineer, maintenance (IT) Method: Actuary: Not assessed Social research officer, government: Not assessed Mobility  Bed Mobility Bed Mobility: Sit to Supine  Sit to Supine: Minimal Assistance - Patient > 75% (to assist with raising BLE in order to maintain spinal precautions) Transfers Sit to Stand: Minimal Assistance - Patient > 75% Stand to Sit: Minimal Assistance - Patient > 75%   Discharge Criteria: Patient will be discharged from OT if patient refuses treatment 3 consecutive times without medical reason, if treatment goals not met, if there is a change in medical status, if patient makes no progress towards goals or if patient is discharged from hospital.  The above assessment, treatment plan, treatment alternatives and goals were discussed and mutually  agreed upon: by patient  Ailene Ravel, OTR/L,CBIS  Supplemental OT - [facility]  04/30/2022, 6:04 PM

## 2022-04-30 NOTE — Evaluation (Signed)
Physical Therapy Assessment and Plan  Patient Details  Name: Arthur Smith MRN: [id number] Date of Birth: 08-13-1951  PT Diagnosis: Abnormality of gait, Difficulty walking, Impaired cognition, Low back pain, and Muscle weakness Rehab Potential: Good ELOS: 7-10 days   Today's Date: 04/30/2022 PT Individual Time: 1st treatment session: 0800-0900; 2nd treatment session: 1400-1500 PT Individual Time Calculation (min): 60 min    Hospital Problem: Principal Problem:   Lumbar myelopathy ([place]) Active Problems:   Acute kidney injury ([place])   Coronary artery disease involving coronary bypass graft of native heart without angina pectoris   Gastroesophageal reflux disease   Hyperkalemia   Type 2 diabetes mellitus with diabetic polyneuropathy, without long-term current use of insulin ([place])   Past Medical History:  Past Medical History:  Diagnosis Date   Arthritis    BPH without obstruction/lower urinary tract symptoms    Bronchitis with asthma, acute 11/2011   CAD S/P percutaneous coronary angioplasty 11/15/2018   Severe 2 vessel obstructive CAD:. Ost-pCx 95% (DES PCI  STENT SYNERGY DES 3.5X28) &p-mCx 100% (DES PCI STENT SYNERGY DES 2.25X32). pRCA 90%, mRCA 85% & rPDA 90%.dLAD ~40%.  EF 55 to 60%.  Moderately elevated LVEDP.  No R WMA.  Plan staged PCI to 3 RCA-PDA lesions 4/1   Depression    denies   Diabetes mellitus without complication (HCC)    Type II- Diet controlled   Frequency of urination    URINATION AT NIGHT, PAINFUL SLOW STREAM    GERD (gastroesophageal reflux disease)    occ   Hyperlipidemia    Neuromuscular disorder (HCC)    DIZZINESS   PERIPHERAL NEUROPATHY    Neuropathy    No pertinent past medical history    RINGING RIGHT EAR   NSTEMI (non-ST elevated myocardial infarction) ([place]) 11/13/2018   Recurrent upper respiratory infection (URI)    SOB CURRENT COLD   Shortness of breath    d/t "cold" 12/02/11   Past Surgical History:  Past Surgical History:  Procedure  Laterality Date   BACK SURGERY     CARDIAC CATHETERIZATION     CORONARY STENT INTERVENTION N/A 11/15/2018   Procedure: CORONARY STENT INTERVENTION;  Surgeon: Martinique, Peter M, MD;  Location: [facility];;  Ost-pCx 95% (DES PCI  STENT SYNERGY DES 3.5X28) &p-mCx 100% (DES PCI STENT SYNERGY DES 2.25X32)   CORONARY STENT INTERVENTION N/A 11/16/2018   Procedure: CORONARY STENT INTERVENTION;  Surgeon: Belva Crome, MD;  Location: [facility];  Service: Cardiovascular;  Laterality: N/A;   FINGER SURGERY     RIGHT MIDDLE FINGER   HERNIA REPAIR     UMBILICAL HERNIA [id number]   LEFT HEART CATH AND CORONARY ANGIOGRAPHY N/A 11/15/2018   Procedure: LEFT HEART CATH AND CORONARY ANGIOGRAPHY;  Surgeon: Martinique, Peter M, MD;  Location: [facility];; Severe 2 vessel obstructive CAD:. Ost-pCx 95% (DES PCI) &p-mCx 100% (DES PCI). pRCA 90%, mRCA 85% & rPDA 90%.dLAD ~40%.  EF 55 to 60%.  Moderately elevated LVEDP.  No R WMA.  -- Plan staged PCI to 3 RCA-PDA lesions 4/1   LUMBAR LAMINECTOMY/DECOMPRESSION MICRODISCECTOMY  12/22/2011   Procedure: LUMBAR LAMINECTOMY/DECOMPRESSION MICRODISCECTOMY 1 LEVEL;  Surgeon: Erline Levine, MD;  Location: [facility];  Service: Neurosurgery;  Laterality: N/A;  Thoracic ten-eleven laminectomy   LUMBAR LAMINECTOMY/DECOMPRESSION MICRODISCECTOMY Right 05/01/2014   Procedure: Right Lumbar three-four Microdiskectomy;  Surgeon: Erline Levine, MD;  Location: [facility];  Service: Neurosurgery;  Laterality: Right;   LUMBAR LAMINECTOMY/DECOMPRESSION MICRODISCECTOMY Right 11/08/2014  Procedure: Right L4-5 L5-S1 Laminectomy;  Surgeon: Erline Levine, MD;  Location: [facility];  Service: Neurosurgery;  Laterality: Right;  Right L4-5 L5-S1 Laminectomy   MULTIPLE TOOTH EXTRACTIONS      Assessment & Plan Clinical Impression: Patient is a 71 year old male with history of CAD, HLD, HTN, polyneuropathy, BPH, DM type 2, lumbar and thoracic stenosis who underwent redo laminectomy,  decompression, and multilevel arthrodesis L3-S1 by Dr. Christella Noa on 04/24/2022.  He has a history of 2 prior L spine surgeries and a thoracic surgery previously by Dr. Vertell Limber.  Prior to his surgery he was having pain in his legs and back that was worse when standing and walking. His surgery was complicated by acute blood loss resulting in hypotension. He was transfused 2 units of PRBCs on 9/10.  He required CCM management with vasopressors. He suffered AKI secondary to ATN. Nephrology was consulted and his was given IVF. His kidneys have recovered with excellent urine output. He was also lethargic with acute encephalopathy secondary to oversedation in setting of AKI. Serum creatinine has trended downward over the last two days. He is moving all extremities well and incision is healing without signs of infection. He is tolerating a heart healthy diet and voiding spontaneously.  He has a history of CAD and is without current complaints. The patient requires inpatient physical medicine and rehabilitation evaluations and treatment secondary to dysfunction due to lumbar myelopathy    Patient currently requires min with mobility secondary to muscle weakness, decreased cardiorespiratoy endurance, peripheral, and decreased standing balance, decreased postural control, decreased balance strategies, and difficulty maintaining precautions.  Prior to hospitalization, patient was independent  with mobility and lived with Spouse in a single-story home.  Home access is 5Stairs to enter.  Patient will benefit from skilled PT intervention to maximize safe functional mobility, minimize fall risk, and decrease caregiver burden for planned discharge home with intermittent assist.  Anticipate patient will benefit from follow up [facility] at discharge.  PT - End of Session Activity Tolerance: Tolerates 30+ min activity with multiple rests Endurance Deficit: Yes Endurance Deficit Description: Global deconditioning PT Assessment Rehab  Potential (ACUTE/IP ONLY): Good PT Barriers to Discharge: Insurance for SNF coverage PT Patient demonstrates impairments in the following area(s): Balance;Pain;Safety;Endurance;Motor;Sensory;Skin Integrity PT Transfers Functional Problem(s): Bed Mobility;Bed to Chair;Car PT Locomotion Functional Problem(s): Ambulation;Stairs PT Plan PT Intensity: Minimum of 1-2 x/day ,45 to 90 minutes PT Frequency: 5 out of 7 days PT Duration Estimated Length of Stay: 7-10 days PT Treatment/Interventions: Ambulation/gait training;Cognitive remediation/compensation;Discharge planning;DME/adaptive equipment instruction;Functional mobility training;Pain management;Psychosocial support;Splinting/orthotics;Therapeutic Activities;UE/LE Strength taining/ROM;Visual/perceptual remediation/compensation;Balance/vestibular training;Community reintegration;Disease management/prevention;Neuromuscular re-education;Patient/family education;Skin care/wound management;Stair training;Therapeutic Exercise;UE/LE Coordination activities PT Transfers Anticipated Outcome(s): ModI with RW PT Locomotion Anticipated Outcome(s): ModI with RW, household distances PT Recommendation Follow Up Recommendations: Home health PT Patient destination: Home Equipment Recommended: To be determined Equipment Details: TBD   PT Evaluation Precautions/Restrictions Precautions Precautions: Fall;Back Precaution Comments: Spinal precautions- Unable to recall precautions at this time- Wife able to cue patient, however required assistance for 2/3 precautions Required Braces or Orthoses:  (No back brace required) Restrictions Weight Bearing Restrictions: No Pain Interference Pain Interference Pain Effect on Sleep: 4. Almost constantly Pain Interference with Therapy Activities: 2. Occasionally Pain Interference with Day-to-Day Activities: 3. Frequently Home Living/Prior Functioning Home Living Available Help at Discharge: Family;Available 24  hours/day Type of Home: House Home Access: Stairs to enter [employer] of Steps: 5 Entrance Stairs-Rails: Left;Right;Can reach both Home Layout: One level Bathroom Shower/Tub: Tub/shower unit;Curtain Bathroom  Toilet: Standard Bathroom Accessibility: Yes Additional Comments: Owns a SPC (didn't use it at baseline) and walking sticks (only used when going on hikes)  Lives With: Spouse Prior Function Level of Independence: Independent with basic ADLs;Independent with gait;Independent with homemaking with ambulation;Independent with transfers  Able to Take Stairs?: Yes Driving: Yes Vocation: Retired Biomedical scientist: Retired Brewing technologist Leisure: Hobbies-yes (Comment) (Enjoys working on [employer]) Vision/Perception  Vision - History Ability to See in Adequate Light: 1 Impaired Perception Perception: Within Functional Limits Praxis Praxis: Intact  Cognition Overall Cognitive Status: Impaired/Different from baseline Arousal/Alertness: Awake/alert Orientation Level: Oriented X4 Year: 2023 Month: September Day of Week: Incorrect Memory: Impaired Awareness: Appears intact Problem Solving: Impaired Behaviors: Poor frustration tolerance Safety/Judgment: Impaired Sensation Sensation Light Touch: Impaired by gross assessment Peripheral sensation comments: BLE diabetic neuropathy- Mid calf to feet; LE sensation is more dull compared to UE sensation Light Touch Impaired Details: Impaired RLE;Impaired LLE;Absent RLE;Absent LLE Hot/Cold: Appears Intact Proprioception: Appears Intact Stereognosis: Appears Intact Coordination Gross Motor Movements are Fluid and Coordinated: Yes Fine Motor Movements are Fluid and Coordinated: Yes Motor  Motor Motor: Other (comment) Motor - Skilled Clinical Observations: Global weakness- Especially in B quads limiting knee extension while in standing.   Trunk/Postural Assessment  Cervical Assessment Cervical Assessment: Within  Functional Limits Thoracic Assessment Thoracic Assessment: Within Functional Limits Lumbar Assessment Lumbar Assessment: Within Functional Limits Postural Control Postural Control: Deficits on evaluation (Delayed)  Balance Balance Balance Assessed: Yes Static Sitting Balance Static Sitting - Balance Support: Bilateral upper extremity supported;Feet supported Static Sitting - Level of Assistance: 5: Stand by assistance Dynamic Sitting Balance Dynamic Sitting - Balance Support: Feet supported Dynamic Sitting - Level of Assistance:  (CGA) Static Standing Balance Static Standing - Balance Support: Bilateral upper extremity supported Static Standing - Level of Assistance: 4: Min assist Dynamic Standing Balance Dynamic Standing - Balance Support: Bilateral upper extremity supported;During functional activity Dynamic Standing - Level of Assistance: 4: Min assist Extremity Assessment      RLE Assessment RLE Assessment: Exceptions to [facility] General Strength Comments: R LE strength assessed with functional mobility- Grossly 3+/5 LLE Assessment LLE Assessment: Exceptions to [facility] General Strength Comments: L LE strength assessed with functional mobility- Grossly 3+/5  Care Tool Care Tool Bed Mobility Roll left and right activity   Roll left and right assist level: Minimal Assistance - Patient > 75%    Sit to lying activity   Sit to lying assist level: Minimal Assistance - Patient > 75%    Lying to sitting on side of bed activity   Lying to sitting on side of bed assist level: the ability to move from lying on the back to sitting on the side of the bed with no back support.: Minimal Assistance - Patient > 75%     Care Tool Transfers Sit to stand transfer   Sit to stand assist level: Minimal Assistance - Patient > 75%    Chair/bed transfer   Chair/bed transfer assist level: Minimal Assistance - Patient > 75%     Toilet transfer   Assist Level: Minimal Assistance - Patient > 75%     Car transfer   Car transfer assist level: Minimal Assistance - Patient > 75%      Care Tool Locomotion Ambulation   Assist level: Minimal Assistance - Patient > 75% Assistive device: Walker-rolling Max distance: 90  Walk 10 feet activity   Assist level: Minimal Assistance - Patient > 75% Assistive device: Walker-rolling   Walk 50 feet with 2  turns activity   Assist level: Minimal Assistance - Patient > 75% Assistive device: Walker-rolling  Walk 150 feet activity Walk 150 feet activity did not occur: Safety/medical concerns (Unable to ambulate >90' at this time secondary to fatigue/nausea/poor endurance/activity tolerance)      Walk 10 feet on uneven surfaces activity   Assist level: Minimal Assistance - Patient > 75% Assistive device: Walker-rolling  Stairs   Assist level: Minimal Assistance - Patient > 75% Stairs assistive device: 2 hand rails Max number of stairs: 4  Walk up/down 1 step activity   Walk up/down 1 step (curb) assist level: Minimal Assistance - Patient > 75% Walk up/down 1 step or curb assistive device: 2 hand rails  Walk up/down 4 steps activity   Walk up/down 4 steps assist level: Minimal Assistance - Patient > 75% Walk up/down 4 steps assistive device: 2 hand rails  Walk up/down 12 steps activity Walk up/down 12 steps activity did not occur: Safety/medical concerns (Unable to ascend/descend >4 steps at this time secondary to impaired endurance/activity tolerance and decreased global strength)      Pick up small objects from floor Pick up small object from the floor (from standing position) activity did not occur: Safety/medical concerns (Unable to pick up an item from the floor without the intervention of AE secondary to spinal precautions)      Wheelchair Is the patient using a wheelchair?: No          Wheel 50 feet with 2 turns activity      Wheel 150 feet activity        Refer to Care Plan for Long Term Goals  SHORT TERM GOAL WEEK 1 PT Short  Term Goal 1 (Week 1): STG=LTG secondary to ELOS  Recommendations for other services: None   Skilled Therapeutic Intervention Mobility Bed Mobility Bed Mobility: Sit to Supine;Rolling Right;Rolling Left;Supine to Sit Rolling Right: Minimal Assistance - Patient > 75% Rolling Left: Minimal Assistance - Patient > 75% Supine to Sit: Minimal Assistance - Patient > 75% Sit to Supine: Minimal Assistance - Patient > 75% Transfers Transfers: Sit to Stand;Stand to Sit;Transfer Sit to Stand: Minimal Assistance - Patient > 75% Stand to Sit: Minimal Assistance - Patient > 75% Stand Pivot Transfers: Minimal Assistance - Patient > 75% Stand Pivot Transfer Details: Verbal cues for safe use of DME/AE;Verbal cues for precautions/safety;Verbal cues for technique Transfer (Assistive device): Rolling walker Locomotion  Gait Ambulation: Yes Gait Assistance: Minimal Assistance - Patient > 75% Gait Distance (Feet): 90 Feet Assistive device: Rolling walker Gait Assistance Details: Verbal cues for sequencing;Verbal cues for safe use of DME/AE;Verbal cues for gait pattern;Verbal cues for technique Gait Gait: Yes Gait Pattern: Step-to pattern;Narrow base of support;Shuffle;Left flexed knee in stance;Right flexed knee in stance Gait velocity: Decreased Stairs / Additional Locomotion Stairs: Yes Stairs Assistance: Minimal Assistance - Patient > 75% Stair Management Technique: Two rails Number of Stairs: 4 Height of Stairs: 6 Ramp: Minimal Assistance - Patient >75% Curb: Minimal Assistance - Patient >75% Wheelchair Mobility Wheelchair Mobility: No  Skilled Intervention- 1st Treatment Session Patient greeted supine in bed with spouse present and agreeable to PT treatment session. Patient complaining on nausea at start of session- Spouse stating the patient hasn't had a true BM in a couple of days and his nausea is due to that. MD notified and aware. Throughout session, patient required minimal assistance  with all functional mobility with the use of a RW. Patient demonstrated decreased LE strength leading to increased B knee flexion  with all functional mobility. VC throughout treatment session for recalling spinal precautions with all functional tasks. Patient left sitting upright in wheelchair in room with spouse present and transitioned to OT care.   2nd Treatment Session- Patient greeted supine in bed and agreeable to PT treatment session. Patient transitioned from supine to sitting EOB with CGA for guiding and use of bed rail- VC for maintaining spinal precautions throughout transition. Patient ambulated to bathroom with RW and CGA/MinA for improved stability. Patient attempted to have BM, however was unsuccessful at this time. Patient wheeled to the rehab gym via wheelchair for time management. Patient gait trained x90' with RW and [place] for improved postural extension, stepping within the RW and improved TKE. Patient required extended seated rest break secondary to poor endurance/activity tolerance. Patient ascended/descended x4 steps with B HR and MinA for lifting- VC/TC for improved TKE when ascending the steps. Patient utilized a step-to pattern leading with R LE while ascending and L LE while descending. Patient performed a car transfer from wc to/from car simulator with RW and MinA- Once seated in the car simulator, patient was able to place B LE into/out of the car with VC for maintaining spinal precautions. Patient ascended/descended a low grade ramp with RW and MinA for improved safety- Patient continues to require VC for stepping within the RW, improved postural extension and improved B TKE. Patient educated thoroughly on improved sit/stand mechanics- Scooting forward, tucking B feet underneath him and increased anterior weight shift. Educated patient on hinging at the hips versus bending his low back. Patient returned to his room and performed stand pivot transfer from wc to sitting EOB with RW  and CGA. Once seated on the bed patient attempted to transition to supine without performing log rolling technique- Patient again educated on proper log rolling technique in order to maintain spinal precautions. Patient left supine in bed with transporter present to take patient for Xray.    Discharge Criteria: Patient will be discharged from PT if patient refuses treatment 3 consecutive times without medical reason, if treatment goals not met, if there is a change in medical status, if patient makes no progress towards goals or if patient is discharged from hospital.  The above assessment, treatment plan, treatment alternatives and goals were discussed and mutually agreed upon: by patient  [place] 04/30/2022, 3:41 PM

## 2022-04-30 NOTE — Progress Notes (Signed)
Inpatient Rehabilitation Center Individual Statement of Services  Patient Name:  Arthur Smith  Date:  04/30/2022  Welcome to the Inpatient Rehabilitation Center.  Our goal is to provide you with an individualized program based on your diagnosis and situation, designed to meet your specific needs.  With this comprehensive rehabilitation program, you will be expected to participate in at least 3 hours of rehabilitation therapies Monday-Friday, with modified therapy programming on the weekends.  Your rehabilitation program will include the following services:  Physical Therapy (PT), Occupational Therapy (OT), 24 hour per day rehabilitation nursing, Therapeutic Recreaction (TR), Care Coordinator, Rehabilitation Medicine, Nutrition Services, and Pharmacy Services  Weekly team conferences will be held on Wednesday to discuss your progress.  Your Inpatient Rehabilitation Care Coordinator will talk with you frequently to get your input and to update you on team discussions.  Team conferences with you and your family in attendance may also be held.  Expected length of stay: 7-10 days  Overall anticipated outcome:independent with device   Depending on your progress and recovery, your program may change. Your Inpatient Rehabilitation Care Coordinator will coordinate services and will keep you informed of any changes. Your Inpatient Rehabilitation Care Coordinator's name and contact numbers are listed  below.  The following services may also be recommended but are not provided by the Inpatient Rehabilitation Center:  Driving Evaluations Home Health Rehabiltiation Services Outpatient Rehabilitation Services    Arrangements will be made to provide these services after discharge if needed.  Arrangements include referral to agencies that provide these services.  Your insurance has been verified to be:  Medicare & Aetna Your primary doctor is:  Kirby Funk  Pertinent information will be shared with your  doctor and your insurance company.  Inpatient Rehabilitation Care Coordinator:  Dossie Der, [place] [phone] or ([phone]  Information discussed with and copy given to patient by: Lucy Chris, 04/30/2022, 10:30 AM

## 2022-04-30 NOTE — Progress Notes (Signed)
Inpatient Rehabilitation  Patient information reviewed and entered into eRehab system by Dynasti Kerman M. Cyler Kappes, M.A., CCC/SLP, PPS Coordinator.  Information including medical coding, functional ability and quality indicators will be reviewed and updated through discharge.    

## 2022-04-30 NOTE — Progress Notes (Signed)
Inpatient Rehabilitation Care Coordinator Assessment and Plan Patient Details  Name: Arthur Smith MRN: [id number] Date of Birth: June 20, 1951  Today's Date: 04/30/2022  Hospital Problems: Principal Problem:   Lumbar myelopathy (HCC) Active Problems:   Acute kidney injury (HCC)   Coronary artery disease involving coronary bypass graft of native heart without angina pectoris   Gastroesophageal reflux disease   Hyperkalemia   Type 2 diabetes mellitus with diabetic polyneuropathy, without long-term current use of insulin (HCC)  Past Medical History:  Past Medical History:  Diagnosis Date   Arthritis    BPH without obstruction/lower urinary tract symptoms    Bronchitis with asthma, acute 11/2011   CAD S/P percutaneous coronary angioplasty 11/15/2018   Severe 2 vessel obstructive CAD:. Ost-pCx 95% (DES PCI  STENT SYNERGY DES 3.5X28) &p-mCx 100% (DES PCI STENT SYNERGY DES 2.25X32). pRCA 90%, mRCA 85% & rPDA 90%.dLAD ~40%.  EF 55 to 60%.  Moderately elevated LVEDP.  No R WMA.  Plan staged PCI to 3 RCA-PDA lesions 4/1   Depression    denies   Diabetes mellitus without complication (HCC)    Type II- Diet controlled   Frequency of urination    URINATION AT NIGHT, PAINFUL SLOW STREAM    GERD (gastroesophageal reflux disease)    occ   Hyperlipidemia    Neuromuscular disorder (HCC)    DIZZINESS   PERIPHERAL NEUROPATHY    Neuropathy    No pertinent past medical history    RINGING RIGHT EAR   NSTEMI (non-ST elevated myocardial infarction) (HCC) 11/13/2018   Recurrent upper respiratory infection (URI)    SOB CURRENT COLD   Shortness of breath    d/t "cold" 12/02/11   Past Surgical History:  Past Surgical History:  Procedure Laterality Date   BACK SURGERY     CARDIAC CATHETERIZATION     CORONARY STENT INTERVENTION N/A 11/15/2018   Procedure: CORONARY STENT INTERVENTION;  Surgeon: Swaziland, Peter M, MD;  Location: [facility];;  Ost-pCx 95% (DES PCI  STENT SYNERGY DES 3.5X28)  &p-mCx 100% (DES PCI STENT SYNERGY DES 2.25X32)   CORONARY STENT INTERVENTION N/A 11/16/2018   Procedure: CORONARY STENT INTERVENTION;  Surgeon: Lyn Records, MD;  Location: [facility];  Service: Cardiovascular;  Laterality: N/A;   FINGER SURGERY     RIGHT MIDDLE FINGER   HERNIA REPAIR     UMBILICAL HERNIA 2011   LEFT HEART CATH AND CORONARY ANGIOGRAPHY N/A 11/15/2018   Procedure: LEFT HEART CATH AND CORONARY ANGIOGRAPHY;  Surgeon: Swaziland, Peter M, MD;  Location: [facility];; Severe 2 vessel obstructive CAD:. Ost-pCx 95% (DES PCI) &p-mCx 100% (DES PCI). pRCA 90%, mRCA 85% & rPDA 90%.dLAD ~40%.  EF 55 to 60%.  Moderately elevated LVEDP.  No R WMA.  -- Plan staged PCI to 3 RCA-PDA lesions 4/1   LUMBAR LAMINECTOMY/DECOMPRESSION MICRODISCECTOMY  12/22/2011   Procedure: LUMBAR LAMINECTOMY/DECOMPRESSION MICRODISCECTOMY 1 LEVEL;  Surgeon: Maeola Harman, MD;  Location: [facility];  Service: Neurosurgery;  Laterality: N/A;  Thoracic ten-eleven laminectomy   LUMBAR LAMINECTOMY/DECOMPRESSION MICRODISCECTOMY Right 05/01/2014   Procedure: Right Lumbar three-four Microdiskectomy;  Surgeon: Maeola Harman, MD;  Location: [facility];  Service: Neurosurgery;  Laterality: Right;   LUMBAR LAMINECTOMY/DECOMPRESSION MICRODISCECTOMY Right 11/08/2014   Procedure: Right L4-5 L5-S1 Laminectomy;  Surgeon: Maeola Harman, MD;  Location: [facility];  Service: Neurosurgery;  Laterality: Right;  Right L4-5 L5-S1 Laminectomy   MULTIPLE TOOTH EXTRACTIONS     Social History:  reports that he  quit smoking about 38 years ago. His smoking use included cigarettes. He has a 30.00 pack-year smoking history. He has never used smokeless tobacco. He reports current alcohol use. He reports that he does not use drugs.  Family / Support Systems Marital Status: Married Patient Roles: Spouse, Parent Spouse/Significant Other: Claris Che [phone] Children: Lhea-daughter [phone]  Emily-daughter Other Supports: Friends  and church members Anticipated Caregiver: wife Ability/Limitations of Caregiver: Wife is a retired Charity fundraiser who worked for [employer] for 20 years Caregiver Availability: 24/7 Family Dynamics: Close with family and extended family,along with friends and church members. He feels he has good supports  Social History Preferred language: English Religion: Catholic Cultural Background: No issues Education: HS Health Literacy - How often do you need to have someone help you when you read instructions, pamphlets, or other written material from your doctor or pharmacy?: Never Writes: Yes Employment Status: Retired Marine scientist Issues: No issues Guardian/Conservator: None-according to MD pt is capable of making his own decisions while here. Wife plans to be here daily to provide support to husband   Abuse/Neglect Abuse/Neglect Assessment Can Be Completed: Yes Physical Abuse: Denies Verbal Abuse: Denies Sexual Abuse: Denies Exploitation of patient/patient's resources: Denies Self-Neglect: Denies  Patient response to: Social Isolation - How often do you feel lonely or isolated from those around you?: Never  Emotional Status Pt's affect, behavior and adjustment status: Pt is motivated to do well but is not feeling well today and is nauseous. He has bee independent and hopes to get back to this level once healed. He is wanting to sleep to see if will feel better when wakes up Recent Psychosocial Issues: other health issues Psychiatric History: No issues seems to be coping appropriately and feels once nasuea passes he will be able to do more Substance Abuse History: No issues  Patient / Family Perceptions, Expectations & Goals Pt/Family understanding of illness & functional limitations: Pt and wife can explain his back surgery and the recovery process. His wife is a retired Charity fundraiser and did work up here on rehab when needed. Both talk with the MD and pt was followed in clinic prior to  admission, so feels aware of treatment plan and waht to expect on this unit Premorbid pt/family roles/activities: Husband, father, retiree, church member, friend, etc Anticipated changes in roles/activities/participation: resume Pt/family expectations/goals: Pt states: " I want to get back to doing for myself but needs to feel beter first."  Wife states: " I will help him but he will need to push himself some."  [employer]: Other (Comment) (Followed in Dr Hendricks Limes) Premorbid Home Care/DME Agencies: Other (Comment) (tub seat) Transportation available at discharge: Both he and wife Is the patient able to respond to transportation needs?: Yes In the past 12 months, has lack of transportation kept you from medical appointments or from getting medications?: No In the past 12 months, has lack of transportation kept you from meetings, work, or from getting things needed for daily living?: No  Discharge Planning Living Arrangements: Spouse/significant other Support Systems: Spouse/significant other, Children, Church/faith community Type of Residence: Private residence Insurance Resources: [employer], Media planner (specify) Administrator) Financial Resources: Tree surgeon, Family Support Financial Screen Referred: No Money Management: Patient, Spouse Does the patient have any problems obtaining your medications?: No Home Management: both Patient/Family Preliminary Plans: Return home with wife who is able to assist and be there when he is discharged home. Both aware of the rehab process and being evaluated today by team and goals  being set. Pt hopes to feel better after resting and less nauseous Care Coordinator Anticipated Follow Up Needs: HH/OP  Clinical Impression Pleasant gentleman who is not feeling well today and needing to rest. His wife was here and is involved and will assist at discharge. Will await team's evaluations and work on discharge  needs.  Elease Hashimoto 04/30/2022, 10:29 AM

## 2022-04-30 NOTE — Plan of Care (Signed)
  Problem: Sit to Stand Goal: LTG:  Patient will perform sit to stand with assistance level (PT) Description: LTG:  Patient will perform sit to stand with assistance level (PT) Flowsheets (Taken 04/30/2022 1613) LTG: PT will perform sit to stand in preparation for functional mobility with assistance level: Independent with assistive device   Problem: RH Bed Mobility Goal: LTG Patient will perform bed mobility with assist (PT) Description: LTG: Patient will perform bed mobility with assistance, with/without cues (PT). Flowsheets (Taken 04/30/2022 1613) LTG: Pt will perform bed mobility with assistance level of: Independent with assistive device    Problem: RH Bed to Chair Transfers Goal: LTG Patient will perform bed/chair transfers w/assist (PT) Description: LTG: Patient will perform bed to chair transfers with assistance (PT). Flowsheets (Taken 04/30/2022 1613) LTG: Pt will perform Bed to Chair Transfers with assistance level: Independent with assistive device    Problem: RH Car Transfers Goal: LTG Patient will perform car transfers with assist (PT) Description: LTG: Patient will perform car transfers with assistance (PT). Flowsheets (Taken 04/30/2022 1613) LTG: Pt will perform car transfers with assist:: Independent with assistive device    Problem: RH Ambulation Goal: LTG Patient will ambulate in home environment (PT) Description: LTG: Patient will ambulate in home environment, # of feet with assistance (PT). Flowsheets (Taken 04/30/2022 1613) LTG: Pt will ambulate in home environ  assist needed:: Independent with assistive device LTG: Ambulation distance in home environment: 50' Goal: LTG Patient will ambulate in community environment (PT) Description: LTG: Patient will ambulate in community environment, # of feet with assistance (PT). Flowsheets (Taken 04/30/2022 1613) LTG: Pt will ambulate in community environ  assist needed:: Independent with assistive device LTG: Ambulation  distance in community environment: 150'   Problem: RH Stairs Goal: LTG Patient will ambulate up and down stairs w/assist (PT) Description: LTG: Patient will ambulate up and down # of stairs with assistance (PT) Flowsheets (Taken 04/30/2022 1613) LTG: Pt will ambulate up/down stairs assist needed:: Independent with assistive device LTG: Pt will  ambulate up and down number of stairs: 5

## 2022-04-30 NOTE — Progress Notes (Signed)
PROGRESS NOTE   Subjective/Complaints: Pt reports nausea that began last night. He had compazine this AM without improvement. Reports prior constipation and had results with enema few days about 2 days ago. He says he had a small BM this AM.   Review of Systems  Constitutional:  Negative for chills and fever.  Eyes:  Negative for double vision.  Respiratory:  Negative for shortness of breath.   Cardiovascular:  Negative for chest pain.  Gastrointestinal:  Positive for abdominal pain, constipation, nausea and vomiting. Negative for diarrhea.  Genitourinary:  Positive for dysuria.  Musculoskeletal:  Positive for back pain.  Neurological:  Positive for weakness. Negative for sensory change and headaches.     Objective:   No results found. Recent Labs    04/29/22 0732 04/30/22 0559  WBC 9.1 8.6  HGB 9.8* 10.4*  HCT 28.6* 30.2*  PLT 192 205   Recent Labs    04/29/22 0732 04/30/22 0559  NA 139 139  K 5.9* 4.5  CL 108 105  CO2 26 25  GLUCOSE 137* 127*  BUN 36* 27*  CREATININE 2.70* 2.01*  CALCIUM 9.0 9.3    Intake/Output Summary (Last 24 hours) at 04/30/2022 1114 Last data filed at 04/30/2022 0745 Gross per 24 hour  Intake 476 ml  Output 2750 ml  Net -2274 ml        Physical Exam: Vital Signs Blood pressure 114/67, pulse 80, temperature 98.5 F (36.9 C), temperature source Oral, resp. rate 19, height 5\' 9"  (1.753 m), weight 87.5 kg, SpO2 96 %.  General: Alert and oriented x 4, No apparent distressHEENT: Head is normocephalic, atraumatic, PERRLA, EOMI, wearing dentures and glasses Neck: Supple without JVD or lymphadenopathy Heart: Reg rate and rhythm. No murmurs rubs or gallops Chest: CTA bilaterally without wheezes, rales, or rhonchi; no distress Abdomen: Soft, mildly-tender, mildly-distended, bowel sounds positive. Extremities: No clubbing, cyanosis, or edema. Pulses are 2+ Psych: Pt's affect is  appropriate. Pt is cooperative, very pleasant Skin: Clean and intact without signs of breakdown, tatoos b/l arms Dressing in place on R great toe from small wound-CDI Honeycomb dressing lumbar incision with mild drainage noted Neuro:  CN 2-12 intact, follows commands, hard of hearing with decreased hearing of finger rub on right, good insight and judgement Decreased sensation to LT in stocking glove distribution b/l LE Strength 5/5 in b/l LE Strength 4/5 b/l LE throughout FTN intact b/l Musculoskeletal: chronic R distal 3rd digit amputation, full ROM, normal tone, No joint swelling noted  Assessment/Plan: 1. Functional deficits which require 3+ hours per day of interdisciplinary therapy in a comprehensive inpatient rehab setting. Physiatrist is providing close team supervision and 24 hour management of active medical problems listed below. Physiatrist and rehab team continue to assess barriers to discharge/monitor patient progress toward functional and medical goals  Care Tool:  Bathing              Bathing assist       Upper Body Dressing/Undressing Upper body dressing        Upper body assist      Lower Body Dressing/Undressing Lower body dressing  Lower body assist       Toileting Toileting    Toileting assist       Transfers Chair/bed transfer  Transfers assist           Locomotion Ambulation   Ambulation assist              Walk 10 feet activity   Assist           Walk 50 feet activity   Assist           Walk 150 feet activity   Assist           Walk 10 feet on uneven surface  activity   Assist           Wheelchair     Assist               Wheelchair 50 feet with 2 turns activity    Assist            Wheelchair 150 feet activity     Assist          Blood pressure 114/67, pulse 80, temperature 98.5 F (36.9 C), temperature source Oral, resp. rate 19, height 5'  9" (1.753 m), weight 87.5 kg, SpO2 96 %.  Medical Problem List and Plan: 1. Functional deficits secondary to lumbar myelopathy s/p interbody fusion 9/8 Dr. Franky Macho             -patient may shower with incisions covered             -ELOS/Goals: 7-10 days Mod I with PT/OT             -Continue CIR, PT/OT 2.  Antithrombotics: -DVT/anticoagulation:  Pharmaceutical: Heparin             -antiplatelet therapy: aspirin 81 mg daily, plavix 3. Pain Management: oxycodone 10 mg q 12 hours; Tylenol, oxycodone as needed             -At home he was using gabapentin, imipramine and Zonegran for peripheral neuropathy related pain 4. Mood/Behavior/Sleep: LCSW to evaluate and provide emotional support             -antipsychotic agents: n/a             -depression>>continue imipramine 5. Neuropsych/cognition: This patient is capable of making decisions on his own behalf. 6. Skin/Wound Care: routine skin care checks             -monitor surgical incision 7. Fluids/Electrolytes/Nutrition: strict Is and Os and follow-up chemistries             -heart healthy/carb modified diet/thins 8:CAD s/p DES 2020: stable without report of symptoms peri-op             -continue aspirin and statin;  restart Plavix             -primary cardiologist Dr. Royann Shivers 9: AKI: improving; follow-up BMP             -Per nephrology avoid hypovolemia, hypotension, nephrotoxins. Also avoid morphine and  baclofen.  -avoid nephrotoxic meds -Cr improved to 2.01 today 9/14 10: Acute encephalopathy resolved: avoid sedating meds 11: Hyperlipidemia: continue Lipitor 80 mg daily 12: Hypertension: home metoprolol being held 13: BPH: continue finasteride 5 mg and tamsulosin 0.4 mg daily             -check PVRs 14: Acute blood loss anemia: H and H trending upward; follow-up CBC             -  continue ferrous sulfate supplementation  -HGB improved to 10.4 today 9/14 15: Hyperkalemia: repeat lab tomorrow 16: DM: continue carb modified diet; A1c  = 5.9; continue CBGs, overall well controlled             -no home meds, continue SSI, will change to sensitive scale 17. GERD continue PPI 18. Nausea  -Will add zofran PRN  -Will get KUB, maybe constipation related    LOS: 1 days A FACE TO FACE EVALUATION WAS PERFORMED  Fanny Dance 04/30/2022, 11:14 AM

## 2022-05-01 DIAGNOSIS — F32A Depression, unspecified: Secondary | ICD-10-CM

## 2022-05-01 DIAGNOSIS — G959 Disease of spinal cord, unspecified: Secondary | ICD-10-CM | POA: Diagnosis not present

## 2022-05-01 DIAGNOSIS — N179 Acute kidney failure, unspecified: Secondary | ICD-10-CM | POA: Diagnosis not present

## 2022-05-01 DIAGNOSIS — E875 Hyperkalemia: Secondary | ICD-10-CM | POA: Diagnosis not present

## 2022-05-01 DIAGNOSIS — R112 Nausea with vomiting, unspecified: Secondary | ICD-10-CM | POA: Diagnosis not present

## 2022-05-01 DIAGNOSIS — K5900 Constipation, unspecified: Secondary | ICD-10-CM

## 2022-05-01 DIAGNOSIS — M792 Neuralgia and neuritis, unspecified: Secondary | ICD-10-CM

## 2022-05-01 LAB — GLUCOSE, CAPILLARY
Glucose-Capillary: 121 mg/dL — ABNORMAL HIGH (ref 70–99)
Glucose-Capillary: 152 mg/dL — ABNORMAL HIGH (ref 70–99)

## 2022-05-01 MED ORDER — GABAPENTIN 300 MG PO CAPS: 300.0000 mg | ORAL_CAPSULE | Freq: Two times a day (BID) | ORAL | 0 refills | Status: DC

## 2022-05-01 MED ORDER — ZONISAMIDE 100 MG PO CAPS
300.0000 mg | ORAL_CAPSULE | Freq: Every day | ORAL | Status: DC
  Filled 2022-05-01: qty 3

## 2022-05-01 MED ORDER — OXYCODONE HCL ER 10 MG PO T12A: 10.0000 mg | EXTENDED_RELEASE_TABLET | Freq: Two times a day (BID) | ORAL | 0 refills | Status: DC

## 2022-05-01 MED ORDER — GABAPENTIN 300 MG PO CAPS: 300.0000 mg | ORAL_CAPSULE | Freq: Two times a day (BID) | ORAL | Status: DC

## 2022-05-01 MED ORDER — FLUTICASONE PROPIONATE 50 MCG/ACT NA SUSP: 2.0000 | Freq: Every day | NASAL | 2 refills | Status: AC | PRN

## 2022-05-01 MED ORDER — POLYETHYLENE GLYCOL 3350 17 G PO PACK: 17.0000 g | PACK | Freq: Two times a day (BID) | ORAL | 0 refills | Status: AC

## 2022-05-01 MED ORDER — IMIPRAMINE HCL 25 MG PO TABS
150.0000 mg | ORAL_TABLET | Freq: Every day | ORAL | Status: DC
  Filled 2022-05-01: qty 6

## 2022-05-01 MED ORDER — METHOCARBAMOL 500 MG PO TABS: 500.0000 mg | ORAL_TABLET | Freq: Four times a day (QID) | ORAL | 0 refills | Status: DC | PRN

## 2022-05-01 MED ORDER — OXYCODONE HCL 5 MG PO TABS: 5.0000 mg | ORAL_TABLET | Freq: Four times a day (QID) | ORAL | 0 refills | Status: DC | PRN

## 2022-05-01 MED ORDER — POLYETHYLENE GLYCOL 3350 17 G PO PACK: 17.0000 g | PACK | Freq: Two times a day (BID) | ORAL | Status: DC

## 2022-05-01 NOTE — Discharge Instructions (Addendum)
Inpatient Rehab Discharge Instructions  Arthur Smith Discharge date and time:  05/01/22  Activities/Precautions/ Functional Status: Activity: no lifting, driving, or strenuous exercise till cleared by MD Diet: regular diet Wound Care: keep wound clean and dry. Contact Dr. Franky Macho if you develop any problems with your incision/wound--redness, swelling, increase in pain, drainage or if you develop fever or chills.     Functional status:  ___ No restrictions     ___ Walk up steps independently _X__ 24/7 supervision/assistance   ___ Walk up steps with assistance ___ Intermittent supervision/assistance  ___ Bathe/dress independently ___ Walk with walker     ___ Bathe/dress with assistance ___ Walk Independently    ___ Shower independently ___ Walk with assistance    _X__ Shower with assistance _X__ No alcohol     ___ Return to work/school ________   Special Instructions: No twisting, bending or arching.   Need to work on constipation.  Pain medication refills from Dr. Franky Macho.    COMMUNITY REFERRALS UPON DISCHARGE:    Home Health:   PT  &  OT                Agency:-[facility] [phone]   Medical Equipment/Items Ordered:ROLLING WALKER & 3 IN 1                                                 Agency/Supplier:[facility]   [phone]   My questions have been answered and I understand these instructions. I will adhere to these goals and the provided educational materials after my discharge from the hospital.  Patient/Caregiver Signature _______________________________ Date __________  Clinician Signature _______________________________________ Date __________  Please bring this form and your medication list with you to all your follow-up doctor's appointments.

## 2022-05-01 NOTE — Progress Notes (Signed)
Physical Therapy Session Note  Patient Details  Name: Arthur Smith MRN: [id number] Date of Birth: 1951/07/07  Today's Date: 05/01/2022 PT Individual Time: 0900-1000 PT Individual Time Calculation (min): 60 min   Short Term Goals: Week 1:  PT Short Term Goal 1 (Week 1): STG=LTG secondary to ELOS  Skilled Therapeutic Interventions/Progress Updates:  Patient greeted supine in bed notably frustrated stating he "wants to go home." Therapist listened intently to patient's frustrations and concerns regarding his inability to sleep last night. Patient still has not been able to have a successful BM despite soap-sud enema yesterday evening. Patietn agreeable to ambulating to the toilet in order to promote BM. Patient transitioned from supine to sitting EOB with the use of bed rails and SBA- verbal cues for proper log-roll techniqe. While sitting EOB, patient was able to don shirt. Patient reported light-headedness/dizziness while sitting EOB and BP was as follows:  Seated EOB- 135/87; HR 78 Seated EOB x2 minutes- 128/101; HR 80 (patient communicating during this vital) Seated EOB x5 minutes- 142/74; HR 74  Patient performed sit/stand from EOB with RW and CGA/MinA for initiating standing- VC for increased anterior weight shift and improved B TKE as patient tends to promote postural extension with B UE on the RW. Patient then ambulated to the bathroom with RW and CGA for safety. Patient was able to doff pants/brief with MinA from therapist. Patient sat on the commode over toilet with B UE support with no LOB noted. Patient was unable to have a BM, however did pass some gas. Patient stood from commode with RW and CGA for safety. While standing, therapist provided [place] for brief/pants management.    Patient gait trained from his room to day room rehab gym (~150') with RW and CGA for safety- VC for improved postual extension, B TKE during stance phase, and wider BOS. Increased time required to complete gait  trial secondary to decreased global strength and impaired endurance/activity tolerance. Once in the rehab gym, patient required an extended seated rest break secondary to fatigue and complaints of nausea.   Patient wheeled back to his room for time management and due to symptoms. Once in his room, patient ambulated ~10' from wheelchair to bedside recliner with RW and CGA. Patient required increased time to stand from wc for gait trial secondary to fatigue- MAX verbal cues for improved B TKE for improved postural extension.   Patient left sitting in bedside recliner with call bell nearby, tray table in front, B LE elevated and all needs met.     Therapy Documentation Precautions:  Precautions Precautions: Fall, Back Precaution Comments: Spinal precautions- Unable to recall precautions at this time- Wife able to cue patient, however required assistance for 2/3 precautions Required Braces or Orthoses:  (No back brace required) Restrictions Weight Bearing Restrictions: No   Therapy/Group: Individual Therapy  Merit Maybee 05/01/2022, 7:51 AM

## 2022-05-01 NOTE — Progress Notes (Signed)
Occupational Therapy Session Note  Patient Details  Name: Arthur Smith MRN: [id number] Date of Birth: Aug 24, 1950  Today's Date: 05/01/2022 OT Individual Time: 4696-2952 OT Individual Time Calculation (min): 76 min    Short Term Goals: Week 1:  OT Short Term Goal 1 (Week 1): STG = LTG (due to ELOS)  Skilled Therapeutic Interventions/Progress Updates:  Pt awake in bed upon OT arrival to the room. Pt reports, "Oh, I'm going home today." Pt adamant about DC today. Pt in agreement for OT session.  Therapy Documentation Precautions:  Precautions Precautions: Fall, Back Precaution Comments: Spinal precautions- Unable to recall precautions at this time- Wife able to cue patient, however required assistance for 2/3 precautions Required Braces or Orthoses:  (No back brace required) Restrictions Weight Bearing Restrictions: No Vital Signs: Please see "Flowsheet" for most recent vitals charted by nursing staff.  Pain: Pain Assessment Pain Scale: 0-10 Pain Score: 0-No pain  ADL: Grooming: Contact guard (Pt able to perofrm hand hygiene standing at the sink with CGA with use of FWW.) Where Assessed-Grooming: Standing at sink Toileting:  (Pt attempted to have a BM, however, unable at this time. Pt able to perform clothing management with CGA and use of FWW for stability. Education provided for technique to alternate hands on FWW and managing clothing.) Where Assessed-Toileting: Bedside Commode, Toilet Toilet Transfer: Minimal assistance (Pt able to perform ambulatory transfer from EOB > toilet with BSC placed over the toilet with minimal assistance with use of FWW and minimal assist to control descend to sit on toilet and for force production to stand.) Toilet Transfer Method: Ambulating ADL Comments: Pt able to perofrm ambulatory transfer from EOB > BSC placed over toilet > BSC > w/c > EOB with minimal assistance with use of FWW. Pt able to perform functional ambulation in the hallway from  room > ~50% of length to therapy gym ~150' with CGA with use of FWW. OT provides recommendation of obtaining a tub-transfer bench to improve safety with tub-shower transfer and provided skilled demo to pt and spouse on transfer techniques. OT also provides recommendation to obtain a reacher, sock aid, and LH sponge to maintan spinal precautions with ADLs. Pt and spouse verbalize understanding.  Stair Training: Pt's spouse enters halway through session and reports concerns for stairs at home. OT takes pt to gym to practice stairs. Pt able to ascend/descend 6" stairs with minimal assistance and use of B hand rails. Pt require VC's for proper stair techniques ("up with the good, down with the bad") d/t pt reporting increased weakness in RLE. Pt's spouse visualized stairs and reported no concerns for providing adequate assistance to pt, however, pt reports increased fatigue and did not want to perform stairs again for spouse to assist. OT participated in problem solving with pt and spouse on safest option to use stairs to get into the house as pt is unable to reach B hand rails for entry to front door. However, pt and spouse report that pt is able to reach B handrails to on the porch and this entry to house also has 5 hand rails. Pt returned to room from OT via w/c for time management and perform ambulatory transfer from w/c > EOB with minimal assistance.   Pt returned to bed at end of session. Pt left resting comfortably in bed with personal belongings and call light within reach, bed alarm on and activated, bed in low position, 3 bed rails up, spouse and physician present in room, and comfort needs attended  to.   Therapy/Group: Individual Therapy  Lavera Guise 05/01/2022, 4:52 PM

## 2022-05-01 NOTE — IPOC Note (Signed)
Overall Plan of Care [facility]) Patient Details Name: Arthur Smith MRN: [id number] DOB: 07-06-1951  Admitting Diagnosis: Lumbar myelopathy [facility])  Hospital Problems: Principal Problem:   Lumbar myelopathy (HCC) Active Problems:   Acute kidney injury (HCC)   Coronary artery disease involving coronary bypass graft of native heart without angina pectoris   Gastroesophageal reflux disease   Hyperkalemia   Type 2 diabetes mellitus with diabetic polyneuropathy, without long-term current use of insulin (HCC)     Functional Problem List: Nursing Bladder, Bowel, Pain, Edema, Safety, Endurance, Sensory, Medication Management, Skin Integrity, Motor  PT Balance, Pain, Safety, Endurance, Motor, Sensory, Skin Integrity  OT Balance, Safety, Pain, Endurance  SLP    TR         Basic ADL's: OT Grooming, Bathing, Dressing, Toileting     Advanced  ADL's: OT Simple Meal Preparation     Transfers: PT Bed Mobility, Bed to Chair, Customer service manager, Tub/Shower     Locomotion: PT Ambulation, Stairs     Additional Impairments: OT    SLP        TR      Anticipated Outcomes Item Anticipated Outcome  Self Feeding    Swallowing      Basic self-care  Mod I  Toileting  Mod I   Bathroom Transfers Mod I  Bowel/Bladder  continent x 2 with regular bowel movements  Transfers  ModI with RW  Locomotion  ModI with RW, household distances  Communication     Cognition     Pain  less than 3  Safety/Judgment  remain fall free while in rehab   Therapy Plan: PT Intensity: Minimum of 1-2 x/day ,45 to 90 minutes PT Frequency: 5 out of 7 days PT Duration Estimated Length of Stay: 7-10 days OT Intensity: Minimum of 1-2 x/day, 45 to 90 minutes OT Frequency: 5 out of 7 days OT Duration/Estimated Length of Stay: 7-10 days     Team Interventions: Nursing Interventions Patient/Family Education, Disease Management/Prevention, Skin Care/Wound Management, Discharge Planning, Bladder Management,  Pain Management, Bowel Management, Medication Management  PT interventions Ambulation/gait training, Cognitive remediation/compensation, Discharge planning, DME/adaptive equipment instruction, Functional mobility training, Pain management, Psychosocial support, Splinting/orthotics, Therapeutic Activities, UE/LE Strength taining/ROM, Visual/perceptual remediation/compensation, Warden/ranger, Community reintegration, Disease management/prevention, Neuromuscular re-education, Patient/family education, Skin care/wound management, Stair training, Therapeutic Exercise, UE/LE Coordination activities  OT Interventions Warden/ranger, Neuromuscular re-education, Cognitive remediation/compensation, DME/adaptive equipment instruction, Pain management, Community reintegration, Discharge planning, Functional mobility training, Therapeutic Activities, UE/LE Coordination activities, Patient/family education, Functional electrical stimulation, UE/LE Strength taining/ROM, Self Care/advanced ADL retraining, Therapeutic Exercise  SLP Interventions    TR Interventions    SW/CM Interventions Discharge Planning, Psychosocial Support, Patient/Family Education   Barriers to Discharge MD  Medical stability and Home enviroment access/loayout  Nursing Decreased caregiver support, Home environment access/layout, Incontinence, Wound Care, Weight, Weight bearing restrictions lives with spouse in house 1 level with 5 ste and rails on both sides  PT Insurance for SNF coverage    OT Other (comments) spinal restrictions  SLP      SW       Team Discharge Planning: Destination: PT-Home ,OT- Home , SLP-  Projected Follow-up: PT-Home health PT, OT-  None, SLP-  Projected Equipment Needs: PT-To be determined, OT- None recommended by OT, SLP-  Equipment Details: PT-TBD, OT-  Patient/family involved in discharge planning: PT- Patient,  OT-Patient, SLP-   MD ELOS: 7-10 was anticipated Medical Rehab  Prognosis:  Excellent Assessment: The patient has been admitted for  CIR therapies with the diagnosis of  lumbar myelopathy s/p interbody fusion 9/8 Dr. Franky Macho. The team will be addressing functional mobility, strength, stamina, balance, safety, adaptive techniques and equipment, self-care, bowel and bladder mgt, patient and caregiver education. Goals have been set at Mod I with PT/OT. Anticipated discharge destination is home.   PT went home AMA today        See Team Conference Notes for weekly updates to the plan of care

## 2022-05-01 NOTE — Progress Notes (Signed)
Occupational Therapy Discharge Summary  Patient Details  Name: Arthur Smith MRN: [id number] Date of Birth: [birth year]  Date of Discharge from OT service:May 01, 2022   Patient has met 0 of 6 long term goals due to  pt signing out AMA. Pt continues to present with increased BLE/generalized weakness, decreased endurance, and high pain levels .  Patient to discharge at [facility] Assist - supervision level.  Patient's care partner able to visualize partial OT session, however, reports no concerns for DC. Unable to complete formal family training as pt decided to DC AMA.   Reasons goals not met: Pt DC AMA from hospital. Pt continues to requires minimal assist - supervision for ADLs secondary to BLE/generalized weakness, decreased endurance, and pain levels.   Recommendation:  Patient will benefit from ongoing skilled OT services in home health setting to continue to advance functional skills in the area of BADL.  Equipment: Tub transfer bench, BSC, FWW, sock aid, reacher, LH sponge, and shoe horn  Reasons for discharge: discharge from hospital (pt DC AMA)  Patient/family agrees with progress made and goals achieved: Yes (However, pt DC AMA)  OT Discharge Precautions/Restrictions  Precautions Precautions: Fall;Back Restrictions Weight Bearing Restrictions: No Vital Signs Please see "Flowsheet" for most recent vitals charted by nursing staff.  Pain Last pain reported during OT session 7/10 pain ADL (most recent documentation from OT treatment sessions) ADL Eating: Modified independent Where Assessed-Eating: Wheelchair Grooming: Contact guard (Pt able to perofrm hand hygiene standing at the sink with CGA with use of FWW.) Where Assessed-Grooming: Standing at sink Upper Body Bathing: Setup Where Assessed-Upper Body Bathing: Wheelchair Lower Body Bathing: Minimal assistance Where Assessed-Lower Body Bathing: Standing at sink, Sitting at sink Upper Body Dressing:  Supervision/safety Where Assessed-Upper Body Dressing: Wheelchair Lower Body Dressing: Minimal assistance Where Assessed-Lower Body Dressing: Wheelchair Toileting:  (Pt attempted to have a BM, however, unable at this time. Pt able to perform clothing management with CGA and use of FWW for stability. Education provided for technique to alternate hands on FWW and managing clothing.) Where Assessed-Toileting: Bedside Commode, Toilet Toilet Transfer: Minimal assistance (Pt able to perform ambulatory transfer from EOB > toilet with BSC placed over the toilet with minimal assistance with use of FWW and minimal assist to control descend to sit on toilet and for force production to stand.) Toilet Transfer Method: Ambulating Tub/Shower Transfer: Not assessed Social research officer, government: Not assessed ADL Comments: Pt able to perofrm ambulatory transfer from EOB > BSC placed over toilet > BSC > w/c > EOB with minimal assistance with use of FWW. Pt able to perform functional ambulation in the hallway from room > ~50% of length to therapy gym ~150' with CGA with use of FWW. OT provides recommendation of obtaining a tub-transfer bench to improve safety with tub-shower transfer and provided skilled demo to pt and spouse on transfer techniques. OT also provides recommendation to obtain a reacher, sock aid, and LH sponge to maintan spinal precautions with ADLs. Pt and spouse verbalize understanding. Vision Baseline Vision/History: 1 Wears glasses Patient Visual Report: No change from baseline Vision Assessment?: No apparent visual deficits Perception  Perception: Within Functional Limits Praxis Praxis: Intact Cognition Cognition Overall Cognitive Status: Impaired/Different from baseline Arousal/Alertness: Awake/alert Memory: Impaired Awareness: Appears intact Problem Solving: Impaired Behaviors: Poor frustration tolerance Safety/Judgment: Impaired Brief Interview for Mental Status (BIMS) Repetition of Three  Words (First Attempt): 3 Temporal Orientation: Year: Correct Temporal Orientation: Month: Accurate within 5 days Temporal Orientation: Day: Correct Recall: "Sock": Yes,  after cueing ("something to wear") Recall: "Blue": Yes, no cue required Recall: "Bed": Yes, no cue required BIMS Summary Score: 14 Sensation Sensation Light Touch: Impaired by gross assessment Peripheral sensation comments: BLE diabetic neuropathy- Mid calf to feet; LE sensation is more dull compared to UE sensation Light Touch Impaired Details: Impaired RLE;Impaired LLE;Absent RLE;Absent LLE Hot/Cold: Appears Intact Proprioception: Appears Intact Stereognosis: Appears Intact Coordination Gross Motor Movements are Fluid and Coordinated: Yes Fine Motor Movements are Fluid and Coordinated: Yes Motor  Motor Motor: Other (comment) Motor - Skilled Clinical Observations: Global weakness- Especially in B quads limiting knee extension while in standing. Mobility  Bed Mobility Bed Mobility: Sit to Supine Rolling Right: Minimal Assistance - Patient > 75% Rolling Left: Minimal Assistance - Patient > 75% Supine to Sit: Minimal Assistance - Patient > 75% Sit to Supine: Minimal Assistance - Patient > 75% Transfers Sit to Stand: Minimal Assistance - Patient > 75%  Trunk/Postural Assessment  Cervical Assessment Cervical Assessment: Within Functional Limits Thoracic Assessment Thoracic Assessment: Exceptions to [facility] Lumbar Assessment Lumbar Assessment: Exceptions to [facility] Postural Control Postural Control: Deficits on evaluation  Balance Balance Balance Assessed: Yes Static Sitting Balance Static Sitting - Balance Support: Bilateral upper extremity supported;Feet supported Static Sitting - Level of Assistance: 5: Stand by assistance Dynamic Sitting Balance Dynamic Sitting - Balance Support: Feet supported Dynamic Sitting - Level of Assistance:  (CGA) Static Standing Balance Static Standing - Balance Support: Bilateral  upper extremity supported Static Standing - Level of Assistance: 4: Min assist Dynamic Standing Balance Dynamic Standing - Balance Support: Bilateral upper extremity supported;During functional activity Dynamic Standing - Level of Assistance: 4: Min assist Extremity/Trunk Assessment RUE Assessment RUE Assessment: Within Functional Limits General Strength Comments: Unable to fully assess due to spinal precautions LUE Assessment LUE Assessment: Within Functional Limits General Strength Comments: unable to fully assess due to spinal precautions   Barbee Shropshire 05/01/2022, 5:08 PM

## 2022-05-01 NOTE — Progress Notes (Signed)
PROGRESS NOTE   Subjective/Complaints: Patient reports he had minimal results with Enema last night. He got sorbitol last night. He reports his pain improved when he lowered his legs in bed. He says he did not sleep well and had nightmares.  Pt is unhappy eating hospital food, he was told he could bring in food from home.   Review of Systems  Constitutional:  Negative for chills and fever.  Eyes:  Negative for double vision.  Respiratory:  Negative for shortness of breath.   Cardiovascular:  Negative for chest pain.  Gastrointestinal:  Positive for abdominal pain, constipation, nausea and vomiting. Negative for diarrhea.  Genitourinary:  Positive for dysuria.  Musculoskeletal:  Positive for back pain.  Neurological:  Positive for weakness. Negative for sensory change and headaches.  Psychiatric/Behavioral:  The patient is nervous/anxious.      Objective:   DG Abd 2 Views  Result Date: 04/30/2022 CLINICAL DATA:  Constipation, post lumbar surgery EXAM: ABDOMEN - 2 VIEW COMPARISON:  12/02/2019 FINDINGS: Increased stool throughout colon. Prior lumbar fusion L3-S1. Multilevel degenerative disc disease changes lumbar spine. Few scattered pelvic phleboliths. No urinary tract calcifications. IMPRESSION: Increased stool throughout colon. Electronically Signed   By: Ulyses Southward M.D.   On: 04/30/2022 15:49   Recent Labs    04/29/22 0732 04/30/22 0559  WBC 9.1 8.6  HGB 9.8* 10.4*  HCT 28.6* 30.2*  PLT 192 205    Recent Labs    04/29/22 0732 04/30/22 0559  NA 139 139  K 5.9* 4.5  CL 108 105  CO2 26 25  GLUCOSE 137* 127*  BUN 36* 27*  CREATININE 2.70* 2.01*  CALCIUM 9.0 9.3     Intake/Output Summary (Last 24 hours) at 05/01/2022 0836 Last data filed at 05/01/2022 0723 Gross per 24 hour  Intake 478 ml  Output 2200 ml  Net -1722 ml         Physical Exam: Vital Signs Blood pressure 139/82, pulse 82, temperature 98.1 F  (36.7 C), resp. rate 18, height 5\' 9"  (1.753 m), weight 87.5 kg, SpO2 100 %.  General: Alert and oriented x 4, No apparent distressHEENT: Head is normocephalic, atraumatic, PERRLA, EOMI, wearing dentures and glasses Neck: Supple without JVD or lymphadenopathy Heart: Reg rate and rhythm. No murmurs rubs or gallops Chest: CTA bilaterally without wheezes, rales, or rhonchi; no distress Abdomen: Soft, mildly-tender, distended, bowel sounds positive. Extremities: No clubbing, cyanosis, or edema. Pulses are 2+ Psych: Pt's affect is appropriate. 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 Functional deficits which require 3+ hours per day of interdisciplinary therapy in a comprehensive inpatient rehab setting. Physiatrist is providing close team supervision and 24 hour management of active medical  problems listed below. Physiatrist and rehab team continue to assess barriers to discharge/monitor patient progress toward functional and medical goals  Care Tool:  Bathing    Body parts bathed by patient: Right arm, Left arm, Chest, Abdomen, Front perineal area, Buttocks, Right upper leg, Left upper leg, Face   Body parts bathed by helper: Right lower leg, Left lower leg     Bathing assist Assist Level: Minimal Assistance - Patient > 75%     Upper Body Dressing/Undressing Upper body dressing   What is the patient wearing?: Pull over shirt    Upper body assist Assist Level:  Supervision/Verbal cueing    Lower Body Dressing/Undressing Lower body dressing      What is the patient wearing?: Pants, Underwear/pull up     Lower body assist Assist for lower body dressing: Minimal Assistance - Patient > 75%     Toileting Toileting    Toileting assist Assist for toileting: Minimal Assistance - Patient > 75%     Transfers Chair/bed transfer  Transfers assist     Chair/bed transfer assist level: Minimal Assistance - Patient > 75%     Locomotion Ambulation   Ambulation assist      Assist level: Minimal Assistance - Patient > 75% Assistive device: Walker-rolling Max distance: 90   Walk 10 feet activity   Assist     Assist level: Minimal Assistance - Patient > 75% Assistive device: Walker-rolling   Walk 50 feet activity   Assist    Assist level: Minimal Assistance - Patient > 75% Assistive device: Walker-rolling    Walk 150 feet activity   Assist Walk 150 feet activity did not occur: Safety/medical concerns (Unable to ambulate >90' at this time secondary to fatigue/nausea/poor endurance/activity tolerance)         Walk 10 feet on uneven surface  activity   Assist     Assist level: Minimal Assistance - Patient > 75% Assistive device: Walker-rolling   Wheelchair     Assist Is the patient using a wheelchair?: No             Wheelchair 50 feet with 2 turns activity    Assist            Wheelchair 150 feet activity     Assist          Blood pressure 139/82, pulse 82, temperature 98.1 F (36.7 C), resp. rate 18, height 5\' 9"  (1.753 m), weight 87.5 kg, SpO2 100 %.  Medical Problem List and Plan: 1. Functional deficits secondary to lumbar myelopathy s/p interbody fusion 9/8 Dr. 11/8             -patient may shower with incisions covered             -ELOS/Goals: 7-10 days Mod I with PT/OT             -Continue CIR, PT/OT  -Possible AMA discharge today, will discuss further when his wife  arrives 2.  Antithrombotics: -DVT/anticoagulation:  Pharmaceutical: Heparin             -antiplatelet therapy: aspirin 81 mg daily, plavix 3. Pain Management: oxycodone 10 mg q 12 hours; Tylenol, oxycodone as needed             -At home he was using gabapentin, imipramine and Zonegran for peripheral neuropathy related pain  -9/15 Restart home imipramine and zonegran, stop trazadone 4. Mood/Behavior/Sleep: LCSW to evaluate and provide emotional support             -  antipsychotic agents: n/a             -depression>>9/15 restart imipramine- may help with his poor sleep and nightmares  5. Neuropsych/cognition: This patient is capable of making decisions on his own behalf. 6. Skin/Wound Care: routine skin care checks             -monitor surgical incision 7. Fluids/Electrolytes/Nutrition: strict Is and Os and follow-up chemistries             -heart healthy/carb modified diet/thins 8:CAD s/p DES 2020: stable without report of symptoms peri-op             -continue aspirin and statin;  restart Plavix             -primary cardiologist Dr. Royann Shivers 9: AKI: improving; follow-up BMP             -Per nephrology avoid hypovolemia, hypotension, nephrotoxins. Also avoid morphine and  baclofen.  -avoid nephrotoxic meds -Cr improved to 2.01 today 9/14 10: Acute encephalopathy resolved: avoid sedating meds 11: Hyperlipidemia: continue Lipitor 80 mg daily 12: Hypertension: home metoprolol being held 13: BPH: continue finasteride 5 mg and tamsulosin 0.4 mg daily             -check PVRs 14: Acute blood loss anemia: H and H trending upward; follow-up CBC             -continue ferrous sulfate supplementation  -HGB improved to 10.4 today 9/14   15: Hyperkalemia:  K+ 4.5 9/14 stable 16: DM: continue carb modified diet; A1c = 5.9; continue CBGs, overall well controlled             -no home meds, continue SSI, will change to sensitive scale  -9/15 CBGs well controlled 17. GERD continue PPI 18.  Nausea/constipation  -Will add zofran PRN  -KUB with increased stool  -S/P enema and sorbitol, if he is not leaving AMA will order additional laxatives    LOS: 2 days A FACE TO FACE EVALUATION WAS PERFORMED  Fanny Dance 05/01/2022, 8:36 AM

## 2022-05-01 NOTE — Progress Notes (Signed)
Inpatient Rehabilitation Care Coordinator Discharge Note   Patient Details  Name: Arthur Smith MRN: [id number] Date of Birth: [birth year]   Discharge location: HOME WITH WIFE WHO IS ABLE TO PROVIDE 24/7 CARE  Length of Stay:  2 DAYS  Discharge activity level: SUPERVISION-MIN LEVEL  Home/community participation: ACTIVE  Patient response UU:VOZDGU Literacy - How often do you need to have someone help you when you read instructions, pamphlets, or other written material from your doctor or pharmacy?: Never  Patient response YQ:[id number] Isolation - How often do you feel lonely or isolated from those around you?: Never  Services provided included: MD, RD, PT, OT, RN, CM, Pharmacy, SW  Financial Services:  Financial Services Utilized: Medicare    Choices offered to/list presented to: PT AND WIFE  Follow-up services arranged:  Home Health, DME, Patient/Family has no preference for HH/DME agencies Home Health Agency: [facility]  PT & OT    DME : [facility]-ROLLING WALKER AND 3 IN 1    Patient response to transportation need: Is the patient able to respond to transportation needs?: Yes In the past 12 months, has lack of transportation kept you from medical appointments or from getting medications?: No In the past 12 months, has lack of transportation kept you from meetings, work, or from getting things needed for daily living?: No    Comments (or additional information):PT INSISTING ON GOING HOME AND WIFE REPORTS BEING A RETIRED RN CAN MANGE HIM AT HOME. ISSUE IS GETTING UP 5 STEPS INTO HOME.  Patient/Family verbalized understanding of follow-up arrangements:  Yes  Individual responsible for coordination of the follow-up plan: Arthur Smith  [phone]  Confirmed correct DME delivered: Arthur Smith 05/01/2022    Arthur Smith, Arthur Smith

## 2022-05-01 NOTE — Progress Notes (Signed)
Patient ID: Arthur Smith, male   DOB: 11/22/1950, 71 y.o.   MRN: [id number] Met with the patient to review current situation, team conference , rehab process and plan of care. Patient is anxious to go home and reports that wife can assist him. Reviewed secondary risk management including pain, back precautions, medications. Constipation addressed with po medication post SSE. Sutures to back; no drainage reported. Continue to follow along to discharge to address educational needs to facilitate preparation for discharge home. Margarito Liner

## 2022-05-01 NOTE — Progress Notes (Addendum)
Patient ID: Arthur Smith, male   DOB: [birth year], 71 y.o.   MRN: [id number]  [place]-PT reports pt is insisting on discharging today. Called wife to see if she is in agreement with this and she reports he needs to get up five steps into their home, but otherwise she can manage him. She is a former Clinical research associate and is aware of his bowel issues. She feels she can manage this at home. Have ordered rolling walker and 3 in 1 for home and will make referral for home health they express no preference. Await medical input  12:30 PM Met with pt who reports feeling ok now while eating lunch. MD reports if pt wants to leave today he will need to sign out AMA,unless something changes when wife arrives. Has received the rolling walker and 3 in 1 ordered this am  1:10 PM Referral made to [facility] for HHPT and OT. Will let know when DC date. According to MD pt is leaving AMA and wife is in agreement with this. Wife went to therapy with pt the past 45 minutes

## 2022-05-01 NOTE — Progress Notes (Signed)
Occupational Therapy Session Note  Patient Details  Name: Arthur Smith MRN: [id number] Date of Birth: 02-15-1951  Today's Date: 05/01/2022 OT Individual Time: 1025-1140 OT Individual Time Calculation (min): 75 min    Short Term Goals: Week 1:  OT Short Term Goal 1 (Week 1): STG = LTG (due to ELOS)  Skilled Therapeutic Interventions/Progress Updates:      Therapy Documentation Precautions:  Precautions Precautions: Fall, Back Precaution Comments: Spinal precautions- Unable to recall precautions at this time- Wife able to cue patient, however required assistance for 2/3 precautions Required Braces or Orthoses:  (No back brace required) Restrictions Weight Bearing Restrictions: No General:Patient seen for self care and education on precautions and home safety. Patient insistent on discharging today, but agreeable to ADL's Patient tolerated ADL's as listed below. Followed self care with education on safety and home modifications. Patient reports feeling good with his precautions and states he has ordered a reacher for obtaining items off the floor and to assist with dressing if needed. Patient also has a RW and 3-1 commode in his room. Patient      Pain: Pain Assessment Pain Scale: 0-10 Pain Score: 5  Pain Type: Acute pain Pain Location: Back ADL: ADL Eating: Modified independent Where Assessed-Eating: Wheelchair Grooming: Setup Where Assessed-Grooming: Wheelchair, Sitting at sink Upper Body Bathing: Setup Where Assessed-Upper Body Bathing: shower, seated Lower Body Bathing: Minimal assistance Where Assessed-Lower Body Bathing: shower, seated on bench Upper Body Dressing: Supervision/safety Where Assessed-Upper Body Dressing: Wheelchair Lower Body Dressing: Minimal assistance Where Assessed-Lower Body Dressing: Wheelchair Toileting: CGA Where Assessed-Toileting: Bedside Commode over Actuary Transfer: Minimal assistance,  Toilet Transfer Method: Stand  pivot Tub/Shower Transfer: CGA Walk-In Shower Transfer: CGA with RW to shower bench Vision- Glasses    Balance- needs RW for standing balance     Other Treatments:  Patient educated on precautions and home safety. Reports wife will be at home with him to assist with self care tasks that the therapist assisted with initially. Patient continues to report wanting to go home so he can rest. Reports having had nightmares last night and feeling exhausted. Assisted back to bed. Patient able to follow log roll with only initial verbal instruction.    Therapy/Group: Individual Therapy  Warnell Forester 05/01/2022, 11:58 AM

## 2022-05-04 ENCOUNTER — Ambulatory Visit: Payer: 59 | Admitting: Physical Medicine and Rehabilitation

## 2022-05-04 DIAGNOSIS — F32A Depression, unspecified: Secondary | ICD-10-CM | POA: Diagnosis not present

## 2022-05-04 DIAGNOSIS — Z6826 Body mass index (BMI) 26.0-26.9, adult: Secondary | ICD-10-CM | POA: Diagnosis not present

## 2022-05-04 DIAGNOSIS — E669 Obesity, unspecified: Secondary | ICD-10-CM | POA: Diagnosis not present

## 2022-05-04 DIAGNOSIS — R339 Retention of urine, unspecified: Secondary | ICD-10-CM | POA: Diagnosis not present

## 2022-05-04 DIAGNOSIS — E1142 Type 2 diabetes mellitus with diabetic polyneuropathy: Secondary | ICD-10-CM | POA: Diagnosis not present

## 2022-05-04 DIAGNOSIS — I959 Hypotension, unspecified: Secondary | ICD-10-CM | POA: Diagnosis not present

## 2022-05-04 DIAGNOSIS — D62 Acute posthemorrhagic anemia: Secondary | ICD-10-CM | POA: Diagnosis not present

## 2022-05-04 DIAGNOSIS — Z4789 Encounter for other orthopedic aftercare: Secondary | ICD-10-CM | POA: Diagnosis not present

## 2022-05-04 DIAGNOSIS — Z981 Arthrodesis status: Secondary | ICD-10-CM | POA: Diagnosis not present

## 2022-05-04 DIAGNOSIS — K5900 Constipation, unspecified: Secondary | ICD-10-CM

## 2022-05-04 DIAGNOSIS — Z7982 Long term (current) use of aspirin: Secondary | ICD-10-CM | POA: Diagnosis not present

## 2022-05-04 DIAGNOSIS — I252 Old myocardial infarction: Secondary | ICD-10-CM | POA: Diagnosis not present

## 2022-05-04 DIAGNOSIS — Z87891 Personal history of nicotine dependence: Secondary | ICD-10-CM | POA: Diagnosis not present

## 2022-05-04 DIAGNOSIS — I2510 Atherosclerotic heart disease of native coronary artery without angina pectoris: Secondary | ICD-10-CM | POA: Diagnosis not present

## 2022-05-04 DIAGNOSIS — N401 Enlarged prostate with lower urinary tract symptoms: Secondary | ICD-10-CM | POA: Diagnosis not present

## 2022-05-04 DIAGNOSIS — J45909 Unspecified asthma, uncomplicated: Secondary | ICD-10-CM | POA: Diagnosis not present

## 2022-05-04 DIAGNOSIS — N138 Other obstructive and reflux uropathy: Secondary | ICD-10-CM | POA: Diagnosis not present

## 2022-05-04 DIAGNOSIS — M4716 Other spondylosis with myelopathy, lumbar region: Secondary | ICD-10-CM | POA: Diagnosis not present

## 2022-05-04 DIAGNOSIS — M48061 Spinal stenosis, lumbar region without neurogenic claudication: Secondary | ICD-10-CM | POA: Diagnosis not present

## 2022-05-04 DIAGNOSIS — Z7951 Long term (current) use of inhaled steroids: Secondary | ICD-10-CM | POA: Diagnosis not present

## 2022-05-04 DIAGNOSIS — Z9181 History of falling: Secondary | ICD-10-CM | POA: Diagnosis not present

## 2022-05-04 NOTE — Discharge Summary (Signed)
Physical Therapy Discharge Note  This patient was unable to complete the inpatient rehab program due to pain and frustrations regarding having to stay in the hospital to receive his therapy and continued care- Patient presents with poor frustration tolerance; therefore did not meet their long term goals. Pt left the program at a CGA/MinA assist level for their functional mobility/ transfers. This patient is being discharged from PT services at this time.  Pt's perception of pain in the last five days was unable to answer at this time.    See CareTool for functional status details  If the patient is able to return to inpatient rehabilitation within 3 midnights, this may be considered an interrupted stay and therapy services will resume as ordered. Modification and reinstatement of their goals will be made upon completion of therapy service reevaluations.

## 2022-05-04 NOTE — Progress Notes (Signed)
Inpatient Rehabilitation Discharge Medication Review by a Pharmacist  A complete drug regimen review was completed for this patient to identify any potential clinically significant medication issues.  High Risk Drug Classes Is patient taking? Indication by Medication  Antipsychotic No   Anticoagulant No   Antibiotic No   Opioid Yes Oxycodone IR, OxyContin - pain  Antiplatelet Yes ASA, Plavix - CAD  Hypoglycemics/insulin No   Vasoactive Medication Yes Nitroglycerin - prn chest pain Tamsulosin - BPH  Chemotherapy No   Other Yes Imipramine - depression Gabapentin - pain Atorvastatin - HLD Iron tab - anemia Robaxin - muscle relaxant Protonix - GERD Finasteride - BPH     Type of Medication Issue Identified Description of Issue Recommendation(s)  Drug Interaction(s) (clinically significant)     Duplicate Therapy     Allergy     No Medication Administration End Date     Incorrect Dose     Additional Drug Therapy Needed     Significant med changes from prior encounter (inform family/care partners about these prior to discharge).    Other       Clinically significant medication issues were identified that warrant physician communication and completion of prescribed/recommended actions by midnight of the next day:  No  Name of provider notified for urgent issues identified:   Provider Method of Notification:   Pharmacist comments: patient left AMA on 05/01/22  Time spent performing this drug regimen review (minutes):  [place], PharmD, BCPS 05/04/2022 9:05 AM

## 2022-05-08 ENCOUNTER — Emergency Department (HOSPITAL_BASED_OUTPATIENT_CLINIC_OR_DEPARTMENT_OTHER)
Admission: EM | Admit: 2022-05-08 | Discharge: 2022-05-08 | Disposition: A | Discharge status: Home / Self Care | Payer: Medicare Other | Attending: Emergency Medicine | Admitting: Emergency Medicine

## 2022-05-08 ENCOUNTER — Encounter (HOSPITAL_BASED_OUTPATIENT_CLINIC_OR_DEPARTMENT_OTHER): Payer: Self-pay

## 2022-05-08 ENCOUNTER — Other Ambulatory Visit: Payer: Self-pay

## 2022-05-08 DIAGNOSIS — M79604 Pain in right leg: Secondary | ICD-10-CM | POA: Diagnosis not present

## 2022-05-08 DIAGNOSIS — Z7982 Long term (current) use of aspirin: Secondary | ICD-10-CM | POA: Insufficient documentation

## 2022-05-08 DIAGNOSIS — E119 Type 2 diabetes mellitus without complications: Secondary | ICD-10-CM | POA: Diagnosis not present

## 2022-05-08 DIAGNOSIS — M79605 Pain in left leg: Secondary | ICD-10-CM | POA: Diagnosis not present

## 2022-05-08 DIAGNOSIS — I2510 Atherosclerotic heart disease of native coronary artery without angina pectoris: Secondary | ICD-10-CM | POA: Diagnosis not present

## 2022-05-08 DIAGNOSIS — K5900 Constipation, unspecified: Secondary | ICD-10-CM | POA: Insufficient documentation

## 2022-05-08 DIAGNOSIS — N189 Chronic kidney disease, unspecified: Secondary | ICD-10-CM | POA: Insufficient documentation

## 2022-05-08 LAB — CBC
HCT: 30.9 % — ABNORMAL LOW (ref 39.0–52.0)
Hemoglobin: 10.2 g/dL — ABNORMAL LOW (ref 13.0–17.0)
MCH: 31.4 pg (ref 26.0–34.0)
MCHC: 33 g/dL (ref 30.0–36.0)
MCV: 95.1 fL (ref 80.0–100.0)
Platelets: 417 10*3/uL — ABNORMAL HIGH (ref 150–400)
RBC: 3.25 MIL/uL — ABNORMAL LOW (ref 4.22–5.81)
RDW: 14.5 % (ref 11.5–15.5)
WBC: 11.3 10*3/uL — ABNORMAL HIGH (ref 4.0–10.5)
nRBC: 0 % (ref 0.0–0.2)

## 2022-05-08 LAB — BASIC METABOLIC PANEL
Anion gap: 7 (ref 5–15)
BUN: 15 mg/dL (ref 8–23)
CO2: 25 mmol/L (ref 22–32)
Calcium: 9.3 mg/dL (ref 8.9–10.3)
Chloride: 107 mmol/L (ref 98–111)
Creatinine, Ser: 1.04 mg/dL (ref 0.61–1.24)
GFR, Estimated: >60 mL/min (ref >60)
Glucose, Bld: 214 mg/dL — ABNORMAL HIGH (ref 70–99)
Potassium: 3.9 mmol/L (ref 3.5–5.1)
Sodium: 139 mmol/L (ref 135–145)

## 2022-05-08 LAB — MAGNESIUM: Magnesium: 1.9 mg/dL (ref 1.7–2.4)

## 2022-05-08 MED ORDER — SODIUM CHLORIDE 0.9 % IV BOLUS
500.0000 mL | Freq: Once | INTRAVENOUS | Status: AC
  Administered 2022-05-08: 500 mL via INTRAVENOUS

## 2022-05-08 NOTE — ED Notes (Signed)
Discharge paperwork given and verbally understood. 

## 2022-05-08 NOTE — ED Notes (Signed)
No current leg cramps. Usually only been occurring after 1800 for the last few days.

## 2022-05-08 NOTE — ED Provider Notes (Signed)
[facility] EMERGENCY DEPT Provider Note   CSN: [id number] Arrival date & time: 05/08/22  1021     History  Chief complaint: Leg cramps  Arthur Smith is a 71 y.o. male.  HPI   Patient has a history of hyperlipidemia, reflux, bronchitis, diabetes, coronary artery disease, kidney disease.  Patient was recently in the hospital for a spinal fusion earlier this month.  Patient's surgery was complicated with acute kidney injury.  Patient has been taking oxycodone for pain and subsequently developed severe constipation.  Patient's wife who is a nurse was treating him at home with a regimen of laxatives and enemas.  Ultimately he had improvement in that.  However over the last several days the patient has had trouble with leg cramping.  It is not constant but seems to get worse at night.  He has not been sleeping well because of that.  Wife was concerned that his electrolytes could be off because of his change in bowel habits and his decreased intake so she brought him in for evaluation.  Patient's not having abdominal pain.  He denies any swelling in his legs.  Home Medications Prior to Admission medications   Medication Sig Start Date End Date Taking? Authorizing Provider  albuterol (VENTOLIN HFA) 108 (90 Base) MCG/ACT inhaler Inhale 2 puffs into the lungs every 6 (six) hours as needed for wheezing or shortness of breath.    [provider]  Alpha-D-Galactosidase Satira Mccallum) TABS Take 1 tablet by mouth at bedtime.    [provider]  aspirin EC 81 MG tablet Take 1 tablet (81 mg total) by mouth See admin instructions. Patient not taking: Reported on 04/29/2022 11/17/18   Reino Bellis B, NP  atorvastatin (LIPITOR) 80 MG tablet Take 1 tablet (80 mg total) by mouth at bedtime. 02/08/19   Croitoru, Mihai, MD  B Complex Vitamins (VITAMIN B COMPLEX) TABS Take 1 tablet by mouth daily.    [provider]  blood glucose meter kit and supplies Dispense based on  patient and insurance preference. Use up to four times daily as directed. (FOR ICD-10 E10.9, E11.9). 12/03/19   Alma Friendly, MD  cholecalciferol (VITAMIN D3) 25 MCG (1000 UT) tablet Take 1,000 Units by mouth daily.    [provider]  clopidogrel (PLAVIX) 75 MG tablet Take 1 tablet (75 mg total) by mouth daily. Patient not taking: Reported on 04/29/2022 07/09/21   Croitoru, Dani Gobble, MD  Cyanocobalamin 2500 MCG TABS Take 2,500 mcg by mouth daily.    [provider]  ferrous sulfate 325 (65 FE) MG EC tablet Take 325 mg by mouth daily with breakfast.    [provider]  finasteride (PROSCAR) 5 MG tablet Take 5 mg by mouth daily.    [provider]  fluticasone (FLONASE) 50 MCG/ACT nasal spray Place 2 sprays into both nostrils daily as needed for allergies. 05/01/22   Love, Ivan Anchors, PA-C  gabapentin (NEURONTIN) 300 MG capsule Take 1 capsule (300 mg total) by mouth 2 (two) times daily. 05/01/22   Love, Ivan Anchors, PA-C  GARLIC PO Take 1 capsule by mouth daily. Patient not taking: Reported on 04/29/2022    [provider]  imipramine (TOFRANIL-PM) 150 MG capsule TAKE 1 CAPSULE BY MOUTH AT BEDTIME. Patient not taking: Reported on 04/29/2022 07/07/21   Sater, Nanine Means, MD  methocarbamol (ROBAXIN) 500 MG tablet Take 1 tablet (500 mg total) by mouth every 6 (six) hours as needed for muscle spasms. 05/01/22   Reesa Chew  S, PA-C  nitroGLYCERIN (NITROSTAT) 0.4 MG SL tablet Place 1 tablet (0.4 mg total) under the tongue every 5 (five) minutes as needed for chest pain. DON'T take within 24 hrs of Viagra 11/17/18   Cheryln Manly, NP  Omega-3 Fatty Acids (FISH OIL) 1000 MG CAPS Take 1,000 mg by mouth daily. Patient not taking: Reported on 04/29/2022    [provider]  oxyCODONE (OXY IR/ROXICODONE) 5 MG immediate release tablet Take 1 tablet (5 mg total) by mouth every 6 (six) hours as needed for moderate pain ((score 4 to 6)). 05/01/22   Love, Ivan Anchors, PA-C   oxyCODONE (OXYCONTIN) 10 mg 12 hr tablet Take 1 tablet (10 mg total) by mouth every 12 (twelve) hours. 05/01/22   Love, Ivan Anchors, PA-C  pantoprazole (PROTONIX) 40 MG tablet Take 40 mg by mouth daily before breakfast.    [provider]  polyethylene glycol (MIRALAX / GLYCOLAX) 17 g packet Take 17 g by mouth 2 (two) times daily. 05/01/22   Love, Ivan Anchors, PA-C  senna-docusate (SENOKOT-S) 8.6-50 MG tablet Take 2 tablets by mouth at bedtime as needed (constipation).     [provider]  tamsulosin (FLOMAX) 0.4 MG CAPS capsule Take 0.4 mg by mouth every 12 (twelve) hours. 12/05/20   [provider]      Allergies    Eggs or egg-derived products and Lactose intolerance (gi)    Review of Systems   Review of Systems  Physical Exam Updated Vital Signs BP 135/73   Pulse 73   Temp 97.6 F (36.4 C)   Resp 16   Ht 1.753 m ('5\' 9"' )   Wt 81.6 kg   SpO2 99%   BMI 26.58 kg/m  Physical Exam Vitals and nursing note reviewed.  Constitutional:      General: He is not in acute distress.    Appearance: He is well-developed. He is not diaphoretic.  HENT:     Head: Normocephalic and atraumatic.     Right Ear: External ear normal.     Left Ear: External ear normal.  Eyes:     General: No scleral icterus.       Right eye: No discharge.        Left eye: No discharge.     Conjunctiva/sclera: Conjunctivae normal.  Neck:     Trachea: No tracheal deviation.  Cardiovascular:     Rate and Rhythm: Normal rate.  Pulmonary:     Effort: Pulmonary effort is normal. No respiratory distress.     Breath sounds: No stridor.  Abdominal:     General: There is no distension.  Musculoskeletal:        General: No swelling, tenderness or deformity.     Cervical back: Neck supple.     Right lower leg: No edema.     Left lower leg: No edema.     Comments: Normal dorsalis pedis pulses bilaterally  Skin:    General: Skin is warm and dry.     Findings: No rash.  Neurological:     Mental  Status: He is alert.     Cranial Nerves: Cranial nerve deficit: no gross deficits.     ED Results / Procedures / Treatments   Labs (all labs ordered are listed, but only abnormal results are displayed) Labs Reviewed  CBC - Abnormal; Notable for the following components:      Result Value   WBC 11.3 (*)    RBC 3.25 (*)    Hemoglobin 10.2 (*)  HCT 30.9 (*)    Platelets 417 (*)    All other components within normal limits  BASIC METABOLIC PANEL - Abnormal; Notable for the following components:   Glucose, Bld 214 (*)    All other components within normal limits  MAGNESIUM    EKG None  Radiology No results found.  Procedures Procedures    Medications Ordered in ED Medications  sodium chloride 0.9 % bolus 500 mL (500 mLs Intravenous New Bag/Given 05/08/22 1130)    ED Course/ Medical Decision Making/ A&P Clinical Course as of 05/08/22 1238  Fri May 08, 2022  3149 Basic metabolic panel(!) Electrolyte panel unremarkable.  No hyponatremia or hypokalemia [JK]  1219 Magnesium Magnesium level normal at 1.9 [JK]  1219 CBC(!) Blood cell count slightly elevated 11.3.  Hemoglobin stable [JK]    Clinical Course User Index [JK] Dorie Rank, MD                           Medical Decision Making Differential diagnosis includes but not limited to electrolyte abnormalities, dehydration, peripheral vascular disease, venous thrombosis, cellulitis  Problems Addressed: Leg pain, bilateral: acute illness or injury that poses a threat to life or bodily functions  Amount and/or Complexity of Data Reviewed Labs: ordered. Decision-making details documented in ED Course.  Risk Risk Details: His ED work-up is reassuring.  Physical exam demonstrates normal pulses.  No findings to suggest vascular compromise.  He does not have any redness or swelling or tenderness at this time.  I doubt infection or venous thrombosis.  Patient's wife was concerned about the possible electrolyte abnormalities  as he did have large output of stool following the laxative use.  Fortunately no signs of hypokalemia hyponatremia or hypomagnesemia.  Do not think that is the cause of his symptoms.  Is possible this could be a radicular nature associated with his recent surgery although is not having any pain right now.  Wife also did note that he previously had been on much higher dose of Neurontin but with his acute kidney injury associated with the surgery they had decreased his dose significantly.  His renal function is normal now and we will have him try to increase his Neurontin back to his previous dosing.  Recommend close follow-up with his surgeon.   Evaluation and diagnostic testing in the emergency department does not suggest an emergent condition requiring admission or immediate intervention beyond what has been performed at this time.  The patient is safe for discharge and has been instructed to return immediately for worsening symptoms, change in symptoms or any other concerns.       Final Clinical Impression(s) / ED Diagnoses Final diagnoses:  Leg pain, bilateral    Rx / DC Orders ED Discharge Orders     None         Dorie Rank, MD 05/08/22 1239

## 2022-05-08 NOTE — ED Triage Notes (Signed)
Pt states had back surgery Sept. 8, states having low potassium. Was constipated and had several laxatives which emptied him out afterwards had leg cramps.

## 2022-05-08 NOTE — Discharge Instructions (Addendum)
The blood test today in the ED were reassuring.  Try increasing your Neurontin back to your previous dosing as your renal function has returned to normal.  Possible some of this pain is neuropathic in nature and the Neurontin may help.  Follow-up with your neurosurgeon for further evaluation

## 2022-05-11 DIAGNOSIS — M48062 Spinal stenosis, lumbar region with neurogenic claudication: Secondary | ICD-10-CM | POA: Diagnosis not present

## 2022-05-11 DIAGNOSIS — Z6828 Body mass index (BMI) 28.0-28.9, adult: Secondary | ICD-10-CM | POA: Diagnosis not present

## 2022-05-12 ENCOUNTER — Other Ambulatory Visit: Payer: Self-pay | Admitting: Physical Medicine and Rehabilitation

## 2022-05-14 ENCOUNTER — Other Ambulatory Visit: Payer: Self-pay | Admitting: Neurology

## 2022-05-18 ENCOUNTER — Encounter: Payer: Medicare Other | Attending: Physical Medicine & Rehabilitation | Admitting: Physical Medicine & Rehabilitation

## 2022-05-18 DIAGNOSIS — K5900 Constipation, unspecified: Secondary | ICD-10-CM | POA: Insufficient documentation

## 2022-05-18 DIAGNOSIS — G609 Hereditary and idiopathic neuropathy, unspecified: Secondary | ICD-10-CM | POA: Insufficient documentation

## 2022-05-18 DIAGNOSIS — M48061 Spinal stenosis, lumbar region without neurogenic claudication: Secondary | ICD-10-CM | POA: Insufficient documentation

## 2022-05-18 DIAGNOSIS — N179 Acute kidney failure, unspecified: Secondary | ICD-10-CM | POA: Insufficient documentation

## 2022-05-19 DIAGNOSIS — Z4789 Encounter for other orthopedic aftercare: Secondary | ICD-10-CM | POA: Diagnosis not present

## 2022-05-19 DIAGNOSIS — E1142 Type 2 diabetes mellitus with diabetic polyneuropathy: Secondary | ICD-10-CM | POA: Diagnosis not present

## 2022-05-19 DIAGNOSIS — Z981 Arthrodesis status: Secondary | ICD-10-CM | POA: Diagnosis not present

## 2022-05-19 DIAGNOSIS — M4716 Other spondylosis with myelopathy, lumbar region: Secondary | ICD-10-CM | POA: Diagnosis not present

## 2022-05-19 DIAGNOSIS — M48061 Spinal stenosis, lumbar region without neurogenic claudication: Secondary | ICD-10-CM | POA: Diagnosis not present

## 2022-05-19 DIAGNOSIS — I2510 Atherosclerotic heart disease of native coronary artery without angina pectoris: Secondary | ICD-10-CM | POA: Diagnosis not present

## 2022-05-24 ENCOUNTER — Other Ambulatory Visit: Payer: Self-pay | Admitting: Physical Medicine and Rehabilitation

## 2022-05-26 ENCOUNTER — Other Ambulatory Visit (HOSPITAL_COMMUNITY): Payer: Self-pay | Admitting: Physical Medicine & Rehabilitation

## 2022-05-26 NOTE — Telephone Encounter (Signed)
refilled 

## 2022-05-27 ENCOUNTER — Other Ambulatory Visit: Payer: Self-pay | Admitting: Neurology

## 2022-05-27 ENCOUNTER — Other Ambulatory Visit: Payer: Self-pay | Admitting: Physical Medicine & Rehabilitation

## 2022-05-27 ENCOUNTER — Telehealth: Payer: Self-pay | Admitting: Neurology

## 2022-05-27 DIAGNOSIS — M4716 Other spondylosis with myelopathy, lumbar region: Secondary | ICD-10-CM | POA: Diagnosis not present

## 2022-05-27 DIAGNOSIS — I2510 Atherosclerotic heart disease of native coronary artery without angina pectoris: Secondary | ICD-10-CM | POA: Diagnosis not present

## 2022-05-27 DIAGNOSIS — Z4789 Encounter for other orthopedic aftercare: Secondary | ICD-10-CM | POA: Diagnosis not present

## 2022-05-27 DIAGNOSIS — E1142 Type 2 diabetes mellitus with diabetic polyneuropathy: Secondary | ICD-10-CM | POA: Diagnosis not present

## 2022-05-27 DIAGNOSIS — Z981 Arthrodesis status: Secondary | ICD-10-CM | POA: Diagnosis not present

## 2022-05-27 DIAGNOSIS — M48061 Spinal stenosis, lumbar region without neurogenic claudication: Secondary | ICD-10-CM | POA: Diagnosis not present

## 2022-05-27 NOTE — Telephone Encounter (Signed)
I called pt's wife back. She is asking that we resume 800 mg every 6 hours for the pt. Pt was decreased down from this dosage to 300 mg tid and is having severe neuropathy pain.  Last visit with our clinic was in Feb 2023. Will discuss with NP, have tentatively scheduled for 06/01/2022 for f/u appt.

## 2022-05-27 NOTE — Telephone Encounter (Signed)
Pt wife is calling. States Pt needs gabapentin (NEURONTIN) 800 MG capsule not gabapentin (NEURONTIN) 300 MG capsule. States Pt is having so issues and Gabapentin needs to be increase. She is requesting a nurse give her a call.

## 2022-05-27 NOTE — Telephone Encounter (Signed)
Spoke with Judson Roch verbally on this. She recommends pt f/u with Pain Clinic or PCP since we have transitioned him out of the clinic.   I have canceled his appt and will update his wife.

## 2022-05-28 NOTE — Telephone Encounter (Signed)
I called pt's wife and updated on NP's message. She verbalized understanding and appreciation for the call. She will call Dr. Buelah Manis office and discuss further.

## 2022-06-01 ENCOUNTER — Encounter: Payer: Self-pay | Admitting: Physical Medicine & Rehabilitation

## 2022-06-01 ENCOUNTER — Encounter (HOSPITAL_BASED_OUTPATIENT_CLINIC_OR_DEPARTMENT_OTHER): Payer: Medicare Other | Admitting: Physical Medicine & Rehabilitation

## 2022-06-01 ENCOUNTER — Ambulatory Visit: Payer: Medicare Other | Admitting: Neurology

## 2022-06-01 VITALS — BP 130/84 | HR 62 | Ht 69.0 in | Wt 193.0 lb

## 2022-06-01 DIAGNOSIS — K5900 Constipation, unspecified: Secondary | ICD-10-CM

## 2022-06-01 DIAGNOSIS — M48061 Spinal stenosis, lumbar region without neurogenic claudication: Secondary | ICD-10-CM

## 2022-06-01 DIAGNOSIS — N179 Acute kidney failure, unspecified: Secondary | ICD-10-CM

## 2022-06-01 DIAGNOSIS — G609 Hereditary and idiopathic neuropathy, unspecified: Secondary | ICD-10-CM | POA: Diagnosis not present

## 2022-06-01 MED ORDER — GABAPENTIN 400 MG PO CAPS: 800.0000 mg | ORAL_CAPSULE | Freq: Four times a day (QID) | ORAL | 4 refills | Status: DC

## 2022-06-01 MED ORDER — ZONISAMIDE 100 MG PO CAPS: 300.0000 mg | ORAL_CAPSULE | Freq: Every day | ORAL | 3 refills | Status: DC

## 2022-06-01 NOTE — Progress Notes (Signed)
Subjective:    Patient ID: Arthur Smith, male    DOB: June 21, 1951, 71 y.o.   MRN: [id number]  HPI Brief HPI:   Arthur Smith is a 71 y.o. male who who underwent redo laminectomy, decompression and multilevel arthritic thesis of L3-S1 by Dr. Christella Noa on 04/24/2022.  He had significant acute blood loss hypotension.  He was transfused 2 units of packed cells.  He required management in the ICU with vasopressors.  He suffered acute kidney injury secondary to ATN.  Nephrology was consulted and suspected ATN as causes acute renal failure with slowly resolving.  Therapy was working with patient and CIR was recommended due to functional decline.     Hospital Course: Arthur Smith was admitted to rehab 04/29/2022 for inpatient therapies to consist of PT and OT at least three hours five days a week. Past admission physiatrist, therapy team and rehab RN have worked together to provide customized collaborative inpatient rehab.  Therapy evaluations at admission revealed patient required standby to mod assist for basic ADL task and min assist for mobility.  His back incision was each healing without signs or symptoms of infection.  Check of labs at admission showed H&H to be stable and renal status to be slowly improving.  He was noted to have severe constipation and bowel program was being augmented.  On a.m. on 09/15 patient refused to stay in the hospital any longer and insisted on being discharged to home.  He refused to listen to wife or medical staff and was discharged [place].     Interval History Arthur Smith is here for follow-up after having a redo laminectomy by Dr. Christella Noa on 04/24/2022 and having a brief stay at CIR.  He is accompanied by his wife today.  He reports he has been doing well since he was discharged home.  He did restart his gabapentin at 800 mg 4 times daily because he started having worsening pain in his legs and thighs.  After restarting gabapentin his pain has been well  controlled.  He did have a visit to the ER on 05/08/2022 where he had repeat labs completed and creatinine was back down to 1.04 with BUN of 25.  He continues to work with PT at home and is continuing to progress in his function.  He also asked for refill of his Zonegran.  She reports that his constipation is resolved.  Patient says that his peripheral neuropathy pain in his feet has been better since he had Qutenza completed.   Pain Inventory Average Pain 3 Pain Right Now 1 My pain is intermittent, constant, and dull  In the last 24 hours, has pain interfered with the following? General activity 1 Relation with others 1 Enjoyment of life 3 What TIME of day is your pain at its worst? evening Sleep (in general) Fair  Pain is worse with: some activites Pain improves with: rest and medication Relief from Meds: 10  Family History  Problem Relation Age of Onset   Cancer Mother    Stroke Father    Neuropathy Brother    Social History   Socioeconomic History   Marital status: Married    Spouse name: [name]   Number of children: 3   Years of education: 12   Highest education level: Not on file  Occupational History   Occupation: Retired  Tobacco Use   Smoking status: Former    Packs/day: 2.00    Years: 15.00    Total pack years:  30.00    Types: Cigarettes    Quit date: 04/24/1984    Years since quitting: 38.1   Smokeless tobacco: Never  Vaping Use   Vaping Use: Never used  Substance and Sexual Activity   Alcohol use: Yes    Comment: occ   Drug use: No   Sexual activity: Not on file  Other Topics Concern   Not on file  Social History Narrative   Patient lives at home [name] his wife.    Patient has 3 adult children and 1 step son.    Patient has a high school education.    Patient is retired.    [employer]    Social Determinants of Health   Financial Resource Strain: Not on file  Food Insecurity: Not on file  Transportation Needs: Not on file  Physical Activity: Not on  file  Stress: Not on file  Social Connections: Not on file   Past Surgical History:  Procedure Laterality Date   BACK SURGERY     CARDIAC CATHETERIZATION     CORONARY STENT INTERVENTION N/A 11/15/2018   Procedure: CORONARY STENT INTERVENTION;  Surgeon: Martinique, Peter M, MD;  Location: [facility];;  Ost-pCx 95% (DES PCI  STENT SYNERGY DES 3.5X28) &p-mCx 100% (DES PCI STENT SYNERGY DES 2.25X32)   CORONARY STENT INTERVENTION N/A 11/16/2018   Procedure: CORONARY STENT INTERVENTION;  Surgeon: Belva Crome, MD;  Location: [place];  Service: Cardiovascular;  Laterality: N/A;   FINGER SURGERY     RIGHT MIDDLE FINGER   HERNIA REPAIR     UMBILICAL HERNIA [id number]   LEFT HEART CATH AND CORONARY ANGIOGRAPHY N/A 11/15/2018   Procedure: LEFT HEART CATH AND CORONARY ANGIOGRAPHY;  Surgeon: Martinique, Peter M, MD;  Location: [facility];; Severe 2 vessel obstructive CAD:. Ost-pCx 95% (DES PCI) &p-mCx 100% (DES PCI). pRCA 90%, mRCA 85% & rPDA 90%.dLAD ~40%.  EF 55 to 60%.  Moderately elevated LVEDP.  No R WMA.  -- Plan staged PCI to 3 RCA-PDA lesions 4/1   LUMBAR LAMINECTOMY/DECOMPRESSION MICRODISCECTOMY  12/22/2011   Procedure: LUMBAR LAMINECTOMY/DECOMPRESSION MICRODISCECTOMY 1 LEVEL;  Surgeon: Erline Levine, MD;  Location: [place];  Service: Neurosurgery;  Laterality: N/A;  Thoracic ten-eleven laminectomy   LUMBAR LAMINECTOMY/DECOMPRESSION MICRODISCECTOMY Right 05/01/2014   Procedure: Right Lumbar three-four Microdiskectomy;  Surgeon: Erline Levine, MD;  Location: [place] [facility];  Service: Neurosurgery;  Laterality: Right;   LUMBAR LAMINECTOMY/DECOMPRESSION MICRODISCECTOMY Right 11/08/2014   Procedure: Right L4-5 L5-S1 Laminectomy;  Surgeon: Erline Levine, MD;  Location: [place] [facility];  Service: Neurosurgery;  Laterality: Right;  Right L4-5 L5-S1 Laminectomy   MULTIPLE TOOTH EXTRACTIONS     Past Surgical History:  Procedure Laterality Date   BACK SURGERY     CARDIAC CATHETERIZATION      CORONARY STENT INTERVENTION N/A 11/15/2018   Procedure: CORONARY STENT INTERVENTION;  Surgeon: Martinique, Peter M, MD;  Location: [facility];;  Ost-pCx 95% (DES PCI  STENT SYNERGY DES 3.5X28) &p-mCx 100% (DES PCI STENT SYNERGY DES 2.25X32)   CORONARY STENT INTERVENTION N/A 11/16/2018   Procedure: CORONARY STENT INTERVENTION;  Surgeon: Belva Crome, MD;  Location: [place] [facility];  Service: Cardiovascular;  Laterality: N/A;   FINGER SURGERY     RIGHT MIDDLE FINGER   HERNIA REPAIR     UMBILICAL HERNIA [id number]   LEFT HEART CATH AND CORONARY ANGIOGRAPHY N/A 11/15/2018   Procedure: LEFT HEART CATH AND CORONARY ANGIOGRAPHY;  Surgeon: Martinique, Peter M,  MD;  Location: [facility];; Severe 2 vessel obstructive CAD:. Ost-pCx 95% (DES PCI) &p-mCx 100% (DES PCI). pRCA 90%, mRCA 85% & rPDA 90%.dLAD ~40%.  EF 55 to 60%.  Moderately elevated LVEDP.  No R WMA.  -- Plan staged PCI to 3 RCA-PDA lesions 4/1   LUMBAR LAMINECTOMY/DECOMPRESSION MICRODISCECTOMY  12/22/2011   Procedure: LUMBAR LAMINECTOMY/DECOMPRESSION MICRODISCECTOMY 1 LEVEL;  Surgeon: Maeola Harman, MD;  Location: [facility];  Service: Neurosurgery;  Laterality: N/A;  Thoracic ten-eleven laminectomy   LUMBAR LAMINECTOMY/DECOMPRESSION MICRODISCECTOMY Right 05/01/2014   Procedure: Right Lumbar three-four Microdiskectomy;  Surgeon: Maeola Harman, MD;  Location: [facility];  Service: Neurosurgery;  Laterality: Right;   LUMBAR LAMINECTOMY/DECOMPRESSION MICRODISCECTOMY Right 11/08/2014   Procedure: Right L4-5 L5-S1 Laminectomy;  Surgeon: Maeola Harman, MD;  Location: [facility];  Service: Neurosurgery;  Laterality: Right;  Right L4-5 L5-S1 Laminectomy   MULTIPLE TOOTH EXTRACTIONS     Past Medical History:  Diagnosis Date   Arthritis    BPH without obstruction/lower urinary tract symptoms    Bronchitis with asthma, acute 11/2011   CAD S/P percutaneous coronary angioplasty 11/15/2018   Severe 2 vessel obstructive CAD:. Ost-pCx 95% (DES  PCI  STENT SYNERGY DES 3.5X28) &p-mCx 100% (DES PCI STENT SYNERGY DES 2.25X32). pRCA 90%, mRCA 85% & rPDA 90%.dLAD ~40%.  EF 55 to 60%.  Moderately elevated LVEDP.  No R WMA.  Plan staged PCI to 3 RCA-PDA lesions 4/1   Depression    denies   Diabetes mellitus without complication (HCC)    Type II- Diet controlled   Frequency of urination    URINATION AT NIGHT, PAINFUL SLOW STREAM    GERD (gastroesophageal reflux disease)    occ   Hyperlipidemia    Neuromuscular disorder (HCC)    DIZZINESS   PERIPHERAL NEUROPATHY    Neuropathy    No pertinent past medical history    RINGING RIGHT EAR   NSTEMI (non-ST elevated myocardial infarction) (HCC) 11/13/2018   Recurrent upper respiratory infection (URI)    SOB CURRENT COLD   Shortness of breath    d/t "cold" 12/02/11   There were no vitals taken for this visit.  Opioid Risk Score:   Fall Risk Score:  `1  Depression screen PHQ 2/9     03/30/2022    2:12 PM 02/09/2022    2:12 PM 12/22/2021    2:12 PM  Depression screen PHQ 2/9  Decreased Interest 0 0 0  Down, Depressed, Hopeless 0 0 0  PHQ - 2 Score 0 0 0  Altered sleeping   1  Tired, decreased energy   0  Change in appetite   0  Feeling bad or failure about yourself    0  Trouble concentrating   0  Moving slowly or fidgety/restless   0  Suicidal thoughts   0  PHQ-9 Score   1    Review of Systems  Musculoskeletal:        Pain from both upper legs down to both calves  Psychiatric/Behavioral:  Positive for suicidal ideas.   All other systems reviewed and are negative.      Objective:   Physical Exam  Gen: no distress, normal appearing HEENT: oral mucosa pink and moist, NCAT, conjugate gaze Cardio: Reg rate Chest: normal effort, normal rate of breathing Abd: soft, non-distended Ext: no edema Psych: pleasant, normal affect Skin: intact Neuro: Alert and oriented x3, follows commands, CN 2-12 grossly intact Strength 5/5 in b/l UE and LE Musculoskeletal:  Slump test neg  bilaterally Sensation intact to LT to all 4 extremities, Altered sensation to LT in distal b/l LE No significant lumbar paraspinal tenderness, his surgical incision is well-healed chronic R distal 3rd digit amputation      Assessment & Plan:  Lumbar spinal stenosis s/p interbody fusion 9/8 Dr. Christella Noa -Patient appears to be recovering well, reports his pain is overall improved since his surgery -Continue physical therapy -Continue current regimen of 800 mg gabapentin 4 times daily,Tofranil 150 mg at bedtime and Zonegran 300 mg at bedtime. -Gabapentin and Zonegran ordered today  Idiopathic primarily sensory peripheral neuropathy -Continue medications as above -Discussed Qutenza with patient, patient would like to wait and see if his neuropathy pain returns.  If his pain returns he will give [place] a call to schedule repeat Qutenza treatments  AKI -Resolved, he had a repeat BMP 05/08/2022 that showed creatinine of 1.04 with BUN of 15  Constipation -Reports this has resolved and he is having regular bowel movements

## 2022-06-03 DIAGNOSIS — J45909 Unspecified asthma, uncomplicated: Secondary | ICD-10-CM | POA: Diagnosis not present

## 2022-06-03 DIAGNOSIS — Z9181 History of falling: Secondary | ICD-10-CM | POA: Diagnosis not present

## 2022-06-03 DIAGNOSIS — I2510 Atherosclerotic heart disease of native coronary artery without angina pectoris: Secondary | ICD-10-CM | POA: Diagnosis not present

## 2022-06-03 DIAGNOSIS — Z7982 Long term (current) use of aspirin: Secondary | ICD-10-CM | POA: Diagnosis not present

## 2022-06-03 DIAGNOSIS — I252 Old myocardial infarction: Secondary | ICD-10-CM | POA: Diagnosis not present

## 2022-06-03 DIAGNOSIS — E1142 Type 2 diabetes mellitus with diabetic polyneuropathy: Secondary | ICD-10-CM | POA: Diagnosis not present

## 2022-06-03 DIAGNOSIS — Z4789 Encounter for other orthopedic aftercare: Secondary | ICD-10-CM | POA: Diagnosis not present

## 2022-06-03 DIAGNOSIS — Z6826 Body mass index (BMI) 26.0-26.9, adult: Secondary | ICD-10-CM | POA: Diagnosis not present

## 2022-06-03 DIAGNOSIS — E669 Obesity, unspecified: Secondary | ICD-10-CM | POA: Diagnosis not present

## 2022-06-03 DIAGNOSIS — F32A Depression, unspecified: Secondary | ICD-10-CM | POA: Diagnosis not present

## 2022-06-03 DIAGNOSIS — D62 Acute posthemorrhagic anemia: Secondary | ICD-10-CM | POA: Diagnosis not present

## 2022-06-03 DIAGNOSIS — Z87891 Personal history of nicotine dependence: Secondary | ICD-10-CM | POA: Diagnosis not present

## 2022-06-03 DIAGNOSIS — M48061 Spinal stenosis, lumbar region without neurogenic claudication: Secondary | ICD-10-CM | POA: Diagnosis not present

## 2022-06-03 DIAGNOSIS — I959 Hypotension, unspecified: Secondary | ICD-10-CM | POA: Diagnosis not present

## 2022-06-03 DIAGNOSIS — Z981 Arthrodesis status: Secondary | ICD-10-CM | POA: Diagnosis not present

## 2022-06-03 DIAGNOSIS — Z7951 Long term (current) use of inhaled steroids: Secondary | ICD-10-CM | POA: Diagnosis not present

## 2022-06-03 DIAGNOSIS — R339 Retention of urine, unspecified: Secondary | ICD-10-CM | POA: Diagnosis not present

## 2022-06-03 DIAGNOSIS — N138 Other obstructive and reflux uropathy: Secondary | ICD-10-CM | POA: Diagnosis not present

## 2022-06-03 DIAGNOSIS — N401 Enlarged prostate with lower urinary tract symptoms: Secondary | ICD-10-CM | POA: Diagnosis not present

## 2022-06-03 DIAGNOSIS — M4716 Other spondylosis with myelopathy, lumbar region: Secondary | ICD-10-CM | POA: Diagnosis not present

## 2022-06-05 DIAGNOSIS — M4716 Other spondylosis with myelopathy, lumbar region: Secondary | ICD-10-CM | POA: Diagnosis not present

## 2022-06-05 DIAGNOSIS — Z981 Arthrodesis status: Secondary | ICD-10-CM | POA: Diagnosis not present

## 2022-06-05 DIAGNOSIS — M48061 Spinal stenosis, lumbar region without neurogenic claudication: Secondary | ICD-10-CM | POA: Diagnosis not present

## 2022-06-05 DIAGNOSIS — Z4789 Encounter for other orthopedic aftercare: Secondary | ICD-10-CM | POA: Diagnosis not present

## 2022-06-05 DIAGNOSIS — E1142 Type 2 diabetes mellitus with diabetic polyneuropathy: Secondary | ICD-10-CM | POA: Diagnosis not present

## 2022-06-05 DIAGNOSIS — I2510 Atherosclerotic heart disease of native coronary artery without angina pectoris: Secondary | ICD-10-CM | POA: Diagnosis not present

## 2022-06-10 ENCOUNTER — Ambulatory Visit: Payer: 59 | Admitting: Cardiovascular Disease

## 2022-06-11 DIAGNOSIS — E1142 Type 2 diabetes mellitus with diabetic polyneuropathy: Secondary | ICD-10-CM | POA: Diagnosis not present

## 2022-06-11 DIAGNOSIS — M48061 Spinal stenosis, lumbar region without neurogenic claudication: Secondary | ICD-10-CM | POA: Diagnosis not present

## 2022-06-11 DIAGNOSIS — Z981 Arthrodesis status: Secondary | ICD-10-CM | POA: Diagnosis not present

## 2022-06-11 DIAGNOSIS — I2510 Atherosclerotic heart disease of native coronary artery without angina pectoris: Secondary | ICD-10-CM | POA: Diagnosis not present

## 2022-06-11 DIAGNOSIS — M4716 Other spondylosis with myelopathy, lumbar region: Secondary | ICD-10-CM | POA: Diagnosis not present

## 2022-06-11 DIAGNOSIS — Z4789 Encounter for other orthopedic aftercare: Secondary | ICD-10-CM | POA: Diagnosis not present

## 2022-06-27 ENCOUNTER — Other Ambulatory Visit: Payer: Self-pay | Admitting: Neurology

## 2022-06-29 ENCOUNTER — Telehealth: Payer: Self-pay | Admitting: Cardiovascular Disease

## 2022-06-29 NOTE — Telephone Encounter (Signed)
 *  STAT* If patient is at the pharmacy, call can be transferred to refill team.   1. Which medications need to be refilled? (please list name of each medication and dose if known) nitroGLYCERIN (NITROSTAT) 0.4 MG SL tablet   2. Which pharmacy/location (including street and city if local pharmacy) is medication to be sent to?  [id number] [place], [place] - [address]    3. Do they need a 30 day or 90 day supply? 1 bottle

## 2022-06-30 MED ORDER — NITROGLYCERIN 0.4 MG SL SUBL: 0.4000 mg | SUBLINGUAL_TABLET | SUBLINGUAL | 6 refills | Status: DC | PRN

## 2022-06-30 NOTE — Telephone Encounter (Signed)
Refill sent to pharmacy.   

## 2022-07-23 DIAGNOSIS — N3941 Urge incontinence: Secondary | ICD-10-CM | POA: Diagnosis not present

## 2022-07-23 DIAGNOSIS — R8279 Other abnormal findings on microbiological examination of urine: Secondary | ICD-10-CM | POA: Diagnosis not present

## 2022-07-23 DIAGNOSIS — R3915 Urgency of urination: Secondary | ICD-10-CM | POA: Diagnosis not present

## 2022-07-23 DIAGNOSIS — R350 Frequency of micturition: Secondary | ICD-10-CM | POA: Diagnosis not present

## 2022-07-23 DIAGNOSIS — N401 Enlarged prostate with lower urinary tract symptoms: Secondary | ICD-10-CM | POA: Diagnosis not present

## 2022-07-29 ENCOUNTER — Other Ambulatory Visit: Payer: Self-pay | Admitting: Cardiovascular Disease

## 2022-08-06 DIAGNOSIS — R350 Frequency of micturition: Secondary | ICD-10-CM | POA: Diagnosis not present

## 2022-08-08 ENCOUNTER — Emergency Department (HOSPITAL_BASED_OUTPATIENT_CLINIC_OR_DEPARTMENT_OTHER)
Admission: EM | Admit: 2022-08-08 | Discharge: 2022-08-08 | Disposition: A | Discharge status: Home / Self Care | Payer: Medicare Other | Attending: Emergency Medicine | Admitting: Emergency Medicine

## 2022-08-08 ENCOUNTER — Encounter (HOSPITAL_BASED_OUTPATIENT_CLINIC_OR_DEPARTMENT_OTHER): Payer: Self-pay | Admitting: Emergency Medicine

## 2022-08-08 ENCOUNTER — Other Ambulatory Visit: Payer: Self-pay

## 2022-08-08 ENCOUNTER — Emergency Department (HOSPITAL_BASED_OUTPATIENT_CLINIC_OR_DEPARTMENT_OTHER): Payer: Medicare Other | Admitting: Radiology

## 2022-08-08 DIAGNOSIS — Z7982 Long term (current) use of aspirin: Secondary | ICD-10-CM | POA: Diagnosis not present

## 2022-08-08 DIAGNOSIS — S80811A Abrasion, right lower leg, initial encounter: Secondary | ICD-10-CM | POA: Diagnosis not present

## 2022-08-08 DIAGNOSIS — S8991XA Unspecified injury of right lower leg, initial encounter: Secondary | ICD-10-CM | POA: Diagnosis present

## 2022-08-08 DIAGNOSIS — W010XXA Fall on same level from slipping, tripping and stumbling without subsequent striking against object, initial encounter: Secondary | ICD-10-CM | POA: Insufficient documentation

## 2022-08-08 DIAGNOSIS — Y9301 Activity, walking, marching and hiking: Secondary | ICD-10-CM | POA: Diagnosis not present

## 2022-08-08 DIAGNOSIS — R000 Tachycardia, unspecified: Secondary | ICD-10-CM | POA: Insufficient documentation

## 2022-08-08 DIAGNOSIS — Y92410 Unspecified street and highway as the place of occurrence of the external cause: Secondary | ICD-10-CM | POA: Insufficient documentation

## 2022-08-08 DIAGNOSIS — Z7902 Long term (current) use of antithrombotics/antiplatelets: Secondary | ICD-10-CM | POA: Insufficient documentation

## 2022-08-08 DIAGNOSIS — Z79899 Other long term (current) drug therapy: Secondary | ICD-10-CM | POA: Insufficient documentation

## 2022-08-08 DIAGNOSIS — Z23 Encounter for immunization: Secondary | ICD-10-CM | POA: Diagnosis not present

## 2022-08-08 DIAGNOSIS — S3992XA Unspecified injury of lower back, initial encounter: Secondary | ICD-10-CM | POA: Insufficient documentation

## 2022-08-08 DIAGNOSIS — W19XXXA Unspecified fall, initial encounter: Secondary | ICD-10-CM

## 2022-08-08 MED ORDER — TETANUS-DIPHTH-ACELL PERTUSSIS 5-2.5-18.5 LF-MCG/0.5 IM SUSY
0.5000 mL | PREFILLED_SYRINGE | Freq: Once | INTRAMUSCULAR | Status: AC
  Administered 2022-08-08: 0.5 mL via INTRAMUSCULAR
  Filled 2022-08-08: qty 0.5

## 2022-08-08 NOTE — ED Provider Notes (Signed)
[place] EMERGENCY DEPT  Provider Note  CSN: [id number] Arrival date & time: 08/08/22 9417  History Chief Complaint  Patient presents with   Arthur Smith is a 71 y.o. male with history of multiple medical problems reports he got into an argument with his wife around midnight and so he left the house walking down the road. At some point during his walking he stumbled and fell, injuring his R leg and his lower back. He recently had lumbar fusion surgery in September. He denies any head injury. He just continued walking until he arrived here several hours later. He states he has otherwise been in his usual state of health.    Home Medications Prior to Admission medications   Medication Sig Start Date End Date Taking? Authorizing Provider  albuterol (VENTOLIN HFA) 108 (90 Base) MCG/ACT inhaler Inhale 2 puffs into the lungs every 6 (six) hours as needed for wheezing or shortness of breath.    [provider]  Alpha-D-Galactosidase Satira Mccallum) TABS Take 1 tablet by mouth at bedtime.    [provider]  aspirin EC 81 MG tablet Take 1 tablet (81 mg total) by mouth See admin instructions. 11/17/18   Cheryln Manly, NP  atorvastatin (LIPITOR) 80 MG tablet Take 1 tablet (80 mg total) by mouth at bedtime. 02/08/19   Croitoru, Mihai, MD  B Complex Vitamins (VITAMIN B COMPLEX) TABS Take 1 tablet by mouth daily.    [provider]  blood glucose meter kit and supplies Dispense based on patient and insurance preference. Use up to four times daily as directed. (FOR ICD-10 E10.9, E11.9). 12/03/19   Alma Friendly, MD  cholecalciferol (VITAMIN D3) 25 MCG (1000 UT) tablet Take 1,000 Units by mouth daily.    [provider]  clopidogrel (PLAVIX) 75 MG tablet TAKE 1 TABLET BY MOUTH EVERY DAY 07/29/22   Croitoru, Mihai, MD  Cyanocobalamin 2500 MCG TABS Take 2,500 mcg by mouth daily.    [provider]  diazepam (VALIUM) 5 MG tablet Take  10 mg by mouth 2 (two) times daily. 05/27/22   [provider]  ferrous sulfate 325 (65 FE) MG EC tablet Take 325 mg by mouth daily with breakfast.    [provider]  finasteride (PROSCAR) 5 MG tablet Take 5 mg by mouth daily.    [provider]  fluticasone (FLONASE) 50 MCG/ACT nasal spray Place 2 sprays into both nostrils daily as needed for allergies. 05/01/22   Love, Ivan Anchors, PA-C  gabapentin (NEURONTIN) 400 MG capsule Take 2 capsules (800 mg total) by mouth 4 (four) times daily. 06/01/22   Jennye Boroughs, MD  GARLIC PO Take 1 capsule by mouth daily. Patient not taking: Reported on 06/01/2022    [provider]  imipramine (TOFRANIL-PM) 150 MG capsule TAKE 1 CAPSULE BY MOUTH EVERYDAY AT BEDTIME 06/29/22   Sater, Nanine Means, MD  methocarbamol (ROBAXIN) 500 MG tablet TAKE 1 TABLET BY MOUTH EVERY 6 HOURS AS NEEDED FOR MUSCLE SPASMS. 05/13/22   Jennye Boroughs, MD  nitroGLYCERIN (NITROSTAT) 0.4 MG SL tablet Place 1 tablet (0.4 mg total) under the tongue every 5 (five) minutes as needed for chest pain. DON'T take within 24 hrs of Viagra 06/30/22   Croitoru, Mihai, MD  Omega-3 Fatty Acids (FISH OIL) 1000 MG CAPS Take 1,000 mg by mouth daily.    [provider]  oxyCODONE (OXY IR/ROXICODONE) 5 MG immediate release tablet Take by mouth. 05/04/22   [provider]  pantoprazole (PROTONIX) 40 MG tablet Take 40 mg by mouth daily before breakfast.    [provider]  polyethylene glycol (MIRALAX / GLYCOLAX) 17 g packet Take 17 g by mouth 2 (two) times daily. 05/01/22   Love, Ivan Anchors, PA-C  senna-docusate (SENOKOT-S) 8.6-50 MG tablet Take 2 tablets by mouth at bedtime as needed (constipation).     [provider]  tamsulosin (FLOMAX) 0.4 MG CAPS capsule Take 0.4 mg by mouth every 12 (twelve) hours. 12/05/20   [provider]  zonisamide (ZONEGRAN) 100 MG capsule Take 3 capsules (300 mg total) by mouth daily. 06/01/22    Jennye Boroughs, MD     Allergies    Eggs or egg-derived products and Lactose intolerance (gi)   Review of Systems   Review of Systems Please see HPI for pertinent positives and negatives  Physical Exam BP 131/81   Pulse 100   Temp 98.3 F (36.8 C)   Resp 16   Ht 5' 9" (1.753 m)   Wt 87.5 kg   SpO2 93%   BMI 28.49 kg/m   Physical Exam Vitals and nursing note reviewed.  Constitutional:      Appearance: Normal appearance.  HENT:     Head: Normocephalic and atraumatic.     Nose: Nose normal.     Mouth/Throat:     Mouth: Mucous membranes are moist.  Eyes:     Extraocular Movements: Extraocular movements intact.     Conjunctiva/sclera: Conjunctivae normal.  Cardiovascular:     Rate and Rhythm: Tachycardia present.  Pulmonary:     Effort: Pulmonary effort is normal.     Breath sounds: Normal breath sounds.  Abdominal:     General: Abdomen is flat.     Palpations: Abdomen is soft.     Tenderness: There is no abdominal tenderness.  Musculoskeletal:        General: Tenderness (midline and R lateral lower back; mild tenderness to R knee without swelling) present. No swelling. Normal range of motion.     Cervical back: Neck supple.  Skin:    General: Skin is warm and dry.     Comments: Abrasion to R anterior leg  Neurological:     General: No focal deficit present.     Mental Status: He is alert.  Psychiatric:        Mood and Affect: Mood normal.     ED Results / Procedures / Treatments   EKG None  Procedures Procedures  Medications Ordered in the ED Medications  Tdap (BOOSTRIX) injection 0.5 mL (0.5 mLs Intramuscular Given 08/08/22 0539)    Initial Impression and Plan  Patient here with a mechanical fall while walking down the road in the middle of the night some time ago. He was noted to be tachycardic on arrival but is asymptomatic. Will check xrays, allow him to rest in the ED and reassess for other acute medical concerns. TDAP updated  ED Course    Clinical Course as of 08/08/22 7290  Sat Aug 08, 2022  0650 I personally viewed the images from radiology studies and agree with radiologist interpretation: Xrays are neg. Patient is feeling better. HR is improved. He denies any new acute symptoms. States he is sleepy and tired from walking all night. He has contacted his wife who has told him she will come and get him.  [CS]    Clinical Course User Index [CS] Truddie Hidden, MD     MDM Rules/Calculators/A&P Medical Decision Making  Problems Addressed: Abrasion of right lower extremity, initial encounter: acute illness or injury Fall, initial encounter: acute illness or injury  Amount and/or Complexity of Data Reviewed Radiology: ordered and independent interpretation performed. Decision-making details documented in ED Course.  Risk Prescription drug management.    Final Clinical Impression(s) / ED Diagnoses Final diagnoses:  Fall, initial encounter  Abrasion of right lower extremity, initial encounter    Rx / DC Orders ED Discharge Orders     None        Truddie Hidden, MD 08/08/22 [phone]

## 2022-08-08 NOTE — ED Triage Notes (Addendum)
Pt c/o lower back pain and right knee pain after falling tonight. Pt has a scrap to right lower leg. Pt had recent lower back surgery. Pt states that he has been walking since 0000. Pt's HR 120s in triage

## 2022-08-20 DIAGNOSIS — R972 Elevated prostate specific antigen [PSA]: Secondary | ICD-10-CM | POA: Diagnosis not present

## 2022-08-20 DIAGNOSIS — N401 Enlarged prostate with lower urinary tract symptoms: Secondary | ICD-10-CM | POA: Diagnosis not present

## 2022-08-20 DIAGNOSIS — R350 Frequency of micturition: Secondary | ICD-10-CM | POA: Diagnosis not present

## 2022-08-24 ENCOUNTER — Encounter: Payer: Medicare Other | Attending: Physical Medicine & Rehabilitation | Admitting: Physical Medicine & Rehabilitation

## 2022-08-24 ENCOUNTER — Encounter: Payer: Self-pay | Admitting: Physical Medicine & Rehabilitation

## 2022-08-24 VITALS — BP 117/77 | HR 67 | Ht 69.0 in | Wt 195.0 lb

## 2022-08-24 DIAGNOSIS — M48061 Spinal stenosis, lumbar region without neurogenic claudication: Secondary | ICD-10-CM | POA: Diagnosis not present

## 2022-08-24 DIAGNOSIS — G609 Hereditary and idiopathic neuropathy, unspecified: Secondary | ICD-10-CM | POA: Insufficient documentation

## 2022-08-24 DIAGNOSIS — G4700 Insomnia, unspecified: Secondary | ICD-10-CM | POA: Insufficient documentation

## 2022-08-24 NOTE — Progress Notes (Addendum)
 [place]  Subjective:    Patient ID: Arthur Smith, male    DOB: 1950/10/23, 72 y.o.   MRN: [id number]  HPI Brief HPI:   Arthur Smith is a 72 y.o. male who who underwent redo laminectomy, decompression and multilevel arthritic thesis of L3-S1 by Dr. Gillie on 04/24/2022.  He had significant acute blood loss hypotension.  He was transfused 2 units of packed cells.  He required management in the ICU with vasopressors.  He suffered acute kidney injury secondary to ATN.  Nephrology was consulted and suspected ATN as causes acute renal failure with slowly resolving.  Therapy was working with patient and CIR was recommended due to functional decline.     Hospital Course: Arthur Smith was admitted to rehab 04/29/2022 for inpatient therapies to consist of PT and OT at least three hours five days a week. Past admission physiatrist, therapy team and rehab RN have worked together to provide customized collaborative inpatient rehab.  Therapy evaluations at admission revealed patient required standby to mod assist for basic ADL task and min assist for mobility.  His back incision was each healing without signs or symptoms of infection.  Check of labs at admission showed H&H to be stable and renal status to be slowly improving.  He was noted to have severe constipation and bowel program was being augmented.  On a.m. on 09/15 patient refused to stay in the hospital any longer and insisted on being discharged to home.  He refused to listen to wife or medical staff and was discharged AGAINST MEDICAL ADVICE.      Visit 06/01/22 Arthur Smith is here for follow-up after having a redo laminectomy by Dr. Gillie on 04/24/2022 and having a brief stay at CIR.  He is accompanied by his wife today.  He reports he has been doing well since he was discharged home.  He did restart his gabapentin  at 800 mg 4 times daily because he started having worsening pain in his legs and thighs.  After restarting gabapentin  his pain has been  well controlled.  He did have a visit to the ER on 05/08/2022 where he had repeat labs completed and creatinine was back down to 1.04 with BUN of 25.  He continues to work with PT at home and is continuing to progress in his function.  He also asked for refill of his Zonegran .  She reports that his constipation is resolved.  Patient says that his peripheral neuropathy pain in his feet has been better since he had Qutenza  completed.  Interval History 08/25/2022 History also history of diabetes chronic pain.  He reports his back is doing well after his redo laminectomy.  Pain is overall controlled.  He continues to have burning pain in his legs and feet.  Gabapentin  800 mg 4 times daily, Zonegran  300 mg and Tofranil  150 mg are helping to keep his pain manageable.  We discussed prior treatment with Qutenza .  Patient reports this was beneficial to his neuropathic pain in the past.  Patient reports he has had some difficulty falling asleep.  He would like to not use any strong medications for this.   Pain Inventory Average Pain 8 Pain Right Now 3 My pain is intermittent, burning, tingling, and aching  In the last 24 hours, has pain interfered with the following? General activity 5 Relation with others 6 Enjoyment of life 7 What TIME of day is your pain at its worst? night Sleep (in general) Poor  Pain is worse with:  walking and standing Pain improves with: medication Relief from Meds: 9  Family History  Problem Relation Age of Onset   Cancer Mother    Stroke Father    Neuropathy Brother    Social History   Socioeconomic History   Marital status: Married    Spouse name: [name]   Number of children: 3   Years of education: 12   Highest education level: Not on file  Occupational History   Occupation: Retired  Tobacco Use   Smoking status: Former    Packs/day: 2.00    Years: 15.00    Total pack years: 30.00    Types: Cigarettes    Quit date: 04/24/1984    Years since quitting: 38.3    Smokeless tobacco: Never  Vaping Use   Vaping Use: Never used  Substance and Sexual Activity   Alcohol use: Yes    Comment: occ   Drug use: No   Sexual activity: Not on file  Other Topics Concern   Not on file  Social History Narrative   Patient lives at home [name] his wife.    Patient has 3 adult children and 1 step son.    Patient has a high school education.    Patient is retired.    [place]  [employer]    Social Determinants of Health   Financial Resource Strain: Not on file  Food Insecurity: Not on file  Transportation Needs: Not on file  Physical Activity: Not on file  Stress: Not on file  Social Connections: Not on file   Past Surgical History:  Procedure Laterality Date   BACK SURGERY     BACK SURGERY  04/24/2022   Dr. Gillie   CARDIAC CATHETERIZATION     CORONARY STENT INTERVENTION N/A 11/15/2018   Procedure: CORONARY STENT INTERVENTION;  Surgeon: Swaziland, Peter M, MD;  Location: [facility] INVASIVE CV LAB;;  Ost-pCx 95% (DES PCI  STENT SYNERGY DES 3.5X28) &p-mCx 100% (DES PCI STENT SYNERGY DES 2.25X32)   CORONARY STENT INTERVENTION N/A 11/16/2018   Procedure: CORONARY STENT INTERVENTION;  Surgeon: Claudene Victory ORN, MD;  Location: [facility] INVASIVE CV LAB;  Service: Cardiovascular;  Laterality: N/A;   FINGER SURGERY     RIGHT MIDDLE FINGER   HERNIA REPAIR     UMBILICAL HERNIA 2011   LEFT HEART CATH AND CORONARY ANGIOGRAPHY N/A 11/15/2018   Procedure: LEFT HEART CATH AND CORONARY ANGIOGRAPHY;  Surgeon: Swaziland, Peter M, MD;  Location: [facility] INVASIVE CV LAB;; Severe 2 vessel obstructive CAD:. Ost-pCx 95% (DES PCI) &p-mCx 100% (DES PCI). pRCA 90%, mRCA 85% & rPDA 90%.dLAD ~40%.  EF 55 to 60%.  Moderately elevated LVEDP.  No R WMA.  -- Plan staged PCI to 3 RCA-PDA lesions 4/1   LUMBAR LAMINECTOMY/DECOMPRESSION MICRODISCECTOMY  12/22/2011   Procedure: LUMBAR LAMINECTOMY/DECOMPRESSION MICRODISCECTOMY 1 LEVEL;  Surgeon: Fairy Levels, MD;  Location: [facility] NEURO ORS;  Service: Neurosurgery;  Laterality: N/A;   Thoracic ten-eleven laminectomy   LUMBAR LAMINECTOMY/DECOMPRESSION MICRODISCECTOMY Right 05/01/2014   Procedure: Right Lumbar three-four Microdiskectomy;  Surgeon: Fairy Levels, MD;  Location: [facility] NEURO ORS;  Service: Neurosurgery;  Laterality: Right;   LUMBAR LAMINECTOMY/DECOMPRESSION MICRODISCECTOMY Right 11/08/2014   Procedure: Right L4-5 L5-S1 Laminectomy;  Surgeon: Fairy Levels, MD;  Location: [facility] NEURO ORS;  Service: Neurosurgery;  Laterality: Right;  Right L4-5 L5-S1 Laminectomy   MULTIPLE TOOTH EXTRACTIONS     Past Surgical History:  Procedure Laterality Date   BACK SURGERY     BACK SURGERY  04/24/2022   Dr. Cabbell  CARDIAC CATHETERIZATION     CORONARY STENT INTERVENTION N/A 11/15/2018   Procedure: CORONARY STENT INTERVENTION;  Surgeon: Swaziland, Peter M, MD;  Location: [facility] INVASIVE CV LAB;;  Ost-pCx 95% (DES PCI  STENT SYNERGY DES 3.5X28) &p-mCx 100% (DES PCI STENT SYNERGY DES 2.25X32)   CORONARY STENT INTERVENTION N/A 11/16/2018   Procedure: CORONARY STENT INTERVENTION;  Surgeon: Claudene Victory ORN, MD;  Location: [facility] INVASIVE CV LAB;  Service: Cardiovascular;  Laterality: N/A;   FINGER SURGERY     RIGHT MIDDLE FINGER   HERNIA REPAIR     UMBILICAL HERNIA 2011   LEFT HEART CATH AND CORONARY ANGIOGRAPHY N/A 11/15/2018   Procedure: LEFT HEART CATH AND CORONARY ANGIOGRAPHY;  Surgeon: Swaziland, Peter M, MD;  Location: [facility] INVASIVE CV LAB;; Severe 2 vessel obstructive CAD:. Ost-pCx 95% (DES PCI) &p-mCx 100% (DES PCI). pRCA 90%, mRCA 85% & rPDA 90%.dLAD ~40%.  EF 55 to 60%.  Moderately elevated LVEDP.  No R WMA.  -- Plan staged PCI to 3 RCA-PDA lesions 4/1   LUMBAR LAMINECTOMY/DECOMPRESSION MICRODISCECTOMY  12/22/2011   Procedure: LUMBAR LAMINECTOMY/DECOMPRESSION MICRODISCECTOMY 1 LEVEL;  Surgeon: Fairy Levels, MD;  Location: [facility] NEURO ORS;  Service: Neurosurgery;  Laterality: N/A;  Thoracic ten-eleven laminectomy   LUMBAR LAMINECTOMY/DECOMPRESSION MICRODISCECTOMY Right 05/01/2014   Procedure:  Right Lumbar three-four Microdiskectomy;  Surgeon: Fairy Levels, MD;  Location: [facility] NEURO ORS;  Service: Neurosurgery;  Laterality: Right;   LUMBAR LAMINECTOMY/DECOMPRESSION MICRODISCECTOMY Right 11/08/2014   Procedure: Right L4-5 L5-S1 Laminectomy;  Surgeon: Fairy Levels, MD;  Location: [facility] NEURO ORS;  Service: Neurosurgery;  Laterality: Right;  Right L4-5 L5-S1 Laminectomy   MULTIPLE TOOTH EXTRACTIONS     Past Medical History:  Diagnosis Date   Arthritis    BPH without obstruction/lower urinary tract symptoms    Bronchitis with asthma, acute 11/2011   CAD S/P percutaneous coronary angioplasty 11/15/2018   Severe 2 vessel obstructive CAD:. Ost-pCx 95% (DES PCI  STENT SYNERGY DES 3.5X28) &p-mCx 100% (DES PCI STENT SYNERGY DES 2.25X32). pRCA 90%, mRCA 85% & rPDA 90%.dLAD ~40%.  EF 55 to 60%.  Moderately elevated LVEDP.  No R WMA.  Plan staged PCI to 3 RCA-PDA lesions 4/1   Depression    denies   Diabetes mellitus without complication (HCC)    Type II- Diet controlled   Frequency of urination    URINATION AT NIGHT, PAINFUL SLOW STREAM    GERD (gastroesophageal reflux disease)    occ   Hyperlipidemia    Neuromuscular disorder (HCC)    DIZZINESS   PERIPHERAL NEUROPATHY    Neuropathy    No pertinent past medical history    RINGING RIGHT EAR   NSTEMI (non-ST elevated myocardial infarction) (HCC) 11/13/2018   Recurrent upper respiratory infection (URI)    SOB CURRENT COLD   Shortness of breath    d/t cold 12/02/11   BP 117/77   Pulse 67   Ht 5' 9 (1.753 m)   Wt 195 lb (88.5 kg)   SpO2 98%   BMI 28.80 kg/m   Opioid Risk Score:   Fall Risk Score:  `1  Depression screen PHQ 2/9     06/01/2022    2:38 PM 03/30/2022    2:12 PM 02/09/2022    2:12 PM 12/22/2021    2:12 PM  Depression screen PHQ 2/9  Decreased Interest 0 0 0 0  Down, Depressed, Hopeless 0 0 0 0  PHQ - 2 Score 0 0 0 0  Altered sleeping    1  Tired, decreased energy    0  Change in appetite    0  Feeling bad or  failure about yourself     0  Trouble concentrating    0  Moving slowly or fidgety/restless    0  Suicidal thoughts    0  PHQ-9 Score    1    Review of Systems  Musculoskeletal:        Pain in both feet  All other systems reviewed and are negative.      Objective:   Physical Exam  Gen: no distress, normal appearing HEENT: oral mucosa pink and moist, NCAT, conjugate gaze Chest: normal effort, normal rate of breathing Abd: soft, non-distended Ext: no edema Psych: pleasant, normal affect Skin: intact Neuro: Alert and awake, follows commands, CN 2-12 grossly intact,FTN intact b/l Strength 5/5 in b/l UE and LE Sensation intact to LT to all 4 extremities, decreased in b/l LE in stocking glove distribution  Musculoskeletal:  Slump test neg bilaterally Minimal lumbar paraspinal tenderness chronic R distal 3rd digit amputation      Assessment & Plan:   Lumbar spinal stenosis s/p interbody fusion 9/8 Dr. Gillie -Continue current regimen of 800 mg gabapentin  4 times daily,Tofranil  150 mg at bedtime and Zonegran  300 mg at bedtime. -Gabapentin  and Zonegran  ordered today   Idiopathic primarily sensory peripheral neuropathy -Continue medications as above -Pt reports benefit with qutenza  treatment in the past. Will repeat treatment, will attempt to decrease oral medications in pain improves    [place] -Discussed trying melatonin, pt says he will buy OTC   04/02/23 Called patient back, he reports he was at ER yesterday for tremors and mild confusion.  He says he was recommended to decrease his gabapentin  to 400 mg 3 times daily.  He reports he tried this and is doing okay on this dose.  Will continue lower dose.  He is feeling better today.  Also advised to be careful maintaining good hydration.  Patient or so reports he is working with a different group and is considering spinal cord stimulator.  02/15/24 pt needs f/u for continued refills

## 2022-08-30 ENCOUNTER — Other Ambulatory Visit: Payer: Self-pay | Admitting: Physical Medicine & Rehabilitation

## 2022-09-21 DIAGNOSIS — M48062 Spinal stenosis, lumbar region with neurogenic claudication: Secondary | ICD-10-CM | POA: Diagnosis not present

## 2022-09-21 DIAGNOSIS — Z6830 Body mass index (BMI) 30.0-30.9, adult: Secondary | ICD-10-CM | POA: Diagnosis not present

## 2022-09-24 ENCOUNTER — Encounter: Payer: Medicare Other | Admitting: Physical Medicine & Rehabilitation

## 2022-10-05 DIAGNOSIS — N3020 Other chronic cystitis without hematuria: Secondary | ICD-10-CM | POA: Diagnosis not present

## 2022-10-05 DIAGNOSIS — R972 Elevated prostate specific antigen [PSA]: Secondary | ICD-10-CM | POA: Diagnosis not present

## 2022-10-05 DIAGNOSIS — R338 Other retention of urine: Secondary | ICD-10-CM | POA: Diagnosis not present

## 2022-10-07 ENCOUNTER — Other Ambulatory Visit (HOSPITAL_COMMUNITY): Payer: Self-pay | Admitting: Neurosurgery

## 2022-10-07 DIAGNOSIS — M48062 Spinal stenosis, lumbar region with neurogenic claudication: Secondary | ICD-10-CM

## 2022-11-02 ENCOUNTER — Other Ambulatory Visit: Payer: Self-pay | Admitting: Physical Medicine & Rehabilitation

## 2022-11-03 ENCOUNTER — Ambulatory Visit (HOSPITAL_COMMUNITY)
Admission: RE | Admit: 2022-11-03 | Discharge: 2022-11-03 | Disposition: A | Discharge status: Home / Self Care | Payer: Medicare Other | Source: Ambulatory Visit | Attending: Neurosurgery | Admitting: Neurosurgery

## 2022-11-03 DIAGNOSIS — Z01818 Encounter for other preprocedural examination: Secondary | ICD-10-CM | POA: Diagnosis not present

## 2022-11-03 DIAGNOSIS — M545 Low back pain, unspecified: Secondary | ICD-10-CM | POA: Diagnosis not present

## 2022-11-03 DIAGNOSIS — M48062 Spinal stenosis, lumbar region with neurogenic claudication: Secondary | ICD-10-CM | POA: Insufficient documentation

## 2022-11-16 ENCOUNTER — Telehealth: Payer: Self-pay | Admitting: *Deleted

## 2022-11-16 NOTE — Telephone Encounter (Signed)
Called patient and he is scheduled to see Diona Browner, NP on Fri 11/27/22 at 8:50 AM. I will route to provider.

## 2022-11-16 NOTE — Telephone Encounter (Signed)
   Name: Arthur Smith  DOB: 1951-04-07  MRN: [id number]  Primary Cardiologist: Sanda Klein, MD  Chart reviewed as part of pre-operative protocol coverage. Because of [name]'s past medical history and time since last visit, he will require a follow-up in-office visit in order to better assess preoperative cardiovascular risk.  Pre-op covering staff: - Please schedule appointment and call patient to inform them. If patient already had an upcoming appointment within acceptable timeframe, please add "pre-op clearance" to the appointment notes so provider is aware. - Please contact requesting surgeon's office via preferred method (i.e, phone, fax) to inform them of need for appointment prior to surgery.   Mable Fill, Marissa Nestle, NP  11/16/2022, 3:38 PM

## 2022-11-16 NOTE — Telephone Encounter (Signed)
   Pre-operative Risk Assessment    Patient Name: Arthur Smith  DOB: 01-Jul-1951 MRN: [id number]      Request for Surgical Clearance    Procedure:   Lumbar Myelogram/CT  Date of Surgery:  Clearance TBD                                 Surgeon:  Dr. Dayton Bailiff Surgeon's Group or Practice Name:  [facility] Phone number:  [phone] Fax number:  [phone]   Type of Clearance Requested:   - Medical  - Pharmacy:  Hold Aspirin and Clopidogrel (Plavix) Not Indicated.   Type of Anesthesia:  Local    Additional requests/questions:    Signed, Greer Ee   11/16/2022, 3:27 PM

## 2022-11-27 ENCOUNTER — Encounter: Payer: Self-pay | Admitting: Nurse Practitioner

## 2022-11-27 ENCOUNTER — Ambulatory Visit: Payer: Medicare Other | Attending: Nurse Practitioner | Admitting: Nurse Practitioner

## 2022-11-27 VITALS — BP 128/76 | HR 64 | Ht 68.0 in | Wt 208.2 lb

## 2022-11-27 DIAGNOSIS — Z01810 Encounter for preprocedural cardiovascular examination: Secondary | ICD-10-CM | POA: Diagnosis not present

## 2022-11-27 DIAGNOSIS — I2510 Atherosclerotic heart disease of native coronary artery without angina pectoris: Secondary | ICD-10-CM | POA: Diagnosis not present

## 2022-11-27 DIAGNOSIS — R001 Bradycardia, unspecified: Secondary | ICD-10-CM | POA: Insufficient documentation

## 2022-11-27 DIAGNOSIS — E785 Hyperlipidemia, unspecified: Secondary | ICD-10-CM

## 2022-11-27 NOTE — Patient Instructions (Addendum)
Medication Instructions:   Your physician recommends that you continue on your current medications as directed. Please refer to the Current Medication list given to you today.  *If you need a refill on your cardiac medications before your next appointment, please call your pharmacy*  Lab Work: Bernadene Person, DNP recommends that you lab work TODAY:  CBC CMP FLP   If you have labs (blood work) drawn today and your tests are completely normal, you will receive your results only by: MyChart Message (if you have MyChart) OR A paper copy in the mail If you have any lab test that is abnormal or we need to change your treatment, we will call you to review the results.  Testing/Procedures: NONE ordered at this time of appointment   Follow-Up: At [facility], you and your health needs are our priority.  As part of our continuing mission to provide you with exceptional heart care, we have created designated Provider Care Teams.  These Care Teams include your primary Cardiologist (physician) and Advanced Practice Providers (APPs -  Physician Assistants and Nurse Practitioners) who all work together to provide you with the care you need, when you need it.   Your next appointment:   1 year(s)  Provider:   Thurmon Fair, MD     Other Instructions

## 2022-11-27 NOTE — Progress Notes (Signed)
Office Visit    Patient Name: Arthur Smith Date of Encounter: 11/27/2022  Primary Care Provider:  Kirby Funk, MD Primary Cardiologist:  Thurmon Fair, MD  Chief Complaint    72 year old male with a history of CAD s/p NSTEMI, DES x4 (p Cx, d Cx, RCA and PDA) in 2020, hyperlipidemia, arthritis, BPH, and GERD who presents for follow-up related to CAD and for preoperative cardiac evaluation.    Past Medical History    Past Medical History:  Diagnosis Date   Arthritis    BPH without obstruction/lower urinary tract symptoms    Bronchitis with asthma, acute 11/2011   CAD S/P percutaneous coronary angioplasty 11/15/2018   Severe 2 vessel obstructive CAD:. Ost-pCx 95% (DES PCI  STENT SYNERGY DES 3.5X28) &p-mCx 100% (DES PCI STENT SYNERGY DES 2.25X32). pRCA 90%, mRCA 85% & rPDA 90%.dLAD ~40%.  EF 55 to 60%.  Moderately elevated LVEDP.  No R WMA.  Plan staged PCI to 3 RCA-PDA lesions 4/1   Depression    denies   Diabetes mellitus without complication    Type II- Diet controlled   Frequency of urination    URINATION AT NIGHT, PAINFUL SLOW STREAM    GERD (gastroesophageal reflux disease)    occ   Hyperlipidemia    Neuromuscular disorder    DIZZINESS   PERIPHERAL NEUROPATHY    Neuropathy    No pertinent past medical history    RINGING RIGHT EAR   NSTEMI (non-ST elevated myocardial infarction) 11/13/2018   Recurrent upper respiratory infection (URI)    SOB CURRENT COLD   Shortness of breath    d/t "cold" 12/02/11   Past Surgical History:  Procedure Laterality Date   BACK SURGERY     BACK SURGERY  04/24/2022   Dr. Franky Macho   CARDIAC CATHETERIZATION     CORONARY STENT INTERVENTION N/A 11/15/2018   Procedure: CORONARY STENT INTERVENTION;  Surgeon: Swaziland, Peter M, MD;  Location: [facility];;  Ost-pCx 95% (DES PCI  STENT SYNERGY DES 3.5X28) &p-mCx 100% (DES PCI STENT SYNERGY DES 2.25X32)   CORONARY STENT INTERVENTION N/A 11/16/2018   Procedure: CORONARY STENT  INTERVENTION;  Surgeon: Lyn Records, MD;  Location: [facility];  Service: Cardiovascular;  Laterality: N/A;   FINGER SURGERY     RIGHT MIDDLE FINGER   HERNIA REPAIR     UMBILICAL HERNIA 2011   LEFT HEART CATH AND CORONARY ANGIOGRAPHY N/A 11/15/2018   Procedure: LEFT HEART CATH AND CORONARY ANGIOGRAPHY;  Surgeon: Swaziland, Peter M, MD;  Location: [facility];; Severe 2 vessel obstructive CAD:. Ost-pCx 95% (DES PCI) &p-mCx 100% (DES PCI). pRCA 90%, mRCA 85% & rPDA 90%.dLAD ~40%.  EF 55 to 60%.  Moderately elevated LVEDP.  No R WMA.  -- Plan staged PCI to 3 RCA-PDA lesions 4/1   LUMBAR LAMINECTOMY/DECOMPRESSION MICRODISCECTOMY  12/22/2011   Procedure: LUMBAR LAMINECTOMY/DECOMPRESSION MICRODISCECTOMY 1 LEVEL;  Surgeon: Maeola Harman, MD;  Location: [facility];  Service: Neurosurgery;  Laterality: N/A;  Thoracic ten-eleven laminectomy   LUMBAR LAMINECTOMY/DECOMPRESSION MICRODISCECTOMY Right 05/01/2014   Procedure: Right Lumbar three-four Microdiskectomy;  Surgeon: Maeola Harman, MD;  Location: [facility];  Service: Neurosurgery;  Laterality: Right;   LUMBAR LAMINECTOMY/DECOMPRESSION MICRODISCECTOMY Right 11/08/2014   Procedure: Right L4-5 L5-S1 Laminectomy;  Surgeon: Maeola Harman, MD;  Location: [facility];  Service: Neurosurgery;  Laterality: Right;  Right L4-5 L5-S1 Laminectomy   MULTIPLE TOOTH EXTRACTIONS      Allergies  Allergies  Allergen Reactions  Egg-Derived Products Diarrhea and Other (See Comments)    Egg yolks cause stomach cramps and diarrhea- can tolerate if mixed in with other foods   Lactose Intolerance (Gi) Diarrhea and Other (See Comments)    Causes stomach cramps and diarrhea     Labs/Other Studies Reviewed    The following studies were reviewed today: LHC Nov 14, 2018: Prox RCA lesion is 90% stenosed. Mid RCA lesion is 85% stenosed. RPDA lesion is 90% stenosed. Dist LAD lesion is 40% stenosed. Ost Cx to Prox Cx lesion is 95% stenosed. A drug-eluting  stent was successfully placed using a STENT SYNERGY DES 3.5X28. Prox Cx to Mid Cx lesion is 100% stenosed. Post intervention, there is a 0% residual stenosis. A drug-eluting stent was successfully placed using a STENT SYNERGY DES 2.25X32. Post intervention, there is a 0% residual stenosis. The left ventricular systolic function is normal. LV end diastolic pressure is moderately elevated. The left ventricular ejection fraction is 55-65% by visual estimate.   1. Severe 2 vessel obstructive CAD.    - 95% proximal LCx. 100% distal LCx- this is the culprit lesion    - 90% proximal RCA, 85% segmental mid RCA, 90% PDA 2. Normal LV function 3. Moderately elevated LVEDP 4. Successful PCI of the proximal and distal LCX with DES x 2.    Plan: DAPT for one year. Hydrate and observe renal function. Planned for stage PCI of the RCA and PDA in next 48 hours.    Coronary stent intervention 11/2018: A stent was successfully placed. A stent was successfully placed.   Successful RCA PCI resulting in proximal 90% stenosis, mid to distal 85% stenosis and PDA 90% stenosis being reduced to 0%, 0%, and 0% using Onyx drug-eluting stents.  TIMI grade III flow was noted.  The RCA proper was treated with 3.5 mm Onyx stents postdilated to 3.75 mm.  The PDA was treated with 2.0 Onyx stent deployed at 14 atm.   RECOMMENDATIONS:   Aspirin and Brilinta for at least 12 months.  Consider dropping aspirin after 3 months if any issue with bleeding. Aggressive risk factor modification. Anticipate discharge in a.m.    Recent Labs: 04/30/2022: ALT 12 05/08/2022: BUN 15; Creatinine, Ser 1.04; Hemoglobin 10.2; Magnesium 1.9; Platelets 417; Potassium 3.9; Sodium 139  Recent Lipid Panel    Component Value Date/Time   CHOL 126 12/20/2020 1239   TRIG 146 12/20/2020 1239   HDL 37 (L) 12/20/2020 1239   CHOLHDL 3.4 12/20/2020 1239   CHOLHDL 4.8 11/15/2018 0240   VLDL 58 (H) 11/15/2018 0240   LDLCALC 64 12/20/2020 1239     History of Present Illness    72 year old male with the above past medical history including CAD s/p NSTEMI, DES x4 (p Cx, d Cx, RCA and PDA) in 2020, hyperlipidemia, arthritis, BPH, and GERD.   He was hospitalized in 03/30/20in the setting of NSTEMI.  Cardiac catheterization revealed high-grade stenosis of both the left circumflex and RCA.  He underwent staged PCI with a total of DES x4 (p Cx, d Cx, RCA and PDA).  His beta-blocker was switched from metoprolol tartrate to metoprolol succinate 12.5 mg daily in the setting of hypotension, bradycardia.  He was last seen in the office on 03/26/2022 and was doing well from a cardiac standpoint. He was cleared for back surgery at the time.  He was seen in the ED in 07/2022 in the setting of a fall.   He presents today for follow-up accompanied by his wife and  for preoperative cardiac evaluation for lumbar myelogram/CT with Dr. Coletta Memos of [facility] with request to hold aspirin and Plavix prior to procedure. Since his last visit he has done well from a cardiac standpoint. He denies any symptoms concerning for angina, denies palpitations, dizziness, presyncope, syncope. His activity has been somewhat limited in the setting of lower back pain. Overall, he reports feeling well.    Home Medications    Current Outpatient Medications  Medication Sig Dispense Refill   albuterol (VENTOLIN HFA) 108 (90 Base) MCG/ACT inhaler Inhale 2 puffs into the lungs every 6 (six) hours as needed for wheezing or shortness of breath.     Alpha-D-Galactosidase (BEANO) TABS Take 2 tablets by mouth at bedtime.     aspirin EC 81 MG tablet Take 1 tablet (81 mg total) by mouth See admin instructions.     atorvastatin (LIPITOR) 80 MG tablet Take 1 tablet (80 mg total) by mouth at bedtime. 90 tablet 3   B Complex Vitamins (VITAMIN B COMPLEX) TABS Take 1 tablet by mouth daily.     blood glucose meter kit and supplies Dispense based on patient and  insurance preference. Use up to four times daily as directed. (FOR ICD-10 E10.9, E11.9). 1 each 0   cholecalciferol (VITAMIN D3) 25 MCG (1000 UT) tablet Take 1,000 Units by mouth daily.     clopidogrel (PLAVIX) 75 MG tablet TAKE 1 TABLET BY MOUTH EVERY DAY 90 tablet 3   Cyanocobalamin 2500 MCG TABS Take 2,500 mcg by mouth daily.     ferrous sulfate 325 (65 FE) MG EC tablet Take 325 mg by mouth daily with breakfast.     finasteride (PROSCAR) 5 MG tablet Take 5 mg by mouth daily.     fluticasone (FLONASE) 50 MCG/ACT nasal spray Place 2 sprays into both nostrils daily as needed for allergies.  2   gabapentin (NEURONTIN) 400 MG capsule TAKE 2 CAPSULES (800 MG TOTAL) BY MOUTH 4 (FOUR) TIMES DAILY. 180 capsule 4   GARLIC PO Take 1 capsule by mouth daily.     imipramine (TOFRANIL-PM) 150 MG capsule TAKE 1 CAPSULE BY MOUTH EVERYDAY AT BEDTIME 90 capsule 3   metoprolol succinate (TOPROL-XL) 25 MG 24 hr tablet Take 25 mg by mouth daily.     nitroGLYCERIN (NITROSTAT) 0.4 MG SL tablet Place 1 tablet (0.4 mg total) under the tongue every 5 (five) minutes as needed for chest pain. DON'T take within 24 hrs of Viagra 25 tablet 6   Omega-3 Fatty Acids (FISH OIL) 1000 MG CAPS Take 1,000 mg by mouth daily.     pantoprazole (PROTONIX) 40 MG tablet Take 40 mg by mouth daily before breakfast.     polyethylene glycol (MIRALAX / GLYCOLAX) 17 g packet Take 17 g by mouth 2 (two) times daily. (Patient taking differently: Take 17 g by mouth as needed for mild constipation or moderate constipation.) 14 each 0   senna-docusate (SENOKOT-S) 8.6-50 MG tablet Take 2 tablets by mouth at bedtime as needed (constipation).      tamsulosin (FLOMAX) 0.4 MG CAPS capsule Take 0.4 mg by mouth every 12 (twelve) hours.     zonisamide (ZONEGRAN) 100 MG capsule TAKE 3 CAPSULES BY MOUTH DAILY. 270 capsule 1   No current facility-administered medications for this visit.     Review of Systems    He denies chest pain, palpitations, dyspnea,  pnd, orthopnea, n, v, dizziness, syncope, edema, weight gain, or early satiety. All other systems reviewed and are otherwise  negative except as noted above.  Physical Exam    VS:  BP 128/76   Pulse 64   Ht  (1.727 m)   Wt 208 lb 3.2 oz (94.4 kg)   SpO2 98%   BMI 31.66 kg/m  GEN: Well nourished, well developed, in no acute distress. HEENT: normal. Neck: Supple, no JVD, carotid bruits, or masses. Cardiac: RRR, no murmurs, rubs, or gallops. No clubbing, cyanosis, edema.  Radials/DP/PT 2+ and equal bilaterally.  Respiratory:  Respirations regular and unlabored, clear to auscultation bilaterally. GI: Soft, nontender, nondistended, BS + x 4. MS: no deformity or atrophy. Skin: warm and dry, no rash. Neuro:  Strength and sensation are intact. Psych: Normal affect.  Accessory Clinical Findings    ECG personally reviewed by me today -sinus rhythm with sinus arrhythmia, 64 bpm- no acute changes.   Lab Results  Component Value Date   WBC 11.3 (H) 05/08/2022   HGB 10.2 (L) 05/08/2022   HCT 30.9 (L) 05/08/2022   MCV 95.1 05/08/2022   PLT 417 (H) 05/08/2022   Lab Results  Component Value Date   CREATININE 1.04 05/08/2022   BUN 15 05/08/2022   NA 139 05/08/2022   K 3.9 05/08/2022   CL 107 05/08/2022   CO2 25 05/08/2022   Lab Results  Component Value Date   ALT 12 04/30/2022   AST 32 04/30/2022   ALKPHOS 63 04/30/2022   BILITOT 1.1 04/30/2022   Lab Results  Component Value Date   CHOL 126 12/20/2020   HDL 37 (L) 12/20/2020   LDLCALC 64 12/20/2020   TRIG 146 12/20/2020   CHOLHDL 3.4 12/20/2020    Lab Results  Component Value Date   HGBA1C 5.9 (H) 04/17/2022    Assessment & Plan    1. CAD: S/p DES x4 (p Cx, d Cx, RCA and PDA) in 2020. Stable with no anginal symptoms. No indication for ischemic evaluation.  Continue aspirin, Plavix, metoprolol, Lipitor.   2. Hyperlipidemia: LDL was 67 in 12/2021.  Continue Lipitor. Will update CBC, CMET, fasting lipid panel.    3.  Bradycardia: History of bradycardia. Asymptomatic, stable. Continue metoprolol.   4. Preoperative cardiac exam: According to the Revised Cardiac Risk Index (RCRI), his Perioperative Risk of Major Cardiac Event is (%): 0.9. His Functional Capacity in METs is: 7.34 according to the Duke Activity Status Index (DASI). Therefore, based on ACC/AHA guidelines, patient would be at acceptable risk for the planned procedure without further cardiovascular testing. Per office protocol,he may hold Plavix for 5 days prior to procedure. Please resume Plavix as soon as possible postprocedure, at the discretion of the surgeon. Regarding ASA therapy, we recommend continuation of ASA throughout the perioperative period.  However, if the surgeon feels that cessation of ASA is required in the perioperative period, it may be stopped 5-7 days prior to surgery with a plan to resume it as soon as felt to be feasible from a surgical standpoint in the post-operative period. I will route this recommendation to the requesting party via Epic fax function.  5. Disposition: Follow-up in 1 year.      Joylene Grapes, NP 11/27/2022, 1:29 PM

## 2022-11-28 LAB — LIPID PANEL
Chol/HDL Ratio: 3.4 ratio (ref 0.0–5.0)
Cholesterol, Total: 144 mg/dL (ref 100–199)
HDL: 42 mg/dL (ref >39)
LDL Chol Calc (NIH): 72 mg/dL (ref 0–99)
Triglycerides: 175 mg/dL — ABNORMAL HIGH (ref 0–149)
VLDL Cholesterol Cal: 30 mg/dL (ref 5–40)

## 2022-11-28 LAB — COMPREHENSIVE METABOLIC PANEL
ALT: 27 IU/L (ref 0–44)
AST: 20 IU/L (ref 0–40)
Albumin/Globulin Ratio: 1.6 (ref 1.2–2.2)
Albumin: 4.2 g/dL (ref 3.8–4.8)
Alkaline Phosphatase: 111 IU/L (ref 44–121)
BUN/Creatinine Ratio: 20 (ref 10–24)
BUN: 22 mg/dL (ref 8–27)
Bilirubin Total: 0.4 mg/dL (ref 0.0–1.2)
CO2: 22 mmol/L (ref 20–29)
Calcium: 10 mg/dL (ref 8.6–10.2)
Chloride: 109 mmol/L — ABNORMAL HIGH (ref 96–106)
Creatinine, Ser: 1.09 mg/dL (ref 0.76–1.27)
Globulin, Total: 2.6 g/dL (ref 1.5–4.5)
Glucose: 150 mg/dL — ABNORMAL HIGH (ref 70–99)
Potassium: 5 mmol/L (ref 3.5–5.2)
Sodium: 144 mmol/L (ref 134–144)
Total Protein: 6.8 g/dL (ref 6.0–8.5)
eGFR: 73 mL/min/{1.73_m2} (ref >59)

## 2022-11-28 LAB — CBC
Hematocrit: 46.7 % (ref 37.5–51.0)
Hemoglobin: 15.4 g/dL (ref 13.0–17.7)
MCH: 31.3 pg (ref 26.6–33.0)
MCHC: 33 g/dL (ref 31.5–35.7)
MCV: 95 fL (ref 79–97)
Platelets: 261 10*3/uL (ref 150–450)
RBC: 4.92 x10E6/uL (ref 4.14–5.80)
RDW: 12 % (ref 11.6–15.4)
WBC: 7.6 10*3/uL (ref 3.4–10.8)

## 2022-11-30 ENCOUNTER — Telehealth: Payer: Self-pay

## 2022-11-30 DIAGNOSIS — E785 Hyperlipidemia, unspecified: Secondary | ICD-10-CM

## 2022-11-30 NOTE — Telephone Encounter (Signed)
Attempted to reach pt. VM not available. Will call pt back to discuss lab results.

## 2022-12-01 MED ORDER — EZETIMIBE 10 MG PO TABS: 10.0000 mg | ORAL_TABLET | Freq: Every day | ORAL | 3 refills | Status: DC

## 2022-12-01 NOTE — Telephone Encounter (Signed)
Patient is agreeable to Zetia  daily. Labs are ordered

## 2022-12-01 NOTE — Telephone Encounter (Signed)
Patient is returning call to discuss results. 

## 2022-12-01 NOTE — Addendum Note (Signed)
Addended by: Judene Companion on: 12/01/2022 12:37 PM   Modules accepted: Orders

## 2022-12-18 ENCOUNTER — Other Ambulatory Visit: Payer: Self-pay | Admitting: Physical Medicine & Rehabilitation

## 2023-01-05 ENCOUNTER — Other Ambulatory Visit: Payer: Self-pay | Admitting: Physical Medicine & Rehabilitation

## 2023-01-05 NOTE — Telephone Encounter (Signed)
ordered

## 2023-01-26 ENCOUNTER — Other Ambulatory Visit: Payer: Self-pay | Admitting: Cardiovascular Disease

## 2023-02-03 ENCOUNTER — Other Ambulatory Visit (HOSPITAL_COMMUNITY): Payer: Self-pay | Admitting: Neurosurgery

## 2023-02-03 ENCOUNTER — Other Ambulatory Visit: Payer: Self-pay | Admitting: Neurosurgery

## 2023-02-03 DIAGNOSIS — M48062 Spinal stenosis, lumbar region with neurogenic claudication: Secondary | ICD-10-CM

## 2023-02-10 ENCOUNTER — Ambulatory Visit (HOSPITAL_COMMUNITY)
Admission: RE | Admit: 2023-02-10 | Discharge: 2023-02-10 | Disposition: A | Discharge status: Home / Self Care | Payer: Medicare Other | Source: Ambulatory Visit | Attending: Neurosurgery | Admitting: Neurosurgery

## 2023-02-10 ENCOUNTER — Ambulatory Visit (HOSPITAL_COMMUNITY)
Admission: RE | Admit: 2023-02-10 | Discharge: 2023-02-10 | Discharge status: Home / Self Care | Payer: Medicare Other | Source: Ambulatory Visit | Attending: Neurosurgery | Admitting: Neurosurgery

## 2023-02-10 DIAGNOSIS — M48062 Spinal stenosis, lumbar region with neurogenic claudication: Secondary | ICD-10-CM

## 2023-02-10 DIAGNOSIS — M47816 Spondylosis without myelopathy or radiculopathy, lumbar region: Secondary | ICD-10-CM | POA: Diagnosis not present

## 2023-02-10 LAB — GLUCOSE, CAPILLARY
Glucose-Capillary: 113 mg/dL — ABNORMAL HIGH (ref 70–99)
Glucose-Capillary: 107 mg/dL — ABNORMAL HIGH (ref 70–99)

## 2023-02-10 MED ORDER — IOHEXOL 180 MG/ML  SOLN
20.0000 mL | Freq: Once | INTRAMUSCULAR | Status: AC | PRN
  Administered 2023-02-10: 20 mL via INTRATHECAL

## 2023-02-10 MED ORDER — IOHEXOL 350 MG/ML SOLN: 75.0000 mL | Freq: Once | INTRAVENOUS | Status: DC | PRN

## 2023-02-10 MED ORDER — DIAZEPAM 5 MG PO TABS: 10.0000 mg | ORAL_TABLET | Freq: Once | ORAL | Status: DC

## 2023-02-10 MED ORDER — OXYCODONE HCL 5 MG PO TABS
5.0000 mg | ORAL_TABLET | ORAL | Status: DC | PRN
  Filled 2023-02-10: qty 2

## 2023-02-10 MED ORDER — ONDANSETRON HCL 4 MG/2ML IJ SOLN: 4.0000 mg | Freq: Four times a day (QID) | INTRAMUSCULAR | Status: DC | PRN

## 2023-02-10 MED ORDER — LIDOCAINE HCL (PF) 1 % IJ SOLN
5.0000 mL | Freq: Once | INTRAMUSCULAR | Status: AC
  Administered 2023-02-10: 5 mL via INTRADERMAL

## 2023-02-25 DIAGNOSIS — M48062 Spinal stenosis, lumbar region with neurogenic claudication: Secondary | ICD-10-CM | POA: Diagnosis not present

## 2023-02-25 DIAGNOSIS — M961 Postlaminectomy syndrome, not elsewhere classified: Secondary | ICD-10-CM | POA: Diagnosis not present

## 2023-02-25 DIAGNOSIS — Z6833 Body mass index (BMI) 33.0-33.9, adult: Secondary | ICD-10-CM | POA: Diagnosis not present

## 2023-03-29 DIAGNOSIS — R338 Other retention of urine: Secondary | ICD-10-CM | POA: Diagnosis not present

## 2023-03-31 DIAGNOSIS — M961 Postlaminectomy syndrome, not elsewhere classified: Secondary | ICD-10-CM | POA: Diagnosis not present

## 2023-03-31 DIAGNOSIS — Z6832 Body mass index (BMI) 32.0-32.9, adult: Secondary | ICD-10-CM | POA: Diagnosis not present

## 2023-04-01 ENCOUNTER — Emergency Department (HOSPITAL_COMMUNITY)
Admission: EM | Admit: 2023-04-01 | Discharge: 2023-04-01 | Disposition: A | Discharge status: Home / Self Care | Payer: Medicare Other | Attending: Emergency Medicine | Admitting: Emergency Medicine

## 2023-04-01 ENCOUNTER — Emergency Department (HOSPITAL_COMMUNITY): Payer: Medicare Other

## 2023-04-01 ENCOUNTER — Encounter (HOSPITAL_COMMUNITY): Payer: Self-pay

## 2023-04-01 DIAGNOSIS — N401 Enlarged prostate with lower urinary tract symptoms: Secondary | ICD-10-CM | POA: Diagnosis not present

## 2023-04-01 DIAGNOSIS — Z7982 Long term (current) use of aspirin: Secondary | ICD-10-CM | POA: Diagnosis not present

## 2023-04-01 DIAGNOSIS — R442 Other hallucinations: Secondary | ICD-10-CM | POA: Diagnosis not present

## 2023-04-01 DIAGNOSIS — T40711A Poisoning by cannabis, accidental (unintentional), initial encounter: Secondary | ICD-10-CM | POA: Insufficient documentation

## 2023-04-01 DIAGNOSIS — E860 Dehydration: Secondary | ICD-10-CM | POA: Diagnosis not present

## 2023-04-01 DIAGNOSIS — Z87891 Personal history of nicotine dependence: Secondary | ICD-10-CM | POA: Insufficient documentation

## 2023-04-01 DIAGNOSIS — R410 Disorientation, unspecified: Secondary | ICD-10-CM | POA: Diagnosis not present

## 2023-04-01 DIAGNOSIS — F12921 Cannabis use, unspecified with intoxication delirium: Secondary | ICD-10-CM

## 2023-04-01 DIAGNOSIS — Z7902 Long term (current) use of antithrombotics/antiplatelets: Secondary | ICD-10-CM | POA: Diagnosis not present

## 2023-04-01 DIAGNOSIS — G459 Transient cerebral ischemic attack, unspecified: Secondary | ICD-10-CM | POA: Diagnosis not present

## 2023-04-01 DIAGNOSIS — R4781 Slurred speech: Secondary | ICD-10-CM | POA: Diagnosis not present

## 2023-04-01 DIAGNOSIS — K5730 Diverticulosis of large intestine without perforation or abscess without bleeding: Secondary | ICD-10-CM | POA: Diagnosis not present

## 2023-04-01 DIAGNOSIS — I2510 Atherosclerotic heart disease of native coronary artery without angina pectoris: Secondary | ICD-10-CM | POA: Diagnosis not present

## 2023-04-01 DIAGNOSIS — R278 Other lack of coordination: Secondary | ICD-10-CM | POA: Diagnosis not present

## 2023-04-01 DIAGNOSIS — R29810 Facial weakness: Secondary | ICD-10-CM | POA: Diagnosis not present

## 2023-04-01 DIAGNOSIS — E119 Type 2 diabetes mellitus without complications: Secondary | ICD-10-CM | POA: Diagnosis not present

## 2023-04-01 DIAGNOSIS — R29818 Other symptoms and signs involving the nervous system: Secondary | ICD-10-CM | POA: Diagnosis not present

## 2023-04-01 DIAGNOSIS — R443 Hallucinations, unspecified: Secondary | ICD-10-CM | POA: Diagnosis present

## 2023-04-01 LAB — URINALYSIS, ROUTINE W REFLEX MICROSCOPIC
Bilirubin Urine: NEGATIVE
Glucose, UA: NEGATIVE mg/dL
Hgb urine dipstick: NEGATIVE
Ketones, ur: NEGATIVE mg/dL
Leukocytes,Ua: NEGATIVE
Nitrite: NEGATIVE
Protein, ur: NEGATIVE mg/dL
Specific Gravity, Urine: 1.01 (ref 1.005–1.030)
pH: 7 (ref 5.0–8.0)

## 2023-04-01 LAB — COMPREHENSIVE METABOLIC PANEL
ALT: 80 U/L — ABNORMAL HIGH (ref 0–44)
AST: 42 U/L — ABNORMAL HIGH (ref 15–41)
Albumin: 4 g/dL (ref 3.5–5.0)
Alkaline Phosphatase: 89 U/L (ref 38–126)
Anion gap: 9 (ref 5–15)
BUN: 17 mg/dL (ref 8–23)
CO2: 22 mmol/L (ref 22–32)
Calcium: 9.1 mg/dL (ref 8.9–10.3)
Chloride: 105 mmol/L (ref 98–111)
Creatinine, Ser: 1.32 mg/dL — ABNORMAL HIGH (ref 0.61–1.24)
GFR, Estimated: 58 mL/min — ABNORMAL LOW (ref >60)
Glucose, Bld: 149 mg/dL — ABNORMAL HIGH (ref 70–99)
Potassium: 4.3 mmol/L (ref 3.5–5.1)
Sodium: 136 mmol/L (ref 135–145)
Total Bilirubin: 1.3 mg/dL — ABNORMAL HIGH (ref 0.3–1.2)
Total Protein: 6.8 g/dL (ref 6.5–8.1)

## 2023-04-01 LAB — I-STAT CHEM 8, ED
BUN: 18 mg/dL (ref 8–23)
Calcium, Ion: 1.24 mmol/L (ref 1.15–1.40)
Chloride: 104 mmol/L (ref 98–111)
Creatinine, Ser: 1.4 mg/dL — ABNORMAL HIGH (ref 0.61–1.24)
Glucose, Bld: 147 mg/dL — ABNORMAL HIGH (ref 70–99)
HCT: 41 % (ref 39.0–52.0)
Hemoglobin: 13.9 g/dL (ref 13.0–17.0)
Potassium: 4.2 mmol/L (ref 3.5–5.1)
Sodium: 138 mmol/L (ref 135–145)
TCO2: 22 mmol/L (ref 22–32)

## 2023-04-01 LAB — RAPID URINE DRUG SCREEN, HOSP PERFORMED
Amphetamines: NOT DETECTED
Barbiturates: NOT DETECTED
Benzodiazepines: NOT DETECTED
Cocaine: NOT DETECTED
Opiates: NOT DETECTED
Tetrahydrocannabinol: POSITIVE — AB

## 2023-04-01 LAB — CBC
HCT: 41.9 % (ref 39.0–52.0)
Hemoglobin: 13.9 g/dL (ref 13.0–17.0)
MCH: 32.2 pg (ref 26.0–34.0)
MCHC: 33.2 g/dL (ref 30.0–36.0)
MCV: 97 fL (ref 80.0–100.0)
Platelets: 225 10*3/uL (ref 150–400)
RBC: 4.32 MIL/uL (ref 4.22–5.81)
RDW: 13.2 % (ref 11.5–15.5)
WBC: 7.8 10*3/uL (ref 4.0–10.5)
nRBC: 0 % (ref 0.0–0.2)

## 2023-04-01 LAB — DIFFERENTIAL
Abs Immature Granulocytes: 0.02 10*3/uL (ref 0.00–0.07)
Basophils Absolute: 0 10*3/uL (ref 0.0–0.1)
Basophils Relative: 1 %
Eosinophils Absolute: 0.1 10*3/uL (ref 0.0–0.5)
Eosinophils Relative: 1 %
Immature Granulocytes: 0 %
Lymphocytes Relative: 21 %
Lymphs Abs: 1.7 10*3/uL (ref 0.7–4.0)
Monocytes Absolute: 0.6 10*3/uL (ref 0.1–1.0)
Monocytes Relative: 8 %
Neutro Abs: 5.4 10*3/uL (ref 1.7–7.7)
Neutrophils Relative %: 69 %

## 2023-04-01 MED ORDER — LACTATED RINGERS IV BOLUS
1000.0000 mL | Freq: Once | INTRAVENOUS | Status: AC
  Administered 2023-04-01: 1000 mL via INTRAVENOUS

## 2023-04-01 NOTE — Discharge Instructions (Addendum)
We evaluated you for your slurred speech and confusion.  Your symptoms were most likely caused by accidental ingestion of THC, the active component of marijuana.  Your urinalysis did show signs of THC. CBD alone would not cause this test result. Your gabapentin dosage also probably contributed to this episode.  You were evaluated by the neurologist and they felt your symptoms were not due to a stroke.  Your laboratory testing was overall reassuring and showed only mild dehydration.  You did receive IV fluids.  Please do not smoke the CBD product that you bought earlier.  Please also discuss with your neurologist decreasing your dosage of gabapentin as this can cause the type of tremor that you have been experiencing.  If you develop any new symptoms such as signs of a stroke including facial droop, trouble swallowing, visual disturbance, weakness on one side of your body, dizziness, or any other new symptoms please return to the emergency department for reassessment.

## 2023-04-01 NOTE — Progress Notes (Signed)
Orthopedic Tech Progress Note Patient Details:  Arthur Smith October 16, 1950 [id number]  Patient ID: Arthur Smith, male   DOB: October 22, 1950, 72 y.o.   MRN: [id number] Level 2 trauma, not needed.  L  04/01/2023, 10:12 PM

## 2023-04-01 NOTE — ED Notes (Signed)
Neurologist arrived. 

## 2023-04-01 NOTE — ED Provider Notes (Signed)
[facility] EMERGENCY DEPARTMENT AT  [facility] Provider Note  CSN: [id number] Arrival date & time: 04/01/23 1940  Chief Complaint(s) Neurologic Problem  HPI Arthur Smith is a 72 y.o. male history of coronary artery disease, diabetes, hyperlipidemia, peripheral neuropathy presented to the emergency department with speech abnormality.  Apparently paramedics were called by the patient's daughter and she was concerned that he was having some right-sided facial droop, speech abnormality.  Began around 4:20 PM.  Patient does report that his speech feels slurred and then his mouth feels dry.  Denies any weakness, numbness or tingling, trouble swallowing, vision changes, chest pain, shortness of breath, or other acute symptoms.  Paramedics did not see any facial droop on their examination.   Past Medical History Past Medical History:  Diagnosis Date   Arthritis    BPH without obstruction/lower urinary tract symptoms    Bronchitis with asthma, acute 11/2011   CAD S/P percutaneous coronary angioplasty 11/15/2018   Severe 2 vessel obstructive CAD:. Ost-pCx 95% (DES PCI  STENT SYNERGY DES 3.5X28) &p-mCx 100% (DES PCI STENT SYNERGY DES 2.25X32). pRCA 90%, mRCA 85% & rPDA 90%.dLAD ~40%.  EF 55 to 60%.  Moderately elevated LVEDP.  No R WMA.  Plan staged PCI to 3 RCA-PDA lesions 4/1   Depression    denies   Diabetes mellitus without complication (HCC)    Type II- Diet controlled   Frequency of urination    URINATION AT NIGHT, PAINFUL SLOW STREAM    GERD (gastroesophageal reflux disease)    occ   Hyperlipidemia    Neuromuscular disorder (HCC)    DIZZINESS   PERIPHERAL NEUROPATHY    Neuropathy    No pertinent past medical history    RINGING RIGHT EAR   NSTEMI (non-ST elevated myocardial infarction) (HCC) 11/13/2018   Recurrent upper respiratory infection (URI)    SOB CURRENT COLD   Shortness of breath    d/t "cold" 12/02/11   Patient Active Problem List   Diagnosis Date Noted    Constipation 05/04/2022   Lumbar myelopathy (HCC) 04/29/2022   Acute kidney injury (HCC)    Coronary artery disease involving coronary bypass graft of native heart without angina pectoris    Gastroesophageal reflux disease    Hyperkalemia    Type 2 diabetes mellitus with diabetic polyneuropathy, without long-term current use of insulin (HCC)    Pressure injury of skin 04/27/2022   Lumbar back pain 04/24/2022   Spondylolisthesis of lumbar region 04/24/2022   Spinal stenosis of lumbar region    ABLA (acute blood loss anemia)    Hypotension    Diabetes mellitus type 2, uncontrolled 12/03/2019   Duodenitis 12/02/2019   Renal insufficiency 12/02/2019   BPH with obstruction/lower urinary tract symptoms 11/18/2018   Obesity 11/18/2018   Hyperlipidemia LDL goal <70 11/15/2018   CAD S/P percutaneous coronary angioplasty 11/15/2018   Acute urinary retention 11/15/2018   Hyperglycemia 11/15/2018   NSTEMI (non-ST elevated myocardial infarction) (HCC) 11/14/2018   Back pain 09/02/2018   Lumbar stenosis with neurogenic claudication 11/08/2014   Herniated lumbar intervertebral disc 05/01/2014   Hereditary and idiopathic peripheral neuropathy 05/02/2013   Thoracic root lesions, not elsewhere classified 05/02/2013   Home Medication(s) Prior to Admission medications   Medication Sig Start Date End Date Taking? Authorizing Provider  albuterol (VENTOLIN HFA) 108 (90 Base) MCG/ACT inhaler Inhale 2 puffs into the lungs every 6 (six) hours as needed for wheezing or shortness of breath.    [provider]  Alpha-D-Galactosidase Charlyne Quale)  TABS Take 2 tablets by mouth at bedtime.    [provider]  aspirin EC 81 MG tablet Take 1 tablet (81 mg total) by mouth See admin instructions. 11/17/18   Arty Baumgartner, NP  atorvastatin (LIPITOR) 80 MG tablet Take 1 tablet (80 mg total) by mouth at bedtime. 02/08/19   Croitoru, Mihai, MD  B Complex Vitamins (VITAMIN B COMPLEX) TABS Take 1 tablet by  mouth daily.    [provider]  blood glucose meter kit and supplies Dispense based on patient and insurance preference. Use up to four times daily as directed. (FOR ICD-10 E10.9, E11.9). 12/03/19   Briant Cedar, MD  cholecalciferol (VITAMIN D3) 25 MCG (1000 UT) tablet Take 1,000 Units by mouth daily.    [provider]  clopidogrel (PLAVIX) 75 MG tablet TAKE 1 TABLET BY MOUTH EVERY DAY 07/29/22   Croitoru, Mihai, MD  Cyanocobalamin 2500 MCG TABS Take 2,500 mcg by mouth daily.    [provider]  ezetimibe (ZETIA) 10 MG tablet Take 1 tablet (10 mg total) by mouth daily. 12/01/22 12/01/23  Joylene Grapes, NP  ferrous sulfate 325 (65 FE) MG EC tablet Take 325 mg by mouth daily with breakfast.    [provider]  finasteride (PROSCAR) 5 MG tablet Take 5 mg by mouth daily.    [provider]  fluticasone (FLONASE) 50 MCG/ACT nasal spray Place 2 sprays into both nostrils daily as needed for allergies. 05/01/22   Love, Evlyn Kanner, PA-C  gabapentin (NEURONTIN) 400 MG capsule TAKE 2 CAPSULES BY MOUTH 4 TIMES A DAY 01/05/23   Fanny Dance, MD  GARLIC PO Take 1 capsule by mouth daily.    [provider]  imipramine (TOFRANIL-PM) 150 MG capsule TAKE 1 CAPSULE BY MOUTH EVERYDAY AT BEDTIME 06/29/22   Sater, Pearletha Furl, MD  metoprolol succinate (TOPROL-XL) 25 MG 24 hr tablet Take 25 mg by mouth daily. 06/27/22   [provider]  nitroGLYCERIN (NITROSTAT) 0.4 MG SL tablet PLACE 1 TABLET (0.4 MG TOTAL) UNDER THE TONGUE EVERY 5 (FIVE) MINUTES AS NEEDED FOR CHEST PAIN. DON'T TAKE WITHIN 24 HRS OF VIAGRA 01/26/23   Croitoru, Mihai, MD  Omega-3 Fatty Acids (FISH OIL) 1000 MG CAPS Take 1,000 mg by mouth daily.    [provider]  pantoprazole (PROTONIX) 40 MG tablet Take 40 mg by mouth daily before breakfast.    [provider]  polyethylene glycol (MIRALAX / GLYCOLAX) 17 g packet Take 17 g by mouth 2 (two) times daily. Patient taking  differently: Take 17 g by mouth as needed for mild constipation or moderate constipation. 05/01/22   Love, Evlyn Kanner, PA-C  senna-docusate (SENOKOT-S) 8.6-50 MG tablet Take 2 tablets by mouth at bedtime as needed (constipation).     [provider]  tamsulosin (FLOMAX) 0.4 MG CAPS capsule Take 0.4 mg by mouth every 12 (twelve) hours. 12/05/20   [provider]  zonisamide (ZONEGRAN) 100 MG capsule TAKE 3 CAPSULES BY MOUTH EVERY DAY 12/21/22   Fanny Dance, MD  Past Surgical History Past Surgical History:  Procedure Laterality Date   BACK SURGERY     BACK SURGERY  04/24/2022   Dr. Franky Macho   CARDIAC CATHETERIZATION     CORONARY STENT INTERVENTION N/A 11/15/2018   Procedure: CORONARY STENT INTERVENTION;  Surgeon: Swaziland, Peter M, MD;  Location: [facility] INVASIVE CV LAB;;  Ost-pCx 95% (DES PCI  STENT SYNERGY DES 3.5X28) &p-mCx 100% (DES PCI STENT SYNERGY DES 2.25X32)   CORONARY STENT INTERVENTION N/A 11/16/2018   Procedure: CORONARY STENT INTERVENTION;  Surgeon: Lyn Records, MD;  Location: [facility] INVASIVE CV LAB;  Service: Cardiovascular;  Laterality: N/A;   FINGER SURGERY     RIGHT MIDDLE FINGER   HERNIA REPAIR     UMBILICAL HERNIA 2011   LEFT HEART CATH AND CORONARY ANGIOGRAPHY N/A 11/15/2018   Procedure: LEFT HEART CATH AND CORONARY ANGIOGRAPHY;  Surgeon: Swaziland, Peter M, MD;  Location: [facility] INVASIVE CV LAB;; Severe 2 vessel obstructive CAD:. Ost-pCx 95% (DES PCI) &p-mCx 100% (DES PCI). pRCA 90%, mRCA 85% & rPDA 90%.dLAD ~40%.  EF 55 to 60%.  Moderately elevated LVEDP.  No R WMA.  -- Plan staged PCI to 3 RCA-PDA lesions 4/1   LUMBAR LAMINECTOMY/DECOMPRESSION MICRODISCECTOMY  12/22/2011   Procedure: LUMBAR LAMINECTOMY/DECOMPRESSION MICRODISCECTOMY 1 LEVEL;  Surgeon: Maeola Harman, MD;  Location: [facility] NEURO ORS;  Service: Neurosurgery;  Laterality: N/A;  Thoracic  ten-eleven laminectomy   LUMBAR LAMINECTOMY/DECOMPRESSION MICRODISCECTOMY Right 05/01/2014   Procedure: Right Lumbar three-four Microdiskectomy;  Surgeon: Maeola Harman, MD;  Location: [facility] NEURO ORS;  Service: Neurosurgery;  Laterality: Right;   LUMBAR LAMINECTOMY/DECOMPRESSION MICRODISCECTOMY Right 11/08/2014   Procedure: Right L4-5 L5-S1 Laminectomy;  Surgeon: Maeola Harman, MD;  Location: [facility] NEURO ORS;  Service: Neurosurgery;  Laterality: Right;  Right L4-5 L5-S1 Laminectomy   MULTIPLE TOOTH EXTRACTIONS     Family History Family History  Problem Relation Age of Onset   Cancer Mother    Stroke Father    Neuropathy Brother     Social History Social History   Tobacco Use   Smoking status: Former    Current packs/day: 0.00    Average packs/day: 2.0 packs/day for 15.0 years (30.0 ttl pk-yrs)    Types: Cigarettes    Start date: 04/24/1969    Quit date: 04/24/1984    Years since quitting: 38.9   Smokeless tobacco: Never  Vaping Use   Vaping status: Never Used  Substance Use Topics   Alcohol use: Yes    Comment: occ   Drug use: No   Allergies Egg-derived products and Lactose intolerance (gi)  Review of Systems Review of Systems  All other systems reviewed and are negative.   Physical Exam Vital Signs  I have reviewed the triage vital signs BP 130/82   Pulse 86   Temp 97.7 F (36.5 C) (Oral)   Resp 15   SpO2 96%  Physical Exam Vitals and nursing note reviewed.  Constitutional:      General: He is not in acute distress.    Appearance: Normal appearance.  HENT:     Mouth/Throat:     Mouth: Mucous membranes are moist.  Eyes:     Conjunctiva/sclera: Conjunctivae normal.  Cardiovascular:     Rate and Rhythm: Normal rate and regular rhythm.  Pulmonary:     Effort: Pulmonary effort is normal. No respiratory distress.     Breath sounds: Normal breath sounds.  Abdominal:     General: Abdomen is flat.     Palpations: Abdomen is soft.  Tenderness: There is no abdominal  tenderness.  Musculoskeletal:     Right lower leg: No edema.     Left lower leg: No edema.  Skin:    General: Skin is warm and dry.     Capillary Refill: Capillary refill takes less than 2 seconds.  Neurological:     Mental Status: He is alert and oriented to person, place, and time. Mental status is at baseline.     Comments: Cranial nerves II through XII intact with exception of slurred speech, strength 5 out of 5 in the bilateral upper and lower extremities, no sensory deficit to light touch, no dysmetria on finger-nose-finger testing, ambulatory with steady gait.   Psychiatric:        Mood and Affect: Mood normal.        Behavior: Behavior normal.     ED Results and Treatments Labs (all labs ordered are listed, but only abnormal results are displayed) Labs Reviewed  COMPREHENSIVE METABOLIC PANEL - Abnormal; Notable for the following components:      Result Value   Glucose, Bld 149 (*)    Creatinine, Ser 1.32 (*)    AST 42 (*)    ALT 80 (*)    Total Bilirubin 1.3 (*)    GFR, Estimated 58 (*)    All other components within normal limits  RAPID URINE DRUG SCREEN, HOSP PERFORMED - Abnormal; Notable for the following components:   Tetrahydrocannabinol POSITIVE (*)    All other components within normal limits  I-STAT CHEM 8, ED - Abnormal; Notable for the following components:   Creatinine, Ser 1.40 (*)    Glucose, Bld 147 (*)    All other components within normal limits  CBC  DIFFERENTIAL  URINALYSIS, ROUTINE W REFLEX MICROSCOPIC  ETHANOL  PROTIME-INR  APTT                                                                                                                          Radiology CT HEAD CODE STROKE WO CONTRAST  Result Date: 04/01/2023 CLINICAL DATA:  Code stroke.  Neuro deficit, acute, stroke suspected EXAM: CT HEAD WITHOUT CONTRAST TECHNIQUE: Contiguous axial images were obtained from the base of the skull through the vertex without intravenous contrast.  RADIATION DOSE REDUCTION: This exam was performed according to the departmental dose-optimization program which includes automated exposure control, adjustment of the mA and/or kV according to patient size and/or use of iterative reconstruction technique. COMPARISON:  None Available. FINDINGS: Brain: No evidence of acute infarction, hemorrhage, hydrocephalus, extra-axial collection or mass lesion/mass effect. Vascular: No hyperdense vessel or unexpected calcification. Skull: Normal. Negative for fracture or focal lesion. Sinuses/Orbits: No acute finding. ASPECTS [facility] Stroke Program Early CT Score) total score (0-10 with 10 being normal): 10. IMPRESSION: 1. No evidence of acute intracranial abnormality. 2. ASPECTS is 10. Code stroke imaging results were communicated on 04/01/2023 at 8:24 pm to provider [facility] via secure text paging. Electronically Signed   By: Feliberto Harts  M.D.   On: 04/01/2023 20:24    Pertinent labs & imaging results that were available during my care of the patient were reviewed by me and considered in my medical decision making (see MDM for details).  Medications Ordered in ED Medications  lactated ringers bolus 1,000 mL (0 mLs Intravenous Stopped 04/01/23 2232)                                                                                                                                     Procedures Procedures  (including critical care time)  Medical Decision Making / ED Course   MDM:  72 year old male presenting to the emergency department with slurred speech, possible right facial droop.  Code stroke was activated initially given the slurred speech and patient was in the TNK window.  He was taken for CT scan and neurology evaluated the patient.  On further the patient history the patient admitted to vaping CBD and is also on a significant dose of gabapentin.  Discussed with Dr. Karma Lew who feels that the patient's symptoms are most consistent with  intoxication and gabapentin dose being too high.  Does not think patient needs further imaging such as MRI.  Will obtain remainder of labs.  CT head negative for acute bleeding or intracranial process.  Clinical Course as of 04/01/23 2333  Thu Apr 01, 2023  2328 Labs with mild AKI.  Patient received IV fluids.  He does feel little better.  His UDS is positive for THC which would not be expected with CBD only product so suspect patient was inadvertently intoxicated with marijuana product.  Would explain his dry mouth and slurred speech.  His wife also discussed with his neurologist decreasing his gabapentin dose given symptoms. Will discharge patient to home. All questions answered. Patient comfortable with plan of discharge. Return precautions discussed with patient and specified on the after visit summary.  [WS]    Clinical Course User Index [WS] Lonell Grandchild, MD     Additional history obtained: -Additional history obtained from ems and spouse -External records from outside source obtained and reviewed including: Chart review including previous notes, labs, imaging, consultation notes including previous ER notes   Lab Tests: -I ordered, reviewed, and interpreted labs.   The pertinent results include:   Labs Reviewed  COMPREHENSIVE METABOLIC PANEL - Abnormal; Notable for the following components:      Result Value   Glucose, Bld 149 (*)    Creatinine, Ser 1.32 (*)    AST 42 (*)    ALT 80 (*)    Total Bilirubin 1.3 (*)    GFR, Estimated 58 (*)    All other components within normal limits  RAPID URINE DRUG SCREEN, HOSP PERFORMED - Abnormal; Notable for the following components:   Tetrahydrocannabinol POSITIVE (*)    All other components within normal limits  I-STAT CHEM 8, ED - Abnormal; Notable for the following components:  Creatinine, Ser 1.40 (*)    Glucose, Bld 147 (*)    All other components within normal limits  CBC  DIFFERENTIAL  URINALYSIS, ROUTINE W REFLEX  MICROSCOPIC  ETHANOL  PROTIME-INR  APTT    Notable for +THC on UDS, mild AKI  EKG   EKG Interpretation Date/Time:  Thursday April 01 2023 19:59:28 EDT Ventricular Rate:  91 PR Interval:  194 QRS Duration:  119 QT Interval:  359 QTC Calculation: 442 R Axis:   76  Text Interpretation: Sinus rhythm Incomplete right bundle branch block Confirmed by Alvino Blood ([id number]) on 04/01/2023 11:32:04 PM         Imaging Studies ordered: I ordered imaging studies including CT head On my interpretation imaging demonstrates no acute process I independently visualized and interpreted imaging. I agree with the radiologist interpretation   Medicines ordered and prescription drug management: Meds ordered this encounter  Medications   lactated ringers bolus 1,000 mL    -I have reviewed the patients home medicines and have made adjustments as needed   Consultations Obtained: I requested consultation with the neurology,  and discussed lab and imaging findings as well as pertinent plan - they recommend: not concerning for stroke, suspect intoxication   Cardiac Monitoring: The patient was maintained on a cardiac monitor.  I personally viewed and interpreted the cardiac monitored which showed an underlying rhythm of: NSR  Social Determinants of Health:  Diagnosis or treatment significantly limited by social determinants of health: obesity   Reevaluation: After the interventions noted above, I reevaluated the patient and found that their symptoms have improved  Co morbidities that complicate the patient evaluation  Past Medical History:  Diagnosis Date   Arthritis    BPH without obstruction/lower urinary tract symptoms    Bronchitis with asthma, acute 11/2011   CAD S/P percutaneous coronary angioplasty 11/15/2018   Severe 2 vessel obstructive CAD:. Ost-pCx 95% (DES PCI  STENT SYNERGY DES 3.5X28) &p-mCx 100% (DES PCI STENT SYNERGY DES 2.25X32). pRCA 90%, mRCA 85% & rPDA 90%.dLAD  ~40%.  EF 55 to 60%.  Moderately elevated LVEDP.  No R WMA.  Plan staged PCI to 3 RCA-PDA lesions 4/1   Depression    denies   Diabetes mellitus without complication (HCC)    Type II- Diet controlled   Frequency of urination    URINATION AT NIGHT, PAINFUL SLOW STREAM    GERD (gastroesophageal reflux disease)    occ   Hyperlipidemia    Neuromuscular disorder (HCC)    DIZZINESS   PERIPHERAL NEUROPATHY    Neuropathy    No pertinent past medical history    RINGING RIGHT EAR   NSTEMI (non-ST elevated myocardial infarction) (HCC) 11/13/2018   Recurrent upper respiratory infection (URI)    SOB CURRENT COLD   Shortness of breath    d/t "cold" 12/02/11      Dispostion: Disposition decision including need for hospitalization was considered, and patient discharged from emergency department.    Final Clinical Impression(s) / ED Diagnoses Final diagnoses:  Accidental cannabis overdose, initial encounter  Dehydration     This chart was dictated using voice recognition software.  Despite best efforts to proofread,  errors can occur which can change the documentation meaning.    Lonell Grandchild, MD 04/01/23 [phone]

## 2023-04-01 NOTE — Consult Note (Signed)
NEUROLOGY CONSULTATION NOTE   Date of service: April 01, 2023 Patient Name: Arthur Smith MRN:  [id number] DOB:  [birth year] Reason for consult: "slurred speech, hallucinations" Requesting Provider: Lonell Grandchild, MD _ _ _   _ __   _ __ _ _  __ __   _ __   __ _  History of Present Illness  Arthur Smith is a 72 y.o. male with PMH significant for DM2, HLD, CAD, peripheral neuropathy, hx of B12 deficiency who presents with slurred speech, dizziness, visual hallucinations.  Symptoms started around 1620. A code stroke was activated for concern for the acute nature of these compliants could possibly be a CVA.  CT Head w/o contrast with no acute abnormalities. Upon further questioning, patient appears slightly inebriated. Endorses smoking a lot of CBD today. He uses CBD for neuropathy pain but endorsed to using a lot more today.  LKW: 1620 mRS: 2 tNKASE: not offered, low suspicion for stroke Thrombectomy: not offered, los suspicion for stroke. NIHSS components Score: Comment  1a Level of Conscious 0[x]  1[]  2[]  3[]      1b LOC Questions 0[x]  1[]  2[]       1c LOC Commands 0[x]  1[]  2[]       2 Best Gaze 0[x]  1[]  2[]       3 Visual 0[x]  1[]  2[]  3[]      4 Facial Palsy 0[x]  1[]  2[]  3[]      5a Motor Arm - left 0[x]  1[]  2[]  3[]  4[]  UN[]    5b Motor Arm - Right 0[x]  1[]  2[]  3[]  4[]  UN[]    6a Motor Leg - Left 0[x]  1[]  2[]  3[]  4[]  UN[]    6b Motor Leg - Right 0[x]  1[]  2[]  3[]  4[]  UN[]    7 Limb Ataxia 0[x]  1[]  2[]  3[]  UN[]     8 Sensory 0[]  1[]  2[]  UN[]    BL reduced sensory below the knees.  9 Best Language 0[x]  1[]  2[]  3[]      10 Dysarthria 0[]  1[x]  2[]  UN[]      11 Extinct. and Inattention 0[x]  1[]  2[]       TOTAL:       ROS   Constitutional Denies weight loss, fever and chills.   HEENT Denies changes in vision and hearing.   Respiratory Denies SOB and cough.   CV Denies palpitations and CP   GI Denies abdominal pain, nausea, vomiting and diarrhea.   GU Denies dysuria and urinary  frequency.   MSK Denies myalgia and joint pain.   Skin Denies rash and pruritus.   Neurological Denies headache and syncope.   Psychiatric Denies recent changes in mood. Denies anxiety and depression.    Past History   Past Medical History:  Diagnosis Date   Arthritis    BPH without obstruction/lower urinary tract symptoms    Bronchitis with asthma, acute 11/2011   CAD S/P percutaneous coronary angioplasty 11/15/2018   Severe 2 vessel obstructive CAD:. Ost-pCx 95% (DES PCI  STENT SYNERGY DES 3.5X28) &p-mCx 100% (DES PCI STENT SYNERGY DES 2.25X32). pRCA 90%, mRCA 85% & rPDA 90%.dLAD ~40%.  EF 55 to 60%.  Moderately elevated LVEDP.  No R WMA.  Plan staged PCI to 3 RCA-PDA lesions 4/1   Depression    denies   Diabetes mellitus without complication (HCC)    Type II- Diet controlled   Frequency of urination    URINATION AT NIGHT, PAINFUL SLOW STREAM    GERD (gastroesophageal reflux disease)    occ   Hyperlipidemia    Neuromuscular disorder (HCC)  DIZZINESS   PERIPHERAL NEUROPATHY    Neuropathy    No pertinent past medical history    RINGING RIGHT EAR   NSTEMI (non-ST elevated myocardial infarction) (HCC) 11/13/2018   Recurrent upper respiratory infection (URI)    SOB CURRENT COLD   Shortness of breath    d/t "cold" 12/02/11   Past Surgical History:  Procedure Laterality Date   BACK SURGERY     BACK SURGERY  04/24/2022   Dr. Franky Macho   CARDIAC CATHETERIZATION     CORONARY STENT INTERVENTION N/A 11/15/2018   Procedure: CORONARY STENT INTERVENTION;  Surgeon: Swaziland, Peter M, MD;  Location: [facility] INVASIVE CV LAB;;  Ost-pCx 95% (DES PCI  STENT SYNERGY DES 3.5X28) &p-mCx 100% (DES PCI STENT SYNERGY DES 2.25X32)   CORONARY STENT INTERVENTION N/A 11/16/2018   Procedure: CORONARY STENT INTERVENTION;  Surgeon: Lyn Records, MD;  Location: [facility] INVASIVE CV LAB;  Service: Cardiovascular;  Laterality: N/A;   FINGER SURGERY     RIGHT MIDDLE FINGER   HERNIA REPAIR     UMBILICAL HERNIA 2011    LEFT HEART CATH AND CORONARY ANGIOGRAPHY N/A 11/15/2018   Procedure: LEFT HEART CATH AND CORONARY ANGIOGRAPHY;  Surgeon: Swaziland, Peter M, MD;  Location: [facility] INVASIVE CV LAB;; Severe 2 vessel obstructive CAD:. Ost-pCx 95% (DES PCI) &p-mCx 100% (DES PCI). pRCA 90%, mRCA 85% & rPDA 90%.dLAD ~40%.  EF 55 to 60%.  Moderately elevated LVEDP.  No R WMA.  -- Plan staged PCI to 3 RCA-PDA lesions 4/1   LUMBAR LAMINECTOMY/DECOMPRESSION MICRODISCECTOMY  12/22/2011   Procedure: LUMBAR LAMINECTOMY/DECOMPRESSION MICRODISCECTOMY 1 LEVEL;  Surgeon: Maeola Harman, MD;  Location: [facility] NEURO ORS;  Service: Neurosurgery;  Laterality: N/A;  Thoracic ten-eleven laminectomy   LUMBAR LAMINECTOMY/DECOMPRESSION MICRODISCECTOMY Right 05/01/2014   Procedure: Right Lumbar three-four Microdiskectomy;  Surgeon: Maeola Harman, MD;  Location: [facility] NEURO ORS;  Service: Neurosurgery;  Laterality: Right;   LUMBAR LAMINECTOMY/DECOMPRESSION MICRODISCECTOMY Right 11/08/2014   Procedure: Right L4-5 L5-S1 Laminectomy;  Surgeon: Maeola Harman, MD;  Location: [facility] NEURO ORS;  Service: Neurosurgery;  Laterality: Right;  Right L4-5 L5-S1 Laminectomy   MULTIPLE TOOTH EXTRACTIONS     Family History  Problem Relation Age of Onset   Cancer Mother    Stroke Father    Neuropathy Brother    Social History   Socioeconomic History   Marital status: Married    Spouse name: [name]   Number of children: 3   Years of education: 12   Highest education level: Not on file  Occupational History   Occupation: Retired  Tobacco Use   Smoking status: Former    Current packs/day: 0.00    Average packs/day: 2.0 packs/day for 15.0 years (30.0 ttl pk-yrs)    Types: Cigarettes    Start date: 04/24/1969    Quit date: 04/24/1984    Years since quitting: 38.9   Smokeless tobacco: Never  Vaping Use   Vaping status: Never Used  Substance and Sexual Activity   Alcohol use: Yes    Comment: occ   Drug use: No   Sexual activity: Not on file  Other Topics Concern    Not on file  Social History Narrative   Patient lives at home [name] his wife.    Patient has 3 adult children and 1 step son.    Patient has a high school education.    Patient is retired.    [employer]    Social Determinants of Health   Financial Resource Strain: Not on file  Food Insecurity: Not on file  Transportation Needs: Not on file  Physical Activity: Not on file  Stress: Not on file  Social Connections: Not on file   Allergies  Allergen Reactions   Egg-Derived Products Diarrhea and Other (See Comments)    Egg yolks cause stomach cramps and diarrhea- can tolerate if mixed in with other foods   Lactose Intolerance (Gi) Diarrhea and Other (See Comments)    Causes stomach cramps and diarrhea    Medications  (Not in a hospital admission)    Vitals   Vitals:   04/01/23 2001 04/01/23 2015 04/01/23 2030 04/01/23 2100  BP:  (!) 139/92 125/88   Pulse: 90 93 90 86  Resp: 16   14  Temp:      TempSrc:      SpO2: 97% 100% 99% 90%     There is no height or weight on file to calculate BMI.  Physical Exam   General: Laying comfortably in bed; in no acute distress.  HENT: Normal oropharynx and mucosa. Normal external appearance of ears and nose.  Neck: Supple, no pain or tenderness  CV: No JVD. No peripheral edema.  Pulmonary: Symmetric Chest rise. Normal respiratory effort.  Abdomen: Soft to touch, non-tender.  Ext: No cyanosis, edema, or deformity  Skin: No rash. Normal palpation of skin.   Musculoskeletal: Normal digits and nails by inspection. No clubbing.   Neurologic Examination  Mental status/Cognition: slightly drowsy with eyes partially closed, oriented to self, place, month and year, good attention.  Speech/language: dysarthric speech, fluent, comprehension intact, object naming intact, repetition intact. Cranial nerves:   CN II Slight midriasis, pupils equal and reactive to light, no VF deficits    CN III,IV,VI EOM intact, no gaze preference or deviation, no  nystagmus    CN V normal sensation in V1, V2, and V3 segments bilaterally    CN VII no asymmetry, no nasolabial fold flattening    CN VIII normal hearing to speech    CN IX & X normal palatal elevation, no uvular deviation    CN XI 5/5 head turn and 5/5 shoulder shrug bilaterally    CN XII midline tongue protrusion    Motor:  Muscle bulk: normal, tone normal, pronator drift none. tremor noted to have asterixis in BL upper extremities. Mvmt Root Nerve  Muscle Right Left Comments  SA C5/6 Ax Deltoid     EF C5/6 Mc Biceps 5 5   EE C6/7/8 Rad Triceps 5 5   WF C6/7 Med FCR     WE C7/8 PIN ECU     F Ab C8/T1 U ADM/FDI 5 5   HF L1/2/3 Fem Illopsoas 5 5 Pain in R leg but still 5/5 strength in R leg.  KE L2/3/4 Fem Quad 5 5   DF L4/5 D Peron Tib Ant 5 5   PF S1/2 Tibial Grc/Sol 5 5    Sensation:  Light touch Decreased in BL lower extremities in a stocking distribution below knees.   Pin prick    Temperature    Vibration   Proprioception    Coordination/Complex Motor:  - Finger to Nose intact BL - Heel to shin unable to do - Rapid alternating movement are slowed BL - Gait: deferred for patient safety.  Labs   CBC:  Recent Labs  Lab 04/01/23 2019 04/01/23 2032  WBC 7.8  --   NEUTROABS 5.4  --   HGB 13.9 13.9  HCT 41.9 41.0  MCV 97.0  --  PLT 225  --     Basic Metabolic Panel:  Lab Results  Component Value Date   NA 138 04/01/2023   K 4.2 04/01/2023   CO2 22 04/01/2023   GLUCOSE 147 (H) 04/01/2023   BUN 18 04/01/2023   CREATININE 1.40 (H) 04/01/2023   CALCIUM 9.1 04/01/2023   GFRNONAA 58 (L) 04/01/2023   GFRAA >60 12/03/2019   Lipid Panel:  Lab Results  Component Value Date   LDLCALC 72 11/27/2022   HgbA1c:  Lab Results  Component Value Date   HGBA1C 5.9 (H) 04/17/2022   Urine Drug Screen:     Component Value Date/Time   LABOPIA NONE DETECTED 04/01/2023 2000   COCAINSCRNUR NONE DETECTED 04/01/2023 2000   LABBENZ NONE DETECTED 04/01/2023 2000    AMPHETMU NONE DETECTED 04/01/2023 2000   THCU POSITIVE (A) 04/01/2023 2000   LABBARB NONE DETECTED 04/01/2023 2000    Alcohol Level No results found for: "ETH"  CT Head without contrast(Personally reviewed): CTH was negative for a large hypodensity concerning for a large territory infarct or hyperdensity concerning for an ICH  Impression   Arthur Smith is a 72 y.o. male with PMH significant for DM2, HLD, CAD, peripheral neuropathy, hx of B12 deficiency who presents with slurred speech, dizziness, visual hallucinations.  Combination of his symptoms do not point or localize to a deficit to a single vascular territory or a single lesion in his brain. He seems inebriated and does endorse to smoking CBD. I suspect that his slurred speech is more likely explained by intoxication rather than a stroke. His mouth is significantly dry which I think is contributing. Also visual hallucinations BL while could possibly could raise suspicion for antons babinski but both his central and peripheral vision is intact with no clear deficit.  Overall, my suspicion for stroke is low.  However, on exam, he does have significant asterixis/negative myoclonus and upon further discussion with him and his wife, he seems to be on a very high dose of gabapentin. Takes 800mg  every 6 hours which I think is probably what is causing this. I discussed with him and his wife about risks of his legs giving out or him dropping things from his hands due to asterixis and they endorse that he has been dropping things from his hand for the last few months.  I would strongly recommend cutting down gabapentin. Wife reports that this has been discussed with him in thepast but with severe neuropathy, patient is hesitant.  I recommend trying alternative treatments outpatient including seeing if he could be provided with compounded creams with gabapentin, lidocaine, diclofenac outpatient with hope  that topical applications would target his  legs and feet more effectively and reduce systemic absorption.  Recommendations  - no further inpatient neurological workup. - recommend reducing gabapentin dose with the noted asterixis or using compounded topical creams/gel in attempt to reduce systemic absorption of gabapentin. - chemistry to ensure no AKI which could result in reduced excretion of gabapentin and worsen asterixis. ______________________________________________________________________  Plan discussed with Dr. Suezanne Jacquet.  Thank you for the opportunity to take part in the care of this patient. If you have any further questions, please contact the neurology consultation attending.  Signed,  Erick Blinks [facility] _ _ _   _ __   _ __ _ _  __ __   _ __   __ _

## 2023-04-01 NOTE — ED Triage Notes (Signed)
Medic was called out for dizziness, disorganized thoughts, rt side eye/lip droop. Upon medic arrival no droop in face noted, ambulatory with 2 person assist due to neurpathy and abck problems. There a report of tremors in both hands which appear to be mild. The symptoms appeared at 1620.   Medic Vitals  136/91 90hr 134bgl 99%ra 18

## 2023-04-01 NOTE — ED Notes (Signed)
Went to CT

## 2023-04-02 ENCOUNTER — Telehealth: Payer: Self-pay | Admitting: *Deleted

## 2023-04-02 MED ORDER — GABAPENTIN 400 MG PO CAPS: 400.0000 mg | ORAL_CAPSULE | Freq: Three times a day (TID) | ORAL | 6 refills | Status: DC

## 2023-04-02 NOTE — Telephone Encounter (Signed)
Patient went to ED last night expected TIA but was side effects of Gabapentin. Patient takes 800 mg every 6 hrs. Unstable gait and myoclonic jerking. Wife would like a call back.

## 2023-04-05 DIAGNOSIS — R338 Other retention of urine: Secondary | ICD-10-CM | POA: Diagnosis not present

## 2023-04-05 DIAGNOSIS — R972 Elevated prostate specific antigen [PSA]: Secondary | ICD-10-CM | POA: Diagnosis not present

## 2023-04-05 DIAGNOSIS — N3020 Other chronic cystitis without hematuria: Secondary | ICD-10-CM | POA: Diagnosis not present

## 2023-04-06 DIAGNOSIS — F331 Major depressive disorder, recurrent, moderate: Secondary | ICD-10-CM | POA: Diagnosis not present

## 2023-04-09 ENCOUNTER — Other Ambulatory Visit: Payer: Self-pay | Admitting: Cardiovascular Disease

## 2023-04-22 ENCOUNTER — Telehealth: Payer: Self-pay | Admitting: Cardiovascular Disease

## 2023-04-22 NOTE — Telephone Encounter (Signed)
Patient states he is returning a call. 

## 2023-04-23 ENCOUNTER — Other Ambulatory Visit: Payer: Self-pay | Admitting: Cardiovascular Disease

## 2023-04-24 ENCOUNTER — Other Ambulatory Visit: Payer: Self-pay | Admitting: Neurology

## 2023-04-28 NOTE — Telephone Encounter (Signed)
Last seen on 05/27/22 No follow up scheduled

## 2023-06-15 DIAGNOSIS — M47814 Spondylosis without myelopathy or radiculopathy, thoracic region: Secondary | ICD-10-CM | POA: Diagnosis not present

## 2023-06-15 DIAGNOSIS — M4804 Spinal stenosis, thoracic region: Secondary | ICD-10-CM | POA: Diagnosis not present

## 2023-06-15 DIAGNOSIS — M961 Postlaminectomy syndrome, not elsewhere classified: Secondary | ICD-10-CM | POA: Diagnosis not present

## 2023-06-15 DIAGNOSIS — R200 Anesthesia of skin: Secondary | ICD-10-CM | POA: Diagnosis not present

## 2023-06-15 DIAGNOSIS — M546 Pain in thoracic spine: Secondary | ICD-10-CM | POA: Diagnosis not present

## 2023-06-16 DIAGNOSIS — Z23 Encounter for immunization: Secondary | ICD-10-CM | POA: Diagnosis not present

## 2023-07-07 DIAGNOSIS — Z9889 Other specified postprocedural states: Secondary | ICD-10-CM | POA: Diagnosis not present

## 2023-07-07 DIAGNOSIS — Z6832 Body mass index (BMI) 32.0-32.9, adult: Secondary | ICD-10-CM | POA: Diagnosis not present

## 2023-07-07 DIAGNOSIS — M961 Postlaminectomy syndrome, not elsewhere classified: Secondary | ICD-10-CM | POA: Diagnosis not present

## 2023-07-22 ENCOUNTER — Other Ambulatory Visit: Payer: Self-pay | Admitting: Physical Medicine & Rehabilitation

## 2023-07-22 ENCOUNTER — Other Ambulatory Visit: Payer: Self-pay | Admitting: Cardiovascular Disease

## 2023-10-20 ENCOUNTER — Other Ambulatory Visit: Payer: Self-pay | Admitting: Neurology

## 2023-11-07 ENCOUNTER — Other Ambulatory Visit: Payer: Self-pay | Admitting: Nurse Practitioner

## 2023-11-07 DIAGNOSIS — E785 Hyperlipidemia, unspecified: Secondary | ICD-10-CM

## 2023-11-25 ENCOUNTER — Telehealth: Payer: Self-pay

## 2023-11-25 NOTE — Telephone Encounter (Signed)
   Pre-operative Risk Assessment    Patient Name: Arthur Smith  DOB: 1951/05/04 MRN: [id number]   Date of last office visit: 11/27/22 [facility] MONGE, NP Date of next office visit: NONE   Request for Surgical Clearance    Procedure:   PERIPHERAL NERVE STIMULATOR PERM SX  Date of Surgery:  Clearance TBD                                Surgeon:  DR Aileen Fass Surgeon's Group or Practice Name:  [facility] Phone number:  [phone] Fax number:  [phone]   Type of Clearance Requested:   - Medical  - Pharmacy:  Hold Clopidogrel (Plavix) 7 DAY PRIOR, CONT DAY AFTER & ASPIRIN   Type of Anesthesia:  Not Indicated   Additional requests/questions:    Signed, Marlow Baars   11/25/2023, 5:12 PM

## 2023-11-25 NOTE — Telephone Encounter (Signed)
   Name: Arthur Smith  DOB: Nov 09, 1950  MRN: [id number]  Primary Cardiologist: Thurmon Fair, MD  Chart reviewed as part of pre-operative protocol coverage. Because of Arthur Smith's past medical history and time since last visit, he will require a follow-up in-office visit in order to better assess preoperative cardiovascular risk.  Pre-op covering staff: - Please schedule appointment and call patient to inform them. If patient already had an upcoming appointment within acceptable timeframe, please add "pre-op clearance" to the appointment notes so provider is aware. - Please contact requesting surgeon's office via preferred method (i.e, phone, fax) to inform them of need for appointment prior to surgery.    Napoleon Form, Leodis Rains, NP  11/25/2023, 5:20 PM

## 2023-11-25 NOTE — Telephone Encounter (Signed)
 Tried to call the pt to schedule in office preop appt, though could not leave message to call back on his vm.

## 2023-11-29 NOTE — Telephone Encounter (Signed)
 I tried to call the pt again and his vm just kept going around and around and then hangs up. I s/w the pt's wife (DPR). Pt's wife has scheduled an appt for the pt to see Marlana Silvan, NP 12/16/23. Pt's wife has been given the new address for our new building [address]   I will update all parties involved.

## 2023-11-30 ENCOUNTER — Other Ambulatory Visit: Payer: Self-pay

## 2023-11-30 MED ORDER — NITROGLYCERIN 0.4 MG SL SUBL: 0.4000 mg | SUBLINGUAL_TABLET | SUBLINGUAL | 0 refills | Status: DC | PRN

## 2023-12-02 ENCOUNTER — Other Ambulatory Visit: Payer: Self-pay | Admitting: Nurse Practitioner

## 2023-12-02 DIAGNOSIS — E785 Hyperlipidemia, unspecified: Secondary | ICD-10-CM

## 2023-12-09 ENCOUNTER — Other Ambulatory Visit: Payer: Self-pay | Admitting: Nurse Practitioner

## 2023-12-09 DIAGNOSIS — E785 Hyperlipidemia, unspecified: Secondary | ICD-10-CM

## 2023-12-15 NOTE — Progress Notes (Unsigned)
 Office Visit    Patient Name: Arthur Smith Date of Encounter: 12/16/2023  Primary Care Provider:  [facility] Cardiologist:  Luana Rumple, MD  Chief Complaint    73 year old male with a history of CAD s/p NSTEMI, DES x4 (pLCx, dLCx, RCA and PDA) in 2020, hyperlipidemia, type 2 diabetes, arthritis, BPH, and GERD who presents for follow-up related to CAD and for preoperative cardiac evaluation.    Past Medical History    Past Medical History:  Diagnosis Date   Arthritis    BPH without obstruction/lower urinary tract symptoms    Bronchitis with asthma, acute 11/2011   CAD S/P percutaneous coronary angioplasty 11/15/2018   Severe 2 vessel obstructive CAD:. Ost-pCx 95% (DES PCI  STENT SYNERGY DES 3.5X28) &p-mCx 100% (DES PCI STENT SYNERGY DES 2.25X32). pRCA 90%, mRCA 85% & rPDA 90%.dLAD ~40%.  EF 55 to 60%.  Moderately elevated LVEDP.  No R WMA.  Plan staged PCI to 3 RCA-PDA lesions 4/1   Depression    denies   Diabetes mellitus without complication (HCC)    Type II- Diet controlled   Frequency of urination    URINATION AT NIGHT, PAINFUL SLOW STREAM    GERD (gastroesophageal reflux disease)    occ   Hyperlipidemia    Neuromuscular disorder (HCC)    DIZZINESS   PERIPHERAL NEUROPATHY    Neuropathy    No pertinent past medical history    RINGING RIGHT EAR   NSTEMI (non-ST elevated myocardial infarction) (HCC) 11/13/2018   Recurrent upper respiratory infection (URI)    SOB CURRENT COLD   Shortness of breath    d/t "cold" 12/02/11   Past Surgical History:  Procedure Laterality Date   BACK SURGERY     BACK SURGERY  04/24/2022   Dr. Michale Age   CARDIAC CATHETERIZATION     CORONARY STENT INTERVENTION N/A 11/15/2018   Procedure: CORONARY STENT INTERVENTION;  Surgeon: Swaziland, Peter M, MD;  Location: [facility] INVASIVE CV LAB;;  Ost-pCx 95% (DES PCI  STENT SYNERGY DES 3.5X28) &p-mCx 100% (DES PCI STENT SYNERGY DES 2.25X32)   CORONARY STENT INTERVENTION N/A  11/16/2018   Procedure: CORONARY STENT INTERVENTION;  Surgeon: Arty Binning, MD;  Location: [facility] INVASIVE CV LAB;  Service: Cardiovascular;  Laterality: N/A;   FINGER SURGERY     RIGHT MIDDLE FINGER   HERNIA REPAIR     UMBILICAL HERNIA 2011   LEFT HEART CATH AND CORONARY ANGIOGRAPHY N/A 11/15/2018   Procedure: LEFT HEART CATH AND CORONARY ANGIOGRAPHY;  Surgeon: Swaziland, Peter M, MD;  Location: [facility] INVASIVE CV LAB;; Severe 2 vessel obstructive CAD:. Ost-pCx 95% (DES PCI) &p-mCx 100% (DES PCI). pRCA 90%, mRCA 85% & rPDA 90%.dLAD ~40%.  EF 55 to 60%.  Moderately elevated LVEDP.  No R WMA.  -- Plan staged PCI to 3 RCA-PDA lesions 4/1   LUMBAR LAMINECTOMY/DECOMPRESSION MICRODISCECTOMY  12/22/2011   Procedure: LUMBAR LAMINECTOMY/DECOMPRESSION MICRODISCECTOMY 1 LEVEL;  Surgeon: Manya Sells, MD;  Location: [facility] NEURO ORS;  Service: Neurosurgery;  Laterality: N/A;  Thoracic ten-eleven laminectomy   LUMBAR LAMINECTOMY/DECOMPRESSION MICRODISCECTOMY Right 05/01/2014   Procedure: Right Lumbar three-four Microdiskectomy;  Surgeon: Manya Sells, MD;  Location: [facility] NEURO ORS;  Service: Neurosurgery;  Laterality: Right;   LUMBAR LAMINECTOMY/DECOMPRESSION MICRODISCECTOMY Right 11/08/2014   Procedure: Right L4-5 L5-S1 Laminectomy;  Surgeon: Manya Sells, MD;  Location: [facility] NEURO ORS;  Service: Neurosurgery;  Laterality: Right;  Right L4-5 L5-S1 Laminectomy   MULTIPLE TOOTH EXTRACTIONS      Allergies  Allergies  Allergen Reactions   Egg-Derived Products Diarrhea and Other (See Comments)    Egg yolks cause stomach cramps and diarrhea- can tolerate if mixed in with other foods   Lactose Intolerance (Gi) Diarrhea and Other (See Comments)    Causes stomach cramps and diarrhea     Labs/Other Studies Reviewed    The following studies were reviewed today:  Cardiac Studies & Procedures   ______________________________________________________________________________________________ CARDIAC CATHETERIZATION  CARDIAC  CATHETERIZATION 11/16/2018  Conclusion  A stent was successfully placed.  A stent was successfully placed.   Successful RCA PCI resulting in proximal 90% stenosis, mid to distal 85% stenosis and PDA 90% stenosis being reduced to 0%, 0%, and 0% using Onyx drug-eluting stents.  TIMI grade III flow was noted.  The RCA proper was treated with 3.5 mm Onyx stents postdilated to 3.75 mm.  The PDA was treated with 2.0 Onyx stent deployed at 14 atm.  RECOMMENDATIONS:   Aspirin  and Brilinta  for at least 12 months.  Consider dropping aspirin  after 3 months if any issue with bleeding.  Aggressive risk factor modification.  Anticipate discharge in a.m.  Findings Coronary Findings Diagnostic  Dominance: Right  Right Coronary Artery Prox RCA lesion is 90% stenosed. Mid RCA lesion is 95% stenosed. Mid RCA to Dist RCA lesion is 80% stenosed.  Right Posterior Descending Artery Ost RPDA to RPDA lesion is 90% stenosed. RPDA lesion is 65% stenosed.  Intervention  Prox RCA lesion Stent A stent was successfully placed. Post-Intervention Lesion Assessment The intervention was successful. There is a 0% residual stenosis post intervention.  Mid RCA lesion Stent (Also treats lesions: Mid RCA to Dist RCA) A stent was successfully placed. Post-Intervention Lesion Assessment The intervention was successful. There is a 0% residual stenosis post intervention.  Mid RCA to Dist RCA lesion Stent (Also treats lesions: Mid RCA) See details in Mid RCA lesion. Post-Intervention Lesion Assessment The intervention was successful. There is a 0% residual stenosis post intervention.  Ost RPDA to RPDA lesion Stent A stent was successfully placed. Post-Intervention Lesion Assessment The intervention was successful. There is a 0% residual stenosis post intervention.   CARDIAC CATHETERIZATION 11/15/2018  Conclusion  Prox RCA lesion is 90% stenosed.  Mid RCA lesion is 85% stenosed.  RPDA lesion  is 90% stenosed.  Dist LAD lesion is 40% stenosed.  Ost Cx to Prox Cx lesion is 95% stenosed.  A drug-eluting stent was successfully placed using a STENT SYNERGY DES 3.5X28.  Prox Cx to Mid Cx lesion is 100% stenosed.  Post intervention, there is a 0% residual stenosis.  A drug-eluting stent was successfully placed using a STENT SYNERGY DES 2.25X32.  Post intervention, there is a 0% residual stenosis.  The left ventricular systolic function is normal.  LV end diastolic pressure is moderately elevated.  The left ventricular ejection fraction is 55-65% by visual estimate.  1. Severe 2 vessel obstructive CAD. - 95% proximal LCx. 100% distal LCx- this is the culprit lesion - 90% proximal RCA, 85% segmental mid RCA, 90% PDA 2. Normal LV function 3. Moderately elevated LVEDP 4. Successful PCI of the proximal and distal LCX with DES x 2.  Plan: DAPT for one year. Hydrate and observe renal function. Planned for stage PCI of the RCA and PDA in next 48 hours.  Findings Coronary Findings Diagnostic  Dominance: Right  Left Main Vessel was injected. Vessel is normal in caliber. Vessel is angiographically normal.  Left Anterior Descending Dist LAD lesion is 40% stenosed.  Left  Circumflex Collaterals Dist Cx filled by collaterals from RPAV.  Ost Cx to Prox Cx lesion is 95% stenosed. Prox Cx to Mid Cx lesion is 100% stenosed.  Right Coronary Artery Prox RCA lesion is 90% stenosed. Mid RCA lesion is 85% stenosed. The lesion is segmental.  Right Posterior Descending Artery RPDA lesion is 90% stenosed.  Intervention  Ost Cx to Prox Cx lesion Stent CATHETER LAUNCHER 6FREBU 3.5 guide catheter was inserted. Lesion crossed with guidewire using a WIRE PT2 MS 185. Pre-stent angioplasty was performed using a BALLOON SAPPHIRE 2.0X12. A drug-eluting stent was successfully placed using a STENT SYNERGY DES 3.5X28. Stent strut is well apposed. Post-stent angioplasty was performed using a  BALLOON [place] EMERGE MR 3.5X15. Maximum pressure:  16 atm. Post-Intervention Lesion Assessment The intervention was successful. Pre-interventional TIMI flow is 3. Post-intervention TIMI flow is 3. No complications occurred at this lesion. There is a 0% residual stenosis post intervention.  Prox Cx to Mid Cx lesion Stent CATHETER LAUNCHER 6FREBU 3.5 guide catheter was inserted. Lesion crossed with guidewire using a WIRE PT2 MS 185. Pre-stent angioplasty was performed using a BALLOON SAPPHIRE 2.0X12. A drug-eluting stent was successfully placed using a STENT SYNERGY DES 2.25X32. Stent strut is well apposed. Post-stent angioplasty was performed using a BALLOON [place] EMERGE MR 2.5X15. Maximum pressure:  16 atm. Post-Intervention Lesion Assessment The intervention was successful. Pre-interventional TIMI flow is 0. Post-intervention TIMI flow is 3. No complications occurred at this lesion. There is a 0% residual stenosis post intervention.              ______________________________________________________________________________________________     Recent Labs: 04/01/2023: ALT 80; BUN 18; Creatinine, Ser 1.40; Hemoglobin 13.9; Platelets 225; Potassium 4.2; Sodium 138  Recent Lipid Panel    Component Value Date/Time   CHOL 144 11/27/2022 0945   TRIG 175 (H) 11/27/2022 0945   HDL 42 11/27/2022 0945   CHOLHDL 3.4 11/27/2022 0945   CHOLHDL 4.8 11/15/2018 0240   VLDL 58 (H) 11/15/2018 0240   LDLCALC 72 11/27/2022 0945    History of Present Illness    73 year old male with the above past medical history including CAD s/p NSTEMI, DES x4 (pLCx, dLCx, RCA and PDA) in 2020, hyperlipidemia, type 2 diabetes, arthritis, BPH, and GERD.   He was hospitalized in March 2020 in the setting of NSTEMI.  Cardiac catheterization revealed high-grade stenosis of both the left circumflex and RCA.  He underwent staged PCI with a total of DES x4 (p Cx, d Cx, RCA and PDA).  His beta-blocker was switched from  metoprolol  tartrate to metoprolol  succinate 12.5 mg daily in the setting of hypotension, bradycardia.  He was last seen in the office on 11/27/2022 and was doing well from a cardiac standpoint.  He was cleared for lumbar myelogram/CT.   He presents today for follow-up accompanied by his wife and for preoperative cardiac evaluation for peripheral nerve stimulator perm SX with Dr. Katheryn Pandy of [facility] with request to hold Plavix  for 7 days prior to procedure. Since his last visit he has done well from a cardiac standpoint.  He does note some mild shortness of breath, fatigue with significant exertion, however, he attributes this to being less active overall.  He denies other symptoms concerning for angina. He denies palpitations, dizziness, edema, PND, orthopnea, weight gain.  Overall, he reports feeling well.   Home Medications    Current Outpatient Medications  Medication Sig Dispense Refill   albuterol  (VENTOLIN  HFA) 108 (90 Base)  MCG/ACT inhaler Inhale 2 puffs into the lungs every 6 (six) hours as needed for wheezing or shortness of breath.     Alpha-D-Galactosidase (BEANO) TABS Take 2 tablets by mouth at bedtime.     aspirin  EC 81 MG tablet Take 1 tablet (81 mg total) by mouth See admin instructions.     atorvastatin  (LIPITOR ) 80 MG tablet Take 1 tablet (80 mg total) by mouth at bedtime. 90 tablet 3   B Complex Vitamins (VITAMIN B COMPLEX) TABS Take 1 tablet by mouth daily.     blood glucose meter kit and supplies Dispense based on patient and insurance preference. Use up to four times daily as directed. (FOR ICD-10 E10.9, E11.9). 1 each 0   cholecalciferol  (VITAMIN D3) 25 MCG (1000 UT) tablet Take 1,000 Units by mouth daily.     clopidogrel  (PLAVIX ) 75 MG tablet TAKE 1 TABLET BY MOUTH EVERY DAY 90 tablet 3   Cyanocobalamin  2500 MCG TABS Take 2,500 mcg by mouth daily.     ezetimibe  (ZETIA ) 10 MG tablet TAKE 1 TABLET BY MOUTH EVERY DAY 15 tablet 0   ferrous sulfate  325  (65 FE) MG EC tablet Take 325 mg by mouth daily with breakfast.     finasteride  (PROSCAR ) 5 MG tablet Take 5 mg by mouth daily.     fluticasone  (FLONASE ) 50 MCG/ACT nasal spray Place 2 sprays into both nostrils daily as needed for allergies.  2   gabapentin  (NEURONTIN ) 400 MG capsule Take 1 capsule (400 mg total) by mouth 3 (three) times daily. 90 capsule 6   GARLIC PO Take 1 capsule by mouth daily.     imipramine  (TOFRANIL -PM) 150 MG capsule TAKE 1 CAPSULE BY MOUTH EVERYDAY AT BEDTIME 90 capsule 1   metoprolol  succinate (TOPROL -XL) 25 MG 24 hr tablet TAKE 1 TABLET (25 MG TOTAL) BY MOUTH DAILY. 90 tablet 3   nitroGLYCERIN  (NITROSTAT ) 0.4 MG SL tablet Place 1 tablet (0.4 mg total) under the tongue every 5 (five) minutes as needed for chest pain. 75 tablet 0   Omega-3 Fatty Acids (FISH OIL) 1000 MG CAPS Take 1,000 mg by mouth daily.     pantoprazole  (PROTONIX ) 40 MG tablet Take 40 mg by mouth daily before breakfast.     polyethylene glycol (MIRALAX  / GLYCOLAX ) 17 g packet Take 17 g by mouth 2 (two) times daily. (Patient taking differently: Take 17 g by mouth as needed for mild constipation or moderate constipation.) 14 each 0   senna-docusate (SENOKOT-S) 8.6-50 MG tablet Take 2 tablets by mouth at bedtime as needed (constipation).      tamsulosin  (FLOMAX ) 0.4 MG CAPS capsule Take 0.4 mg by mouth every 12 (twelve) hours.     zonisamide  (ZONEGRAN ) 100 MG capsule TAKE 3 CAPSULES BY MOUTH EVERY DAY 270 capsule 1   No current facility-administered medications for this visit.     Review of Systems    He denies chest pain, palpitations, dyspnea, pnd, orthopnea, n, v, dizziness, syncope, edema, weight gain, or early satiety. All other systems reviewed and are otherwise negative except as noted above.   Physical Exam    VS:  BP 104/62   Pulse 75   Ht 5\' 8"  (1.727 m)   Wt 209 lb 12.8 oz (95.2 kg)   SpO2 98%   BMI 31.90 kg/m   GEN: Well nourished, well developed, in no acute distress. HEENT:  normal. Neck: Supple, no JVD, carotid bruits, or masses. Cardiac: RRR, no murmurs, rubs, or gallops. No clubbing, cyanosis, edema.  Radials/DP/PT 2+ and equal bilaterally.  Respiratory:  Respirations regular and unlabored, clear to auscultation bilaterally. GI: Soft, nontender, nondistended, BS + x 4. MS: no deformity or atrophy. Skin: warm and dry, no rash. Neuro:  Strength and sensation are intact. Psych: Normal affect.  Accessory Clinical Findings    ECG personally reviewed by me today - EKG Interpretation Date/Time:  Thursday Dec 16 2023 09:07:33 EDT Ventricular Rate:  75 PR Interval:  184 QRS Duration:  100 QT Interval:  386 QTC Calculation: 431 R Axis:   60  Text Interpretation: Normal sinus rhythm with sinus arrhythmia Normal ECG When compared with ECG of 01-Apr-2023 19:59, PREVIOUS ECG IS PRESENT Confirmed by Marlana Silvan ([id number]) on 12/16/2023 9:25:22 AM  - no acute changes.   Lab Results  Component Value Date   WBC 7.8 04/01/2023   HGB 13.9 04/01/2023   HCT 41.0 04/01/2023   MCV 97.0 04/01/2023   PLT 225 04/01/2023   Lab Results  Component Value Date   CREATININE 1.40 (H) 04/01/2023   BUN 18 04/01/2023   NA 138 04/01/2023   K 4.2 04/01/2023   CL 104 04/01/2023   CO2 22 04/01/2023   Lab Results  Component Value Date   ALT 80 (H) 04/01/2023   AST 42 (H) 04/01/2023   ALKPHOS 89 04/01/2023   BILITOT 1.3 (H) 04/01/2023   Lab Results  Component Value Date   CHOL 144 11/27/2022   HDL 42 11/27/2022   LDLCALC 72 11/27/2022   TRIG 175 (H) 11/27/2022   CHOLHDL 3.4 11/27/2022    Lab Results  Component Value Date   HGBA1C 5.9 (H) 04/17/2022    Assessment & Plan    1. CAD: S/p DES x4 (p Cx, d Cx, RCA and PDA) in 2020.He does note some mild shortness of breath, fatigue with significant exertion, however, he attributes this to being less active overall.  He denies other symptoms concerning for angina. No indication for ischemic evaluation.  Continue aspirin ,  Plavix , metoprolol , Lipitor  and Zetia .   2. Hyperlipidemia/type 2 diabetes: LDL was 67 in 12/2021.  Will update fasting lipids, CMET, A1c today. Continue Lipitor , Zetia .   3. Bradycardia: Prior history, HR stable in office today. Continue metoprolol .   4. Preoperative cardiac exam: According to the Revised Cardiac Risk Index (RCRI), his Perioperative Risk of Major Cardiac Event is (%): 0.9. His Functional Capacity in METs is: 5.62 according to the Duke Activity Status Index (DASI). Therefore, based on ACC/AHA guidelines, patient would be at acceptable risk for the planned procedure without further cardiovascular testing.  BP borderline low, he is asymptomatic with this, similar to prior BP readings. Of note, pt reports he did have hypotension previously during anesthesia, AKI.  Per office protocol, he may hold Plavix  for 5 days prior to procedure.  However, clearance request mention 7-day Plavix  hold prior to surgery.  Therefore, I will reach out to Dr. Alvis Ba for final recommendations regarding antiplatelet therapy.  Regarding ASA therapy, we recommend continuation of ASA throughout the perioperative period. Please resume Plavix  as soon as possible postprocedure, at the discretion of the surgeon. I will route this recommendation to the requesting party via Epic fax function.  ADDENDUM 12/20/2023: Per Dr. Alvis Ba, "OK to hold clopidogrel  for 7 days for the procedure." I will route this recommendation to the requesting party via Epic fax function.  5. Disposition: Follow-up in 6 months, sooner if needed.       Jude Norton, NP 12/16/2023, 9:27 AM

## 2023-12-16 ENCOUNTER — Encounter: Payer: Self-pay | Admitting: Nurse Practitioner

## 2023-12-16 ENCOUNTER — Ambulatory Visit: Attending: Nurse Practitioner | Admitting: Nurse Practitioner

## 2023-12-16 VITALS — BP 104/62 | HR 75 | Ht 68.0 in | Wt 209.8 lb

## 2023-12-16 DIAGNOSIS — R001 Bradycardia, unspecified: Secondary | ICD-10-CM | POA: Insufficient documentation

## 2023-12-16 DIAGNOSIS — Z79899 Other long term (current) drug therapy: Secondary | ICD-10-CM | POA: Insufficient documentation

## 2023-12-16 DIAGNOSIS — E119 Type 2 diabetes mellitus without complications: Secondary | ICD-10-CM | POA: Diagnosis present

## 2023-12-16 DIAGNOSIS — Z01810 Encounter for preprocedural cardiovascular examination: Secondary | ICD-10-CM | POA: Diagnosis present

## 2023-12-16 DIAGNOSIS — I2510 Atherosclerotic heart disease of native coronary artery without angina pectoris: Secondary | ICD-10-CM | POA: Diagnosis present

## 2023-12-16 DIAGNOSIS — E785 Hyperlipidemia, unspecified: Secondary | ICD-10-CM | POA: Diagnosis present

## 2023-12-16 LAB — COMPREHENSIVE METABOLIC PANEL WITH GFR
ALT: 25 IU/L (ref 0–44)
AST: 18 IU/L (ref 0–40)
Albumin: 4.1 g/dL (ref 3.8–4.8)
Alkaline Phosphatase: 98 IU/L (ref 44–121)
BUN/Creatinine Ratio: 19 (ref 10–24)
BUN: 23 mg/dL (ref 8–27)
Bilirubin Total: 0.7 mg/dL (ref 0.0–1.2)
CO2: 22 mmol/L (ref 20–29)
Calcium: 9.2 mg/dL (ref 8.6–10.2)
Chloride: 105 mmol/L (ref 96–106)
Creatinine, Ser: 1.19 mg/dL (ref 0.76–1.27)
Globulin, Total: 2.4 g/dL (ref 1.5–4.5)
Glucose: 153 mg/dL — ABNORMAL HIGH (ref 70–99)
Potassium: 4.7 mmol/L (ref 3.5–5.2)
Sodium: 140 mmol/L (ref 134–144)
Total Protein: 6.5 g/dL (ref 6.0–8.5)
eGFR: 65 mL/min/{1.73_m2} (ref >59)

## 2023-12-16 LAB — HEMOGLOBIN A1C
Est. average glucose Bld gHb Est-mCnc: 166 mg/dL
Hgb A1c MFr Bld: 7.4 % — ABNORMAL HIGH (ref 4.8–5.6)

## 2023-12-16 LAB — LIPID PANEL
Chol/HDL Ratio: 7.3 ratio — ABNORMAL HIGH (ref 0.0–5.0)
Cholesterol, Total: 256 mg/dL — ABNORMAL HIGH (ref 100–199)
HDL: 35 mg/dL — ABNORMAL LOW (ref >39)
LDL Chol Calc (NIH): 162 mg/dL — ABNORMAL HIGH (ref 0–99)
Triglycerides: 310 mg/dL — ABNORMAL HIGH (ref 0–149)
VLDL Cholesterol Cal: 59 mg/dL — ABNORMAL HIGH (ref 5–40)

## 2023-12-16 NOTE — Patient Instructions (Signed)
 Medication Instructions:  Your physician recommends that you continue on your current medications as directed. Please refer to the Current Medication list given to you today.  *If you need a refill on your cardiac medications before your next appointment, please call your pharmacy*  Lab Work: Fasting lipid panel, CMET, A1C today  Testing/Procedures: NONE ordered at this time of appointment    Follow-Up: At [facility], you and your health needs are our priority.  As part of our continuing mission to provide you with exceptional heart care, our providers are all part of one team.  This team includes your primary Cardiologist (physician) and Advanced Practice Providers or APPs (Physician Assistants and Nurse Practitioners) who all work together to provide you with the care you need, when you need it.  Your next appointment:   6 month(s)  Provider:   Luana Rumple, MD or Marlana Silvan, NP          We recommend signing up for the patient portal called "MyChart".  Sign up information is provided on this After Visit Summary.  MyChart is used to connect with patients for Virtual Visits (Telemedicine).  Patients are able to view lab/test results, encounter notes, upcoming appointments, etc.  Non-urgent messages can be sent to your provider as well.   To learn more about what you can do with MyChart, go to [email or web address].

## 2023-12-18 ENCOUNTER — Encounter: Payer: Self-pay | Admitting: Nurse Practitioner

## 2023-12-19 NOTE — Telephone Encounter (Signed)
 OK to hold clopidogrel  for 7 days for the procedure.

## 2023-12-20 ENCOUNTER — Telehealth (HOSPITAL_BASED_OUTPATIENT_CLINIC_OR_DEPARTMENT_OTHER): Payer: Self-pay

## 2023-12-20 NOTE — Telephone Encounter (Signed)
   Primary Cardiologist: Luana Rumple, MD  Chart reviewed as part of pre-operative protocol coverage. Given past medical history and time since last visit, based on ACC/AHA guidelines, Arthur Smith would be at acceptable risk for the planned procedure without further cardiovascular testing.   Patient was advised that if he develops new symptoms prior to surgery to contact our office to arrange a follow-up appointment.  He verbalized understanding.  Per office protocol, he may hold Plavix  for 7 days prior to procedure and should resume as soon as hemodynamically stable postoperatively.  I will route this recommendation to the requesting party via Epic fax function and remove from pre-op pool.  Please call with questions.  Gerldine Koch, NP-C 12/20/2023, 7:38 AM [address], [address] Office [phone] Fax [phone]

## 2023-12-20 NOTE — Telephone Encounter (Signed)
 Attempted to reach pt. Voice message played and didn't allow a message to be saved. Will call pt back.

## 2023-12-21 ENCOUNTER — Other Ambulatory Visit: Payer: Self-pay | Admitting: Nurse Practitioner

## 2023-12-21 DIAGNOSIS — E785 Hyperlipidemia, unspecified: Secondary | ICD-10-CM

## 2023-12-23 ENCOUNTER — Telehealth: Payer: Self-pay

## 2023-12-23 DIAGNOSIS — E785 Hyperlipidemia, unspecified: Secondary | ICD-10-CM

## 2023-12-23 DIAGNOSIS — Z79899 Other long term (current) drug therapy: Secondary | ICD-10-CM

## 2023-12-24 NOTE — Telephone Encounter (Signed)
 Opened in error

## 2024-01-10 NOTE — Progress Notes (Deleted)
 PATIENT: Arthur Smith DOB: 09/29/1950  REASON FOR VISIT: follow up for peripheral neuropathy  HISTORY FROM: patient PRIMARY NEUROLOGIST: Dr. Tilda Fogo   HISTORY OF PRESENT ILLNESS: Today 01/10/24   10/01/21 SS: [name] is here today for follow-up with history of peripheral neuropathy, felt to be inherited.  He is on gabapentin , Lyrica , Tofranil , Zonegran  from this office. Chronic back pain, 2016 had back surgery with Dr.Stern. Neuropathy pain is well controlled as long as takes pills every 6 hours. When taking the medication late, fills pins and needles in the feet, burning. He can't sleep at night due to symptoms without the medications. No falls or gait instability. 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 No recent health issues. Neuropathy is stable. Has lost 50-60 lbs in 3 years.   Update 10/01/2020 SS: [name] is a 73 year old male with history of peripheral neuropathy, is felt to be inherited.  He has chronic low back pain, has had a right L4-5 laminectomy in 2016 with Dr. Nigel Bart.  He is on gabapentin  and Lyrica .  He is also prescribed Tofranil  and Zonegran  from this office, since at least 2014.  With all of these medications, reports his neuropathy is under good control.  He is on max dose gabapentin , has return of symptoms reportedly with dose reduction. With neuropathy he has intense pain and burning without medications. Has chronic numbness knees down. No falls. He tried to cut back on Zonegran  at bedtime, he felt very uncomfortable at bedtime, even after 1 night. Tried to cut out the gabapentin  100 mg, he felt more pain. He wants to stay on current medication doses, he is really comfortable where he is at. He walks for exercise, does some home weights. Lost 30 lbs last year. Here today follow-up unaccompanied.  HISTORY  09/28/2019 SS: [name] is a 73 year old male with history of peripheral neuropathy. Is felt to be inherited.  He has chronic  low back pain, has had a right L4-L5 laminectomy in 2016 with Dr. Nigel Bart.  He is on gabapentin  and Lyrica .  Apparently, he is also prescribed Tofranil  and Zonegran  from this office.  He has been on long-term doses of these medications, since at least 2014.  With all the medications, he says the neuropathy in his legs is under excellent control.  In the past, he says he has tried to decrease his dose of gabapentin , and he was miserable.  He has numbness intermittently from the knees down.  He has not had any falls.  He uses a cane for ambulation.  He lives with his wife, drives a car without difficulty.  He indicates he is in good health.  He indicates he is tolerating medications without side effect.  They help him sleep at night.  He presents today for evaluation unaccompanied.  REVIEW OF SYSTEMS: Out of a complete 14 system review of symptoms, the patient complains only of the following symptoms, and all other reviewed systems are negative.  See HPI  ALLERGIES: Allergies  Allergen Reactions   Egg-Derived Products Diarrhea and Other (See Comments)    Egg yolks cause stomach cramps and diarrhea- can tolerate if mixed in with other foods   Lactose Intolerance (Gi) Diarrhea and Other (See Comments)    Causes stomach cramps and diarrhea    HOME MEDICATIONS: Outpatient Medications Prior to Visit  Medication Sig Dispense Refill   albuterol  (VENTOLIN  HFA) 108 (90 Base) MCG/ACT inhaler Inhale 2 puffs into the  lungs every 6 (six) hours as needed for wheezing or shortness of breath.     Alpha-D-Galactosidase (BEANO) TABS Take 2 tablets by mouth at bedtime.     aspirin  EC 81 MG tablet Take 1 tablet (81 mg total) by mouth See admin instructions.     atorvastatin  (LIPITOR ) 80 MG tablet Take 1 tablet (80 mg total) by mouth at bedtime. 90 tablet 3   B Complex Vitamins (VITAMIN B COMPLEX) TABS Take 1 tablet by mouth daily.     blood glucose meter kit and supplies Dispense based on patient and insurance  preference. Use up to four times daily as directed. (FOR ICD-10 E10.9, E11.9). 1 each 0   cholecalciferol  (VITAMIN D3) 25 MCG (1000 UT) tablet Take 1,000 Units by mouth daily.     clopidogrel  (PLAVIX ) 75 MG tablet TAKE 1 TABLET BY MOUTH EVERY DAY 90 tablet 3   Cyanocobalamin  2500 MCG TABS Take 2,500 mcg by mouth daily.     ezetimibe  (ZETIA ) 10 MG tablet TAKE 1 TABLET BY MOUTH EVERY DAY 90 tablet 3   ferrous sulfate  325 (65 FE) MG EC tablet Take 325 mg by mouth daily with breakfast.     finasteride  (PROSCAR ) 5 MG tablet Take 5 mg by mouth daily.     fluticasone  (FLONASE ) 50 MCG/ACT nasal spray Place 2 sprays into both nostrils daily as needed for allergies.  2   gabapentin  (NEURONTIN ) 400 MG capsule Take 1 capsule (400 mg total) by mouth 3 (three) times daily. 90 capsule 6   GARLIC PO Take 1 capsule by mouth daily.     imipramine  (TOFRANIL -PM) 150 MG capsule TAKE 1 CAPSULE BY MOUTH EVERYDAY AT BEDTIME 90 capsule 1   metoprolol  succinate (TOPROL -XL) 25 MG 24 hr tablet TAKE 1 TABLET (25 MG TOTAL) BY MOUTH DAILY. 90 tablet 3   nitroGLYCERIN  (NITROSTAT ) 0.4 MG SL tablet Place 1 tablet (0.4 mg total) under the tongue every 5 (five) minutes as needed for chest pain. 75 tablet 0   Omega-3 Fatty Acids (FISH OIL) 1000 MG CAPS Take 1,000 mg by mouth daily.     pantoprazole  (PROTONIX ) 40 MG tablet Take 40 mg by mouth daily before breakfast.     polyethylene glycol (MIRALAX  / GLYCOLAX ) 17 g packet Take 17 g by mouth 2 (two) times daily. (Patient taking differently: Take 17 g by mouth as needed for mild constipation or moderate constipation.) 14 each 0   senna-docusate (SENOKOT-S) 8.6-50 MG tablet Take 2 tablets by mouth at bedtime as needed (constipation).      tamsulosin  (FLOMAX ) 0.4 MG CAPS capsule Take 0.4 mg by mouth every 12 (twelve) hours.     zonisamide  (ZONEGRAN ) 100 MG capsule TAKE 3 CAPSULES BY MOUTH EVERY DAY 270 capsule 1   No facility-administered medications prior to visit.    PAST MEDICAL  HISTORY: Past Medical History:  Diagnosis Date   Arthritis    BPH without obstruction/lower urinary tract symptoms    Bronchitis with asthma, acute 11/2011   CAD S/P percutaneous coronary angioplasty 11/15/2018   Severe 2 vessel obstructive CAD:. Ost-pCx 95% (DES PCI  STENT SYNERGY DES 3.5X28) &p-mCx 100% (DES PCI STENT SYNERGY DES 2.25X32). pRCA 90%, mRCA 85% & rPDA 90%.dLAD ~40%.  EF 55 to 60%.  Moderately elevated LVEDP.  No R WMA.  Plan staged PCI to 3 RCA-PDA lesions 4/1   Depression    denies   Diabetes mellitus without complication (HCC)    Type II- Diet controlled   Frequency of urination  URINATION AT NIGHT, PAINFUL SLOW STREAM    GERD (gastroesophageal reflux disease)    occ   Hyperlipidemia    Neuromuscular disorder (HCC)    DIZZINESS   PERIPHERAL NEUROPATHY    Neuropathy    No pertinent past medical history    RINGING RIGHT EAR   NSTEMI (non-ST elevated myocardial infarction) (HCC) 11/13/2018   Recurrent upper respiratory infection (URI)    SOB CURRENT COLD   Shortness of breath    d/t "cold" 12/02/11    PAST SURGICAL HISTORY: Past Surgical History:  Procedure Laterality Date   BACK SURGERY     BACK SURGERY  04/24/2022   Dr. Michale Age   CARDIAC CATHETERIZATION     CORONARY STENT INTERVENTION N/A 11/15/2018   Procedure: CORONARY STENT INTERVENTION;  Surgeon: Swaziland, Peter M, MD;  Location: [facility] INVASIVE CV LAB;;  Ost-pCx 95% (DES PCI  STENT SYNERGY DES 3.5X28) &p-mCx 100% (DES PCI STENT SYNERGY DES 2.25X32)   CORONARY STENT INTERVENTION N/A 11/16/2018   Procedure: CORONARY STENT INTERVENTION;  Surgeon: Arty Binning, MD;  Location: [facility] INVASIVE CV LAB;  Service: Cardiovascular;  Laterality: N/A;   FINGER SURGERY     RIGHT MIDDLE FINGER   HERNIA REPAIR     UMBILICAL HERNIA 2011   LEFT HEART CATH AND CORONARY ANGIOGRAPHY N/A 11/15/2018   Procedure: LEFT HEART CATH AND CORONARY ANGIOGRAPHY;  Surgeon: Swaziland, Peter M, MD;  Location: [facility] INVASIVE CV LAB;; Severe 2  vessel obstructive CAD:. Ost-pCx 95% (DES PCI) &p-mCx 100% (DES PCI). pRCA 90%, mRCA 85% & rPDA 90%.dLAD ~40%.  EF 55 to 60%.  Moderately elevated LVEDP.  No R WMA.  -- Plan staged PCI to 3 RCA-PDA lesions 4/1   LUMBAR LAMINECTOMY/DECOMPRESSION MICRODISCECTOMY  12/22/2011   Procedure: LUMBAR LAMINECTOMY/DECOMPRESSION MICRODISCECTOMY 1 LEVEL;  Surgeon: Manya Sells, MD;  Location: [facility] NEURO ORS;  Service: Neurosurgery;  Laterality: N/A;  Thoracic ten-eleven laminectomy   LUMBAR LAMINECTOMY/DECOMPRESSION MICRODISCECTOMY Right 05/01/2014   Procedure: Right Lumbar three-four Microdiskectomy;  Surgeon: Manya Sells, MD;  Location: [facility] NEURO ORS;  Service: Neurosurgery;  Laterality: Right;   LUMBAR LAMINECTOMY/DECOMPRESSION MICRODISCECTOMY Right 11/08/2014   Procedure: Right L4-5 L5-S1 Laminectomy;  Surgeon: Manya Sells, MD;  Location: [facility] NEURO ORS;  Service: Neurosurgery;  Laterality: Right;  Right L4-5 L5-S1 Laminectomy   MULTIPLE TOOTH EXTRACTIONS      FAMILY HISTORY: Family History  Problem Relation Age of Onset   Cancer Mother    Stroke Father    Neuropathy Brother     SOCIAL HISTORY: Social History   Socioeconomic History   Marital status: Married    Spouse name: [name]   Number of children: 3   Years of education: 12   Highest education level: Not on file  Occupational History   Occupation: Retired  Tobacco Use   Smoking status: Former    Current packs/day: 0.00    Average packs/day: 2.0 packs/day for 15.0 years (30.0 ttl pk-yrs)    Types: Cigarettes    Start date: 04/24/1969    Quit date: 04/24/1984    Years since quitting: 39.7   Smokeless tobacco: Never  Vaping Use   Vaping status: Never Used  Substance and Sexual Activity   Alcohol use: Yes    Comment: occ   Drug use: No   Sexual activity: Not on file  Other Topics Concern   Not on file  Social History Narrative   Patient lives at home [name] his wife.    Patient has 3 adult children and 1  step son.    Patient has a  high school education.    Patient is retired.    [employer]    Social Drivers of Corporate investment banker Strain: Not on file  Food Insecurity: Not on file  Transportation Needs: Not on file  Physical Activity: Not on file  Stress: Not on file  Social Connections: Not on file  Intimate Partner Violence: Not on file   PHYSICAL EXAM  There were no vitals filed for this visit.   There is no height or weight on file to calculate BMI.  Generalized: Well developed, in no acute distress  Neurological examination  Mentation: Alert oriented to time, place, history taking. Follows all commands speech and language fluent Cranial nerve II-XII: Pupils were equal round reactive to light. Extraocular movements were full, visual field were full on confrontational test. Facial sensation and strength were normal. Head turning and shoulder shrug  were normal and symmetric. Motor: The motor testing reveals 5 over 5 strength of all 4 extremities. Good symmetric motor tone is noted throughout.  Sensory: Stocking pattern sensory deficit to soft touch lower extremities Coordination: Cerebellar testing reveals good finger-nose-finger and heel-to-shin bilaterally.  Gait and station: Gait is normal Reflexes: Deep tendon reflexes are symmetric but decreased throughout  DIAGNOSTIC DATA (LABS, IMAGING, TESTING) - I reviewed patient records, labs, notes, testing and imaging myself where available.  Lab Results  Component Value Date   WBC 7.8 04/01/2023   HGB 13.9 04/01/2023   HCT 41.0 04/01/2023   MCV 97.0 04/01/2023   PLT 225 04/01/2023      Component Value Date/Time   NA 140 12/16/2023 1017   K 4.7 12/16/2023 1017   CL 105 12/16/2023 1017   CO2 22 12/16/2023 1017   GLUCOSE 153 (H) 12/16/2023 1017   GLUCOSE 147 (H) 04/01/2023 2032   BUN 23 12/16/2023 1017   CREATININE 1.19 12/16/2023 1017   CALCIUM  9.2 12/16/2023 1017   PROT 6.5 12/16/2023 1017   ALBUMIN  4.1 12/16/2023 1017   AST 18  12/16/2023 1017   ALT 25 12/16/2023 1017   ALKPHOS 98 12/16/2023 1017   BILITOT 0.7 12/16/2023 1017   GFRNONAA 58 (L) 04/01/2023 2019   GFRAA >60 12/03/2019 0417   Lab Results  Component Value Date   CHOL 256 (H) 12/16/2023   HDL 35 (L) 12/16/2023   LDLCALC 162 (H) 12/16/2023   TRIG 310 (H) 12/16/2023   CHOLHDL 7.3 (H) 12/16/2023   Lab Results  Component Value Date   HGBA1C 7.4 (H) 12/16/2023   No results found for: "VITAMINB12" No results found for: "TSH"  ASSESSMENT AND PLAN 73 y.o. year old male  has a past medical history of Arthritis, BPH without obstruction/lower urinary tract symptoms, Bronchitis with asthma, acute (11/2011), CAD S/P percutaneous coronary angioplasty (11/15/2018), Depression, Diabetes mellitus without complication (HCC), Frequency of urination, GERD (gastroesophageal reflux disease), Hyperlipidemia, Neuromuscular disorder (HCC), Neuropathy, No pertinent past medical history, NSTEMI (non-ST elevated myocardial infarction) (HCC) (11/13/2018), Recurrent upper respiratory infection (URI), and Shortness of breath. here with:  1.  Peripheral neuropathy 2.  Chronic low back pain  -Symptoms have remained overall stable on medications, in the past has tried to reduce the dose of his medicines but resulted in reported significant discomfort -Will continue current medications for now, will refer to pain clinic for ongoing management and medication administration -Return here on an as-needed basis  Continue gabapentin  800 mg, 4 times a day Continue gabapentin  100 mg capsule, 4 times  a day, with 800 mg Continue Lyrica  100 mg twice a day  Continue Tofranil  150 mg at bedtime Continue Zonegran  100 mg capsule, 3 capsules at bedtime  Jeanmarie Millet, AGNP-C, DNP 01/10/2024, 9:41 PM [facility] [address] [place], [place] [address] [phone]

## 2024-01-11 ENCOUNTER — Ambulatory Visit: Admitting: Neurology

## 2024-01-11 ENCOUNTER — Telehealth: Payer: Self-pay | Admitting: Neurology

## 2024-01-11 NOTE — Telephone Encounter (Signed)
 Pt called to reschedule appt the patient feeling sick

## 2024-01-13 ENCOUNTER — Other Ambulatory Visit: Payer: Self-pay | Admitting: Physical Medicine & Rehabilitation

## 2024-01-14 ENCOUNTER — Other Ambulatory Visit: Payer: Self-pay | Admitting: Physical Medicine & Rehabilitation

## 2024-01-16 ENCOUNTER — Other Ambulatory Visit: Payer: Self-pay | Admitting: Physical Medicine & Rehabilitation

## 2024-01-17 ENCOUNTER — Other Ambulatory Visit: Payer: Self-pay

## 2024-01-17 MED ORDER — METOPROLOL SUCCINATE ER 25 MG PO TB24: 25.0000 mg | ORAL_TABLET | Freq: Every day | ORAL | 3 refills | Status: AC

## 2024-02-15 ENCOUNTER — Other Ambulatory Visit: Payer: Self-pay | Admitting: Physical Medicine & Rehabilitation

## 2024-02-22 ENCOUNTER — Other Ambulatory Visit: Payer: Self-pay | Admitting: Cardiovascular Disease

## 2024-04-05 ENCOUNTER — Other Ambulatory Visit: Payer: Self-pay | Admitting: Physical Medicine & Rehabilitation

## 2024-04-07 ENCOUNTER — Emergency Department (HOSPITAL_COMMUNITY)

## 2024-04-07 ENCOUNTER — Other Ambulatory Visit: Payer: Self-pay

## 2024-04-07 ENCOUNTER — Emergency Department (HOSPITAL_COMMUNITY)
Admission: EM | Admit: 2024-04-07 | Discharge: 2024-04-07 | Disposition: A | Discharge status: Home / Self Care | Attending: Emergency Medicine | Admitting: Emergency Medicine

## 2024-04-07 DIAGNOSIS — Z955 Presence of coronary angioplasty implant and graft: Secondary | ICD-10-CM | POA: Diagnosis not present

## 2024-04-07 DIAGNOSIS — M545 Low back pain, unspecified: Secondary | ICD-10-CM | POA: Insufficient documentation

## 2024-04-07 DIAGNOSIS — R0789 Other chest pain: Secondary | ICD-10-CM | POA: Insufficient documentation

## 2024-04-07 DIAGNOSIS — Z79899 Other long term (current) drug therapy: Secondary | ICD-10-CM | POA: Diagnosis not present

## 2024-04-07 DIAGNOSIS — Z7902 Long term (current) use of antithrombotics/antiplatelets: Secondary | ICD-10-CM | POA: Insufficient documentation

## 2024-04-07 DIAGNOSIS — Z7982 Long term (current) use of aspirin: Secondary | ICD-10-CM | POA: Insufficient documentation

## 2024-04-07 DIAGNOSIS — Z7901 Long term (current) use of anticoagulants: Secondary | ICD-10-CM | POA: Diagnosis not present

## 2024-04-07 DIAGNOSIS — E119 Type 2 diabetes mellitus without complications: Secondary | ICD-10-CM | POA: Insufficient documentation

## 2024-04-07 DIAGNOSIS — I2510 Atherosclerotic heart disease of native coronary artery without angina pectoris: Secondary | ICD-10-CM | POA: Insufficient documentation

## 2024-04-07 DIAGNOSIS — I10 Essential (primary) hypertension: Secondary | ICD-10-CM | POA: Insufficient documentation

## 2024-04-07 DIAGNOSIS — Y92410 Unspecified street and highway as the place of occurrence of the external cause: Secondary | ICD-10-CM | POA: Insufficient documentation

## 2024-04-07 DIAGNOSIS — S3993XA Unspecified injury of pelvis, initial encounter: Secondary | ICD-10-CM | POA: Diagnosis present

## 2024-04-07 LAB — CBC WITH DIFFERENTIAL/PLATELET
Abs Immature Granulocytes: 0.04 K/uL (ref 0.00–0.07)
Basophils Absolute: 0 K/uL (ref 0.0–0.1)
Basophils Relative: 0 %
Eosinophils Absolute: 0.1 K/uL (ref 0.0–0.5)
Eosinophils Relative: 2 %
HCT: 44.9 % (ref 39.0–52.0)
Hemoglobin: 14.9 g/dL (ref 13.0–17.0)
Immature Granulocytes: 1 %
Lymphocytes Relative: 24 %
Lymphs Abs: 1.7 K/uL (ref 0.7–4.0)
MCH: 32 pg (ref 26.0–34.0)
MCHC: 33.2 g/dL (ref 30.0–36.0)
MCV: 96.4 fL (ref 80.0–100.0)
Monocytes Absolute: 0.7 K/uL (ref 0.1–1.0)
Monocytes Relative: 11 %
Neutro Abs: 4.2 K/uL (ref 1.7–7.7)
Neutrophils Relative %: 62 %
Platelets: 229 K/uL (ref 150–400)
RBC: 4.66 MIL/uL (ref 4.22–5.81)
RDW: 13.1 % (ref 11.5–15.5)
WBC: 6.8 K/uL (ref 4.0–10.5)
nRBC: 0 % (ref 0.0–0.2)

## 2024-04-07 LAB — COMPREHENSIVE METABOLIC PANEL WITH GFR
ALT: 44 U/L (ref 0–44)
AST: 31 U/L (ref 15–41)
Albumin: 3.6 g/dL (ref 3.5–5.0)
Alkaline Phosphatase: 82 U/L (ref 38–126)
Anion gap: 10 (ref 5–15)
BUN: 19 mg/dL (ref 8–23)
CO2: 19 mmol/L — ABNORMAL LOW (ref 22–32)
Calcium: 8.8 mg/dL — ABNORMAL LOW (ref 8.9–10.3)
Chloride: 107 mmol/L (ref 98–111)
Creatinine, Ser: 1.19 mg/dL (ref 0.61–1.24)
GFR, Estimated: >60 mL/min (ref >60)
Glucose, Bld: 120 mg/dL — ABNORMAL HIGH (ref 70–99)
Potassium: 4.1 mmol/L (ref 3.5–5.1)
Sodium: 136 mmol/L (ref 135–145)
Total Bilirubin: 1.5 mg/dL — ABNORMAL HIGH (ref 0.0–1.2)
Total Protein: 6.5 g/dL (ref 6.5–8.1)

## 2024-04-07 LAB — PROTIME-INR
INR: 1.1 (ref 0.8–1.2)
Prothrombin Time: 14.6 s (ref 11.4–15.2)

## 2024-04-07 LAB — APTT: aPTT: 29 s (ref 24–36)

## 2024-04-07 MED ORDER — OXYCODONE HCL 5 MG PO TABS
5.0000 mg | ORAL_TABLET | Freq: Once | ORAL | Status: AC
  Administered 2024-04-07: 5 mg via ORAL
  Filled 2024-04-07: qty 1

## 2024-04-07 MED ORDER — IOHEXOL 350 MG/ML SOLN
75.0000 mL | Freq: Once | INTRAVENOUS | Status: AC | PRN
  Administered 2024-04-07: 75 mL via INTRAVENOUS

## 2024-04-07 MED ORDER — MORPHINE SULFATE (PF) 4 MG/ML IV SOLN
4.0000 mg | Freq: Once | INTRAVENOUS | Status: AC
  Administered 2024-04-07: 4 mg via INTRAVENOUS
  Filled 2024-04-07: qty 1

## 2024-04-07 MED ORDER — OXYCODONE HCL 5 MG PO TABS: 5.0000 mg | ORAL_TABLET | Freq: Three times a day (TID) | ORAL | 0 refills | Status: AC | PRN

## 2024-04-07 MED ORDER — ACETAMINOPHEN 325 MG PO TABS
650.0000 mg | ORAL_TABLET | Freq: Once | ORAL | Status: AC
  Administered 2024-04-07: 650 mg via ORAL
  Filled 2024-04-07: qty 2

## 2024-04-07 NOTE — Discharge Instructions (Signed)
 Thank you for allowing us  to care for you today.  Your CT images showed no evidence of acute injury.  As you are having chest wall pain and low back pain I am sending you home with pain medication.  Please be sure to take this every 8 hours as needed for moderate to severe pain.  Please be sure to continue using your incentive spirometer as long physical therapy to ensure you do not develop pneumonia.  Please be sure to follow-up with your primary care provider.  Please return to emergency department if you experience worsening pain, numbness or tingling of your extremities, develop chest pain or shortness of breath, passout or believe you need emergent medical care

## 2024-04-07 NOTE — Progress Notes (Signed)
   04/07/24 2251  Incentive Spirometry Tx  Level of Service Assisted by RCP  Frequency q1hr W/A (pt instructed to perform independently)  Treatment Tolerance Tolerated well  IS Goal (mL) (RN or RT) 1750 mL  IS - Achieved (mL) (RN, NT, or RT) 2250 mL   Pt surpassed goal RT instructed, and pt performed w/o any complications and with great effort.
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
## 2024-04-15 ENCOUNTER — Other Ambulatory Visit: Payer: Self-pay | Admitting: Neurology

## 2024-04-18 NOTE — Telephone Encounter (Signed)
 Last seen on 10/01/21 Followup scheduled on 08/08/24

## 2024-04-24 ENCOUNTER — Telehealth: Payer: Self-pay | Admitting: Neurology

## 2024-04-24 ENCOUNTER — Other Ambulatory Visit: Payer: Self-pay | Admitting: Physical Medicine & Rehabilitation

## 2024-04-24 NOTE — Telephone Encounter (Signed)
 Patient request refill for gabapentin  (NEURONTIN ) 400 MG capsule send to  [facility] [phone]

## 2024-04-24 NOTE — Telephone Encounter (Signed)
Pt must schedule appt for refill.

## 2024-04-26 ENCOUNTER — Telehealth: Payer: Self-pay

## 2024-04-26 MED ORDER — GABAPENTIN 400 MG PO CAPS: 400.0000 mg | ORAL_CAPSULE | Freq: Three times a day (TID) | ORAL | 0 refills | Status: DC

## 2024-04-26 NOTE — Telephone Encounter (Signed)
 [facility] is requesting a refill on medication Cephalexin 250 mg capsule that was D/C off of pt's medication list by our office. Would Dr. Francyne like to refill this non cardiac medication? Please address

## 2024-04-26 NOTE — Telephone Encounter (Signed)
 refilled

## 2024-04-26 NOTE — Telephone Encounter (Signed)
 Pt  called to follow up on Medication refill    gabapentin  (NEURONTIN ) 400 MG capsule  Pt would like medication sent to  [id number] - [place], [place] - [address]

## 2024-04-26 NOTE — Telephone Encounter (Signed)
 No refill for cephalexin

## 2024-05-04 ENCOUNTER — Telehealth: Payer: Self-pay | Admitting: Neurology

## 2024-05-04 MED ORDER — GABAPENTIN 400 MG PO CAPS: 400.0000 mg | ORAL_CAPSULE | Freq: Three times a day (TID) | ORAL | 2 refills | Status: DC

## 2024-05-04 NOTE — Telephone Encounter (Signed)
 Refilled

## 2024-05-04 NOTE — Telephone Encounter (Signed)
 Patient said need another refill for gabapentin  (NEURONTIN ) 400 MG capsule. Taking 1 capsule every 6 hours. The prescription only lasted 8 days. Would like to refill sent to  [facility] [phone]

## 2024-05-31 ENCOUNTER — Other Ambulatory Visit: Payer: Self-pay | Admitting: Cardiovascular Disease

## 2024-07-16 ENCOUNTER — Other Ambulatory Visit: Payer: Self-pay | Admitting: Cardiovascular Disease

## 2024-08-03 ENCOUNTER — Telehealth: Payer: Self-pay | Admitting: Neurology

## 2024-08-03 NOTE — Telephone Encounter (Signed)
 Appointment details confirmed

## 2024-08-08 ENCOUNTER — Encounter: Payer: Self-pay | Admitting: Neurology

## 2024-08-08 ENCOUNTER — Ambulatory Visit (INDEPENDENT_AMBULATORY_CARE_PROVIDER_SITE_OTHER): Admitting: Neurology

## 2024-08-08 VITALS — BP 101/66 | Ht 68.0 in | Wt 200.0 lb

## 2024-08-08 DIAGNOSIS — G609 Hereditary and idiopathic neuropathy, unspecified: Secondary | ICD-10-CM

## 2024-08-08 MED ORDER — GABAPENTIN 400 MG PO CAPS: 400.0000 mg | ORAL_CAPSULE | Freq: Three times a day (TID) | ORAL | 5 refills | Status: AC

## 2024-08-08 MED ORDER — IMIPRAMINE PAMOATE 150 MG PO CAPS: 150.0000 mg | ORAL_CAPSULE | Freq: Every day | ORAL | 1 refills | Status: AC

## 2024-08-08 MED ORDER — ZONISAMIDE 100 MG PO CAPS: 300.0000 mg | ORAL_CAPSULE | Freq: Every day | ORAL | 5 refills | Status: AC

## 2024-08-08 NOTE — Progress Notes (Signed)
 "  PATIENT: Arthur Smith DOB: 1950/11/26  REASON FOR VISIT: follow up for peripheral neuropathy  HISTORY FROM: patient PRIMARY NEUROLOGIST: [name] /[name]   HISTORY OF PRESENT ILLNESS: Today 08/08/2024 08/08/24 SS: Arthur Smith to PMR for management of medications for neuropathy, Qutenza . Also sees [facility] for spinal stimulator. Mentions at some point since last seen, was on too much medication and had side effect. Now taking  lower dose of gabapentin , 400 mg TID, Tofranil  150 mg at bedtime, Zonegran  300 mg at bedtime. Having RLS symptoms, not sleeping well. Right now neuropathy is doing well. Walking and balance is fine. Continues with burning sensation, pins and needles in his feet. Takes Iron.   10/01/21 SS: Dravon is here today for follow-up with history of peripheral neuropathy, felt to be inherited.  He is on gabapentin , Lyrica , Tofranil , Zonegran  from this office. Chronic back pain, 2016 had back surgery with [facility]. Neuropathy pain is well controlled as long as takes pills every 6 hours. When taking the medication late, fills pins and needles in the feet, burning. He can't sleep at night due to symptoms without the medications. No falls or gait instability. 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 No recent health issues. Neuropathy is stable. Has lost 50-60 lbs in 3 years.   Update 10/01/2020 SS: Arthur Smith is a 73 year old male with history of peripheral neuropathy, is felt to be inherited.  He has chronic low back pain, has had a right L4-5 laminectomy in 2016 with Arthur Smith.  He is on gabapentin  and Lyrica .  He is also prescribed Tofranil  and Zonegran  from this office, since at least 2014.  With all of these medications, reports his neuropathy is under good control.  He is on max dose gabapentin , has return of symptoms reportedly with dose reduction. With neuropathy he has intense pain and burning without medications. Has chronic numbness  knees down. No falls. He tried to cut back on Zonegran  at bedtime, he felt very uncomfortable at bedtime, even after 1 night. Tried to cut out the gabapentin  100 mg, he felt more pain. He wants to stay on current medication doses, he is really comfortable where he is at. He walks for exercise, does some home weights. Lost 30 lbs last year. Here today follow-up unaccompanied.  HISTORY  09/28/2019 SS: Arthur Smith is a 73 year old male with history of peripheral neuropathy. Is felt to be inherited.  He has chronic low back pain, has had a right L4-L5 laminectomy in 2016 with Arthur Smith.  He is on gabapentin  and Lyrica .  Apparently, he is also prescribed Tofranil  and Zonegran  from this office.  He has been on long-term doses of these medications, since at least 2014.  With all the medications, he says the neuropathy in his legs is under excellent control.  In the past, he says he has tried to decrease his dose of gabapentin , and he was miserable.  He has numbness intermittently from the knees down.  He has not had any falls.  He uses a cane for ambulation.  He lives with his wife, drives a car without difficulty.  He indicates he is in good health.  He indicates he is tolerating medications without side effect.  They help him sleep at night.  He presents today for evaluation unaccompanied.  REVIEW OF SYSTEMS: Out of a complete 14 system review of symptoms, the patient complains only of the following symptoms, and all other reviewed systems  are negative.  See HPI  ALLERGIES: Allergies  Allergen Reactions   Egg Protein-Containing Drug Products Diarrhea and Other (See Comments)    Egg yolks cause stomach cramps and diarrhea- can tolerate if mixed in with other foods   Lactose Intolerance (Gi) Diarrhea and Other (See Comments)    Causes stomach cramps and diarrhea    HOME MEDICATIONS: Outpatient Medications Prior to Visit  Medication Sig Dispense Refill   albuterol  (VENTOLIN  HFA) 108 (90 Base) MCG/ACT  inhaler Inhale 2 puffs into the lungs every 6 (six) hours as needed for wheezing or shortness of breath.     Alpha-D-Galactosidase (BEANO) TABS Take 2 tablets by mouth at bedtime.     aspirin  EC 81 MG tablet Take 1 tablet (81 mg total) by mouth See admin instructions.     atorvastatin  (LIPITOR ) 80 MG tablet Take 1 tablet (80 mg total) by mouth at bedtime. 90 tablet 3   B Complex Vitamins (VITAMIN B COMPLEX) TABS Take 1 tablet by mouth daily.     blood glucose meter kit and supplies Dispense based on patient and insurance preference. Use up to four times daily as directed. (FOR ICD-10 E10.9, E11.9). 1 each 0   cholecalciferol  (VITAMIN D3) 25 MCG (1000 UT) tablet Take 1,000 Units by mouth daily.     clopidogrel  (PLAVIX ) 75 MG tablet TAKE 1 TABLET BY MOUTH EVERY DAY 90 tablet 1   Cyanocobalamin  2500 MCG TABS Take 2,500 mcg by mouth daily.     ezetimibe  (ZETIA ) 10 MG tablet TAKE 1 TABLET BY MOUTH EVERY DAY 90 tablet 3   ferrous sulfate  325 (65 FE) MG EC tablet Take 325 mg by mouth daily with breakfast.     finasteride  (PROSCAR ) 5 MG tablet Take 5 mg by mouth daily.     fluticasone  (FLONASE ) 50 MCG/ACT nasal spray Place 2 sprays into both nostrils daily as needed for allergies.  2   GARLIC PO Take 1 capsule by mouth daily.     JARDIANCE 10 MG TABS tablet Take 10 mg by mouth daily.     metoprolol  succinate (TOPROL -XL) 25 MG 24 hr tablet Take 1 tablet (25 mg total) by mouth daily. 90 tablet 3   Omega-3 Fatty Acids (FISH OIL) 1000 MG CAPS Take 1,000 mg by mouth daily.     pantoprazole  (PROTONIX ) 40 MG tablet Take 40 mg by mouth daily before breakfast.     polyethylene glycol (MIRALAX  / GLYCOLAX ) 17 g packet Take 17 g by mouth 2 (two) times daily. (Patient taking differently: Take 17 g by mouth as needed for mild constipation or moderate constipation.) 14 each 0   senna-docusate (SENOKOT-S) 8.6-50 MG tablet Take 2 tablets by mouth at bedtime as needed (constipation).      tamsulosin  (FLOMAX ) 0.4 MG CAPS  capsule Take 0.4 mg by mouth every 12 (twelve) hours.     gabapentin  (NEURONTIN ) 400 MG capsule Take 1 capsule (400 mg total) by mouth 3 (three) times daily. 120 capsule 2   imipramine  (TOFRANIL -PM) 150 MG capsule TAKE 1 CAPSULE BY MOUTH EVERYDAY AT BEDTIME 90 capsule 1   zonisamide  (ZONEGRAN ) 100 MG capsule TAKE 3 CAPSULES BY MOUTH EVERY DAY 30 capsule 0   nitroGLYCERIN  (NITROSTAT ) 0.4 MG SL tablet Place 1 tablet (0.4 mg total) under the tongue every 5 (five) minutes as needed for chest pain. (Patient not taking: Reported on 08/08/2024) 25 tablet 6   No facility-administered medications prior to visit.    PAST MEDICAL HISTORY: Past Medical History:  Diagnosis  Date   Arthritis    BPH without obstruction/lower urinary tract symptoms    Bronchitis with asthma, acute 11/2011   CAD S/P percutaneous coronary angioplasty 11/15/2018   Severe 2 vessel obstructive CAD:. Ost-pCx 95% (DES PCI  STENT SYNERGY DES 3.5X28) &p-mCx 100% (DES PCI STENT SYNERGY DES 2.25X32). pRCA 90%, mRCA 85% & rPDA 90%.dLAD ~40%.  EF 55 to 60%.  Moderately elevated LVEDP.  No R WMA.  Plan staged PCI to 3 RCA-PDA lesions 4/1   Depression    denies   Diabetes mellitus without complication (HCC)    Type II- Diet controlled   Frequency of urination    URINATION AT NIGHT, PAINFUL SLOW STREAM    GERD (gastroesophageal reflux disease)    occ   Hyperlipidemia    Neuromuscular disorder (HCC)    DIZZINESS   PERIPHERAL NEUROPATHY    Neuropathy    No pertinent past medical history    RINGING RIGHT EAR   NSTEMI (non-ST elevated myocardial infarction) (HCC) 11/13/2018   Recurrent upper respiratory infection (URI)    SOB CURRENT COLD   Shortness of breath    d/t cold 12/02/11    PAST SURGICAL HISTORY: Past Surgical History:  Procedure Laterality Date   BACK SURGERY     BACK SURGERY  04/24/2022   Dr. Gillie   CARDIAC CATHETERIZATION     CORONARY STENT INTERVENTION N/A 11/15/2018   Procedure: CORONARY STENT  INTERVENTION;  Surgeon: Jordan, Peter M, MD;  Location: [facility] INVASIVE CV LAB;;  Ost-pCx 95% (DES PCI  STENT SYNERGY DES 3.5X28) &p-mCx 100% (DES PCI STENT SYNERGY DES 2.25X32)   CORONARY STENT INTERVENTION N/A 11/16/2018   Procedure: CORONARY STENT INTERVENTION;  Surgeon: Claudene Victory ORN, MD;  Location: [facility] INVASIVE CV LAB;  Service: Cardiovascular;  Laterality: N/A;   FINGER SURGERY     RIGHT MIDDLE FINGER   HERNIA REPAIR     UMBILICAL HERNIA 2011   LEFT HEART CATH AND CORONARY ANGIOGRAPHY N/A 11/15/2018   Procedure: LEFT HEART CATH AND CORONARY ANGIOGRAPHY;  Surgeon: Jordan, Peter M, MD;  Location: [facility] INVASIVE CV LAB;; Severe 2 vessel obstructive CAD:. Ost-pCx 95% (DES PCI) &p-mCx 100% (DES PCI). pRCA 90%, mRCA 85% & rPDA 90%.dLAD ~40%.  EF 55 to 60%.  Moderately elevated LVEDP.  No R WMA.  -- Plan staged PCI to 3 RCA-PDA lesions 4/1   LUMBAR LAMINECTOMY/DECOMPRESSION MICRODISCECTOMY  12/22/2011   Procedure: LUMBAR LAMINECTOMY/DECOMPRESSION MICRODISCECTOMY 1 LEVEL;  Surgeon: Fairy Levels, MD;  Location: [facility] NEURO ORS;  Service: Neurosurgery;  Laterality: N/A;  Thoracic ten-eleven laminectomy   LUMBAR LAMINECTOMY/DECOMPRESSION MICRODISCECTOMY Right 05/01/2014   Procedure: Right Lumbar three-four Microdiskectomy;  Surgeon: Fairy Levels, MD;  Location: [facility] NEURO OR[facility];  Service: Neurosurgery;  Laterality: Right;   LUMBAR LAMINECTOMY/DECOMPRESSION MICRODISCECTOMY Right 11/08/2014   Procedure: Right L4-5 L5-S1 Laminectomy;  Surgeon: Fairy Levels, MD;  Location: [facility] NEURO ORS;  Service: Neurosurgery;  Laterality: Right;  Right L4-5 L5-S1 Laminectomy   MULTIPLE TOOTH EXTRACTIONS      FAMILY HISTORY: Family History  Problem Relation Age of Onset   Cancer Mother    Stroke Father    Neuropathy Brother     SOCIAL HISTORY: Social History   Socioeconomic History   Marital status: Married    Spouse name: [name]   Number of children: 3   Years of education: 12   Highest education level: Not on file   Occupational History   Occupation: Retired  Tobacco Use   Smoking status: Former  Current packs/day: 0.00    Average packs/day: 2.0 packs/day for 15.0 years (30.0 ttl pk-yrs)    Types: Cigarettes    Start date: 04/24/1969    Quit date: 04/24/1984    Years since quitting: 40.3   Smokeless tobacco: Never  Vaping Use   Vaping status: Never Used  Substance and Sexual Activity   Alcohol use: Yes    Comment: occ   Drug use: No   Sexual activity: Not on file  Other Topics Concern   Not on file  Social History Narrative   Patient lives at home [name] his wife.    Patient has 3 adult children and 1 step son.    Patient has a high school education.    Patient is retired.    [employer]    Social Drivers of Health   Tobacco Use: Medium Risk (08/08/2024)   Patient History    Smoking Tobacco Use: Former    Smokeless Tobacco Use: Never    Passive Exposure: Not on Actuary Strain: Not on file  Food Insecurity: Not on file  Transportation Needs: Not on file  Physical Activity: Not on file  Stress: Not on file  Social Connections: Not on file  Intimate Partner Violence: Not on file  Depression (PHQ2-9): Low Risk (08/24/2022)   Depression (PHQ2-9)    PHQ-2 Score: 0  Alcohol Screen: Not on file  Housing: Not on file  Utilities: Not on file  Health Literacy: Not on file   PHYSICAL EXAM  Vitals:   08/08/24 1400  BP: 101/66  Weight: 200 lb (90.7 kg)  Height: 5' 8 (1.727 m)   Body mass index is 30.41 kg/m.  Generalized: Well developed, in no acute distress  Neurological examination  Mentation: Alert oriented to time, place, history taking. Follows all commands speech and language fluent Cranial nerve II-XII: Pupils were equal round reactive to light. Extraocular movements were full, visual field were full on confrontational test. Facial sensation and strength were normal. Head turning and shoulder shrug  were normal and symmetric. Motor: The motor testing reveals 5  over 5 strength of all 4 extremities. Good symmetric motor tone is noted throughout.  Sensory: Decreased soft touch, pinprick sensation to mid shin bilaterally. Decreased hair growth to mid shin Coordination: Cerebellar testing reveals good finger-nose-finger and heel-to-shin bilaterally.  Gait and station: Gait is normal Reflexes: Decreased to absent at the knees   DIAGNOSTIC DATA (LABS, IMAGING, TESTING) - I reviewed patient records, labs, notes, testing and imaging myself where available.  Lab Results  Component Value Date   WBC 6.8 04/07/2024   HGB 14.9 04/07/2024   HCT 44.9 04/07/2024   MCV 96.4 04/07/2024   PLT 229 04/07/2024      Component Value Date/Time   NA 136 04/07/2024 1942   NA 140 12/16/2023 1017   K 4.1 04/07/2024 1942   CL 107 04/07/2024 1942   CO2 19 (L) 04/07/2024 1942   GLUCOSE 120 (H) 04/07/2024 1942   BUN 19 04/07/2024 1942   BUN 23 12/16/2023 1017   CREATININE 1.19 04/07/2024 1942   CALCIUM  8.8 (L) 04/07/2024 1942   PROT 6.5 04/07/2024 1942   PROT 6.5 12/16/2023 1017   ALBUMIN  3.6 04/07/2024 1942   ALBUMIN  4.1 12/16/2023 1017   AST 31 04/07/2024 1942   ALT 44 04/07/2024 1942   ALKPHOS 82 04/07/2024 1942   BILITOT 1.5 (H) 04/07/2024 1942   BILITOT 0.7 12/16/2023 1017   GFRNONAA >60 04/07/2024 1942  GFRAA >60 12/03/2019 0417   Lab Results  Component Value Date   CHOL 256 (H) 12/16/2023   HDL 35 (L) 12/16/2023   LDLCALC 162 (H) 12/16/2023   TRIG 310 (H) 12/16/2023   CHOLHDL 7.3 (H) 12/16/2023   Lab Results  Component Value Date   HGBA1C 7.4 (H) 12/16/2023   No results found for: VITAMINB12 No results found for: TSH  ASSESSMENT AND PLAN 73 y.o. year old male  has a past medical history of Arthritis, BPH without obstruction/lower urinary tract symptoms, Bronchitis with asthma, acute (11/2011), CAD S/P percutaneous coronary angioplasty (11/15/2018), Depression, Diabetes mellitus without complication (HCC), Frequency of urination, GERD  (gastroesophageal reflux disease), Hyperlipidemia, Neuromuscular disorder (HCC), Neuropathy, No pertinent past medical history, NSTEMI (non-ST elevated myocardial infarction) (HCC) (11/13/2018), Recurrent upper respiratory infection (URI), and Shortness of breath. here with:  1.  Peripheral neuropathy 2.  Chronic low back pain, spinal stimulator  -Previously referred to pain management, has returned back for management of medications, but is a lower dose at this point - Continue gabapentin  400 mg up to 3 times daily, trying taking at 2 PM, 7 PM, midnight for RLS symptoms - Will continue long-term Zonegran  300 mg at bedtime, Tofranil  150 mg at bedtime - Would like for him to see [name] in 6 months for evaluation of neuropathy, previously felt to be hereditary neuropathy   Meds ordered this encounter  Medications   gabapentin  (NEURONTIN ) 400 MG capsule    Sig: Take 1 capsule (400 mg total) by mouth 3 (three) times daily.    Dispense:  90 capsule    Refill:  5   zonisamide  (ZONEGRAN ) 100 MG capsule    Sig: Take 3 capsules (300 mg total) by mouth daily.    Dispense:  90 capsule    Refill:  5   imipramine  (TOFRANIL -PM) 150 MG capsule    Sig: Take 1 capsule (150 mg total) by mouth at bedtime.    Dispense:  90 capsule    Refill:  1   Lauraine Born, Potter, DNP 08/08/2024, 2:49 PM [facility] [address] [place], [place] [address] [phone]   "

## 2024-08-08 NOTE — Patient Instructions (Signed)
 Great to see you today! Continue current medications, try adjusting the time of gabapentin  to something like, 2 PM, 7 PM, 12 AM

## 2024-08-15 NOTE — Progress Notes (Signed)
 Chart reviewed, agree above plan ?

## 2025-02-13 ENCOUNTER — Ambulatory Visit: Admitting: Neurology
